# Patient Record
Sex: Male | Born: 1955
Health system: Southern US, Community
[De-identification: ages and names within clinical notes are randomized; demographics above are authoritative.]

## PROBLEM LIST (undated history)

## (undated) DIAGNOSIS — F319 Bipolar disorder, unspecified: Secondary | ICD-10-CM

## (undated) DIAGNOSIS — I2510 Atherosclerotic heart disease of native coronary artery without angina pectoris: Secondary | ICD-10-CM

## (undated) DIAGNOSIS — B958 Unspecified staphylococcus as the cause of diseases classified elsewhere: Secondary | ICD-10-CM

## (undated) DIAGNOSIS — M1990 Unspecified osteoarthritis, unspecified site: Secondary | ICD-10-CM

## (undated) DIAGNOSIS — B3323 Viral pericarditis: Secondary | ICD-10-CM

## (undated) DIAGNOSIS — F419 Anxiety disorder, unspecified: Secondary | ICD-10-CM

## (undated) DIAGNOSIS — B1920 Unspecified viral hepatitis C without hepatic coma: Secondary | ICD-10-CM

## (undated) DIAGNOSIS — J852 Abscess of lung without pneumonia: Secondary | ICD-10-CM

## (undated) DIAGNOSIS — I712 Thoracic aortic aneurysm, without rupture: Secondary | ICD-10-CM

## (undated) DIAGNOSIS — J189 Pneumonia, unspecified organism: Secondary | ICD-10-CM

## (undated) DIAGNOSIS — J869 Pyothorax without fistula: Secondary | ICD-10-CM

## (undated) DIAGNOSIS — F988 Other specified behavioral and emotional disorders with onset usually occurring in childhood and adolescence: Secondary | ICD-10-CM

## (undated) DIAGNOSIS — I10 Essential (primary) hypertension: Secondary | ICD-10-CM

## (undated) DIAGNOSIS — N412 Abscess of prostate: Secondary | ICD-10-CM

## (undated) DIAGNOSIS — I7121 Aneurysm of the ascending aorta, without rupture: Secondary | ICD-10-CM

## (undated) DIAGNOSIS — N429 Disorder of prostate, unspecified: Secondary | ICD-10-CM

## (undated) HISTORY — DX: Thoracic aortic aneurysm, without rupture: I71.2

## (undated) HISTORY — DX: Aneurysm of the ascending aorta, without rupture: I71.21

## (undated) HISTORY — DX: Atherosclerotic heart disease of native coronary artery without angina pectoris: I25.10

## (undated) HISTORY — DX: Unspecified viral hepatitis C without hepatic coma: B19.20

## (undated) HISTORY — DX: Viral pericarditis: B33.23

## (undated) HISTORY — DX: Abscess of lung without pneumonia: J85.2

## (undated) HISTORY — DX: Pyothorax without fistula: J86.9

## (undated) HISTORY — DX: Unspecified staphylococcus as the cause of diseases classified elsewhere: B95.8

## (undated) HISTORY — DX: Essential (primary) hypertension: I10

## (undated) HISTORY — DX: Bipolar disorder, unspecified: F31.9

## (undated) HISTORY — DX: Other specified behavioral and emotional disorders with onset usually occurring in childhood and adolescence: F98.8

## (undated) HISTORY — PX: HIP RESURFACING: SHX1760

## (undated) HISTORY — PX: LUNG SURGERY: SHX703

---

## 1998-02-14 ENCOUNTER — Other Ambulatory Visit: Admission: RE | Admit: 1998-02-14 | Discharge: 1998-02-14 | Payer: Self-pay | Admitting: Gastroenterology

## 1998-02-22 ENCOUNTER — Ambulatory Visit (HOSPITAL_COMMUNITY): Admission: RE | Admit: 1998-02-22 | Discharge: 1998-02-22 | Payer: Self-pay | Admitting: Gastroenterology

## 1998-06-04 ENCOUNTER — Emergency Department (HOSPITAL_COMMUNITY): Admission: EM | Admit: 1998-06-04 | Discharge: 1998-06-04 | Payer: Self-pay

## 1998-11-30 ENCOUNTER — Ambulatory Visit (HOSPITAL_COMMUNITY): Admission: RE | Admit: 1998-11-30 | Discharge: 1998-11-30 | Payer: Self-pay | Admitting: Gastroenterology

## 2000-10-20 ENCOUNTER — Ambulatory Visit (HOSPITAL_BASED_OUTPATIENT_CLINIC_OR_DEPARTMENT_OTHER): Admission: RE | Admit: 2000-10-20 | Discharge: 2000-10-20 | Payer: Self-pay | Admitting: Orthopedic Surgery

## 2000-10-20 ENCOUNTER — Encounter (INDEPENDENT_AMBULATORY_CARE_PROVIDER_SITE_OTHER): Payer: Self-pay | Admitting: *Deleted

## 2001-04-16 ENCOUNTER — Ambulatory Visit (HOSPITAL_COMMUNITY): Admission: RE | Admit: 2001-04-16 | Discharge: 2001-04-16 | Payer: Self-pay | Admitting: *Deleted

## 2001-04-16 ENCOUNTER — Encounter: Payer: Self-pay | Admitting: *Deleted

## 2001-06-08 ENCOUNTER — Encounter: Payer: Self-pay | Admitting: *Deleted

## 2001-06-08 ENCOUNTER — Ambulatory Visit (HOSPITAL_COMMUNITY): Admission: RE | Admit: 2001-06-08 | Discharge: 2001-06-08 | Payer: Self-pay | Admitting: *Deleted

## 2002-10-13 ENCOUNTER — Ambulatory Visit (HOSPITAL_COMMUNITY): Admission: RE | Admit: 2002-10-13 | Discharge: 2002-10-13 | Payer: Self-pay | Admitting: *Deleted

## 2002-10-13 ENCOUNTER — Encounter: Payer: Self-pay | Admitting: *Deleted

## 2002-10-24 ENCOUNTER — Encounter: Payer: Self-pay | Admitting: *Deleted

## 2002-10-24 ENCOUNTER — Ambulatory Visit (HOSPITAL_COMMUNITY): Admission: RE | Admit: 2002-10-24 | Discharge: 2002-10-24 | Payer: Self-pay | Admitting: *Deleted

## 2003-07-29 HISTORY — PX: ELBOW SURGERY: SHX618

## 2004-07-28 HISTORY — PX: FOOT NEUROMA SURGERY: SHX646

## 2004-08-01 ENCOUNTER — Ambulatory Visit (HOSPITAL_BASED_OUTPATIENT_CLINIC_OR_DEPARTMENT_OTHER): Admission: RE | Admit: 2004-08-01 | Discharge: 2004-08-01 | Payer: Self-pay | Admitting: Orthopedic Surgery

## 2004-08-01 ENCOUNTER — Ambulatory Visit (HOSPITAL_COMMUNITY): Admission: RE | Admit: 2004-08-01 | Discharge: 2004-08-01 | Payer: Self-pay | Admitting: Orthopedic Surgery

## 2004-08-01 ENCOUNTER — Encounter (INDEPENDENT_AMBULATORY_CARE_PROVIDER_SITE_OTHER): Payer: Self-pay | Admitting: *Deleted

## 2005-04-23 ENCOUNTER — Ambulatory Visit (HOSPITAL_COMMUNITY): Admission: RE | Admit: 2005-04-23 | Discharge: 2005-04-23 | Payer: Self-pay | Admitting: Orthopedic Surgery

## 2005-11-26 ENCOUNTER — Ambulatory Visit (HOSPITAL_COMMUNITY): Admission: RE | Admit: 2005-11-26 | Discharge: 2005-11-26 | Payer: Self-pay | Admitting: Orthopedic Surgery

## 2005-12-30 ENCOUNTER — Encounter (INDEPENDENT_AMBULATORY_CARE_PROVIDER_SITE_OTHER): Payer: Self-pay | Admitting: Specialist

## 2005-12-30 ENCOUNTER — Ambulatory Visit (HOSPITAL_BASED_OUTPATIENT_CLINIC_OR_DEPARTMENT_OTHER): Admission: RE | Admit: 2005-12-30 | Discharge: 2005-12-30 | Payer: Self-pay | Admitting: Orthopaedic Surgery

## 2007-08-03 ENCOUNTER — Ambulatory Visit (HOSPITAL_COMMUNITY): Admission: RE | Admit: 2007-08-03 | Discharge: 2007-08-03 | Payer: Self-pay | Admitting: Orthopaedic Surgery

## 2007-08-16 ENCOUNTER — Ambulatory Visit (HOSPITAL_COMMUNITY): Admission: RE | Admit: 2007-08-16 | Discharge: 2007-08-16 | Payer: Self-pay | Admitting: Orthopaedic Surgery

## 2007-09-22 ENCOUNTER — Ambulatory Visit (HOSPITAL_COMMUNITY): Admission: RE | Admit: 2007-09-22 | Discharge: 2007-09-22 | Payer: Self-pay | Admitting: Gastroenterology

## 2007-10-15 ENCOUNTER — Encounter: Admission: RE | Admit: 2007-10-15 | Discharge: 2007-10-15 | Payer: Self-pay | Admitting: Orthopedic Surgery

## 2008-04-07 ENCOUNTER — Emergency Department (HOSPITAL_COMMUNITY): Admission: EM | Admit: 2008-04-07 | Discharge: 2008-04-07 | Payer: Self-pay | Admitting: Family Medicine

## 2008-09-07 ENCOUNTER — Encounter: Admission: RE | Admit: 2008-09-07 | Discharge: 2008-09-07 | Payer: Self-pay | Admitting: Orthopedic Surgery

## 2009-11-05 ENCOUNTER — Ambulatory Visit (HOSPITAL_COMMUNITY): Admission: RE | Admit: 2009-11-05 | Discharge: 2009-11-05 | Payer: Self-pay | Admitting: Orthopaedic Surgery

## 2010-05-27 ENCOUNTER — Encounter: Admission: RE | Admit: 2010-05-27 | Discharge: 2010-05-27 | Payer: Self-pay | Admitting: Family Medicine

## 2010-06-25 ENCOUNTER — Encounter: Admission: RE | Admit: 2010-06-25 | Discharge: 2010-06-25 | Payer: Self-pay | Admitting: Obstetrics & Gynecology

## 2010-08-18 ENCOUNTER — Encounter: Payer: Self-pay | Admitting: Orthopaedic Surgery

## 2010-09-24 ENCOUNTER — Other Ambulatory Visit: Payer: Self-pay | Admitting: Family Medicine

## 2010-09-24 ENCOUNTER — Ambulatory Visit
Admission: RE | Admit: 2010-09-24 | Discharge: 2010-09-24 | Disposition: A | Discharge status: Home / Self Care | Payer: Commercial Managed Care - PPO | Source: Ambulatory Visit | Attending: Family Medicine | Admitting: Family Medicine

## 2010-09-24 DIAGNOSIS — R05 Cough: Secondary | ICD-10-CM

## 2010-09-24 DIAGNOSIS — R0602 Shortness of breath: Secondary | ICD-10-CM

## 2010-09-24 DIAGNOSIS — R059 Cough, unspecified: Secondary | ICD-10-CM

## 2010-09-25 ENCOUNTER — Ambulatory Visit
Admission: RE | Admit: 2010-09-25 | Discharge: 2010-09-25 | Disposition: A | Discharge status: Home / Self Care | Payer: Commercial Managed Care - PPO | Source: Ambulatory Visit | Attending: Family Medicine | Admitting: Family Medicine

## 2010-09-25 ENCOUNTER — Other Ambulatory Visit: Payer: Self-pay | Admitting: Family Medicine

## 2010-09-25 DIAGNOSIS — R911 Solitary pulmonary nodule: Secondary | ICD-10-CM

## 2010-10-14 ENCOUNTER — Encounter: Payer: Self-pay | Admitting: Pulmonary Disease

## 2010-10-15 ENCOUNTER — Encounter: Payer: Self-pay | Admitting: Pulmonary Disease

## 2010-10-15 ENCOUNTER — Ambulatory Visit (INDEPENDENT_AMBULATORY_CARE_PROVIDER_SITE_OTHER): Payer: Commercial Managed Care - PPO | Admitting: Pulmonary Disease

## 2010-10-15 DIAGNOSIS — J189 Pneumonia, unspecified organism: Secondary | ICD-10-CM

## 2010-10-15 DIAGNOSIS — J90 Pleural effusion, not elsewhere classified: Secondary | ICD-10-CM | POA: Insufficient documentation

## 2010-10-15 NOTE — Assessment & Plan Note (Signed)
The pt has an unusual appearing asdz abutting major fissure in superior segment RLL on ct chest.  I suspect this was pna, and will be further evaluated after a course of abx with an upcoming ct chest.  He has minimal cough or chest congestion currently, but does have a low grade temp today.

## 2010-10-15 NOTE — Patient Instructions (Signed)
Will schedule for a drainage procedure for the fluid in your right chest.  Will do scan of your chest one day after this. Will arrange followup  Visit with me to review all of this when results available

## 2010-10-15 NOTE — Assessment & Plan Note (Addendum)
The pt has a persistent right effusion that is most likely loculated and parapneumonic in nature.  He has finished up a course of levaquin a few weeks ago.  He will need u/s guided thoracentesis with labs, culture, and cytology sent.  Would like to drain the space as much as possible, then do f/u ct chest the next day to see the results.  Hopefully, this will not come to [facility].

## 2010-10-15 NOTE — Progress Notes (Signed)
Subjective:    Patient ID: Paul Ray, male    DOB: 1955/08/31, 55 y.o.   MRN: [id number]  HPI The pt is a 54y/o male who I have been asked to see for a recent pna complicated by a right pleural effusion.  He actually had an episode of ?pna the end of last year, and was treated with 3 rounds of abx before feeling well.  Approx. One month ago, he developed a dry cough with right sided chest discomfort, but no f/c/s or purulence.  He had mild sob.  He was found to have by cxr and ct chest asdz in the sup segment of RLL, along with a moderately large right effusion.  He was treated with a course of levaquin and feels about 50% better.  He still has some right sided chest pain, but minimal cough and congestion.  He denies f/c/s.  He did have recent cxr at [facility] which showed what appears to be an enlarged right effusion that is loculated.    Past Medical History  Diagnosis Date  . Hepatitis C   . ADD (attention deficit disorder)   . Bipolar depression   . Hemorrhoids     Family History  Problem Relation Age of Onset  . COPD Mother   . Heart disease Mother   . Hypertension Mother   . Coronary artery disease Father   . Dementia Father   . Heart failure Father   . Prostate cancer Paternal Grandfather     also had bone cancer  . Breast cancer Maternal Grandmother   . Cancer Paternal Grandmother     unsure what kind    History   Social History  . Marital Status: Married    Spouse Name: N/A    Number of Children: 2  . Years of Education: N/A   Occupational History  . carpenter    Social History Main Topics  . Smoking status: Never Smoker   . Smokeless tobacco: Not on file  . Alcohol Use: Not on file  . Drug Use: Not on file  . Sexually Active: Not on file   Other Topics Concern  . Not on file   Social History Narrative   2 children: Fleet Contras and [name]    No Known Allergies  Outpatient Encounter Prescriptions as of 10/15/2010  Medication Sig Dispense Refill  . b complex  vitamins tablet Take 1 tablet by mouth daily.        . divalproex (DEPAKOTE) 500 MG EC tablet Take 500 mg by mouth 4 (four) times daily.        . Glucosamine-Chondroit-Vit C-Mn (GLUCOSAMINE CHONDROITIN COMPLX) CAPS Take 2 capsules by mouth daily.        Marland Kitchen lamoTRIgine (LAMICTAL) 100 MG tablet Take 100 mg by mouth daily.        . Multiple Vitamin (MULTIVITAMIN) capsule Take 1 capsule by mouth daily.        . OMEGA-3 KRILL OIL 300 MG CAPS Take 1 capsule by mouth daily.        . traMADol (ULTRAM) 50 MG tablet Take 1 to 2 tabs three to four times a day for pain      . DISCONTD: lisdexamfetamine (VYVANSE) 60 MG capsule Take 60 mg by mouth every morning.        Marland Kitchen DISCONTD: naproxen sodium (ANAPROX) 220 MG tablet 2 1/2 tabs every 12 hours with food as needed for back pain            Review  of Systems Patient complains of productive cough, non productive cough, ear ache, and joint stiffness or pain  Patient denies shortness of breath with activity, shortness of breath at rest, coughing up blood, chest pain, irregular heartbeats, acid heartburn, indigestion, loss of appetite, weight change, abdominal pain, difficulty swallowing, sore throat, tooth/dental problems, headaches, nasal congestion, sneezing, itching, anxiety, depression, hand or feet swelling, rash, or change in color of mucus.     Objective:   Physical Exam  Constitutional: He is oriented to person, place, and time. He appears well-developed and well-nourished.  HENT:  Mouth/Throat: Oropharynx is clear and moist.       Nares with deviated septum to left, but no purulence  Eyes: EOM are normal. Pupils are equal, round, and reactive to light.  Neck: Neck supple. No JVD present. No thyromegaly present.  Cardiovascular: Normal rate, regular rhythm, normal heart sounds and intact distal pulses.  Exam reveals no gallop and no friction rub.   No murmur heard. Pulmonary/Chest:       Crackles right base, decreased bs 1/2 up on right with no e  to a changes. +dullness to percussion.   Left lung clear  Abdominal: Soft. Bowel sounds are normal. There is no tenderness.  Musculoskeletal: He exhibits no edema.  Lymphadenopathy:    He has no cervical adenopathy.  Neurological: He is alert and oriented to person, place, and time.  Skin:       No cyanosis           Assessment & Plan:  Pleural effusion The pt has a persistent right effusion that is most likely loculated and parapneumonic in nature.  He has finished up a course of levaquin a few weeks ago.  He will need u/s guided thoracentesis with labs, culture, and cytology sent.  Would like to drain the space as much as possible, then do f/u ct chest the next day to see the results.  Hopefully, this will not come to [facility].    Pneumonia, organism unspecified The pt has an unusual appearing asdz abutting major fissure in superior segment RLL on ct chest.  I suspect this was pna, and will be further evaluated after a course of abx with an upcoming ct chest.  He has minimal cough or chest congestion currently, but does have a low grade temp today.

## 2010-10-15 NOTE — Progress Notes (Deleted)
  Subjective:    Patient ID: Paul Ray, male    DOB: [birth year], 55 y.o.   MRN: [id number]  HPI    Review of Systems Patient complains of productive cough, non productive cough, ear ache, and joint stiffness or pain Patient denies shortness of breath with activity, shortness of breath at rest, coughing up blood, chest pain, irregular heartbeats, acid heartburn, indigestion, loss of appetite, weight change, abdominal pain, difficulty swallowing, sore throat, tooth/dental problems, headaches, nasal congestion, sneezing, itching, anxiety, depression, hand or feet swelling, rash, or change in color of mucus.    Objective:   Physical Exam        Assessment & Plan:

## 2010-10-17 ENCOUNTER — Ambulatory Visit (HOSPITAL_COMMUNITY)
Admission: RE | Admit: 2010-10-17 | Discharge: 2010-10-17 | Disposition: A | Discharge status: Home / Self Care | Payer: 59 | Source: Ambulatory Visit | Attending: Pulmonary Disease | Admitting: Pulmonary Disease

## 2010-10-17 ENCOUNTER — Other Ambulatory Visit: Payer: Self-pay | Admitting: Interventional Radiology

## 2010-10-17 ENCOUNTER — Other Ambulatory Visit: Payer: Self-pay | Admitting: Pulmonary Disease

## 2010-10-17 ENCOUNTER — Telehealth: Payer: Self-pay | Admitting: Pulmonary Disease

## 2010-10-17 DIAGNOSIS — R0602 Shortness of breath: Secondary | ICD-10-CM | POA: Insufficient documentation

## 2010-10-17 DIAGNOSIS — Z9889 Other specified postprocedural states: Secondary | ICD-10-CM

## 2010-10-17 DIAGNOSIS — R222 Localized swelling, mass and lump, trunk: Secondary | ICD-10-CM | POA: Insufficient documentation

## 2010-10-17 DIAGNOSIS — J90 Pleural effusion, not elsewhere classified: Secondary | ICD-10-CM | POA: Insufficient documentation

## 2010-10-17 LAB — LACTATE DEHYDROGENASE, PLEURAL OR PERITONEAL FLUID: LD, Fluid: 641 U/L — ABNORMAL HIGH (ref 3–23)

## 2010-10-17 LAB — PROTEIN, BODY FLUID: Total protein, fluid: 4.7 g/dL

## 2010-10-17 LAB — PATHOLOGIST SMEAR REVIEW

## 2010-10-17 LAB — BODY FLUID CELL COUNT WITH DIFFERENTIAL
Eos, Fluid: 66 %
Lymphs, Fluid: 14 %
Monocyte-Macrophage-Serous Fluid: 8 % — ABNORMAL LOW (ref 50–90)
Neutrophil Count, Fluid: 12 % (ref 0–25)
Total Nucleated Cell Count, Fluid: 11163 cu mm — ABNORMAL HIGH (ref 0–1000)

## 2010-10-17 LAB — GLUCOSE, SEROUS FLUID: Glucose, Fluid: 82 mg/dL

## 2010-10-17 NOTE — Telephone Encounter (Signed)
Called and spoke with pt.  Pt states he had thoracentesis done today and is requesting these results.  Informed pt KC out of office this afternoon and will return tomorrow. Will forward message to [facility] to address.

## 2010-10-18 ENCOUNTER — Telehealth: Payer: Self-pay | Admitting: Pulmonary Disease

## 2010-10-18 ENCOUNTER — Ambulatory Visit (HOSPITAL_COMMUNITY)
Admission: RE | Admit: 2010-10-18 | Discharge: 2010-10-18 | Disposition: A | Discharge status: Home / Self Care | Payer: 59 | Source: Ambulatory Visit | Attending: Pulmonary Disease | Admitting: Pulmonary Disease

## 2010-10-18 DIAGNOSIS — J90 Pleural effusion, not elsewhere classified: Secondary | ICD-10-CM | POA: Insufficient documentation

## 2010-10-18 DIAGNOSIS — R222 Localized swelling, mass and lump, trunk: Secondary | ICD-10-CM | POA: Insufficient documentation

## 2010-10-18 NOTE — Telephone Encounter (Signed)
Spoke with Rebecka Apley from [facility].  She states she is needing the order signed by [facility] for pt to get his CT scan done.  Discussed this with Bjorn Loser.  She refaxed the order back to [facility] with KC stamped signature.

## 2010-10-18 NOTE — Telephone Encounter (Signed)
Paul Ray, pt is requesting results of his ct scan.  Do you have these results?  Please advise. Thanks

## 2010-10-18 NOTE — Telephone Encounter (Signed)
Pt called again, states that's he still hurting, coughing a lot, chest hurts, coughing up brownish phlegm since last night also requests ct results from yesterday.

## 2010-10-18 NOTE — Telephone Encounter (Signed)
KC, pt is calling requesting results of his ct scan that was done today.  Pt would also like results of thoracentesis.  Please advise.  Thank you.

## 2010-10-18 NOTE — Telephone Encounter (Signed)
Already done

## 2010-10-18 NOTE — Telephone Encounter (Signed)
Done

## 2010-10-21 LAB — BODY FLUID CULTURE: Culture: NO GROWTH

## 2010-10-22 ENCOUNTER — Ambulatory Visit (INDEPENDENT_AMBULATORY_CARE_PROVIDER_SITE_OTHER)
Admission: RE | Admit: 2010-10-22 | Discharge: 2010-10-22 | Disposition: A | Discharge status: Home / Self Care | Payer: Commercial Managed Care - PPO | Source: Ambulatory Visit | Attending: Pulmonary Disease | Admitting: Pulmonary Disease

## 2010-10-22 ENCOUNTER — Ambulatory Visit (INDEPENDENT_AMBULATORY_CARE_PROVIDER_SITE_OTHER): Payer: Commercial Managed Care - PPO | Admitting: Pulmonary Disease

## 2010-10-22 ENCOUNTER — Ambulatory Visit: Payer: Commercial Managed Care - PPO | Admitting: Critical Care Medicine

## 2010-10-22 ENCOUNTER — Telehealth: Payer: Self-pay | Admitting: Pulmonary Disease

## 2010-10-22 ENCOUNTER — Encounter: Payer: Self-pay | Admitting: Pulmonary Disease

## 2010-10-22 DIAGNOSIS — J90 Pleural effusion, not elsewhere classified: Secondary | ICD-10-CM

## 2010-10-22 DIAGNOSIS — R918 Other nonspecific abnormal finding of lung field: Secondary | ICD-10-CM | POA: Insufficient documentation

## 2010-10-22 DIAGNOSIS — R222 Localized swelling, mass and lump, trunk: Secondary | ICD-10-CM

## 2010-10-22 MED ORDER — AMOXICILLIN-POT CLAVULANATE 875-125 MG PO TABS: 1.0000 | ORAL_TABLET | Freq: Two times a day (BID) | ORAL | Status: DC

## 2010-10-22 MED ORDER — HYDROCODONE-ACETAMINOPHEN 5-500 MG PO TABS: 1.0000 | ORAL_TABLET | Freq: Four times a day (QID) | ORAL | Status: DC | PRN

## 2010-10-22 NOTE — Patient Instructions (Signed)
Take augmentin as directed until next visit vicodin for pain as needed Please call if not improving over the next 7days followup with me in 3 weeks.

## 2010-10-22 NOTE — Telephone Encounter (Signed)
Called and spoke with pt and she states he has had a terrible cough all night w/ brown to yellow phlem. Pt states his chest has been hurting him all night as well. Pt is coming in to see PW at 2:00 to be evaluated today. Pt states he will call if he can't make it in today. Pt also wanting his ct results and states he is waiting to here back on the cultures that was done on the fluid that was drawn off. Please advise Dr. Shelle Iron thanks  Carver Fila, CMA

## 2010-10-22 NOTE — Telephone Encounter (Signed)
Patient stated that if he isn't home he can be reached on his cell [phone]. He also wanted to know if we had the results of his CT and other test..Susan Romeo Apple

## 2010-10-22 NOTE — Assessment & Plan Note (Signed)
The pt has a persistent mass like density on chest ct that is parenchymal, but next to fissure and chest wall.  I suspect this is a lung abscess given his persistent purulent mucus and pleural fluid findings, but I cannot 100% exclude a malignancy.  Would like to treat with augmentin for next 3 weeks to see if things improve.

## 2010-10-22 NOTE — Telephone Encounter (Signed)
Cancelled apt with pw and pt is coming in to see KC at 2:00 instead  Carver Fila, CMA

## 2010-10-22 NOTE — Progress Notes (Signed)
  Subjective:    Patient ID: Paul Ray, male    DOB: 07/17/1956, 55 y.o.   MRN: [id number]  HPI The pt comes in today for f/u of his pleural effusion with abnormal parenchymal density as well.  He is s/p one liter thoracentesis with good drainage, and fluid was very inflammatory with negative culture and path.  His f/u ct chest showed minimal remaining fluid, but no change in his parenchymal density.  He began to have more pleuritic cp after his thoracentesis, but his cxr did not show ptx.  He has continued to have discomfort, most likely due to inflammatory pleuritis after draining "buffer".  He has not had fever, but has had subjective chills and sweats.  He is coughing some, with discolored mucus.  He thinks he is getting more congested.    Review of Systems  Constitutional: Positive for chills and appetite change. Negative for fever and unexpected weight change.  HENT: Positive for congestion. Negative for ear pain, sore throat, rhinorrhea, sneezing, trouble swallowing, dental problem and postnasal drip.   Eyes: Negative for redness.  Respiratory: Positive for cough and shortness of breath. Negative for wheezing.   Cardiovascular: Positive for chest pain. Negative for palpitations and leg swelling.  Gastrointestinal: Negative for nausea, abdominal pain and diarrhea.  Genitourinary: Negative for dysuria.  Musculoskeletal: Positive for joint swelling.  Skin: Negative for rash.  Neurological: Negative for syncope and headaches.  Hematological: Does not bruise/bleed easily.  Psychiatric/Behavioral: Negative for dysphoric mood. The patient is not nervous/anxious.        Objective:   Physical Exam Wd male in nad No purulence or discharge from nares No jvd, LN Chest with decreased bs on right with a few crackles. Cor with rrr No LE edema, no cyanosis  Alert, moves all 4 extrem.       Assessment & Plan:

## 2010-10-22 NOTE — Assessment & Plan Note (Addendum)
The pt is having significant pleuritic pain probably related to an inflammatory pleuritis after thoracentesis.  Will recheck a cxr today, and treat with analgesics for pain control.  His f/u ct chest post thoracentesis showed very little fluid remaining, and will follow for now.

## 2010-10-23 ENCOUNTER — Telehealth: Payer: Self-pay | Admitting: Pulmonary Disease

## 2010-10-23 NOTE — Telephone Encounter (Signed)
Spoke with pt and he states he saw Dr. Shelle Iron yesterday and was given Vicodin 5/500 and pt states that is not even touching his pain. Pt states he cough has gotten progressively worse and he is coughing so bad it is making her chest and back hurt bad. Pt is taking his augmentin twice a day.  Pt also states he is having dry mouth as well. Dr. Shelle Iron please advise thanks  No Known Allergies  Carver Fila, CMA

## 2010-10-23 NOTE — Telephone Encounter (Signed)
Called spoke with patient, informed him of KC's recs as stated above.  Pt states that he does not want to go the hosp, but that his pain is indeed worse since yesterday and the vicodin is not helping.  Pt asking what he can do in the meantime > i advised vicodin and the augmentin as prescribed.  Pt did not seem satisfied with this.  Pt would like to know if the hospital admission would just be an ER visit or if he would have to stay over night.  I advised pt that an actual admission may be in order, but he is requesting KC's thoughts on this and any other recs.  Please advise.

## 2010-10-23 NOTE — Telephone Encounter (Signed)
Let him know the abx should help his infection, and help reduce fluid in his chest.  If he feels his pain is no better with vicodan, may need admission for pain control although the ER may decide to treat him as an outpt.  If he doesn't go to ER, will need a cxr here as I have documented in other notes.

## 2010-10-23 NOTE — Telephone Encounter (Signed)
We have just started him on therapy.  The only other option at this point is hospital admission for IV abx, drainage again of his fluid.  Let me know if he wishes to do this.  He also needs to let us know if worsens.

## 2010-10-23 NOTE — Telephone Encounter (Signed)
Called and spoke with pt.  Pt aware of KC's recs.  Pt states he is going to go to the ER for pain control as he states the vicodin isn't helping with the pain.

## 2010-10-28 ENCOUNTER — Telehealth: Payer: Self-pay | Admitting: Pulmonary Disease

## 2010-10-28 ENCOUNTER — Ambulatory Visit: Payer: Commercial Managed Care - PPO | Admitting: Internal Medicine

## 2010-10-28 NOTE — Telephone Encounter (Signed)
Patient phoned stated that he was returning a call to Triage he can be reached at [phone].Vedia Coffer

## 2010-10-28 NOTE — Telephone Encounter (Signed)
Pt states he is having some rib pain, cough, wheezing, sob. Pt req to see Dr. Shelle Iron but advised pt he is not in the office. Advised pt then he needed to go to the ED to be evaluated but refused bc he states he can't afford to go every time we rec he go. Pt did not go the ED as rec on Friday when pt called in.  Pt is coming in to see MR today at 1:30

## 2010-10-28 NOTE — Telephone Encounter (Signed)
lmomtcb x1 

## 2010-10-31 ENCOUNTER — Encounter: Payer: Self-pay | Admitting: Pulmonary Disease

## 2010-10-31 ENCOUNTER — Ambulatory Visit (INDEPENDENT_AMBULATORY_CARE_PROVIDER_SITE_OTHER)
Admission: RE | Admit: 2010-10-31 | Discharge: 2010-10-31 | Disposition: A | Discharge status: Home / Self Care | Payer: Commercial Managed Care - PPO | Source: Ambulatory Visit | Attending: Pulmonary Disease | Admitting: Pulmonary Disease

## 2010-10-31 ENCOUNTER — Ambulatory Visit (INDEPENDENT_AMBULATORY_CARE_PROVIDER_SITE_OTHER): Payer: Commercial Managed Care - PPO | Admitting: Pulmonary Disease

## 2010-10-31 DIAGNOSIS — R222 Localized swelling, mass and lump, trunk: Secondary | ICD-10-CM

## 2010-10-31 DIAGNOSIS — R918 Other nonspecific abnormal finding of lung field: Secondary | ICD-10-CM

## 2010-10-31 DIAGNOSIS — J90 Pleural effusion, not elsewhere classified: Secondary | ICD-10-CM

## 2010-10-31 NOTE — Assessment & Plan Note (Addendum)
The pt had mild increase in pleural fluid last visit, and the hope was that treatment with abx would help with this.  His effusion is exudative, very inflammatory and needs to be watched very carefully.  Will check cxr today, and if increased, may need to have repeat thoracentesis vs tube thoracostomy.

## 2010-10-31 NOTE — Patient Instructions (Signed)
Continue with antibiotics Will check cxr today ,and will call you this afternoon with results

## 2010-10-31 NOTE — Progress Notes (Signed)
  Subjective:    Patient ID: Paul Ray, male    DOB: Dec 24, 1955, 55 y.o.   MRN: [id number]  HPI The pt comes in today for f/u of his mass like density right lung that is felt to be a lung abscess, as well as right effusion.  At the last visit, he was started on augmentin for possible lung abscess, and a f/u cxr showed some increase in pleural effusion. The decision was made to follow this with the addition of abx, and see how he did.   He is much better since last visit, with decreased chest pain, and decrease in cough and mucus.  He denies any f/c/s. He has minimal purulence in his mucus at this time.    Review of Systems  Constitutional: Positive for appetite change. Negative for fever and unexpected weight change.  HENT: Positive for congestion, sore throat and trouble swallowing. Negative for ear pain, rhinorrhea, sneezing, dental problem and postnasal drip.   Eyes: Negative for redness.  Respiratory: Positive for cough, shortness of breath and wheezing.   Cardiovascular: Positive for chest pain and leg swelling. Negative for palpitations.  Gastrointestinal: Negative for nausea, abdominal pain and diarrhea.  Genitourinary: Negative for dysuria.  Musculoskeletal: Positive for joint swelling.  Skin: Negative for rash.  Neurological: Negative for syncope and headaches.  Hematological: Does not bruise/bleed easily.  Psychiatric/Behavioral: Positive for dysphoric mood. The patient is nervous/anxious.        Objective:   Physical Exam Well developed male in nad Nares patent with discharge Chest with decreased bs in right base with dullness to percussion Cor with rrr No edema or cyanosis  Alert and oriented, moves all 4         Assessment & Plan:

## 2010-11-02 ENCOUNTER — Encounter: Payer: Self-pay | Admitting: Pulmonary Disease

## 2010-11-02 NOTE — Assessment & Plan Note (Signed)
The pt has a RLL mass like density that I suspect is a lung abscess.  He is a never smoker, but he understands that I cannot 100% exclude the possibility of cancer.  He has clearly improved since being on augmentin, and I would like him to finish a three week course of treatment.  Obviously, we need to keep a very close eye on this, and have a low threshold for biopsy if he does not have improvement after abx course.

## 2010-11-04 ENCOUNTER — Telehealth: Payer: Self-pay | Admitting: Pulmonary Disease

## 2010-11-04 DIAGNOSIS — J90 Pleural effusion, not elsewhere classified: Secondary | ICD-10-CM

## 2010-11-04 NOTE — Telephone Encounter (Signed)
Discussed with pt.  He wishes to consider surgical options.  Will refer to [facility]ney for evaluation

## 2010-11-04 NOTE — Telephone Encounter (Signed)
Dr. Shelle Iron, pt is returning your call and requesting you call him back ASAP.

## 2010-11-05 NOTE — Telephone Encounter (Signed)
Per [facility] order has been placed and phone note complete.

## 2010-11-06 ENCOUNTER — Telehealth: Payer: Self-pay | Admitting: Pulmonary Disease

## 2010-11-06 ENCOUNTER — Encounter (INDEPENDENT_AMBULATORY_CARE_PROVIDER_SITE_OTHER): Payer: Commercial Managed Care - PPO | Admitting: Thoracic Surgery

## 2010-11-06 DIAGNOSIS — R918 Other nonspecific abnormal finding of lung field: Secondary | ICD-10-CM

## 2010-11-06 DIAGNOSIS — D491 Neoplasm of unspecified behavior of respiratory system: Secondary | ICD-10-CM

## 2010-11-06 MED ORDER — AMOXICILLIN-POT CLAVULANATE 875-125 MG PO TABS: ORAL_TABLET | ORAL | Status: DC

## 2010-11-06 NOTE — Telephone Encounter (Signed)
lmomtcb x1 to advise pt rx was sent to pharmacy 

## 2010-11-06 NOTE — Telephone Encounter (Signed)
Ok to call in 6 more augmentin at same dose.

## 2010-11-06 NOTE — Telephone Encounter (Signed)
Spoke w/ pt and he states Dr. Edwyna Shell wants him to take Augmentin 875-125 mg 1 tablet bid up until the day of his surgery. Pt states he is having 7-8% of his right lung removed on 4/19. Pt states he only has 11 tablets left of his Augmentin and needs extra tablets to get him up to his surgery date. Dr. Shelle Iron please advise. Thanks  Carver Fila, CMA

## 2010-11-07 NOTE — Telephone Encounter (Signed)
lmomtcb x1 

## 2010-11-07 NOTE — Telephone Encounter (Signed)
No.  We can cancel his apptm and I will see him in the hospital

## 2010-11-07 NOTE — Letter (Signed)
November 06, 2010  Barbaraann Share, MD,FCCP [address] [place], [place] [address]  Re:  DISHON, KEHOE             DOB:  July 24, 1956  Dear Mellody Dance:  I appreciate the opportunity of seeing the patient.  This patient was first seen by you in March with a right lower lobe superior segmental lesion.  He had had some fever, chills, some weight loss, and right chest pain.  A chest CT scan showed a lesion in his right middle lobe with effusion and underwent removal of bloody effusion which grew no organisms.  Unfortunately, within about a week, the effusion recurred and it was more loculated and still had the superior segmental lesion in the right lower lobe.  It was thought this could be an inflammatory lesion.  His ascending aorta also measures 5.1 cm.  He is referred here for evaluation.  He is presently on antibiotics, amoxicillin.  MEDICATIONS:  Vitamins, Lamisil, Depakote 200 mg at night, tramadol, and Vyvanse 60 mg at night.  He has no allergies.  PAST MEDICAL HISTORY:  He has anxiety disorder.  FAMILY HISTORY:  Noncontributory.  SOCIAL HISTORY:  He is married.  He has 2 children.  He works as a Music therapist.  Does not smoke.  Drinks alcohol on a regular basis.  His wife works at [employer] in placement.  REVIEW OF SYSTEMS:  He is 275 pounds.  He is 5 feet 9 inches and he had some weight loss. CARDIAC:  No angina or atrial fibrillation.  He has had some chest tightness. PULMONARY:  Wheezing.  No hemoptysis. GI:  No nausea, vomiting, constipation, or diarrhea. GU:  No frequent urination.  No kidney disease. VASCULAR:  No claudication, DVT, or TIAs. NEUROLOGICAL:  No dizziness, headaches, blackouts, or seizures. MUSCULOSKELETAL:  Arthritis. PSYCHIATRIC:  Anxiety disorder. HEENT:  No change in eyesight or hearing. HEMATOLOGIC:  No problems with bleeding, clotting disorders, or anemia.  PHYSICAL EXAMINATION:  GENERAL:  He is a well-developed Caucasian male in no acute distress.   He appears to be very anxious and even hyperactive.  VITAL SIGNS:  His blood pressure is 122/80, pulse 92, respirations 20, and saturations were 95%.  HEAD, EYES, EARS, NOSE, AND THROAT:  Unremarkable.  NECK:  Supple without thyromegaly.  CHEST: There is decreased breath sounds on the right.  No rub.  Normal breath sounds on the left.  HEART:  Regular sinus rhythm.  No murmurs. ABDOMEN:  Soft.  There is no splenomegaly.  EXTREMITIES:  Pulses 2+. There is no clubbing or edema.  NEUROLOGIC:  He is oriented x3.  Sensory and motor intact.  Cranial nerves intact.  This is a very difficult case.  It looks like this is inflammatory but with a bloody effusion, we cannot rule out cancer.  I think the best option is to proceed with a VATS approach on this and drain his effusion, send this for culture and pleural biopsies and if need be we will either biopsy or resect the right lower lobe superior segmental mass.  I have discuss this with him and he agrees to the surgery.  I appreciate the opportunity.  I will continue on antibiotics until the surgery.  I would schedule this for April 19 at [facility].  Ines Bloomer, M.D. Electronically Signed  DPB/MEDQ  D:  11/06/2010  T:  11/07/2010  Job:  [id number]

## 2010-11-07 NOTE — Telephone Encounter (Signed)
Spoke w/ pt and advised him Dr. Shelle Iron will just see him in the hospital and we will cancel apt for Monday. Pt verbalized understanding

## 2010-11-07 NOTE — Telephone Encounter (Signed)
Spoke w/ pt and advised him the extra tablets were sent to pharmacy. Pt wants to know if he has to follow up w/ KC on Monday 4/16. Pt states his surgery is scheduled for 11/14/10 and can't afford all these co pay's he has to pay. Please advise Dr. Shelle Iron. Thanks

## 2010-11-07 NOTE — Telephone Encounter (Signed)
Patient phoned stated that he was returning a call to triage he can be reached at [phone].Vedia Coffer

## 2010-11-11 ENCOUNTER — Encounter (HOSPITAL_COMMUNITY)
Admission: RE | Admit: 2010-11-11 | Discharge: 2010-11-11 | Disposition: A | Discharge status: Home / Self Care | Payer: 59 | Source: Ambulatory Visit | Attending: Thoracic Surgery | Admitting: Thoracic Surgery

## 2010-11-11 ENCOUNTER — Ambulatory Visit (HOSPITAL_COMMUNITY)
Admission: RE | Admit: 2010-11-11 | Discharge: 2010-11-11 | Disposition: A | Discharge status: Home / Self Care | Payer: 59 | Source: Ambulatory Visit | Attending: Thoracic Surgery | Admitting: Thoracic Surgery

## 2010-11-11 ENCOUNTER — Ambulatory Visit: Payer: Commercial Managed Care - PPO | Admitting: Pulmonary Disease

## 2010-11-11 ENCOUNTER — Other Ambulatory Visit: Payer: Self-pay | Admitting: Thoracic Surgery

## 2010-11-11 DIAGNOSIS — J984 Other disorders of lung: Secondary | ICD-10-CM | POA: Insufficient documentation

## 2010-11-11 DIAGNOSIS — R079 Chest pain, unspecified: Secondary | ICD-10-CM | POA: Insufficient documentation

## 2010-11-11 DIAGNOSIS — S2190XA Unspecified open wound of unspecified part of thorax, initial encounter: Secondary | ICD-10-CM | POA: Insufficient documentation

## 2010-11-11 DIAGNOSIS — R918 Other nonspecific abnormal finding of lung field: Secondary | ICD-10-CM

## 2010-11-11 DIAGNOSIS — Z01812 Encounter for preprocedural laboratory examination: Secondary | ICD-10-CM | POA: Insufficient documentation

## 2010-11-11 DIAGNOSIS — Z01810 Encounter for preprocedural cardiovascular examination: Secondary | ICD-10-CM | POA: Insufficient documentation

## 2010-11-11 DIAGNOSIS — Z01818 Encounter for other preprocedural examination: Secondary | ICD-10-CM | POA: Insufficient documentation

## 2010-11-11 DIAGNOSIS — J90 Pleural effusion, not elsewhere classified: Secondary | ICD-10-CM | POA: Insufficient documentation

## 2010-11-11 LAB — COMPREHENSIVE METABOLIC PANEL
ALT: 8 U/L (ref 0–53)
AST: 22 U/L (ref 0–37)
Albumin: 2.8 g/dL — ABNORMAL LOW (ref 3.5–5.2)
Alkaline Phosphatase: 72 U/L (ref 39–117)
BUN: 11 mg/dL (ref 6–23)
CO2: 28 mEq/L (ref 19–32)
Calcium: 8.9 mg/dL (ref 8.4–10.5)
Chloride: 101 mEq/L (ref 96–112)
Creatinine, Ser: 0.62 mg/dL (ref 0.4–1.5)
GFR calc Af Amer: >60 mL/min (ref >60)
GFR calc non Af Amer: >60 mL/min (ref >60)
Glucose, Bld: 92 mg/dL (ref 70–99)
Potassium: 4.9 mEq/L (ref 3.5–5.1)
Sodium: 135 mEq/L (ref 135–145)
Total Bilirubin: 0.4 mg/dL (ref 0.3–1.2)
Total Protein: 6.6 g/dL (ref 6.0–8.3)

## 2010-11-11 LAB — BLOOD GAS, ARTERIAL
Acid-Base Excess: 5.5 mmol/L — ABNORMAL HIGH (ref 0.0–2.0)
Bicarbonate: 29.2 mEq/L — ABNORMAL HIGH (ref 20.0–24.0)
Drawn by: 206361
FIO2: 0.21 %
O2 Saturation: 97.9 %
Patient temperature: 98.6
TCO2: 30.4 mmol/L (ref 0–100)
pCO2 arterial: 40.1 mmHg (ref 35.0–45.0)
pH, Arterial: 7.475 — ABNORMAL HIGH (ref 7.350–7.450)
pO2, Arterial: 88 mmHg (ref 80.0–100.0)

## 2010-11-11 LAB — URINALYSIS, ROUTINE W REFLEX MICROSCOPIC
Bilirubin Urine: NEGATIVE
Glucose, UA: NEGATIVE mg/dL
Hgb urine dipstick: NEGATIVE
Ketones, ur: NEGATIVE mg/dL
Nitrite: NEGATIVE
Protein, ur: NEGATIVE mg/dL
Specific Gravity, Urine: 1.016 (ref 1.005–1.030)
Urobilinogen, UA: 0.2 mg/dL (ref 0.0–1.0)
pH: 7.5 (ref 5.0–8.0)

## 2010-11-11 LAB — CBC
HCT: 34.7 % — ABNORMAL LOW (ref 39.0–52.0)
Hemoglobin: 11.7 g/dL — ABNORMAL LOW (ref 13.0–17.0)
MCH: 27.8 pg (ref 26.0–34.0)
MCHC: 33.7 g/dL (ref 30.0–36.0)
MCV: 82.4 fL (ref 78.0–100.0)
Platelets: 223 10*3/uL (ref 150–400)
RBC: 4.21 MIL/uL — ABNORMAL LOW (ref 4.22–5.81)
RDW: 13.8 % (ref 11.5–15.5)
WBC: 8.6 10*3/uL (ref 4.0–10.5)

## 2010-11-11 LAB — BILIRUBIN, DIRECT: Bilirubin, Direct: <0.1 mg/dL (ref 0.0–0.3)

## 2010-11-11 LAB — SURGICAL PCR SCREEN
MRSA, PCR: NEGATIVE
Staphylococcus aureus: NEGATIVE

## 2010-11-11 LAB — APTT: aPTT: 32 seconds (ref 24–37)

## 2010-11-11 LAB — PROTIME-INR
INR: 1.02 (ref 0.00–1.49)
Prothrombin Time: 13.6 seconds (ref 11.6–15.2)

## 2010-11-11 LAB — ABO/RH: ABO/RH(D): O POS

## 2010-11-12 ENCOUNTER — Other Ambulatory Visit (HOSPITAL_COMMUNITY): Payer: Commercial Managed Care - PPO

## 2010-11-14 ENCOUNTER — Inpatient Hospital Stay (HOSPITAL_COMMUNITY)
Admission: RE | Admit: 2010-11-14 | Discharge: 2010-11-20 | DRG: 163 | Disposition: A | Discharge status: Home / Self Care | Payer: 59 | Source: Ambulatory Visit | Attending: Thoracic Surgery | Admitting: Thoracic Surgery

## 2010-11-14 ENCOUNTER — Inpatient Hospital Stay (HOSPITAL_COMMUNITY): Payer: 59

## 2010-11-14 ENCOUNTER — Other Ambulatory Visit: Payer: Self-pay | Admitting: Thoracic Surgery

## 2010-11-14 DIAGNOSIS — F411 Generalized anxiety disorder: Secondary | ICD-10-CM | POA: Diagnosis present

## 2010-11-14 DIAGNOSIS — K5900 Constipation, unspecified: Secondary | ICD-10-CM | POA: Diagnosis present

## 2010-11-14 DIAGNOSIS — J852 Abscess of lung without pneumonia: Principal | ICD-10-CM | POA: Diagnosis present

## 2010-11-14 DIAGNOSIS — I712 Thoracic aortic aneurysm, without rupture, unspecified: Secondary | ICD-10-CM | POA: Diagnosis present

## 2010-11-14 DIAGNOSIS — J90 Pleural effusion, not elsewhere classified: Secondary | ICD-10-CM | POA: Diagnosis present

## 2010-11-14 DIAGNOSIS — B1920 Unspecified viral hepatitis C without hepatic coma: Secondary | ICD-10-CM | POA: Diagnosis present

## 2010-11-14 DIAGNOSIS — J869 Pyothorax without fistula: Secondary | ICD-10-CM | POA: Diagnosis present

## 2010-11-14 DIAGNOSIS — Z01810 Encounter for preprocedural cardiovascular examination: Secondary | ICD-10-CM

## 2010-11-14 DIAGNOSIS — F17200 Nicotine dependence, unspecified, uncomplicated: Secondary | ICD-10-CM | POA: Diagnosis present

## 2010-11-14 DIAGNOSIS — Z01812 Encounter for preprocedural laboratory examination: Secondary | ICD-10-CM

## 2010-11-14 HISTORY — PX: THORACOTOMY: SUR1349

## 2010-11-14 LAB — CULTURE, ROUTINE-ABSCESS

## 2010-11-15 ENCOUNTER — Inpatient Hospital Stay (HOSPITAL_COMMUNITY): Payer: 59

## 2010-11-15 DIAGNOSIS — B9689 Other specified bacterial agents as the cause of diseases classified elsewhere: Secondary | ICD-10-CM

## 2010-11-15 DIAGNOSIS — J90 Pleural effusion, not elsewhere classified: Secondary | ICD-10-CM

## 2010-11-15 DIAGNOSIS — J852 Abscess of lung without pneumonia: Secondary | ICD-10-CM

## 2010-11-15 LAB — BASIC METABOLIC PANEL
BUN: 8 mg/dL (ref 6–23)
CO2: 29 mEq/L (ref 19–32)
Calcium: 8 mg/dL — ABNORMAL LOW (ref 8.4–10.5)
Chloride: 99 mEq/L (ref 96–112)
Creatinine, Ser: 0.45 mg/dL (ref 0.4–1.5)
GFR calc Af Amer: >60 mL/min (ref >60)
GFR calc non Af Amer: >60 mL/min (ref >60)
Glucose, Bld: 125 mg/dL — ABNORMAL HIGH (ref 70–99)
Potassium: 3.6 mEq/L (ref 3.5–5.1)
Sodium: 133 mEq/L — ABNORMAL LOW (ref 135–145)

## 2010-11-15 LAB — POCT I-STAT 3, ART BLOOD GAS (G3+)
Acid-Base Excess: 6 mmol/L — ABNORMAL HIGH (ref 0.0–2.0)
Bicarbonate: 31.5 mEq/L — ABNORMAL HIGH (ref 20.0–24.0)
O2 Saturation: 95 %
Patient temperature: 97.9
TCO2: 33 mmol/L (ref 0–100)
pCO2 arterial: 48.2 mmHg — ABNORMAL HIGH (ref 35.0–45.0)
pH, Arterial: 7.421 (ref 7.350–7.450)
pO2, Arterial: 75 mmHg — ABNORMAL LOW (ref 80.0–100.0)

## 2010-11-15 LAB — CBC
HCT: 34.2 % — ABNORMAL LOW (ref 39.0–52.0)
Hemoglobin: 11.4 g/dL — ABNORMAL LOW (ref 13.0–17.0)
MCH: 27.2 pg (ref 26.0–34.0)
MCHC: 33.3 g/dL (ref 30.0–36.0)
MCV: 81.6 fL (ref 78.0–100.0)
Platelets: 206 10*3/uL (ref 150–400)
RBC: 4.19 MIL/uL — ABNORMAL LOW (ref 4.22–5.81)
RDW: 14.2 % (ref 11.5–15.5)
WBC: 10.6 10*3/uL — ABNORMAL HIGH (ref 4.0–10.5)

## 2010-11-16 ENCOUNTER — Inpatient Hospital Stay (HOSPITAL_COMMUNITY): Payer: 59

## 2010-11-16 LAB — CBC
HCT: 35.1 % — ABNORMAL LOW (ref 39.0–52.0)
Hemoglobin: 11.6 g/dL — ABNORMAL LOW (ref 13.0–17.0)
MCH: 27.2 pg (ref 26.0–34.0)
MCHC: 33 g/dL (ref 30.0–36.0)
MCV: 82.2 fL (ref 78.0–100.0)
Platelets: 221 10*3/uL (ref 150–400)
RBC: 4.27 MIL/uL (ref 4.22–5.81)
RDW: 14.7 % (ref 11.5–15.5)
WBC: 11.1 10*3/uL — ABNORMAL HIGH (ref 4.0–10.5)

## 2010-11-16 LAB — COMPREHENSIVE METABOLIC PANEL
ALT: 10 U/L (ref 0–53)
AST: 28 U/L (ref 0–37)
Albumin: 2.3 g/dL — ABNORMAL LOW (ref 3.5–5.2)
Alkaline Phosphatase: 63 U/L (ref 39–117)
BUN: 4 mg/dL — ABNORMAL LOW (ref 6–23)
CO2: 32 mEq/L (ref 19–32)
Calcium: 8.5 mg/dL (ref 8.4–10.5)
Chloride: 94 mEq/L — ABNORMAL LOW (ref 96–112)
Creatinine, Ser: 0.56 mg/dL (ref 0.4–1.5)
GFR calc Af Amer: >60 mL/min (ref >60)
GFR calc non Af Amer: >60 mL/min (ref >60)
Glucose, Bld: 111 mg/dL — ABNORMAL HIGH (ref 70–99)
Potassium: 4.3 mEq/L (ref 3.5–5.1)
Sodium: 132 mEq/L — ABNORMAL LOW (ref 135–145)
Total Bilirubin: 0.4 mg/dL (ref 0.3–1.2)
Total Protein: 6.3 g/dL (ref 6.0–8.3)

## 2010-11-16 LAB — TYPE AND SCREEN
ABO/RH(D): O POS
Antibody Screen: NEGATIVE
Unit division: 0
Unit division: 0

## 2010-11-17 ENCOUNTER — Inpatient Hospital Stay (HOSPITAL_COMMUNITY): Payer: 59

## 2010-11-17 LAB — CBC
HCT: 30.7 % — ABNORMAL LOW (ref 39.0–52.0)
Hemoglobin: 10.4 g/dL — ABNORMAL LOW (ref 13.0–17.0)
MCH: 27.6 pg (ref 26.0–34.0)
MCHC: 33.9 g/dL (ref 30.0–36.0)
MCV: 81.4 fL (ref 78.0–100.0)
Platelets: 208 10*3/uL (ref 150–400)
RBC: 3.77 MIL/uL — ABNORMAL LOW (ref 4.22–5.81)
RDW: 14.5 % (ref 11.5–15.5)
WBC: 9.4 10*3/uL (ref 4.0–10.5)

## 2010-11-17 LAB — BASIC METABOLIC PANEL
BUN: 8 mg/dL (ref 6–23)
CO2: 33 mEq/L — ABNORMAL HIGH (ref 19–32)
Calcium: 8.5 mg/dL (ref 8.4–10.5)
Chloride: 91 mEq/L — ABNORMAL LOW (ref 96–112)
Creatinine, Ser: 0.44 mg/dL (ref 0.4–1.5)
GFR calc Af Amer: >60 mL/min (ref >60)
GFR calc non Af Amer: >60 mL/min (ref >60)
Glucose, Bld: 98 mg/dL (ref 70–99)
Potassium: 3.9 mEq/L (ref 3.5–5.1)
Sodium: 132 mEq/L — ABNORMAL LOW (ref 135–145)

## 2010-11-18 ENCOUNTER — Inpatient Hospital Stay (HOSPITAL_COMMUNITY): Payer: 59

## 2010-11-18 DIAGNOSIS — B9689 Other specified bacterial agents as the cause of diseases classified elsewhere: Secondary | ICD-10-CM

## 2010-11-18 DIAGNOSIS — J852 Abscess of lung without pneumonia: Secondary | ICD-10-CM

## 2010-11-18 DIAGNOSIS — J90 Pleural effusion, not elsewhere classified: Secondary | ICD-10-CM

## 2010-11-18 LAB — BASIC METABOLIC PANEL
BUN: 12 mg/dL (ref 6–23)
CO2: 33 mEq/L — ABNORMAL HIGH (ref 19–32)
Calcium: 8.7 mg/dL (ref 8.4–10.5)
Chloride: 93 mEq/L — ABNORMAL LOW (ref 96–112)
Creatinine, Ser: 0.49 mg/dL (ref 0.4–1.5)
GFR calc Af Amer: >60 mL/min (ref >60)
GFR calc non Af Amer: >60 mL/min (ref >60)
Glucose, Bld: 98 mg/dL (ref 70–99)
Potassium: 4 mEq/L (ref 3.5–5.1)
Sodium: 131 mEq/L — ABNORMAL LOW (ref 135–145)

## 2010-11-18 LAB — TISSUE CULTURE
Culture: NO GROWTH
Culture: NO GROWTH

## 2010-11-18 LAB — CBC
HCT: 31.8 % — ABNORMAL LOW (ref 39.0–52.0)
Hemoglobin: 10.6 g/dL — ABNORMAL LOW (ref 13.0–17.0)
MCH: 27.1 pg (ref 26.0–34.0)
MCHC: 33.3 g/dL (ref 30.0–36.0)
MCV: 81.3 fL (ref 78.0–100.0)
Platelets: 206 10*3/uL (ref 150–400)
RBC: 3.91 MIL/uL — ABNORMAL LOW (ref 4.22–5.81)
RDW: 14.6 % (ref 11.5–15.5)
WBC: 7.9 10*3/uL (ref 4.0–10.5)

## 2010-11-18 LAB — BODY FLUID CULTURE: Culture: NO GROWTH

## 2010-11-19 ENCOUNTER — Inpatient Hospital Stay (HOSPITAL_COMMUNITY): Payer: 59

## 2010-11-19 NOTE — H&P (Signed)
NAME:  Paul Ray, Paul Ray                   ACCOUNT NO.:  [id number]  MEDICAL RECORD NO.:  [id number]           PATIENT TYPE:  LOCATION:                                 FACILITY:  PHYSICIAN:  Ines Bloomer, M.D. DATE OF BIRTH:  [birth year]  DATE OF ADMISSION: DATE OF DISCHARGE:                             HISTORY & PHYSICAL   CHIEF COMPLAINT:  Right lower lobe mass with effusion.  HISTORY OF PRESENT ILLNESS:  This  55 year old Caucasian male was seen by Dr. Marcelyn Bruins with a right lower lobe segmental lesion, he had some fever, chills, some weight loss and right chest pain.  The CT scan showed a right lower lobe lesion with an effusion and removal of this was bloody effusion with no organisms, however, after the effusion recurred and became more loculated and still had the lesion in the right middle lobe that was thought to be possible inflammatory.  He also has ascending aorta that is 5.1.  He is presently on amoxicillin.  He has had no recent fever or chills.  MEDICATIONS:  Include 1. Vitamin. 2. Lamisil. 3. Depakote 200 mg at night. 4. Tramadol. 5. Vyvanse 60 mg at night.  ALLERGIES:  He has no allergies.  He also has a anxiety disorder.  FAMILY HISTORY:  Noncontributory.  SOCIAL HISTORY:  He is married, has 2 children.  Works as a Music therapist. He smokes.  His wife works in bed placement in [facility].  REVIEW OF SYSTEMS:  He is 175 pounds, he is 5 feet 9 inches. He has had some recent weight loss.  CARDIAC:  No angina or atrial fibrillation. He just had the right chest tightness.  PULMONARY:  He has had wheezing, but no hemoptysis.  GI:  No nausea, vomiting, constipation, diarrhea. GU:  No kidney disease, dysuria or frequent urination.  VASCULAR:  No claudication, DVT, TIAs.  NEUROLOGICAL:  No dizziness, headaches, blackouts, seizures.  MUSCULOSKELETAL:  Arthritis.  PSYCHIATRIC: Anxiety disorder.  ENT:  No changes in eyesight or hearing. NEUROLOGICAL:  No problems  with bleeding, clotting disorders or anemia.  PHYSICAL EXAMINATION:  GENERAL:  He is a well-developed Caucasian male in no acute distress, but is very anxious. VITAL SIGNS:  Blood pressure 122/80, pulse 92, respirations 20, sats were 95%. HEENT:  Head is atraumatic.  Eyes:  Pupils equal, reactive to light and accommodation.  Extraocular movements normal.  Ears: Tympanic membranes intact.  Nose: There is no septal deviation.  Throat without lesion. NECK:  Supple without thyromegaly.  There is no supraclavicular or axillary adenopathy. CHEST:  Decreased breath sounds on the right side. HEART:  Regular sinus rhythm.  No murmurs. ABDOMEN:  Soft.  There is no splenomegaly. EXTREMITIES:  Pulses are 2+.  There is no clubbing or edema. NEUROLOGICAL:  He is oriented x3.  Sensory and motor intact.  IMPRESSION: 1. Recurrent right lower lobe effusion with right lower lobe superior     segmental mass, rule out inflammation versus guide catheter. 2. Anxiety disorder. 3. Chest pain.  PLAN:  Right VATS, drainage of pleural effusion, possible right lower lobe superior segmentectomy.  Ines Bloomer, M.D.     DPB/MEDQ  D:  11/13/2010  T:  11/14/2010  Job:  [id number]  Electronically Signed by Jovita Gamma M.D. on 11/19/2010 01:45:53 PM

## 2010-11-20 ENCOUNTER — Inpatient Hospital Stay (HOSPITAL_COMMUNITY): Payer: 59

## 2010-11-20 LAB — CBC
HCT: 31.2 % — ABNORMAL LOW (ref 39.0–52.0)
Hemoglobin: 10.1 g/dL — ABNORMAL LOW (ref 13.0–17.0)
MCH: 26.6 pg (ref 26.0–34.0)
MCHC: 32.4 g/dL (ref 30.0–36.0)
MCV: 82.3 fL (ref 78.0–100.0)
Platelets: 211 10*3/uL (ref 150–400)
RBC: 3.79 MIL/uL — ABNORMAL LOW (ref 4.22–5.81)
RDW: 15.1 % (ref 11.5–15.5)
WBC: 7.3 10*3/uL (ref 4.0–10.5)

## 2010-11-20 LAB — BASIC METABOLIC PANEL
BUN: 11 mg/dL (ref 6–23)
CO2: 33 mEq/L — ABNORMAL HIGH (ref 19–32)
Calcium: 8.9 mg/dL (ref 8.4–10.5)
Chloride: 98 mEq/L (ref 96–112)
Creatinine, Ser: 0.6 mg/dL (ref 0.4–1.5)
GFR calc Af Amer: >60 mL/min (ref >60)
GFR calc non Af Amer: >60 mL/min (ref >60)
Glucose, Bld: 94 mg/dL (ref 70–99)
Potassium: 4.1 mEq/L (ref 3.5–5.1)
Sodium: 139 mEq/L (ref 135–145)

## 2010-11-21 ENCOUNTER — Telehealth: Payer: Self-pay | Admitting: Pulmonary Disease

## 2010-11-21 NOTE — Telephone Encounter (Signed)
Called, spoke with pt.  States he had a thoracotomy on April 19 and has a routine dental cleaning on May 21.  States [facility] told him he couldn't have any type of dental procedure within 3 months of a hip resurfacing so he was wanting to know if he should cancel the cleaning bc of recent thoracotomy.  He is requesting I send this message to Dr. Shelle Iron to advise because of the major surgery he just had.  Aware [facility] out of office until tomorrow.

## 2010-11-22 NOTE — Telephone Encounter (Signed)
[id number]

## 2010-11-22 NOTE — Telephone Encounter (Signed)
Pt returned call. Paul Ray  °

## 2010-11-22 NOTE — Telephone Encounter (Signed)
Ok to have dental work.  He is on abx, and the chest is different than a closed joint space.

## 2010-11-22 NOTE — Telephone Encounter (Signed)
atc line busy x 3. WCB 

## 2010-11-25 ENCOUNTER — Ambulatory Visit (INDEPENDENT_AMBULATORY_CARE_PROVIDER_SITE_OTHER): Payer: Self-pay

## 2010-11-25 DIAGNOSIS — J852 Abscess of lung without pneumonia: Secondary | ICD-10-CM

## 2010-11-25 NOTE — Telephone Encounter (Signed)
ATC # provided x 3 - received fast busy signal -- WCB.

## 2010-11-25 NOTE — Telephone Encounter (Signed)
Pt returning call can be reached at [phone].Paul Ray

## 2010-11-25 NOTE — Telephone Encounter (Signed)
Spoke w/ pt and advised him per [facility] okat to have dental work. Pt verbalized understanding and needed nothing further

## 2010-11-27 ENCOUNTER — Other Ambulatory Visit: Payer: Self-pay | Admitting: Thoracic Surgery

## 2010-11-27 DIAGNOSIS — D381 Neoplasm of uncertain behavior of trachea, bronchus and lung: Secondary | ICD-10-CM

## 2010-11-28 ENCOUNTER — Ambulatory Visit
Admission: RE | Admit: 2010-11-28 | Discharge: 2010-11-28 | Disposition: A | Discharge status: Home / Self Care | Payer: 59 | Source: Ambulatory Visit | Attending: Thoracic Surgery | Admitting: Thoracic Surgery

## 2010-11-28 ENCOUNTER — Ambulatory Visit (INDEPENDENT_AMBULATORY_CARE_PROVIDER_SITE_OTHER): Payer: Self-pay | Admitting: Thoracic Surgery

## 2010-11-28 DIAGNOSIS — D381 Neoplasm of uncertain behavior of trachea, bronchus and lung: Secondary | ICD-10-CM

## 2010-11-28 DIAGNOSIS — J869 Pyothorax without fistula: Secondary | ICD-10-CM

## 2010-11-29 NOTE — Assessment & Plan Note (Signed)
OFFICE VISIT  Ray, Paul L DOB:  [birth year]                                        Nov 28, 2010 CHART #:  [id number]  The patient returned for followup today.  His blood pressure is 131/81, pulse 92, respirations 20, sats 100%.  Both staples and his sutures were removed.  Chest x-ray shows still a lot of pleural reaction but this is improving as well as decrease in right lower lobe superior segmental abscess.  Continue on antibiotics for another week and then we will see him back in 2 weeks.  He is given a refill for oxycodone IR #60.  His pain has been decreased.  Ines Bloomer, M.D. Electronically Signed  DPB/MEDQ  D:  11/28/2010  T:  11/29/2010  Job:  [id number]

## 2010-12-03 ENCOUNTER — Ambulatory Visit: Payer: 59 | Admitting: Thoracic Surgery

## 2010-12-04 ENCOUNTER — Ambulatory Visit (INDEPENDENT_AMBULATORY_CARE_PROVIDER_SITE_OTHER): Payer: Self-pay | Admitting: Thoracic Surgery

## 2010-12-04 DIAGNOSIS — J869 Pyothorax without fistula: Secondary | ICD-10-CM

## 2010-12-05 NOTE — Assessment & Plan Note (Signed)
OFFICE VISIT  Ray, Paul L DOB:  [birth year]                                        Dec 04, 2010 CHART #:  [id number]  The patient returned today.  His blood pressure is 114/75, pulse 95, respirations 18, sats were 98%.  We had a lot of calls from the patient regarding his pain issues.  He was given 2 mg of Dilaudid, #12 over the weekend because of severe pain.  This had occurred when he was at a soccer game with his daughter.  We talked to him on a daily basis and finally brought him in to discuss the situation here.  He says the pain starts at the right costal margin and then goes around posteriorly and is very intense and keeps him from taking a deep breath.  On physical exam, his lungs were clear bilaterally, although there were some slight decreased breath sounds on his right.  He is presently on oxycodone intermediate release 10 mg every 4-6 hours.  The pain he says is kind like stabbing or electrical pain right at the right upper quadrant.  I feel this is all neuropathic pain and I explained to him that we do not like the Dilaudid because of its high probabilities for addiction and explained to him that he should try to start decreasing the amount of oxycodone he takes since he is taking two every 4-6 hours.  We did add Celebrex 200 mg twice a day for an anti-inflammatory that we found does help with the pain and also gave him a prescription for Cymbalta 30 mg a day to help with the neuropathic pain.  He has previously taken Neurontin in the past.  He also takes Depakote and Vyvanse and did not see any interactions with those specific drugs.  It is a very difficult situation with the patient and that  seems to be functioning fairly normally.  His incisions are well healed, but he complains of severe pain all the time.  I am hoping that with this change in medication this will start modifying his pain.  We definitely will start trying to limit his  narcotic ingestion in the near future.  I will see him back again in 1 week with a chest x-ray.  Ines Bloomer, M.D. Electronically Signed  DPB/MEDQ  D:  12/04/2010  T:  12/05/2010  Job:  [id number]  cc:   Holley Bouche, M.D.

## 2010-12-05 NOTE — Discharge Summary (Signed)
NAME:  Paul Ray, Paul Ray                   ACCOUNT NO.:  [id number]  MEDICAL RECORD NO.:  [id number]           PATIENT TYPE:  I  LOCATION:  3304                         FACILITY:  [facility]  PHYSICIAN:  Ines Bloomer, M.D. DATE OF BIRTH:  [birth year]  DATE OF ADMISSION:  11/14/2010 DATE OF DISCHARGE:                              DISCHARGE SUMMARY   ADMITTING DIAGNOSES: 1. Right lower lobe mass with right pleural effusion. 2. History of anxiety disorder. 3. History of ascending and thoracic aortic aneurysm, 5.1 cm. 4. History of left foot Morton neuroma (status post excision of Morton     neuroma, February 2007).  DISCHARGE DIAGNOSES: 1. Right lower lobe lung abscess and empyema. 2. History of anxiety disorder. 3. History of ascending and thoracic aortic aneurysm, 5.1 cm. 4. History of left foot Morton neuroma (status post excision of Morton     neuroma, February 2007).  PROCEDURES:  Right VATS, decortication, debridement of abscess and drainage of empyema by Dr. Edwyna Shell on November 14, 2010.  HISTORY OF PRESENTING ILLNESS:  This is a 55 year old Caucasian male who was initially seen and evaluated by Dr. Edwyna Shell on November 06, 2010, regarding a right lower lobe superior segmental lesion.  The patient at that time had complaints of right-sided chest pain, fever, chills and some weight loss.  A CAT scan of the chest done showed a lesion in his right lower lobe with the pleural effusion.  He underwent a thoracentesis removal of bloody effusion (approximately 1 liter) and this was done on October 21, 2010.  Apparently, there was no malignant cells or organisms grown from the fluid.  Unfortunately, the right pleural effusion recurred, but was more loculated at this time and he still had the superior segmental lesion in the right lower lobe.  It was felt that this lesion may be inflammatory.  The patient was on amoxicillin when he was seen by Dr. Edwyna Shell in the office initially on November 06, 2010.   Potential risks, complications, and benefits of the right VATS and drainage of right pleural effusion were discussed with the patient.  He agreed to proceed with surgery and was admitted on November 14, 2010 to undergo right lung surgery.  BRIEF HOSPITAL COURSE STAY:  The patient was not found to have an air leak postoperatively, has suction and was decreased, and his A-line was removed on postop day #1.  His Foley and On-Q were removed on postoperative day #2.  He was transferred from the intensive care unit to 3300 for further convalescence.  His anterior chest tube was removed on postoperative day #3.  Chest tube #2 was removed on postop day #4 and chest tube #3 was placed to a Mini-Express.  Their again was no air leak and remaining chest tube was removed on postoperative day #5.  The patient remained afebrile, hemodynamically stable and his chest x-ray as well remained stable.  Currently on postoperative #5, he is afebrile, heart rate in the 80, BP 136/82, O2 sat 94% on room air.  The patient's only complaitn is some diffuse belly pain.  He is passing flatus, but has  not had a bowel movement yet.  He will be given a laxativeof  choice for constipation.  PHYSICAL EXAMINATION:  CARDIOVASCULAR:  Regular rate and rhythm. RESPIRATORY:  Crackles on the right incision.  The right posterior chest is clean, dry and continuing to heal.  The patient is going to be started on Augmentin 875 mg p.o. three times daily and this will be continued for 10 days.  Provided he remains afebrile, he has a bowel movement and his chest x-ray remains daily.  He was surgically stable for discharge on November 20, 2010.  Latest laboratory studies are as follows:  Final right fluid culture showed no organism or growth.  Preliminary AFB and fungal were negative. Last BMET done on November 18, 2010; potassium 4, sodium 131, BUN and creatinine 12 and 0.49 respectively.  CBC on this date, white count 7900, H and H 10.6  and 31.8 respectively, platelet count 206,000.  Last chest x-ray done on November 19, 2010, showed no pneumothorax on the right, small right pleural effusion and left lung clear.  Discharge instructions include the following:  DIET:  The patient is to remain on a regular diet.  ACTIVITY:  He may walk up steps.  He may shower.  He is not to lift more than 10 pounds for 2 weeks.  He is not drive until after 2 weeks.  He is to continue with his breathing exercise daily.  He is to walk every day and increase his frequency and duration as tolerates.  WOUND CARE:  He is to use soap and water on his wounds.  He is to contact the office any wound problems arise.  FOLLOWUP APPOINTMENT:  See Dr. Edwyna Shell on Nov 28, 2010 at 2:15 p.m., 45 minutes prior to this office appointment, a chest x-ray will be obtained.  DISCHARGE MEDICATIONS AT THE TIME OF DICTATION: 1. Oxycodone/APAP 5/325 one to two tablets p.o. q.4-6 h. p.r.n. pain. 2. Augmentin 875 mg p.o. two times daily for 10 days. 3. Depakote ER 500 mg four tablets p.o. at bedtime. 4. Glucosamine and chondroitin two tablets p.o. q.a.m. 5. Lamictal 100 mg p.o. q.a.m. 6. Multivitamin p.o. daily. 7. Omega-3 Krill over-the-counter one tablet p.o. q.a.m. 8. Vitamin B complex with C one tablet p.o. q.a.m. 9. Vyvanse 60 mg p.o. q.a.m.     Doree Fudge, PA   ______________________________ Ines Bloomer, M.D.    DZ/MEDQ  D:  11/19/2010  T:  11/20/2010  Job:  [id number]  cc:   Barbaraann Share, MD,FCCP  Electronically Signed by Doree Fudge PA on 11/20/2010 10:36:54 AM Electronically Signed by Jovita Gamma M.D. on 12/05/2010 03:38:31 PM

## 2010-12-05 NOTE — Discharge Summary (Signed)
  NAME:  Paul Ray, Paul Ray                   ACCOUNT NO.:  [id number]  MEDICAL RECORD NO.:  [id number]           PATIENT TYPE:  LOCATION:                                 FACILITY:  PHYSICIAN:  Ines Bloomer, M.D. DATE OF BIRTH:  May 23, 1956  DATE OF ADMISSION: DATE OF DISCHARGE:                              DISCHARGE SUMMARY   ADDENDUM  On morning rounds of November 20, 2010, the patient was noted to be afebrile, in normal sinus rhythm, blood pressure was stable.  O2 sat was greater than 90% on room air.  Chest x-ray noted to be stable with no pneumothorax noted.  Lab work shows white blood cell count 7.3, hemoglobin 10.1, hematocrit 31.2, platelet count 211.  Sodium of 139, potassium 4.1, chloride of 90, bicarbonate 33, BUN of 11, creatinine 0.6, glucose of 94.  The patient is up ambulating well.  Bowels functioning normally.  Pain stable on Percocet.  Incisions are clean, dry, and intact and healing well.  Plan today to discontinue every other staple and discharged to home.  The patient is concerned about taking the Percocet secondary to Tylenol, so we will switch him over to oxycodone 5 mg every 3 hours as needed for pain.  The patient is in agreement.  We will discharge the patient home today, November 20, 2010.  Please see dictated discharge summary for followup appointments and discharge instructions.  DISCHARGE MEDICATIONS:  As dictated with the change of discontinuation of Percocet and adding oxycodone 5 mg one two tablets q.3 hours p.r.n. pain.     Sol Blazing, PA   ______________________________ Ines Bloomer, M.D.    KMD/MEDQ  D:  11/20/2010  T:  11/20/2010  Job:  [id number]  Electronically Signed by Cameron Proud PA on 11/22/2010 12:58:05 PM Electronically Signed by Jovita Gamma M.D. on 12/05/2010 03:38:34 PM

## 2010-12-10 ENCOUNTER — Other Ambulatory Visit: Payer: Self-pay | Admitting: Thoracic Surgery

## 2010-12-10 DIAGNOSIS — J90 Pleural effusion, not elsewhere classified: Secondary | ICD-10-CM

## 2010-12-10 NOTE — Op Note (Signed)
NAME:  Paul Ray, Paul Ray                   ACCOUNT NO.:  [id number]   MEDICAL RECORD NO.:  [id number]          PATIENT TYPE:  AMB   LOCATION:  ENDO                         FACILITY:  [facility]   PHYSICIAN:  James L. Malon Kindle., M.D.DATE OF BIRTH:  12/06/1955   DATE OF PROCEDURE:  09/22/2007  DATE OF DISCHARGE:                               OPERATIVE REPORT   PROCEDURE:  Colonoscopy.   Medications were not given to me.  Please see the nurse's notes, sedated  by the nursing staff.   SCOPE:  Pentax colonoscope.   INDICATIONS:  Colon cancer screening.   DESCRIPTION OF PROCEDURE:  Procedure explained to the patient and  consent obtained.  In left lateral decubitus position the Pentax scope  was inserted, advanced.  The prep was excellent.  We were able to  advance easily to the cecum.  The ileocecal valve and appendiceal  orifice were seen.  The scope was withdrawn and the cecum, ascending,  transverse, descending and sigmoid colon was seen well.  No polyps,  tumors, diverticulosis or any other significant lesions.  Scope was  withdrawn.  The patient tolerated the procedure well and was maintained  on low-flow oxygen and pulse oximeter throughout the procedure.   ASSESSMENT:  Normal screening colonoscopy.   PLAN:  Will recommend routine follow-up with yearly Hemoccults in 5  years and repeat colonoscopy in 10 years.           ______________________________  Llana Aliment Malon Kindle., M.D.     Waldron Session  D:  09/22/2007  T:  09/23/2007  Job:  [id number]   cc:   Holley Bouche, M.D.  Fax: [phone]

## 2010-12-11 ENCOUNTER — Ambulatory Visit
Admission: RE | Admit: 2010-12-11 | Discharge: 2010-12-11 | Disposition: A | Discharge status: Home / Self Care | Payer: 59 | Source: Ambulatory Visit | Attending: Thoracic Surgery | Admitting: Thoracic Surgery

## 2010-12-11 ENCOUNTER — Ambulatory Visit (INDEPENDENT_AMBULATORY_CARE_PROVIDER_SITE_OTHER): Payer: Self-pay | Admitting: Thoracic Surgery

## 2010-12-11 DIAGNOSIS — J90 Pleural effusion, not elsewhere classified: Secondary | ICD-10-CM

## 2010-12-11 DIAGNOSIS — J869 Pyothorax without fistula: Secondary | ICD-10-CM

## 2010-12-11 NOTE — Assessment & Plan Note (Signed)
OFFICE VISIT  NOORANI, Tilford L DOB:  [birth year]                                        Dec 11, 2010 CHART #:  [id number]  The patient returns today.  His pain is slightly decreased.  He is on oxycodone, tramadol, and he did not get a prescription filled for Celebrex.  He also takes Cymbalta 30 mg at night.  His chest x-ray is stable, still shows reaction that we would see.  We will continue the same treatment, will see him back in 2 weeks with another chest x-ray.  Ines Bloomer, M.D. Electronically Signed  DPB/MEDQ  D:  12/11/2010  T:  12/11/2010  Job:  [id number]

## 2010-12-13 LAB — FUNGUS CULTURE W SMEAR
Fungal Smear: NONE SEEN
Fungal Smear: NONE SEEN
Fungal Smear: NONE SEEN

## 2010-12-13 NOTE — Op Note (Signed)
[place]  Patient:    Paul Ray, Paul Ray                            MRN: [id number] Proc. Date: 10/20/00 Attending:  Nicki Reaper, M.D. CC:         Nicki Reaper, M.D. (2)   Operative Report  PREOPERATIVE DIAGNOSIS:  Mass extensor surface proximal interphalangeal joint, left index finger.  POSTOPERATIVE DIAGNOSIS:  Mass extensor surface proximal proximal interphalangeal joint, left index finger.  OPERATION:  Excision of mass, debridement and repair of extensor tendon, left index finger.  SURGEON:  Nicki Reaper, M.D.  ASSISTANT:  ANESTHESIA:  Forearm-based IV regional.  ANESTHESIOLOGIST:  Janetta Hora. Gelene Mink, M.D.  INDICATIONS:  The patient is a 55 year old male with a history of an injury to the dorsal aspect radial side of the PIP joint of the left index finger.  He subsequently developed a mass over this area, which is painful for him.  DESCRIPTION OF PROCEDURE:  The patient is brought to the operating room where a forearm-based IV regional anesthetic was carried out without difficulty.  He was prepped and draped using Betadine scrub and solution, with the left arm free.  A curvilinear incision was made over the mass and carried down through the subcutaneous tissue.  A large sessile-type mass was immediately apparent within the extensor tendon.  With blunt and sharp dissection this was dissected free down to the joint.  The area was entirely excised and sent to pathology.  No further lesions were identified.  Moderate thickening and erythematous changes of the synovium were encountered.  The extensor tendon was then repaired with a running #5-0 Mersilene suture after irrigation.  The wound was closed with interrupted #5-0 nylon suture.  A sterile compressive dressing and splint to the finger was applied.  The patient tolerated the procedure well and was taken to the recovery room for observation in satisfactory  condition.  DISPOSITION:  He is discharged home, to return to the [facility] [place] in on week on Vicodin and Keflex. DD:  10/20/00 TD:  10/20/00 Job: [id number]

## 2010-12-13 NOTE — Op Note (Signed)
NAME:  Paul Ray, Paul Ray NO.:  [id number]   MEDICAL RECORD NO.:  [id number]          PATIENT TYPE:  AMB   LOCATION:  DSC                          FACILITY:  [facility]   PHYSICIAN:  Vanita Panda. Magnus Ivan, M.D.DATE OF BIRTH:  04/11/1956   DATE OF PROCEDURE:  12/30/2005  DATE OF DISCHARGE:                                 OPERATIVE REPORT   PREOPERATIVE DIAGNOSIS:  Left foot Morton's neuroma between third and fourth  web space.   POSTOPERATIVE DIAGNOSIS:  Left foot Morton's neuroma between third and  fourth web space.   PROCEDURE:  Excision of Morton's neuroma, left foot.   SURGEON:  Vanita Panda. Magnus Ivan, M.D.   ANESTHESIA:  General.   TOURNIQUET TIME:  Twenty minutes.   BLOOD LOSS:  Minimal.   COMPLICATIONS:  None.   INDICATIONS:  Briefly, Paul Ray is a 55 year old with a long history of left  foot pain.  This was evaluated by another physician and an MRI was even  obtained that showed findings consistent with a large Morton's neuroma  between the third and fourth web space.  He underwent injections by a  podiatrist and another surgeon and these were refractory to this improving.  After a long period of pain and conservative treatment, he sought surgical  advice.  He was originally seen by another Careers adviser, who for some reason  backed out from his taking care of this from a surgery standpoint and he  came to me for second opinion.  On exam, he did have findings consistent  with a Morton's neuroma and I felt with the long conservative treatment that  had failed, that a surgical option was a possibility.  The risks and  benefits of this were explained and understood to him and he agreed to  proceed with surgery.   PROCEDURE DESCRIPTION:  After informed consent was obtained, the appropriate  left ankle was marked.  Jarvis was brought to the operating room and placed  supine on the operating table.  General anesthesia was obtained.  A  nonsterile tourniquet was  placed around his upper thigh and his leg was  prepped and draped with DuraPrep and sterile drapes.  A Esmarch was used to  wrap out the leg and the tourniquet was inflated to 300 mL of pressure.  An  incision was made on the dorsum of the foot extending from the web space  between the third and fourth toes and extending proximally.  I carried this  down through the soft tissues to the metatarsals.  I then used a lamina  spreader to spread apart the metatarsals and the transverse metatarsal  ligament was identified.  I put a Freer plantar to the transverse metatarsal  ligament and divided it longitudinally with a knife to expose a large  neuroma.  There was abundant fatty tissue in this area and again consistent  with a large neuroma.  I tracked this proximally to the common digital nerve  and with the foot fully plantarflexed, I made an incision to sever the nerve  with a knife so the nerve would retract  proximally.  I then dissected this  out distally and remove the neuroma in its entirety and sent it to Pathology  for identification.  The tourniquet was let down at 20 minutes and adequate  hemostasis was then obtained.  I irrigated the wound thoroughly and then  closed the skin with interrupted 3-0 nylon suture.  I infiltrated  the wound with 0.25% plain Sensorcaine, placed Xeroform over the incision  followed by a well-padded compressive dressing consisting of Coban.  Postoperatively, he will be in a postop shoe with weightbearing to the heel  only and I will see him in the clinic in 2 weeks.  He will start an anti-  inflammatory in 5 days as well.           ______________________________  Vanita Panda. Magnus Ivan, M.D.     CYB/MEDQ  D:  12/30/2005  T:  12/31/2005  Job:  [id number]

## 2010-12-13 NOTE — Op Note (Signed)
NAME:  Paul Ray, Paul Ray                   ACCOUNT NO.:  [id number]   MEDICAL RECORD NO.:  [id number]          PATIENT TYPE:  AMB   LOCATION:  DSC                          FACILITY:  [facility]   PHYSICIAN:  Paul Fitch. Sypher Montez Hageman., M.D.DATE OF BIRTH:  May 10, 1956   DATE OF PROCEDURE:  08/01/2004  DATE OF DISCHARGE:                                 OPERATIVE REPORT   PREOPERATIVE DIAGNOSIS:  Bilateral chronic extensor tendinopathy at elbows,  unresponsive to more than 6 months of nonoperative measures including  activity modification, therapy including iontophoresis and ultrasound,  stretching exercise, work modification and steroid injection.   POSTOPERATIVE DIAGNOSIS:  Bilateral chronic extensor tendinopathy at elbows,  unresponsive to more than 6 months of nonoperative measures including  activity modification, therapy including iontophoresis and ultrasound,  stretching exercise, work modification and steroid injection.   OPERATION:  Reconstruction of extensor carpi radialis brevis and extensor  carpi radialis longus tendon origin, right elbow, with decortication,  multiple drilling and debridement of minor osteophyte at right humeral  lateral epicondyle, followed by reconstruction of tendon origin utilizing  three 3-0 FiberWire through bone mattress sutures, followed by fascial  repair.   OPERATING SURGEON:  Paul Fitch. Sypher, M.D.   ASSISTANT:  Paul Ray, P.A.   ANESTHESIA:  General by LMA.   SUPERVISING ANESTHESIOLOGIST:  Paul Ray, M.D.   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   During the past year, from January of 2005 to January of 2006, he has had  relentless bilateral lateral elbow pain.  He sought an upper extremity  orthopedic consult on February 07, 2004 and at that time, was noted to have a  generalized  stiffness of his elbows with about a 20-degree flexion  contracture of the right and left elbows.   Plain films of the elbows suggested that he was beginning to develop some  early degenerative arthritis with radiographic findings of several small  loose bodies and some sharpening of his condyles on the left.   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  He had tried glucosamine  without relief.   We had a lengthy consultation regarding management options for lateral  epicondylitis.   I explained to him that the current rationale for treatment of this is  management of a tendinosus with therapy, rest and limited use of anti-  inflammatory medications.  Paul Ray's course has been remarkable for a number  of troubling themes including constant requests for narcotic analgesics and  other analgesics in the form of Ultram at very large volumes of consumption.   We have repeatedly had episodes of frank counseling in which I explained to  him that narcotic analgesics are inappropriate in my judgment for the  management of a tendinopathy, however, given the fact that he is a  self-  Programmer, systems, he has been continuing to use his arms heavily in an  effort to maintain his business flow.   He has essentially been pinned into a corner where his livelihood depends on  heavy use of his arms and he frankly has not been able to adequately rest.   After a lengthy informed consent in September of 2005, he underwent a  steroid and lidocaine injection of his right elbow, which did give him  relief for several days.  He has not, however, been able to achieve  longstanding relief.   Given informed consent regarding the risks and benefits of repeated  injection versus reconstruction of the tendon origin, he elects to proceed  at this time with  reconstruction.   Preoperatively, we had lengthy counseling on July 05, 2004 regarding the  perioperative use of narcotic medications.   It is not clear to me whether Paul Ray has a very low pain threshold or has  had a past medical history that renders him tolerant to narcotics.   We proceed at this time to attempt to control the pain of his right elbow by  tendon reconstruction.   Preoperatively, he was carefully counseled about appropriate activity  following surgery including 3 weeks of wrist splinting followed by a total  of 6 weeks from the index surgical date of elbow range of motion and hand  and wrist range of motion only and progressive resistance from weeks 6  through 12, not anticipating to work heavily with his arms until 12 weeks  postop.   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   PROCEDURE:  Paul Ray was brought to the operating room and placed in supine  position upon the operating table.  Following anesthesia consultation by Dr.  Gypsy Ray, general anesthesia by LMA was selected as appropriate.   Under the supervision of Paul Ray, general anesthesia was induced, followed by  routine Betadine scrub and paint of the right upper extremity with placement  of an arterial tourniquet on the proximal brachium.   The arm was exsanguinated with an Esmarch bandage and the arterial  tourniquet inflated to 220 mmHg.   Ancef 1 g was administered in the holding area prior to surgery as an IV  prophylactic antibiotic.   The procedure commenced with a 4-cm curvilinear incision posterior to the  epicondyle following the posterior border of the extensor carpi radialis  longus.  Superficial fascia was released, followed by release of the extensor fascia in the line of its fibers.  The extensor carpi radialis  longus and brevis were elevated  subperiosteally off of the epicondyle,  revealing a large area of cavitation adjacent to the capitellum with  extensive neovascularization.  There was a gritty calcific nature to the  remaining portion of the extensor carpi radialis brevis that was still  attached to the epicondyle.  The entire brevis and extensor carpi radialis  longus origin was elevated until the brachioradialis muscle fibers were  identified.   The osteophyte at the epicondyle was removed with a rongeur, followed by  drilling of the epicondyle with a 0.035-inch Kirschner wire and 0.045-inch  Kirschner wire approximately 30 times to create excellent vascular access to  the intramedullary canal.   The surface of the epicondyle was also scored with a 4-mm osteotome to  increase bone surface area for healing.  The deep surface of the tendon origin was debrided of necrotic tendon, which  was sent for pathologic evaluation.  A total of three 3.0 FiberWire mattress  sutures were placed with through-bone drill holes in the manner of  McLaughlin, similar to the way one would repair a rotator cuff.   With the elbow at about 20 degrees of flexion, the origin was anatomically  repaired with the through-bone mattress sutures and subsequently a finishing  suture of 3-0 FiberWire was used to smooth the repair to the anconeus fascia  posteriorly.  The superficial fascia was then repaired with a running suture  of 4-0 Vicryl.   The skin was repaired with intradermal 3-0 Prolene, followed by extensive  infiltration of 0.25% Marcaine for postoperative analgesia.   Mr. Zeitz was placed in a compressive hand dressing with a volar plaster  splint to maintain the wrist in 40 degrees of dorsiflexion with Ace wrap.  The elbow wound was dressed with a Steri-Strip, sterile gauze, a Tegaderm  porous dressing, followed by Ace wrap.   For aftercare, Mr. Mcclenny will begin active range of motion exercises to the  elbow.  He may apply ice and  will user a combination of Dilaudid and Motrin  for postoperative analgesia.  He was give Keflex 500 mg 1 p.o. q.8 h. x4  days as a prophylactic antibiotic.   He will return to our office in followup in approximately 1 week to initiate  a supervised therapy program.      Robe   RVS/MEDQ  D:  08/01/2004  T:  08/01/2004  Job:  [id number]   cc:   Holley Bouche, M.D.  [address]  [place], [place] [address]  Fax: [phone]

## 2010-12-24 ENCOUNTER — Other Ambulatory Visit: Payer: Self-pay | Admitting: Thoracic Surgery

## 2010-12-24 DIAGNOSIS — J90 Pleural effusion, not elsewhere classified: Secondary | ICD-10-CM

## 2010-12-25 ENCOUNTER — Ambulatory Visit
Admission: RE | Admit: 2010-12-25 | Discharge: 2010-12-25 | Disposition: A | Discharge status: Home / Self Care | Payer: 59 | Source: Ambulatory Visit | Attending: Thoracic Surgery | Admitting: Thoracic Surgery

## 2010-12-25 ENCOUNTER — Ambulatory Visit (INDEPENDENT_AMBULATORY_CARE_PROVIDER_SITE_OTHER): Payer: Self-pay | Admitting: Thoracic Surgery

## 2010-12-25 DIAGNOSIS — J869 Pyothorax without fistula: Secondary | ICD-10-CM

## 2010-12-25 DIAGNOSIS — J90 Pleural effusion, not elsewhere classified: Secondary | ICD-10-CM

## 2010-12-26 NOTE — Assessment & Plan Note (Signed)
OFFICE VISIT  Paul Ray, [name] DOB:  [birth year]                                        Dec 25, 2010 CHART #:  [id number]  The patient returns today.  His pain is better.  We did give him a refill for OxyContin IR #50 and we no more refills after this one.  His chest x-ray showed further decrease in the pleural thickening and scarring in his lung after he had the empyema on right VATS.  He is doing better overall.  He talked about whether he should had surgery on his left hip and I recommended that he schedule that after July 1.  I will see him back in 6 weeks with a chest x-ray.  His lungs are clear to auscultation and percussion.  Blood pressure is 131/89, pulse 84, respirations 20, sats were 98%.  Ines Bloomer, M.D. Electronically Signed  DPB/MEDQ  D:  12/25/2010  T:  12/26/2010  Job:  [id number]

## 2010-12-29 LAB — AFB CULTURE WITH SMEAR (NOT AT ARMC)
Acid Fast Smear: NONE SEEN
Acid Fast Smear: NONE SEEN
Acid Fast Smear: NONE SEEN

## 2010-12-30 ENCOUNTER — Telehealth: Payer: Self-pay | Admitting: Pulmonary Disease

## 2010-12-30 NOTE — Telephone Encounter (Signed)
Pt is needing surgical clearance for hip surgery with Dr. Edwyna Shell after July 1.  Pt was last seen on 10/2010 with [facility].  Please advise on letter to fax to Dr. Edwyna Shell. thanks

## 2010-12-31 NOTE — Telephone Encounter (Signed)
Note written and left in triage.

## 2010-12-31 NOTE — Telephone Encounter (Signed)
What are they talking about?  Dr. Edwyna Shell does not do hip surgery.

## 2010-12-31 NOTE — Telephone Encounter (Signed)
Letter faxed to Tammy at [facility].  Pt aware.

## 2010-12-31 NOTE — Telephone Encounter (Signed)
Called, spoke with pt. He states Dr. Edwyna Shell states he can have the hip surgery anytime after July 1 but Dr. Almyra Free at [facility] will be doing the surgery.  I verified the fax # -- pt requesting surgery clearance from [facility] now.  Will forward message back to him for clarification.

## 2011-01-03 ENCOUNTER — Telehealth: Payer: Self-pay | Admitting: Pulmonary Disease

## 2011-01-03 NOTE — Telephone Encounter (Signed)
Aundra Millet, do you know what happened to note after it was faxed? Looks like we need to resend it, so please advise if in [facility] scan folder thanks!

## 2011-01-03 NOTE — Telephone Encounter (Signed)
refaxed note to [facility].  [facility] informing pt that letter was refaxed to [place] at [facility].

## 2011-01-27 ENCOUNTER — Other Ambulatory Visit: Payer: Self-pay | Admitting: Thoracic Surgery

## 2011-01-27 DIAGNOSIS — D381 Neoplasm of uncertain behavior of trachea, bronchus and lung: Secondary | ICD-10-CM

## 2011-01-28 ENCOUNTER — Ambulatory Visit (INDEPENDENT_AMBULATORY_CARE_PROVIDER_SITE_OTHER): Payer: Self-pay | Admitting: Thoracic Surgery

## 2011-01-28 ENCOUNTER — Ambulatory Visit
Admission: RE | Admit: 2011-01-28 | Discharge: 2011-01-28 | Disposition: A | Discharge status: Home / Self Care | Payer: 59 | Source: Ambulatory Visit | Attending: Thoracic Surgery | Admitting: Thoracic Surgery

## 2011-01-28 DIAGNOSIS — D381 Neoplasm of uncertain behavior of trachea, bronchus and lung: Secondary | ICD-10-CM

## 2011-01-28 DIAGNOSIS — J869 Pyothorax without fistula: Secondary | ICD-10-CM

## 2011-01-28 NOTE — Assessment & Plan Note (Signed)
OFFICE VISIT  LUKA, Mikhai L DOB:  [birth year]                                        January 28, 2011 CHART #:  [id number]  Ms. Maish came for followup today.  His chest x-ray showed marked improvement.  There is some minimal scar in the right side.  His incisions are well healed.  His blood pressure is 124/80, pulse 80, respirations 18, sats were 98%.  I will release him to full activity. He will have some surgery on his hip, what I believe is his right hip next week.  I will see him.  He still has some mild chest wall pain.  I will plan to see him back in 3 months with a chest x-ray for final check.  Ines Bloomer, M.D. Electronically Signed  DPB/MEDQ  D:  01/28/2011  T:  01/28/2011  Job:  [id number]

## 2011-02-04 ENCOUNTER — Ambulatory Visit: Payer: 59 | Admitting: Thoracic Surgery

## 2011-02-04 NOTE — Assessment & Plan Note (Signed)
OFFICE VISIT  CEDANO, Jaleen L DOB:  08/04/1955                                        November 25, 2010 CHART #:  [id number]  Mr. Vivas comes in today for discussion about his pain medications.  He is status post a right VATS with decortication, drainage of empyema, and resection of lung abscess on November 14, 2010.  He did have some pain control issues in the hospital, did not tolerate the fentanyl PCA, but did do much better with the Dilaudid PCA.  At the time of his discharge, he was weaned to oxycodone and had decent pain control.  The patient states that since he is discharged home, he has run low on the oxycodone.  He called our office on Friday, at which time, Dr. Edwyna Shell was out of town.  He did have some Ultram at home, which he takes for chronic hip pain and he was instructed to take this until his follow-up appointment with Dr. Edwyna Shell.  Since then, he states that his pain has prohibited him from his activities.  He is having a difficult time getting comfortable to sleep.  He is now completely out of the oxycodone and feels that the tramadol and Tylenol are ineffective for his pain. He has otherwise been stable.  His breathing has been stable.  He has had no cough or shortness of breath.  PHYSICAL EXAMINATION:  VITAL SIGNS:  Blood pressure is 134/84, pulse is 102, respirations 20, O2 sat 98% on room air.  His right thoracotomy incision and chest tube sites have healed well.  Staples and chest tube sutures remain in place and will be left until his visit with Dr. Edwyna Shell.  HEART:  Regular rate and rhythm.  LUNGS:  Clear to auscultation with slightly diminished breath sounds on the right.  ASSESSMENT AND PLAN:  Mr. Ferger is doing well overall at this point. Since he is completely out of pain medication, I have given him a prescription for oxycodone IR 5 mg one to two q.4 h. p.r.n. for pain, #40, with no refills until he returns on Thursday to see Dr. Edwyna Shell.   He will follow up at that time and will call in the interim if he experiences any further problems or has questions.  Coral Ceo, P.A.  GC/MEDQ  D:  11/25/2010  T:  11/26/2010  Job:  [id number]  cc:   Barbaraann Share, MD,FCCP

## 2011-03-11 ENCOUNTER — Ambulatory Visit: Payer: 59 | Attending: Orthopedic Surgery

## 2011-03-11 DIAGNOSIS — M25559 Pain in unspecified hip: Secondary | ICD-10-CM | POA: Insufficient documentation

## 2011-03-11 DIAGNOSIS — M25659 Stiffness of unspecified hip, not elsewhere classified: Secondary | ICD-10-CM | POA: Insufficient documentation

## 2011-03-11 DIAGNOSIS — IMO0001 Reserved for inherently not codable concepts without codable children: Secondary | ICD-10-CM | POA: Insufficient documentation

## 2011-03-11 DIAGNOSIS — M6281 Muscle weakness (generalized): Secondary | ICD-10-CM | POA: Insufficient documentation

## 2011-03-11 DIAGNOSIS — R269 Unspecified abnormalities of gait and mobility: Secondary | ICD-10-CM | POA: Insufficient documentation

## 2011-03-13 ENCOUNTER — Ambulatory Visit: Payer: 59

## 2011-03-18 ENCOUNTER — Ambulatory Visit: Payer: 59 | Admitting: Physical Therapy

## 2011-03-20 ENCOUNTER — Ambulatory Visit: Payer: 59

## 2011-03-25 ENCOUNTER — Ambulatory Visit: Payer: 59 | Admitting: Physical Therapy

## 2011-03-27 ENCOUNTER — Ambulatory Visit: Payer: 59 | Admitting: Physical Therapy

## 2011-04-01 ENCOUNTER — Ambulatory Visit: Payer: 59 | Attending: Orthopedic Surgery | Admitting: Physical Therapy

## 2011-04-01 DIAGNOSIS — M25659 Stiffness of unspecified hip, not elsewhere classified: Secondary | ICD-10-CM | POA: Insufficient documentation

## 2011-04-01 DIAGNOSIS — R269 Unspecified abnormalities of gait and mobility: Secondary | ICD-10-CM | POA: Insufficient documentation

## 2011-04-01 DIAGNOSIS — M25559 Pain in unspecified hip: Secondary | ICD-10-CM | POA: Insufficient documentation

## 2011-04-01 DIAGNOSIS — IMO0001 Reserved for inherently not codable concepts without codable children: Secondary | ICD-10-CM | POA: Insufficient documentation

## 2011-04-01 DIAGNOSIS — M6281 Muscle weakness (generalized): Secondary | ICD-10-CM | POA: Insufficient documentation

## 2011-04-03 ENCOUNTER — Ambulatory Visit: Payer: 59 | Admitting: Physical Therapy

## 2011-04-08 ENCOUNTER — Encounter: Payer: 59 | Admitting: Physical Therapy

## 2011-04-09 ENCOUNTER — Ambulatory Visit: Payer: 59 | Admitting: Physical Therapy

## 2011-04-10 ENCOUNTER — Ambulatory Visit: Payer: 59 | Admitting: Physical Therapy

## 2011-04-15 ENCOUNTER — Ambulatory Visit: Payer: 59 | Admitting: Physical Therapy

## 2011-04-17 ENCOUNTER — Ambulatory Visit: Payer: 59 | Admitting: Physical Therapy

## 2011-04-22 ENCOUNTER — Other Ambulatory Visit: Payer: Self-pay | Admitting: Thoracic Surgery

## 2011-04-22 ENCOUNTER — Ambulatory Visit: Payer: 59 | Admitting: Physical Therapy

## 2011-04-22 DIAGNOSIS — D381 Neoplasm of uncertain behavior of trachea, bronchus and lung: Secondary | ICD-10-CM

## 2011-04-23 DIAGNOSIS — Z96649 Presence of unspecified artificial hip joint: Secondary | ICD-10-CM

## 2011-04-23 HISTORY — DX: Presence of unspecified artificial hip joint: Z96.649

## 2011-04-24 ENCOUNTER — Ambulatory Visit: Payer: 59 | Admitting: Physical Therapy

## 2011-04-29 ENCOUNTER — Encounter: Payer: 59 | Admitting: Physical Therapy

## 2011-04-30 ENCOUNTER — Ambulatory Visit: Payer: 59 | Attending: Orthopedic Surgery

## 2011-04-30 DIAGNOSIS — M25559 Pain in unspecified hip: Secondary | ICD-10-CM | POA: Insufficient documentation

## 2011-04-30 DIAGNOSIS — R269 Unspecified abnormalities of gait and mobility: Secondary | ICD-10-CM | POA: Insufficient documentation

## 2011-04-30 DIAGNOSIS — M6281 Muscle weakness (generalized): Secondary | ICD-10-CM | POA: Insufficient documentation

## 2011-04-30 DIAGNOSIS — IMO0001 Reserved for inherently not codable concepts without codable children: Secondary | ICD-10-CM | POA: Insufficient documentation

## 2011-04-30 DIAGNOSIS — M25659 Stiffness of unspecified hip, not elsewhere classified: Secondary | ICD-10-CM | POA: Insufficient documentation

## 2011-04-30 LAB — POCT RAPID STREP A: Streptococcus, Group A Screen (Direct): NEGATIVE

## 2011-05-01 ENCOUNTER — Encounter: Payer: 59 | Admitting: Physical Therapy

## 2011-05-02 ENCOUNTER — Ambulatory Visit: Payer: 59

## 2011-05-02 DIAGNOSIS — M25559 Pain in unspecified hip: Secondary | ICD-10-CM | POA: Insufficient documentation

## 2011-05-07 ENCOUNTER — Ambulatory Visit: Payer: 59 | Admitting: Physical Therapy

## 2011-05-08 ENCOUNTER — Other Ambulatory Visit (HOSPITAL_COMMUNITY): Payer: Self-pay | Admitting: Orthopaedic Surgery

## 2011-05-08 DIAGNOSIS — M75101 Unspecified rotator cuff tear or rupture of right shoulder, not specified as traumatic: Secondary | ICD-10-CM

## 2011-05-08 DIAGNOSIS — M25519 Pain in unspecified shoulder: Secondary | ICD-10-CM

## 2011-05-08 DIAGNOSIS — M4800 Spinal stenosis, site unspecified: Secondary | ICD-10-CM

## 2011-05-09 ENCOUNTER — Ambulatory Visit: Payer: 59 | Admitting: Physical Therapy

## 2011-05-12 ENCOUNTER — Other Ambulatory Visit: Payer: Self-pay | Admitting: Thoracic Surgery

## 2011-05-12 DIAGNOSIS — J869 Pyothorax without fistula: Secondary | ICD-10-CM

## 2011-05-13 ENCOUNTER — Ambulatory Visit: Payer: 59 | Admitting: Physical Therapy

## 2011-05-13 DIAGNOSIS — F319 Bipolar disorder, unspecified: Secondary | ICD-10-CM | POA: Insufficient documentation

## 2011-05-13 DIAGNOSIS — J869 Pyothorax without fistula: Secondary | ICD-10-CM | POA: Insufficient documentation

## 2011-05-13 DIAGNOSIS — F988 Other specified behavioral and emotional disorders with onset usually occurring in childhood and adolescence: Secondary | ICD-10-CM | POA: Insufficient documentation

## 2011-05-13 DIAGNOSIS — B1920 Unspecified viral hepatitis C without hepatic coma: Secondary | ICD-10-CM | POA: Insufficient documentation

## 2011-05-13 DIAGNOSIS — J852 Abscess of lung without pneumonia: Secondary | ICD-10-CM | POA: Insufficient documentation

## 2011-05-14 ENCOUNTER — Ambulatory Visit (HOSPITAL_COMMUNITY)
Admission: RE | Admit: 2011-05-14 | Discharge: 2011-05-14 | Disposition: A | Discharge status: Home / Self Care | Payer: 59 | Source: Ambulatory Visit | Attending: Thoracic Surgery | Admitting: Thoracic Surgery

## 2011-05-14 ENCOUNTER — Encounter: Payer: Self-pay | Admitting: Thoracic Surgery

## 2011-05-14 ENCOUNTER — Ambulatory Visit (INDEPENDENT_AMBULATORY_CARE_PROVIDER_SITE_OTHER): Payer: 59 | Admitting: Thoracic Surgery

## 2011-05-14 VITALS — BP 129/75 | HR 90 | Resp 16 | Ht 69.0 in | Wt 160.0 lb

## 2011-05-14 DIAGNOSIS — J9819 Other pulmonary collapse: Secondary | ICD-10-CM | POA: Insufficient documentation

## 2011-05-14 DIAGNOSIS — R079 Chest pain, unspecified: Secondary | ICD-10-CM | POA: Insufficient documentation

## 2011-05-14 DIAGNOSIS — B1920 Unspecified viral hepatitis C without hepatic coma: Secondary | ICD-10-CM | POA: Insufficient documentation

## 2011-05-14 DIAGNOSIS — J869 Pyothorax without fistula: Secondary | ICD-10-CM

## 2011-05-14 NOTE — Progress Notes (Signed)
HPI patient returns.He apparently has had his hip surgery done at the [facility]. His incision is well healed. His chest x-ray shows just some mild scarring. He still complains of some mild postthoracotomy pain. We will see him back again as needed.  Current Outpatient Prescriptions  Medication Sig Dispense Refill  . b complex vitamins tablet Take 1 tablet by mouth daily.        . divalproex (DEPAKOTE) 500 MG EC tablet Take 500 mg by mouth 4 (four) times daily.        . Glucosamine-Chondroit-Vit C-Mn (GLUCOSAMINE CHONDROITIN COMPLX) CAPS Take 2 capsules by mouth daily.        Marland Kitchen lamoTRIgine (LAMICTAL) 100 MG tablet Take 100 mg by mouth daily.        Marland Kitchen lisdexamfetamine (VYVANSE) 60 MG capsule Take 60 mg by mouth daily.        . Multiple Vitamin (MULTIVITAMIN) capsule Take 1 capsule by mouth daily.        . OMEGA-3 KRILL OIL 300 MG CAPS Take 1 capsule by mouth daily.        . traMADol (ULTRAM) 50 MG tablet Take 1 to 2 tabs three to four times a day for pain      . amoxicillin-clavulanate (AUGMENTIN) 875-125 MG per tablet 1 tablet bid  6 tablet  0  . HYDROcodone-acetaminophen (VICODIN) 5-500 MG per tablet Take 1 tablet by mouth every 6 (six) hours as needed for pain.  30 tablet  0     Review of Systems: Unchanged   Physical Exam  Cardiovascular: Normal rate, regular rhythm and normal heart sounds.   Pulmonary/Chest: Effort normal and breath sounds normal. No respiratory distress.     Diagnostic Tests: Chest x-ray shows normal postoperative changes. Mild scar scarring in the right costophrenic angle  Impression: Status post empyema and lung abscess in the right lower lobe   Plan: Return as needed

## 2011-05-15 ENCOUNTER — Ambulatory Visit (HOSPITAL_COMMUNITY)
Admission: RE | Admit: 2011-05-15 | Discharge: 2011-05-15 | Disposition: A | Discharge status: Home / Self Care | Payer: 59 | Source: Ambulatory Visit | Attending: Orthopaedic Surgery | Admitting: Orthopaedic Surgery

## 2011-05-15 ENCOUNTER — Ambulatory Visit: Payer: 59 | Admitting: Physical Therapy

## 2011-05-15 DIAGNOSIS — M5380 Other specified dorsopathies, site unspecified: Secondary | ICD-10-CM | POA: Insufficient documentation

## 2011-05-15 DIAGNOSIS — M25519 Pain in unspecified shoulder: Secondary | ICD-10-CM | POA: Insufficient documentation

## 2011-05-15 DIAGNOSIS — Q7649 Other congenital malformations of spine, not associated with scoliosis: Secondary | ICD-10-CM | POA: Insufficient documentation

## 2011-05-15 DIAGNOSIS — M4800 Spinal stenosis, site unspecified: Secondary | ICD-10-CM

## 2011-05-15 DIAGNOSIS — M719 Bursopathy, unspecified: Secondary | ICD-10-CM | POA: Insufficient documentation

## 2011-05-15 DIAGNOSIS — M67919 Unspecified disorder of synovium and tendon, unspecified shoulder: Secondary | ICD-10-CM | POA: Insufficient documentation

## 2011-05-15 DIAGNOSIS — M75101 Unspecified rotator cuff tear or rupture of right shoulder, not specified as traumatic: Secondary | ICD-10-CM

## 2011-05-15 DIAGNOSIS — M19019 Primary osteoarthritis, unspecified shoulder: Secondary | ICD-10-CM | POA: Insufficient documentation

## 2011-05-20 ENCOUNTER — Ambulatory Visit: Payer: 59 | Admitting: Physical Therapy

## 2011-05-22 ENCOUNTER — Ambulatory Visit: Payer: 59

## 2011-05-22 ENCOUNTER — Encounter: Payer: 59 | Admitting: Physical Therapy

## 2011-06-02 ENCOUNTER — Other Ambulatory Visit (HOSPITAL_COMMUNITY): Payer: Self-pay | Admitting: Orthopaedic Surgery

## 2011-06-02 ENCOUNTER — Ambulatory Visit: Payer: 59

## 2011-06-02 DIAGNOSIS — M25512 Pain in left shoulder: Secondary | ICD-10-CM

## 2011-06-03 ENCOUNTER — Ambulatory Visit: Payer: 59 | Attending: Physical Medicine and Rehabilitation

## 2011-06-03 DIAGNOSIS — IMO0001 Reserved for inherently not codable concepts without codable children: Secondary | ICD-10-CM | POA: Insufficient documentation

## 2011-06-03 DIAGNOSIS — M6281 Muscle weakness (generalized): Secondary | ICD-10-CM | POA: Insufficient documentation

## 2011-06-03 DIAGNOSIS — M25559 Pain in unspecified hip: Secondary | ICD-10-CM | POA: Insufficient documentation

## 2011-06-03 DIAGNOSIS — M25659 Stiffness of unspecified hip, not elsewhere classified: Secondary | ICD-10-CM | POA: Insufficient documentation

## 2011-06-03 DIAGNOSIS — R269 Unspecified abnormalities of gait and mobility: Secondary | ICD-10-CM | POA: Insufficient documentation

## 2011-06-06 ENCOUNTER — Ambulatory Visit: Payer: 59 | Admitting: Physical Therapy

## 2011-06-09 ENCOUNTER — Ambulatory Visit (HOSPITAL_COMMUNITY)
Admission: RE | Admit: 2011-06-09 | Discharge: 2011-06-09 | Disposition: A | Discharge status: Home / Self Care | Payer: 59 | Source: Ambulatory Visit | Attending: Orthopaedic Surgery | Admitting: Orthopaedic Surgery

## 2011-06-09 DIAGNOSIS — M6740 Ganglion, unspecified site: Secondary | ICD-10-CM | POA: Insufficient documentation

## 2011-06-09 DIAGNOSIS — M25512 Pain in left shoulder: Secondary | ICD-10-CM

## 2011-06-09 DIAGNOSIS — M6250 Muscle wasting and atrophy, not elsewhere classified, unspecified site: Secondary | ICD-10-CM | POA: Insufficient documentation

## 2011-06-09 DIAGNOSIS — M25519 Pain in unspecified shoulder: Secondary | ICD-10-CM | POA: Insufficient documentation

## 2011-06-09 DIAGNOSIS — M19019 Primary osteoarthritis, unspecified shoulder: Secondary | ICD-10-CM | POA: Insufficient documentation

## 2011-06-11 ENCOUNTER — Ambulatory Visit: Payer: 59 | Admitting: Physical Therapy

## 2011-06-13 ENCOUNTER — Ambulatory Visit: Payer: 59 | Admitting: Physical Therapy

## 2011-06-17 ENCOUNTER — Ambulatory Visit: Payer: 59 | Admitting: Physical Therapy

## 2011-06-23 ENCOUNTER — Ambulatory Visit: Payer: 59 | Admitting: Physical Therapy

## 2011-06-23 ENCOUNTER — Encounter: Payer: 59 | Admitting: Physical Therapy

## 2011-06-25 ENCOUNTER — Encounter: Payer: 59 | Admitting: Physical Therapy

## 2011-06-26 ENCOUNTER — Encounter: Payer: 59 | Admitting: Physical Therapy

## 2011-06-27 ENCOUNTER — Ambulatory Visit: Payer: 59

## 2011-06-27 ENCOUNTER — Encounter: Payer: 59 | Admitting: Physical Therapy

## 2011-06-30 ENCOUNTER — Ambulatory Visit: Payer: 59 | Attending: Physical Medicine and Rehabilitation | Admitting: Physical Therapy

## 2011-06-30 DIAGNOSIS — M25659 Stiffness of unspecified hip, not elsewhere classified: Secondary | ICD-10-CM | POA: Insufficient documentation

## 2011-06-30 DIAGNOSIS — M25559 Pain in unspecified hip: Secondary | ICD-10-CM | POA: Insufficient documentation

## 2011-06-30 DIAGNOSIS — IMO0001 Reserved for inherently not codable concepts without codable children: Secondary | ICD-10-CM | POA: Insufficient documentation

## 2011-06-30 DIAGNOSIS — R269 Unspecified abnormalities of gait and mobility: Secondary | ICD-10-CM | POA: Insufficient documentation

## 2011-06-30 DIAGNOSIS — M6281 Muscle weakness (generalized): Secondary | ICD-10-CM | POA: Insufficient documentation

## 2011-07-02 ENCOUNTER — Ambulatory Visit: Payer: 59 | Admitting: Physical Therapy

## 2011-07-08 ENCOUNTER — Ambulatory Visit: Payer: 59 | Admitting: Physical Therapy

## 2011-07-08 ENCOUNTER — Encounter (HOSPITAL_COMMUNITY): Payer: Self-pay

## 2011-07-10 ENCOUNTER — Encounter: Payer: 59 | Admitting: Physical Therapy

## 2011-07-11 ENCOUNTER — Ambulatory Visit: Payer: 59 | Admitting: Physical Therapy

## 2011-07-18 ENCOUNTER — Encounter (HOSPITAL_COMMUNITY)
Admission: RE | Admit: 2011-07-18 | Discharge: 2011-07-18 | Disposition: A | Discharge status: Home / Self Care | Payer: 59 | Source: Ambulatory Visit | Attending: Neurological Surgery | Admitting: Neurological Surgery

## 2011-07-18 ENCOUNTER — Encounter (HOSPITAL_COMMUNITY): Payer: Self-pay

## 2011-07-18 HISTORY — DX: Pneumonia, unspecified organism: J18.9

## 2011-07-18 LAB — DIFFERENTIAL
Basophils Absolute: 0 10*3/uL (ref 0.0–0.1)
Basophils Relative: 0 % (ref 0–1)
Eosinophils Absolute: 0.2 10*3/uL (ref 0.0–0.7)
Eosinophils Relative: 4 % (ref 0–5)
Lymphocytes Relative: 34 % (ref 12–46)
Lymphs Abs: 1.9 10*3/uL (ref 0.7–4.0)
Monocytes Absolute: 1 10*3/uL (ref 0.1–1.0)
Monocytes Relative: 18 % — ABNORMAL HIGH (ref 3–12)
Neutro Abs: 2.5 10*3/uL (ref 1.7–7.7)
Neutrophils Relative %: 44 % (ref 43–77)

## 2011-07-18 LAB — COMPREHENSIVE METABOLIC PANEL
ALT: 10 U/L (ref 0–53)
AST: 32 U/L (ref 0–37)
Albumin: 3.6 g/dL (ref 3.5–5.2)
Alkaline Phosphatase: 78 U/L (ref 39–117)
BUN: 17 mg/dL (ref 6–23)
CO2: 32 mEq/L (ref 19–32)
Calcium: 9.7 mg/dL (ref 8.4–10.5)
Chloride: 100 mEq/L (ref 96–112)
Creatinine, Ser: 0.69 mg/dL (ref 0.50–1.35)
GFR calc Af Amer: >90 mL/min (ref >90)
GFR calc non Af Amer: >90 mL/min (ref >90)
Glucose, Bld: 90 mg/dL (ref 70–99)
Potassium: 4.2 mEq/L (ref 3.5–5.1)
Sodium: 139 mEq/L (ref 135–145)
Total Bilirubin: 0.3 mg/dL (ref 0.3–1.2)
Total Protein: 7.2 g/dL (ref 6.0–8.3)

## 2011-07-18 LAB — APTT: aPTT: 30 seconds (ref 24–37)

## 2011-07-18 LAB — CBC
HCT: 40 % (ref 39.0–52.0)
Hemoglobin: 13.9 g/dL (ref 13.0–17.0)
MCH: 29.4 pg (ref 26.0–34.0)
MCHC: 34.8 g/dL (ref 30.0–36.0)
MCV: 84.6 fL (ref 78.0–100.0)
Platelets: 144 10*3/uL — ABNORMAL LOW (ref 150–400)
RBC: 4.73 MIL/uL (ref 4.22–5.81)
RDW: 14.2 % (ref 11.5–15.5)
WBC: 5.7 10*3/uL (ref 4.0–10.5)

## 2011-07-18 LAB — PROTIME-INR
INR: 1.04 (ref 0.00–1.49)
Prothrombin Time: 13.8 seconds (ref 11.6–15.2)

## 2011-07-18 LAB — SURGICAL PCR SCREEN
MRSA, PCR: NEGATIVE
Staphylococcus aureus: POSITIVE — AB

## 2011-07-18 NOTE — Pre-Procedure Instructions (Signed)
20 [address]  07/18/2011   Your procedure is scheduled on:  07/24/11  Report to [facility] at 1:00 PM.  Call this number if you have problems the morning of surgery: [phone]   Remember:   Do not eat food:After Midnight.  May have clear liquids: up to 4 Hours before arrival.  Clear liquids include soda, tea, black coffee, apple or grape juice, broth.  Take these medicines the morning of surgery with A SIP OF WATER: Depakote, Lamictal, Vyanse, Oxycodone, Tramadol   Do not wear jewelry, make-up or nail polish.  Do not wear lotions, powders, or perfumes. You may wear deodorant.  Do not shave 48 hours prior to surgery.  Do not bring valuables to the hospital.  Contacts, dentures or bridgework may not be worn into surgery.  Leave suitcase in the car. After surgery it may be brought to your room.  For patients admitted to the hospital, checkout time is 11:00 AM the day of discharge.   Patients discharged the day of surgery will not be allowed to drive home.  Name and phone number of your driver: spouse Margot cell [phone] cell   Special Instructions: CHG Shower Use Special Wash: 1/2 bottle night before surgery and 1/2 bottle morning of surgery.   Please read over the following fact sheets that you were given: Pain Booklet, Coughing and Deep Breathing, MRSA Information and Surgical Site Infection Prevention

## 2011-07-23 MED ORDER — CEFAZOLIN SODIUM 1-5 GM-% IV SOLN
1.0000 g | INTRAVENOUS | Status: AC
  Administered 2011-07-24: 1 g via INTRAVENOUS
  Filled 2011-07-23: qty 50

## 2011-07-23 NOTE — Progress Notes (Signed)
Spoke with pt ... He needs to arrive at 0940.Marland Kitchen Also pt told to take no over the counter drugs (he asked).Marland Kitchen And went over again what he should take.. Lamictal, vyvanse,oxycodone and tramadol if  needed for pain .Marland Kitchen. "with a sip of water"

## 2011-07-24 ENCOUNTER — Encounter (HOSPITAL_COMMUNITY): Payer: Self-pay | Admitting: *Deleted

## 2011-07-24 ENCOUNTER — Encounter (HOSPITAL_COMMUNITY)
Admission: RE | Disposition: A | Discharge status: Home / Self Care | Payer: Self-pay | Source: Ambulatory Visit | Attending: Neurological Surgery

## 2011-07-24 ENCOUNTER — Inpatient Hospital Stay (HOSPITAL_COMMUNITY)
Admission: RE | Admit: 2011-07-24 | Discharge: 2011-07-29 | DRG: 490 | Disposition: A | Discharge status: Home / Self Care | Payer: 59 | Source: Ambulatory Visit | Attending: Neurological Surgery | Admitting: Neurological Surgery

## 2011-07-24 ENCOUNTER — Inpatient Hospital Stay (HOSPITAL_COMMUNITY): Payer: 59

## 2011-07-24 ENCOUNTER — Encounter (HOSPITAL_COMMUNITY): Payer: Self-pay | Admitting: Anesthesiology

## 2011-07-24 ENCOUNTER — Inpatient Hospital Stay (HOSPITAL_COMMUNITY): Payer: 59 | Admitting: Anesthesiology

## 2011-07-24 DIAGNOSIS — M5126 Other intervertebral disc displacement, lumbar region: Principal | ICD-10-CM | POA: Diagnosis present

## 2011-07-24 DIAGNOSIS — B1920 Unspecified viral hepatitis C without hepatic coma: Secondary | ICD-10-CM | POA: Diagnosis present

## 2011-07-24 DIAGNOSIS — G9741 Accidental puncture or laceration of dura during a procedure: Secondary | ICD-10-CM | POA: Diagnosis present

## 2011-07-24 DIAGNOSIS — F988 Other specified behavioral and emotional disorders with onset usually occurring in childhood and adolescence: Secondary | ICD-10-CM | POA: Diagnosis present

## 2011-07-24 DIAGNOSIS — IMO0002 Reserved for concepts with insufficient information to code with codable children: Secondary | ICD-10-CM | POA: Diagnosis present

## 2011-07-24 DIAGNOSIS — Z01812 Encounter for preprocedural laboratory examination: Secondary | ICD-10-CM

## 2011-07-24 DIAGNOSIS — F319 Bipolar disorder, unspecified: Secondary | ICD-10-CM | POA: Diagnosis present

## 2011-07-24 DIAGNOSIS — M5416 Radiculopathy, lumbar region: Secondary | ICD-10-CM

## 2011-07-24 HISTORY — PX: LUMBAR LAMINECTOMY/DECOMPRESSION MICRODISCECTOMY: SHX5026

## 2011-07-24 SURGERY — LUMBAR LAMINECTOMY/DECOMPRESSION MICRODISCECTOMY
Anesthesia: General | Site: Back | Laterality: Bilateral | Wound class: Clean

## 2011-07-24 MED ORDER — CYCLOBENZAPRINE HCL 10 MG PO TABS
10.0000 mg | ORAL_TABLET | Freq: Three times a day (TID) | ORAL | Status: DC | PRN
  Administered 2011-07-25 – 2011-07-29 (×8): 10 mg via ORAL
  Filled 2011-07-24 (×8): qty 1

## 2011-07-24 MED ORDER — SENNA 8.6 MG PO TABS
1.0000 | ORAL_TABLET | Freq: Two times a day (BID) | ORAL | Status: DC
  Administered 2011-07-24 – 2011-07-29 (×9): 8.6 mg via ORAL
  Filled 2011-07-24 (×11): qty 1

## 2011-07-24 MED ORDER — ACETAMINOPHEN 325 MG PO TABS: 650.0000 mg | ORAL_TABLET | ORAL | Status: DC | PRN

## 2011-07-24 MED ORDER — HYDROMORPHONE HCL PF 1 MG/ML IJ SOLN
0.5000 mg | INTRAMUSCULAR | Status: DC | PRN
  Administered 2011-07-24 – 2011-07-29 (×48): 1 mg via INTRAVENOUS
  Filled 2011-07-24 (×48): qty 1

## 2011-07-24 MED ORDER — CEFAZOLIN SODIUM 1-5 GM-% IV SOLN
1.0000 g | Freq: Three times a day (TID) | INTRAVENOUS | Status: AC
  Administered 2011-07-24 – 2011-07-25 (×2): 1 g via INTRAVENOUS
  Filled 2011-07-24 (×2): qty 50

## 2011-07-24 MED ORDER — TAMSULOSIN HCL 0.4 MG PO CAPS
0.4000 mg | ORAL_CAPSULE | Freq: Every day | ORAL | Status: DC
  Administered 2011-07-25 – 2011-07-29 (×5): 0.4 mg via ORAL
  Filled 2011-07-24 (×6): qty 1

## 2011-07-24 MED ORDER — LACTATED RINGERS IV SOLN
INTRAVENOUS | Status: DC | PRN
  Administered 2011-07-24 (×2): via INTRAVENOUS

## 2011-07-24 MED ORDER — MIDAZOLAM HCL 5 MG/5ML IJ SOLN
INTRAMUSCULAR | Status: DC | PRN
  Administered 2011-07-24: 2 mg via INTRAVENOUS

## 2011-07-24 MED ORDER — HYDROMORPHONE HCL PF 1 MG/ML IJ SOLN
INTRAMUSCULAR | Status: AC
  Filled 2011-07-24: qty 1

## 2011-07-24 MED ORDER — NEOSTIGMINE METHYLSULFATE 1 MG/ML IJ SOLN
INTRAMUSCULAR | Status: DC | PRN
  Administered 2011-07-24: 4.5 mg via INTRAVENOUS

## 2011-07-24 MED ORDER — MENTHOL 3 MG MT LOZG: 1.0000 | LOZENGE | OROMUCOSAL | Status: DC | PRN

## 2011-07-24 MED ORDER — AMPHETAMINE-DEXTROAMPHET ER 30 MG PO CP24: 30.0000 mg | ORAL_CAPSULE | Freq: Every day | ORAL | Status: DC

## 2011-07-24 MED ORDER — HYDROMORPHONE HCL PF 1 MG/ML IJ SOLN
0.2500 mg | INTRAMUSCULAR | Status: DC | PRN
  Administered 2011-07-24 (×4): 0.5 mg via INTRAVENOUS

## 2011-07-24 MED ORDER — DEXAMETHASONE SODIUM PHOSPHATE 10 MG/ML IJ SOLN
INTRAMUSCULAR | Status: AC
  Filled 2011-07-24: qty 1

## 2011-07-24 MED ORDER — LISDEXAMFETAMINE DIMESYLATE 30 MG PO CAPS: 60.0000 mg | ORAL_CAPSULE | Freq: Every day | ORAL | Status: DC

## 2011-07-24 MED ORDER — ONDANSETRON HCL 4 MG/2ML IJ SOLN
4.0000 mg | INTRAMUSCULAR | Status: DC | PRN
  Administered 2011-07-25 – 2011-07-27 (×2): 4 mg via INTRAVENOUS
  Filled 2011-07-24 (×2): qty 2

## 2011-07-24 MED ORDER — SODIUM CHLORIDE 0.9 % IV SOLN
INTRAVENOUS | Status: AC
  Filled 2011-07-24: qty 500

## 2011-07-24 MED ORDER — DEXAMETHASONE 4 MG PO TABS
4.0000 mg | ORAL_TABLET | Freq: Four times a day (QID) | ORAL | Status: DC
  Administered 2011-07-25 – 2011-07-29 (×14): 4 mg via ORAL
  Filled 2011-07-24 (×23): qty 1

## 2011-07-24 MED ORDER — OXYCODONE-ACETAMINOPHEN 5-325 MG PO TABS
1.0000 | ORAL_TABLET | ORAL | Status: DC | PRN
  Administered 2011-07-24: 2 via ORAL
  Filled 2011-07-24: qty 2

## 2011-07-24 MED ORDER — HEMOSTATIC AGENTS (NO CHARGE) OPTIME
TOPICAL | Status: DC | PRN
  Administered 2011-07-24: 1 via TOPICAL

## 2011-07-24 MED ORDER — ONDANSETRON HCL 4 MG/2ML IJ SOLN: 4.0000 mg | Freq: Four times a day (QID) | INTRAMUSCULAR | Status: DC | PRN

## 2011-07-24 MED ORDER — ACETAMINOPHEN 650 MG RE SUPP: 650.0000 mg | RECTAL | Status: DC | PRN

## 2011-07-24 MED ORDER — EPHEDRINE SULFATE 50 MG/ML IJ SOLN
INTRAMUSCULAR | Status: DC | PRN
  Administered 2011-07-24 (×2): 10 mg via INTRAVENOUS

## 2011-07-24 MED ORDER — THROMBIN 5000 UNITS EX KIT
PACK | CUTANEOUS | Status: DC | PRN
  Administered 2011-07-24 (×4): 5000 [IU] via TOPICAL

## 2011-07-24 MED ORDER — DEXAMETHASONE SODIUM PHOSPHATE 10 MG/ML IJ SOLN
10.0000 mg | Freq: Once | INTRAMUSCULAR | Status: DC
  Filled 2011-07-24: qty 1

## 2011-07-24 MED ORDER — POTASSIUM CHLORIDE IN NACL 20-0.9 MEQ/L-% IV SOLN
INTRAVENOUS | Status: DC
  Administered 2011-07-24 – 2011-07-26 (×3): via INTRAVENOUS
  Filled 2011-07-24 (×11): qty 1000

## 2011-07-24 MED ORDER — ONDANSETRON HCL 4 MG/2ML IJ SOLN
INTRAMUSCULAR | Status: DC | PRN
  Administered 2011-07-24: 4 mg via INTRAVENOUS

## 2011-07-24 MED ORDER — LAMOTRIGINE 100 MG PO TABS
100.0000 mg | ORAL_TABLET | Freq: Every day | ORAL | Status: DC
  Administered 2011-07-25 – 2011-07-29 (×5): 100 mg via ORAL
  Filled 2011-07-24 (×5): qty 1

## 2011-07-24 MED ORDER — DEXAMETHASONE SODIUM PHOSPHATE 4 MG/ML IJ SOLN
4.0000 mg | Freq: Four times a day (QID) | INTRAMUSCULAR | Status: DC
  Administered 2011-07-24 – 2011-07-27 (×5): 4 mg via INTRAVENOUS
  Filled 2011-07-24 (×23): qty 1

## 2011-07-24 MED ORDER — THROMBIN 5000 UNITS EX KIT
PACK | OROMUCOSAL | Status: DC | PRN
  Administered 2011-07-24: 13:00:00 via TOPICAL

## 2011-07-24 MED ORDER — PHENOL 1.4 % MT LIQD: 1.0000 | OROMUCOSAL | Status: DC | PRN

## 2011-07-24 MED ORDER — SODIUM CHLORIDE 0.9 % IV SOLN: 250.0000 mL | INTRAVENOUS | Status: DC

## 2011-07-24 MED ORDER — DIVALPROEX SODIUM 500 MG PO DR TAB
2000.0000 mg | DELAYED_RELEASE_TABLET | Freq: Every evening | ORAL | Status: DC
  Administered 2011-07-24 – 2011-07-28 (×5): 2000 mg via ORAL
  Filled 2011-07-24 (×6): qty 4

## 2011-07-24 MED ORDER — ROCURONIUM BROMIDE 100 MG/10ML IV SOLN
INTRAVENOUS | Status: DC | PRN
  Administered 2011-07-24: 10 mg via INTRAVENOUS
  Administered 2011-07-24: 50 mg via INTRAVENOUS

## 2011-07-24 MED ORDER — STERILE WATER FOR IRRIGATION IR SOLN
Status: DC | PRN
  Administered 2011-07-24: 1000 mL

## 2011-07-24 MED ORDER — SODIUM CHLORIDE 0.9 % IR SOLN
Status: DC | PRN
  Administered 2011-07-24: 12:00:00

## 2011-07-24 MED ORDER — SODIUM CHLORIDE 0.9 % IJ SOLN
3.0000 mL | INTRAMUSCULAR | Status: DC | PRN
  Administered 2011-07-27: 3 mL via INTRAVENOUS

## 2011-07-24 MED ORDER — SUFENTANIL CITRATE 50 MCG/ML IV SOLN
INTRAVENOUS | Status: DC | PRN
  Administered 2011-07-24: 10 ug via INTRAVENOUS
  Administered 2011-07-24: 20 ug via INTRAVENOUS
  Administered 2011-07-24: 10 ug via INTRAVENOUS

## 2011-07-24 MED ORDER — SODIUM CHLORIDE 0.9 % IJ SOLN
3.0000 mL | Freq: Two times a day (BID) | INTRAMUSCULAR | Status: DC
  Administered 2011-07-26 – 2011-07-29 (×5): 3 mL via INTRAVENOUS

## 2011-07-24 MED ORDER — 0.9 % SODIUM CHLORIDE (POUR BTL) OPTIME
TOPICAL | Status: DC | PRN
  Administered 2011-07-24: 1000 mL

## 2011-07-24 MED ORDER — DEXAMETHASONE SODIUM PHOSPHATE 10 MG/ML IJ SOLN
INTRAMUSCULAR | Status: DC | PRN
  Administered 2011-07-24: 10 mg via INTRAVENOUS

## 2011-07-24 MED ORDER — GLYCOPYRROLATE 0.2 MG/ML IJ SOLN
INTRAMUSCULAR | Status: DC | PRN
  Administered 2011-07-24: .7 mg via INTRAVENOUS

## 2011-07-24 MED ORDER — PROPOFOL 10 MG/ML IV EMUL
INTRAVENOUS | Status: DC | PRN
  Administered 2011-07-24: 190 mg via INTRAVENOUS

## 2011-07-24 MED ORDER — BUPIVACAINE HCL (PF) 0.25 % IJ SOLN
INTRAMUSCULAR | Status: DC | PRN
  Administered 2011-07-24: 30 mL

## 2011-07-24 MED ORDER — BACITRACIN 50000 UNITS IM SOLR
INTRAMUSCULAR | Status: AC
  Filled 2011-07-24: qty 50000

## 2011-07-24 MED ORDER — ZOLPIDEM TARTRATE 10 MG PO TABS: 10.0000 mg | ORAL_TABLET | Freq: Every evening | ORAL | Status: DC | PRN

## 2011-07-24 SURGICAL SUPPLY — 57 items
APL SKNCLS STERI-STRIP NONHPOA (GAUZE/BANDAGES/DRESSINGS) ×1
BAG DECANTER FOR FLEXI CONT (MISCELLANEOUS) ×2 IMPLANT
BENZOIN TINCTURE PRP APPL 2/3 (GAUZE/BANDAGES/DRESSINGS) ×2 IMPLANT
BUR MATCHSTICK NEURO 3.0 LAGG (BURR) ×2 IMPLANT
CANISTER SUCTION 2500CC (MISCELLANEOUS) ×2 IMPLANT
CLOTH BEACON ORANGE TIMEOUT ST (SAFETY) ×2 IMPLANT
CONT SPEC 4OZ CLIKSEAL STRL BL (MISCELLANEOUS) ×2 IMPLANT
DRAPE LAPAROTOMY 100X72X124 (DRAPES) ×2 IMPLANT
DRAPE MICROSCOPE LEICA (MISCELLANEOUS) ×1 IMPLANT
DRAPE MICROSCOPE ZEISS OPMI (DRAPES) ×1 IMPLANT
DRAPE POUCH INSTRU U-SHP 10X18 (DRAPES) ×2 IMPLANT
DRAPE SURG 17X23 STRL (DRAPES) ×2 IMPLANT
DRESSING TELFA 8X3 (GAUZE/BANDAGES/DRESSINGS) ×3 IMPLANT
DRSG OPSITE 4X5.5 SM (GAUZE/BANDAGES/DRESSINGS) ×2 IMPLANT
DURAPREP 26ML APPLICATOR (WOUND CARE) ×2 IMPLANT
DURASEAL 3ML ×1 IMPLANT
DURASEAL EXTEND TIP ×1 IMPLANT
ELECT REM PT RETURN 9FT ADLT (ELECTROSURGICAL) ×2
ELECTRODE REM PT RTRN 9FT ADLT (ELECTROSURGICAL) ×1 IMPLANT
GAUZE SPONGE 4X4 16PLY XRAY LF (GAUZE/BANDAGES/DRESSINGS) IMPLANT
GLOVE BIO SURGEON STRL SZ7.5 (GLOVE) ×1 IMPLANT
GLOVE BIO SURGEON STRL SZ8 (GLOVE) ×3 IMPLANT
GLOVE BIOGEL PI IND STRL 7.0 (GLOVE) IMPLANT
GLOVE BIOGEL PI INDICATOR 7.0 (GLOVE) ×2
GLOVE ECLIPSE 7.5 STRL STRAW (GLOVE) ×1 IMPLANT
GLOVE EXAM NITRILE MD LF STRL (GLOVE) ×1 IMPLANT
GLOVE INDICATOR 6.5 STRL GRN (GLOVE) ×1 IMPLANT
GLOVE INDICATOR 7.0 STRL GRN (GLOVE) ×1 IMPLANT
GLOVE INDICATOR 8.5 STRL (GLOVE) ×1 IMPLANT
GLOVE SURG SS PI 6.5 STRL IVOR (GLOVE) ×2 IMPLANT
GOWN BRE IMP SLV AUR LG STRL (GOWN DISPOSABLE) ×2 IMPLANT
GOWN BRE IMP SLV AUR XL STRL (GOWN DISPOSABLE) ×2 IMPLANT
GOWN STRL REIN 2XL LVL4 (GOWN DISPOSABLE) IMPLANT
HEMOSTAT POWDER KIT SURGIFOAM (HEMOSTASIS) ×1 IMPLANT
KIT BASIN OR (CUSTOM PROCEDURE TRAY) ×2 IMPLANT
KIT ROOM TURNOVER OR (KITS) ×2 IMPLANT
NDL HYPO 25X1 1.5 SAFETY (NEEDLE) ×1 IMPLANT
NDL SPNL 20GX3.5 QUINCKE YW (NEEDLE) IMPLANT
NEEDLE HYPO 25X1 1.5 SAFETY (NEEDLE) ×4 IMPLANT
NEEDLE SPNL 20GX3.5 QUINCKE YW (NEEDLE) ×2 IMPLANT
NS IRRIG 1000ML POUR BTL (IV SOLUTION) ×2 IMPLANT
PACK LAMINECTOMY NEURO (CUSTOM PROCEDURE TRAY) ×2 IMPLANT
PAD ARMBOARD 7.5X6 YLW CONV (MISCELLANEOUS) ×6 IMPLANT
PATTIES SURGICAL .5 X.5 (GAUZE/BANDAGES/DRESSINGS) ×1 IMPLANT
RUBBERBAND STERILE (MISCELLANEOUS) ×4 IMPLANT
SPONGE GAUZE 4X4 12PLY (GAUZE/BANDAGES/DRESSINGS) ×1 IMPLANT
SPONGE SURGIFOAM ABS GEL SZ50 (HEMOSTASIS) ×3 IMPLANT
STRIP CLOSURE SKIN 1/2X4 (GAUZE/BANDAGES/DRESSINGS) ×2 IMPLANT
SUT PROLENE 6 0 BV (SUTURE) ×3 IMPLANT
SUT VIC AB 0 CT1 18XCR BRD8 (SUTURE) ×1 IMPLANT
SUT VIC AB 0 CT1 8-18 (SUTURE) ×4
SUT VIC AB 2-0 CP2 18 (SUTURE) ×3 IMPLANT
SUT VIC AB 3-0 SH 8-18 (SUTURE) ×3 IMPLANT
SYR 20ML ECCENTRIC (SYRINGE) ×2 IMPLANT
TOWEL OR 17X24 6PK STRL BLUE (TOWEL DISPOSABLE) ×2 IMPLANT
TOWEL OR 17X26 10 PK STRL BLUE (TOWEL DISPOSABLE) ×2 IMPLANT
WATER STERILE IRR 1000ML POUR (IV SOLUTION) ×2 IMPLANT

## 2011-07-24 NOTE — H&P (Signed)
Subjective: Patient is a 55 y.o. male admitted for DLL L2-3, L3-4, L4-5. Onset of symptoms was several years ago, gradually worsening since that time.  The pain is rated moderate, and is located at the across the lower back and radiates to legs. The pain is described as aching and throbbing and occurs intermittently. The symptoms have been progressive. Symptoms are exacerbated by walking for more than a few minutes. MRI or CT showed stenosis.     Past Medical History  Diagnosis Date  . Hepatitis C   . Hemorrhoids   . Abscess of lung     Right lower lobe  . Empyema lung   . ADD (attention deficit disorder)   . Bipolar depression   . Pneumonia     last year    Past Surgical History  Procedure Date  . Foot neuroma surgery 2006    Left  . Elbow surgery 2005    Right  . Lung surgery     for empyema  . Hip resurfacing     left hip at [facility] 01/2011    Prior to Admission medications   Medication Sig Start Date End Date Taking? Authorizing Provider  b complex vitamins tablet Take 1 tablet by mouth daily.     Yes Historical Provider, MD  divalproex (DEPAKOTE) 500 MG EC tablet Take 2,000 mg by mouth every evening.    Yes Historical Provider, MD  Glucosamine-Chondroit-Vit C-Mn (GLUCOSAMINE CHONDROITIN COMPLX) CAPS Take 2 capsules by mouth daily.    Yes Historical Provider, MD  Boris Lown Oil 300 MG CAPS Take 1 capsule by mouth daily.     Yes Historical Provider, MD  lamoTRIgine (LAMICTAL) 100 MG tablet Take 100 mg by mouth daily.     Yes Historical Provider, MD  lisdexamfetamine (VYVANSE) 60 MG capsule Take 60 mg by mouth daily.     Yes Historical Provider, MD  Multiple Vitamin (MULTIVITAMIN) capsule Take 1 capsule by mouth daily.     Yes Historical Provider, MD  Oxycodone HCl 10 MG TABS Take 10 mg by mouth every 6 (six) hours as needed. For pain.    Yes Historical Provider, MD  Tamsulosin HCl (FLOMAX) 0.4 MG CAPS Take 0.4 mg by mouth daily.     Yes Historical Provider, MD  traMADol (ULTRAM) 50  MG tablet Take 50-100 mg by mouth every 6 (six) hours as needed. For pain.   Yes Historical Provider, MD   No Known Allergies  History  Substance Use Topics  . Smoking status: Never Smoker   . Smokeless tobacco: Never Used  . Alcohol Use: No     quit drinking in 1989    Family History  Problem Relation Age of Onset  . COPD Mother   . Heart disease Mother   . Hypertension Mother   . Coronary artery disease Father   . Dementia Father   . Heart failure Father   . Prostate cancer Paternal Grandfather     also had bone cancer  . Breast cancer Maternal Grandmother   . Cancer Paternal Grandmother     unsure what kind     Review of Systems  Positive ROS: neg  All other systems have been reviewed and were otherwise negative with the exception of those mentioned in the HPI and as above.  Objective: Vital signs in last 24 hours: Temp:  [98.2 F (36.8 C)] 98.2 F (36.8 C) (12/27 1041) Pulse Rate:  [67] 67  (12/27 1041) Resp:  [22] 22  (12/27 1041) BP: (104)/(70)  104/70 mmHg (12/27 1041) SpO2:  [99 %] 99 % (12/27 1041)  General Appearance: Alert, cooperative, no distress, appears stated age Head: Normocephalic, without obvious abnormality, atraumatic Eyes: PERRL, conjunctiva/corneas clear, EOM's intact, fundi benign, both eyes      Ears: Normal TM's and external ear canals, both ears Throat: Lips, mucosa, and tongue normal; teeth and gums normal Neck: Supple, symmetrical, trachea midline, no adenopathy; thyroid: No enlargement/tenderness/nodules; no carotid bruit or JVD Back: Symmetric, no curvature, ROM normal, no CVA tenderness Lungs: Clear to auscultation bilaterally, respirations unlabored Heart: Regular rate and rhythm, S1 and S2 normal, no murmur, rub or gallop Abdomen: Soft, non-tender, bowel sounds active all four quadrants, no masses, no organomegaly Extremities: Extremities normal, atraumatic, no cyanosis or edema Pulses: 2+ and symmetric all extremities Skin: Skin  color, texture, turgor normal, no rashes or lesions  NEUROLOGIC:   Mental status: Alert and oriented x4,  no aphasia, good attention span, fund of knowledge, and memory Motor Exam - grossly normal Sensory Exam - grossly normal Reflexes: nl Coordination - grossly normal Gait - grossly normal Balance - grossly normal Cranial Nerves: I: smell Not tested  II: visual acuity  OS: nl    OD: nl  II: visual fields Full to confrontation  II: pupils Equal, round, reactive to light  III,VII: ptosis None  III,IV,VI: extraocular muscles  Full ROM  V: mastication Normal  V: facial light touch sensation  Normal  V,VII: corneal reflex  Present  VII: facial muscle function - upper  Normal  VII: facial muscle function - lower Normal  VIII: hearing Not tested  IX: soft palate elevation  Normal  IX,X: gag reflex Present  XI: trapezius strength  5/5  XI: sternocleidomastoid strength 5/5  XI: neck flexion strength  5/5  XII: tongue strength  Normal    Data Review Lab Results  Component Value Date   WBC 5.7 07/18/2011   HGB 13.9 07/18/2011   HCT 40.0 07/18/2011   MCV 84.6 07/18/2011   PLT 144* 07/18/2011   Lab Results  Component Value Date   NA 139 07/18/2011   K 4.2 07/18/2011   CL 100 07/18/2011   CO2 32 07/18/2011   BUN 17 07/18/2011   CREATININE 0.69 07/18/2011   GLUCOSE 90 07/18/2011   Lab Results  Component Value Date   INR 1.04 07/18/2011    Assessment/Plan: Patient admitted for [facility] for stenosis. Patient has failed conservative therapy.  I explained the condition and procedure to the patient and answered any questions.  Patient wishes to proceed with procedure as planned. Understands risks/ benefits and typical outcomes of procedure.   Timohty Renbarger,Abdurrahman S 07/24/2011 12:03 PM

## 2011-07-24 NOTE — Transfer of Care (Signed)
Immediate Anesthesia Transfer of Care Note  Patient: Paul Ray  Procedure(s) Performed:  LUMBAR LAMINECTOMY/DECOMPRESSION MICRODISCECTOMY - Bilateral  , Lumbar Three-Four, Lumbar Four-Five Decompressive Laminectomy Rm # [id number]  Patient Location: PACU  Anesthesia Type: General  Level of Consciousness: patient cooperative  Airway & Oxygen Therapy: Patient Spontanous Breathing and Patient connected to face mask oxygen  Post-op Assessment: Report given to PACU RN  Post vital signs: Reviewed and stable  Complications: No apparent anesthesia complications

## 2011-07-24 NOTE — Preoperative (Signed)
Beta Blockers   Reason not to administer Beta Blockers:Not Applicable 

## 2011-07-24 NOTE — Anesthesia Preprocedure Evaluation (Signed)
Anesthesia Evaluation  Patient identified by MRN, date of birth, ID band Patient awake    Reviewed: Allergy & Precautions, H&P , NPO status , Patient's Chart, lab work & pertinent test results  Airway Mallampati: II  Neck ROM: full    Dental   Pulmonary          Cardiovascular     Neuro/Psych PSYCHIATRIC DISORDERS Bipolar Disorder    GI/Hepatic (+) Hepatitis -, C  Endo/Other    Renal/GU      Musculoskeletal   Abdominal   Peds  Hematology   Anesthesia Other Findings   Reproductive/Obstetrics                           Anesthesia Physical Anesthesia Plan  ASA: II  Anesthesia Plan: General   Post-op Pain Management:    Induction: Intravenous  Airway Management Planned: Oral ETT  Additional Equipment:   Intra-op Plan:   Post-operative Plan:   Informed Consent: I have reviewed the patients History and Physical, chart, labs and discussed the procedure including the risks, benefits and alternatives for the proposed anesthesia with the patient or authorized representative who has indicated his/her understanding and acceptance.     Plan Discussed with: CRNA and Surgeon  Anesthesia Plan Comments:         Anesthesia Quick Evaluation

## 2011-07-24 NOTE — Op Note (Signed)
07/24/2011  3:03 PM  PATIENT:  Paul Ray  55 y.o. male  PRE-OPERATIVE DIAGNOSIS:  Severe lumbar spinal stenosis L3-4 L4-5 with herniated disc L3-4 level with back and leg pain  POST-OPERATIVE DIAGNOSIS:  Same  PROCEDURE:  Decompressive lumbar laminectomy L3-4 L4-5 with microdiscectomy L3-4 the left utilizing microscope dissection  SURGEON:  Marikay Alar, MD  ASSISTANTS: Dr. Gerlene Fee  ANESTHESIA:   General  EBL: 50 ml  Complications: Unintended durotomy L3-4 left, repaired primarily  Total I/O In: 1000 [I.V.:1000] Out: 200 [Blood:200]  BLOOD ADMINISTERED:none  DRAINS: none   SPECIMEN:  No Specimen  INDICATION FOR PROCEDURE: Patient presented with back and leg pain for quite a long time. He had seen 2 other spine surgeons regarding spinal stenosis found on MRI. He had severe stenosis at L3-4 and L4-5. He tried medical management for quite some time without significant relief. Patient understood the risks, benefits, and alternatives and potential outcomes and wished to proceed.  PROCEDURE DETAILS: The patient was taken to the operating room and after induction of adequate generalized endotracheal anesthesia, the patient was rolled into the prone position on the Wilson frame and all pressure points were padded. The lumbar region was cleaned and then prepped with DuraPrep and draped in the usual sterile fashion. 5 cc of local anesthesia was injected and then a dorsal midline incision was made and carried down to the lumbo sacral fascia. The fascia was opened and the paraspinous musculature was taken down in a subperiosteal fashion to expose L3-4 and L4-5 on the left. Intraoperative x-ray confirmed my level, and then I used a combination of the high-speed drill and the Kerrison punches to perform a hemilaminectomy, medial facetectomy, and foraminotomy at L3-4 and L4-5 on the left. The underlying yellow ligament was opened and removed in a piecemeal fashion to expose the underlying dura  and exiting nerve root. I do under the lamina and spinous process for the sublaminar decompression at both levels to decompress the opposite lateral recess.I undercut the lateral recess and dissected down until I was medial to and distal to the pedicle. The L4 and L5 nerve root was well decompressed. Unfortunately an unintended durotomy was created L3-4 on the left during the decompression. I was able to close this with a running 6-0 Prolene suture, and checked this multiple times with Valsalva to assure water tight closure. I did find a large disc herniation at L3-4 on the left. We then gently retracted the nerve root medially with a retractor, coagulated the epidural venous vasculature, and incised the disc space. A performed a thorough intradiscal discectomy with pituitary rongeurs and curettes, until I had a nice decompression of the nerve root and the midline. I then palpated with a coronary dilator along the nerve root and into the foramen to assure adequate decompression. I felt no more compression of the nerve root. I irrigated with saline solution containing bacitracin. Achieved hemostasis with bipolar cautery, lined the dura with Gelfoam and with Tisseel fibrin glue, and then closed the fascia with 0 Vicryl. I closed the subcutaneous tissues with 2-0 Vicryl and the subcuticular tissues with 3-0 Vicryl. The skin was then closed with benzoin and Steri-Strips. The drapes were removed, a sterile dressing was applied. The patient was awakened from general anesthesia and transferred to the recovery room in stable condition. At the end of the procedure all sponge, needle and instrument counts were correct.  PLAN OF CARE: Admit to inpatient   PATIENT DISPOSITION:  PACU - hemodynamically stable.  Delay start of Pharmacological VTE agent (>24hrs) due to surgical blood loss or risk of bleeding:  yes

## 2011-07-24 NOTE — Anesthesia Postprocedure Evaluation (Signed)
  Anesthesia Post-op Note  Patient: Paul Ray  Procedure(s) Performed:  LUMBAR LAMINECTOMY/DECOMPRESSION MICRODISCECTOMY - Bilateral  , Lumbar Three-Four, Lumbar Four-Five Decompressive Laminectomy Rm # [id number]  Patient Location: PACU  Anesthesia Type: General  Level of Consciousness: awake  Airway and Oxygen Therapy: Patient Spontanous Breathing  Post-op Pain: mild  Post-op Assessment: Post-op Vital signs reviewed  Post-op Vital Signs: stable  Complications: No apparent anesthesia complications

## 2011-07-25 ENCOUNTER — Inpatient Hospital Stay (HOSPITAL_COMMUNITY): Payer: 59

## 2011-07-25 ENCOUNTER — Encounter (HOSPITAL_COMMUNITY): Payer: Self-pay | Admitting: Neurological Surgery

## 2011-07-25 MED ORDER — AMPHETAMINE-DEXTROAMPHET ER 10 MG PO CP24
30.0000 mg | ORAL_CAPSULE | Freq: Every day | ORAL | Status: DC
  Administered 2011-07-25 – 2011-07-29 (×5): 30 mg via ORAL
  Filled 2011-07-25: qty 3
  Filled 2011-07-25: qty 2
  Filled 2011-07-25: qty 3
  Filled 2011-07-25: qty 1
  Filled 2011-07-25 (×2): qty 3

## 2011-07-25 MED ORDER — OXYCODONE HCL 5 MG PO TABS
10.0000 mg | ORAL_TABLET | ORAL | Status: DC | PRN
  Administered 2011-07-25 – 2011-07-29 (×20): 10 mg via ORAL
  Filled 2011-07-25 (×22): qty 2

## 2011-07-25 MED ORDER — ALUM & MAG HYDROXIDE-SIMETH 200-200-20 MG/5ML PO SUSP
30.0000 mL | ORAL | Status: DC | PRN
  Administered 2011-07-25 – 2011-07-27 (×3): 30 mL via ORAL
  Filled 2011-07-25 (×3): qty 30

## 2011-07-25 MED ORDER — ALUM & MAG HYDROXIDE-SIMETH 200-200-20 MG/5ML PO SUSP: 30.0000 mL | ORAL | Status: DC | PRN

## 2011-07-25 NOTE — Progress Notes (Signed)
Nursing- Pt c/o pain in abdomen and the inability to void. When asked if he had voided since surgery he said no. Bladder scan showed , and upon straight cathing pt per order, was drained. Pt continued to c/o not being about to void and abdominal pain in the morning. Bladder scan revealed . Will bladder scan again in one hour and cont to monitor.

## 2011-07-25 NOTE — Progress Notes (Signed)
Patient ID: Paul Ray, male   DOB: [birth year], 55 y.o.   MRN: [id number] Subjective: Patient reports back pain, some difficulty with urination, no leg pain, some leg numbness  Objective: Vital signs in last 24 hours: Temp:  [97 F (36.1 C)-98.6 F (37 C)] 98.6 F (37 C) (12/28 0600) Pulse Rate:  [67-92] 80  (12/28 0600) Resp:  [9-26] 20  (12/28 0600) BP: (85-127)/(40-79) 117/75 mmHg (12/28 0600) SpO2:  [94 %-100 %] 97 % (12/28 0600) Weight:  [75.297 kg (166 lb)] 166 lb (75.297 kg) (12/28 0553)  Intake/Output from previous day: 12/27 0701 - 12/28 0700 In: 2785 [I.V.:2785] Out: 1200 [Urine:1000; Blood:200] Intake/Output this shift:    Neurologic: Grossly normal Seems to have good LE strength  Lab Results: Lab Results  Component Value Date   WBC 5.7 07/18/2011   HGB 13.9 07/18/2011   HCT 40.0 07/18/2011   MCV 84.6 07/18/2011   PLT 144* 07/18/2011   Lab Results  Component Value Date   INR 1.04 07/18/2011   BMET Lab Results  Component Value Date   NA 139 07/18/2011   K 4.2 07/18/2011   CL 100 07/18/2011   CO2 32 07/18/2011   GLUCOSE 90 07/18/2011   BUN 17 07/18/2011   CREATININE 0.69 07/18/2011   CALCIUM 9.7 07/18/2011    Studies/Results: Dg Lumbar Spine 1 View  07/24/2011  *RADIOLOGY REPORT*  Clinical Data: Bilateral L3-L5 decompression laminectomy.  LUMBAR SPINE - 1 VIEW  Comparison: Two-view lumbar spine 06/12/2011.  MRI of the lumbar spine 05/15/2011.  Findings: A single intraoperative cross-table images submitted.  It is labeled 12:50 p.m.  A surgical probe is directed at the L4-5 interspace.  IMPRESSION: Intraoperative localization of L4-5.  Original Report Authenticated By: Jamesetta Orleans. MATTERN, M.D.    Assessment/Plan: Flat bedrest for another 24 hrs, pain control, foley   LOS: 1 day    Paul Ray,Paul Ray 07/25/2011, 7:58 AM

## 2011-07-25 NOTE — Plan of Care (Signed)
Problem: Phase I Progression Outcomes Goal: OOB as tolerated unless otherwise ordered Outcome: Not Progressing Flat bedrest secondary to dural tear

## 2011-07-25 NOTE — Progress Notes (Signed)
Pt c/o severe abdominal pain. Pt's vitals are stable, bowel sounds hypoactive, and abdomen is soft and tender to touch. Pt guarding abdomen. Pain medication not relieving pain per patient. Dr. Wynetta Emery notified, orders placed. Will continue to monitor patient. Rema Fendt, RN

## 2011-07-25 NOTE — Progress Notes (Signed)
Patient ID: Paul Ray, male   DOB: 1955-11-22, 55 y.o.   MRN: [id number]   States he feels a little better tonight. No headache. No leg pain. Abd still bothering him...ileus? Xray abd ok.

## 2011-07-26 MED ORDER — BISACODYL 10 MG RE SUPP
10.0000 mg | Freq: Every day | RECTAL | Status: DC | PRN
  Administered 2011-07-27: 10 mg via RECTAL
  Filled 2011-07-26 (×2): qty 1

## 2011-07-26 NOTE — Progress Notes (Signed)
Patient ID: Paul Ray, male   DOB: September 29, 1955, 55 y.o.   MRN: [id number] Subjective: Patient reports Feeling better  Objective: Vital signs in last 24 hours: Temp:  [97.4 F (36.3 C)-98.2 F (36.8 C)] 97.7 F (36.5 C) (12/29 1011) Pulse Rate:  [71-84] 82  (12/29 1011) Resp:  [18-20] 20  (12/29 1011) BP: (136-169)/(52-83) 136/69 mmHg (12/29 1011) SpO2:  [96 %-98 %] 97 % (12/29 1011)  Intake/Output from previous day: 12/28 0701 - 12/29 0700 In: 900 [I.V.:900] Out: 4550 [Urine:4550] Intake/Output this shift: Total I/O In: 240 [P.O.:240] Out: -   Neurologically stable  Lab Results: No results found for this basename: WBC:2,HGB:2,HCT:2,PLT:2 in the last 72 hours BMET No results found for this basename: NA:2,K:2,CL:2,CO2:2,GLUCOSE:2,BUN:2,CREATININE:2,CALCIUM:2 in the last 72 hours  Studies/Results: Dg Lumbar Spine 1 View  07/24/2011  *RADIOLOGY REPORT*  Clinical Data: Bilateral L3-L5 decompression laminectomy.  LUMBAR SPINE - 1 VIEW  Comparison: Two-view lumbar spine 06/12/2011.  MRI of the lumbar spine 05/15/2011.  Findings: A single intraoperative cross-table images submitted.  It is labeled 12:50 p.m.  A surgical probe is directed at the L4-5 interspace.  IMPRESSION: Intraoperative localization of L4-5.  Original Report Authenticated By: Jamesetta Orleans. MATTERN, M.D.   Dg Abd Portable 1v  07/25/2011  *RADIOLOGY REPORT*  Clinical Data: Abdominal pain  ABDOMEN - 1 VIEW  Comparison: 07/24/2011 L-spine radiograph  Findings: Nonobstructive bowel gas pattern.  Organ outlines normal where seen.  No radiopaque foreign body.  Elevated right hemidiaphragm with a small amount of pleural fluid versus thickening.  IMPRESSION: Nonobstructive bowel gas pattern.  Original Report Authenticated By: Waneta Martins, M.D.    Assessment/Plan: Will start to slowly increase head of bed. May start to get up tomorrow  LOS: 2 days  As above   Reinaldo Meeker, MD 07/26/2011, 10:41 AM

## 2011-07-27 MED ORDER — MAGNESIUM HYDROXIDE 400 MG/5ML PO SUSP
30.0000 mL | Freq: Every day | ORAL | Status: DC | PRN
Start: 2011-07-27 — End: 2011-07-29
  Administered 2011-07-28: 30 mL via ORAL
  Filled 2011-07-27: qty 30

## 2011-07-27 NOTE — Progress Notes (Signed)
Doing well. C/o appropriate incisional soreness.  No Numbness, tingling, weakness No Nausea /vomiting - but c/o abdominal pain - No BM   Temp:  [97.6 F (36.4 C)-98.2 F (36.8 C)] 97.6 F (36.4 C) (12/30 0428) Pulse Rate:  [78-98] 78  (12/30 0428) Resp:  [16-20] 18  (12/30 0428) BP: (119-160)/(69-83) 160/83 mmHg (12/30 0428) SpO2:  [94 %-98 %] 98 % (12/30 0428) Good strength and sensation Incision CDI   Plan: OOB today - increase activity , prob ileus

## 2011-07-27 NOTE — Plan of Care (Signed)
Problem: Phase I Progression Outcomes Goal: Log roll for position change Outcome: Not Progressing Pt c/o pain

## 2011-07-28 NOTE — Progress Notes (Signed)
Patient ID: Paul Ray, male   DOB: [birth year], 55 y.o.   MRN: [id number] Subjective: Patient reports Incisional pain  Objective: Vital signs in last 24 hours: Temp:  [97.5 F (36.4 C)-98.2 F (36.8 C)] 97.5 F (36.4 C) (12/31 0434) Pulse Rate:  [74-95] 74  (12/31 0434) Resp:  [16-20] 20  (12/31 0434) BP: (110-148)/(67-94) 115/69 mmHg (12/31 0434) SpO2:  [93 %-98 %] 97 % (12/31 0434)  Intake/Output from previous day: 12/30 0701 - 12/31 0700 In: 597 [P.O.:597] Out: 1520 [Urine:1520] Intake/Output this shift:    No neuro deficit. Wound looks good  Lab Results: No results found for this basename: WBC:2,HGB:2,HCT:2,PLT:2 in the last 72 hours BMET No results found for this basename: NA:2,K:2,CL:2,CO2:2,GLUCOSE:2,BUN:2,CREATININE:2,CALCIUM:2 in the last 72 hours  Studies/Results: No results found.  Assessment/Plan: Doing ok. Slow to increase activity. Increase activity today...hopefully d/c tomorrow  LOS: 4 days  As above   Reinaldo Meeker, MD 07/28/2011, 9:08 AM

## 2011-07-28 NOTE — Progress Notes (Signed)
Utilization review completed. Levon Penning, RN, BSN. 07/28/11  

## 2011-07-29 MED ORDER — HYDROMORPHONE HCL 4 MG PO TABS: 4.0000 mg | ORAL_TABLET | ORAL | Status: AC | PRN

## 2011-07-29 NOTE — Discharge Summary (Signed)
Physician Discharge Summary  Patient ID: Paul Ray MRN: [id number] DOB/AGE: 1956-05-30 56 y.o.  Admit date: 07/24/2011 Discharge date: 07/29/2011  Admission Diagnoses:  Discharge Diagnoses:  Active Problems:  * No active hospital problems. *    Discharged Condition: good  Hospital Course: L3-5 laminectomy and discectomy  Consults: none  Significant Diagnostic Studies:None  Treatments: surgery: L3-5 laminectomy and discectomy   Discharge Exam: Blood pressure 117/70, pulse 64, temperature 97.8 F (36.6 C), temperature source Oral, resp. rate 20, height 5\' 8"  (1.727 m), weight 75.297 kg (166 lb), SpO2 93.00%. Neurologic: Grossly normal Incision/Wound:C/D/I  Disposition: Home or Self Care  Discharge Orders    Future Orders Please Complete By Expires   Ambulatory Referral for DME        Medication List  As of 07/29/2011  8:56 AM   START taking these medications         HYDROmorphone 4 MG tablet   Commonly known as: DILAUDID   Take 1 tablet (4 mg total) by mouth every 4 (four) hours as needed for pain.         CONTINUE taking these medications         b complex vitamins tablet      DEPAKOTE 500 MG DR tablet   Generic drug: divalproex      Krill Oil 300 MG Caps      lamoTRIgine 100 MG tablet   Commonly known as: LAMICTAL      lisdexamfetamine 60 MG capsule   Commonly known as: VYVANSE      multivitamin capsule      Tamsulosin HCl 0.4 MG Caps   Commonly known as: FLOMAX      traMADol 50 MG tablet   Commonly known as: ULTRAM         STOP taking these medications         Glucosamine Chondroitin Complx Caps      Oxycodone HCl 10 MG Tabs          Where to get your medications    These are the prescriptions that you need to pick up.   You may get these medications from any pharmacy.         HYDROmorphone 4 MG tablet             Signed: Datron Brakebill D 07/29/2011, 8:56 AM

## 2011-07-29 NOTE — Progress Notes (Signed)
Subjective: Patient reports doing better without any headache.  Objective: Vital signs in last 24 hours: Temp:  [97.9 F (36.6 C)-98.8 F (37.1 C)] 98.1 F (36.7 C) (01/01 0200) Pulse Rate:  [83-95] 86  (01/01 0200) Resp:  [20-24] 24  (01/01 0200) BP: (111-121)/(72-76) 120/76 mmHg (01/01 0200) SpO2:  [95 %-99 %] 96 % (01/01 0200)  Intake/Output from previous day:   Intake/Output this shift:    Physical Exam: Dressing C/D/I, flat.  Mobilizing slowly.  Lab Results: No results found for this basename: WBC:2,HGB:2,HCT:2,PLT:2 in the last 72 hours BMET No results found for this basename: NA:2,K:2,CL:2,CO2:2,GLUCOSE:2,BUN:2,CREATININE:2,CALCIUM:2 in the last 72 hours  Studies/Results: No results found.  Assessment/Plan: D/C home with po dilaudid, home equipment.  FU with Dr. Yetta Barre in office within 2 weeks.     LOS: 5 days    Dorian Heckle, MD 07/29/2011, 7:37 AM

## 2011-07-29 NOTE — Progress Notes (Signed)
Discharge instruction given and patient indicate complete understand. IV dc.  Needle intact.  Patient has no sign of distress.  Paul Ray

## 2011-07-29 NOTE — Progress Notes (Signed)
Rec'd call from attending nurse to provide a "reacher" for patient as he is being discharged. After discussion with patient, learned he needs one with a grabber on one end and a shoe horn on the other (or two tools). I spoke with Carollee Herter at [facility] who informed me that this type of item is a retail item and not supplied as a DME in the hospital. Patient will need a script and come to the store to pick up. I spoke with patient and gave him this information.

## 2011-09-16 ENCOUNTER — Other Ambulatory Visit: Payer: Self-pay | Admitting: Neurological Surgery

## 2011-09-16 ENCOUNTER — Ambulatory Visit (HOSPITAL_COMMUNITY)
Admission: RE | Admit: 2011-09-16 | Discharge: 2011-09-16 | Disposition: A | Discharge status: Home / Self Care | Payer: 59 | Source: Ambulatory Visit | Attending: Neurological Surgery | Admitting: Neurological Surgery

## 2011-09-16 DIAGNOSIS — M545 Low back pain, unspecified: Secondary | ICD-10-CM | POA: Insufficient documentation

## 2011-10-15 ENCOUNTER — Emergency Department (HOSPITAL_COMMUNITY): Payer: 59

## 2011-10-15 ENCOUNTER — Emergency Department (HOSPITAL_COMMUNITY)
Admission: EM | Admit: 2011-10-15 | Discharge: 2011-10-15 | Disposition: A | Discharge status: Home / Self Care | Payer: 59 | Attending: Emergency Medicine | Admitting: Emergency Medicine

## 2011-10-15 ENCOUNTER — Encounter (HOSPITAL_COMMUNITY): Payer: Self-pay | Admitting: Emergency Medicine

## 2011-10-15 DIAGNOSIS — R110 Nausea: Secondary | ICD-10-CM | POA: Insufficient documentation

## 2011-10-15 DIAGNOSIS — R109 Unspecified abdominal pain: Secondary | ICD-10-CM | POA: Insufficient documentation

## 2011-10-15 LAB — COMPREHENSIVE METABOLIC PANEL
ALT: 10 U/L (ref 0–53)
AST: 26 U/L (ref 0–37)
Albumin: 3.4 g/dL — ABNORMAL LOW (ref 3.5–5.2)
Alkaline Phosphatase: 75 U/L (ref 39–117)
BUN: 20 mg/dL (ref 6–23)
CO2: 30 mEq/L (ref 19–32)
Calcium: 9.5 mg/dL (ref 8.4–10.5)
Chloride: 100 mEq/L (ref 96–112)
Creatinine, Ser: 0.58 mg/dL (ref 0.50–1.35)
GFR calc Af Amer: >90 mL/min (ref >90)
GFR calc non Af Amer: >90 mL/min (ref >90)
Glucose, Bld: 96 mg/dL (ref 70–99)
Potassium: 4.1 mEq/L (ref 3.5–5.1)
Sodium: 140 mEq/L (ref 135–145)
Total Bilirubin: 0.4 mg/dL (ref 0.3–1.2)
Total Protein: 6.7 g/dL (ref 6.0–8.3)

## 2011-10-15 LAB — DIFFERENTIAL
Basophils Absolute: 0 10*3/uL (ref 0.0–0.1)
Basophils Relative: 0 % (ref 0–1)
Eosinophils Absolute: 0.1 10*3/uL (ref 0.0–0.7)
Eosinophils Relative: 1 % (ref 0–5)
Lymphocytes Relative: 15 % (ref 12–46)
Lymphs Abs: 1.6 10*3/uL (ref 0.7–4.0)
Monocytes Absolute: 1.2 10*3/uL — ABNORMAL HIGH (ref 0.1–1.0)
Monocytes Relative: 11 % (ref 3–12)
Neutro Abs: 7.8 10*3/uL — ABNORMAL HIGH (ref 1.7–7.7)
Neutrophils Relative %: 73 % (ref 43–77)

## 2011-10-15 LAB — LIPASE, BLOOD: Lipase: 13 U/L (ref 11–59)

## 2011-10-15 LAB — CBC
HCT: 43.9 % (ref 39.0–52.0)
Hemoglobin: 14.9 g/dL (ref 13.0–17.0)
MCH: 28.9 pg (ref 26.0–34.0)
MCHC: 33.9 g/dL (ref 30.0–36.0)
MCV: 85.2 fL (ref 78.0–100.0)
Platelets: 193 10*3/uL (ref 150–400)
RBC: 5.15 MIL/uL (ref 4.22–5.81)
RDW: 13.6 % (ref 11.5–15.5)
WBC: 10.7 10*3/uL — ABNORMAL HIGH (ref 4.0–10.5)

## 2011-10-15 MED ORDER — IOHEXOL 300 MG/ML  SOLN
100.0000 mL | Freq: Once | INTRAMUSCULAR | Status: AC | PRN
  Administered 2011-10-15: 100 mL via INTRAVENOUS

## 2011-10-15 MED ORDER — SODIUM CHLORIDE 0.9 % IV BOLUS (SEPSIS)
1000.0000 mL | Freq: Once | INTRAVENOUS | Status: AC
  Administered 2011-10-15: 1000 mL via INTRAVENOUS

## 2011-10-15 MED ORDER — SENNA-DOCUSATE SODIUM 8.6-50 MG PO TABS: 1.0000 | ORAL_TABLET | Freq: Every day | ORAL | Status: DC

## 2011-10-15 MED ORDER — HYDROMORPHONE HCL PF 1 MG/ML IJ SOLN
1.0000 mg | Freq: Once | INTRAMUSCULAR | Status: AC
  Administered 2011-10-15: 1 mg via INTRAVENOUS
  Filled 2011-10-15: qty 1

## 2011-10-15 MED ORDER — IOHEXOL 300 MG/ML  SOLN
20.0000 mL | INTRAMUSCULAR | Status: AC
  Administered 2011-10-15 (×2): 20 mL via ORAL

## 2011-10-15 MED ORDER — ONDANSETRON HCL 4 MG/2ML IJ SOLN
4.0000 mg | Freq: Once | INTRAMUSCULAR | Status: AC
  Administered 2011-10-15: 4 mg via INTRAVENOUS
  Filled 2011-10-15: qty 2

## 2011-10-15 NOTE — Discharge Instructions (Signed)

## 2011-10-15 NOTE — ED Provider Notes (Signed)
History     CSN: [id number]  Arrival date & time 10/15/11  4098   First MD Initiated Contact with Patient 10/15/11 [phone]      Chief Complaint  Patient presents with  . Abdominal Pain     HPI The patient p/w new abdominal pain, f/c, nausea.  He notes that over the past 2-3 weeks he has had decreasing frequency of BM w increasing pain.  His pain became constant ~4hr pta. Since that time he has had persistent LLQ pain; crampy/sharp/burning.  No sig relief w PO Dilaudid.  She denies any history of bowel obstruction.  No ascites taking significant amounts of Dilaudid for a recent back surgery. Past Medical History  Diagnosis Date  . Hepatitis C   . Hemorrhoids   . Abscess of lung     Right lower lobe  . Empyema lung   . ADD (attention deficit disorder)   . Bipolar depression   . Pneumonia     last year    Past Surgical History  Procedure Date  . Foot neuroma surgery 2006    Left  . Elbow surgery 2005    Right  . Lung surgery     for empyema  . Hip resurfacing     left hip at [facility] 01/2011  . Lumbar laminectomy/decompression microdiscectomy 07/24/2011    Procedure: LUMBAR LAMINECTOMY/DECOMPRESSION MICRODISCECTOMY;  Surgeon: Tia Alert;  Location: [facility];  Service: Neurosurgery;  Laterality: Bilateral;  Bilateral  , Lumbar Three-Four, Lumbar Four-Five Decompressive Laminectomy Rm # 32    Family History  Problem Relation Age of Onset  . COPD Mother   . Heart disease Mother   . Hypertension Mother   . Coronary artery disease Father   . Dementia Father   . Heart failure Father   . Prostate cancer Paternal Grandfather     also had bone cancer  . Breast cancer Maternal Grandmother   . Cancer Paternal Grandmother     unsure what kind    History  Substance Use Topics  . Smoking status: Never Smoker   . Smokeless tobacco: Never Used  . Alcohol Use: No     quit drinking in 1989      Review of Systems  Constitutional:       Per HPI, otherwise negative    HENT:       Per HPI, otherwise negative  Eyes: Negative.   Respiratory:       Per HPI, otherwise negative  Cardiovascular:       Per HPI, otherwise negative  Gastrointestinal: Negative for vomiting.  Genitourinary: Negative.   Musculoskeletal:       Per HPI, otherwise negative  Skin: Negative.   Neurological: Negative for syncope.    Allergies  Review of patient's allergies indicates no known allergies.  Home Medications   Current Outpatient Rx  Name Route Sig Dispense Refill  . B COMPLEX PO TABS Oral Take 1 tablet by mouth daily.      Marland Kitchen DIVALPROEX SODIUM 500 MG PO TBEC Oral Take 2,000 mg by mouth every evening.     Marland Kitchen HYDROMORPHONE HCL 4 MG PO TABS Oral Take 4 mg by mouth every 4 (four) hours as needed. For pain    . KRILL OIL 300 MG PO CAPS Oral Take 1 capsule by mouth daily.      Marland Kitchen LAMOTRIGINE 100 MG PO TABS Oral Take 100 mg by mouth daily.      Marland Kitchen LISDEXAMFETAMINE DIMESYLATE 60 MG PO CAPS Oral  Take 60 mg by mouth daily.      . MULTIVITAMINS PO CAPS Oral Take 1 capsule by mouth daily.      . SENNA-DOCUSATE SODIUM 8.6-50 MG PO TABS Oral Take 1 tablet by mouth daily.    Marland Kitchen TAMSULOSIN HCL 0.4 MG PO CAPS Oral Take 0.4 mg by mouth daily.        BP 113/78  Pulse 66  Temp(Src) 98 F (36.7 C) (Oral)  Resp 12  SpO2 100%  Physical Exam  Nursing note and vitals reviewed. Constitutional: He is oriented to person, place, and time. He appears well-developed. No distress.  HENT:  Head: Normocephalic and atraumatic.  Eyes: Conjunctivae and EOM are normal.  Cardiovascular: Normal rate and regular rhythm.   Pulmonary/Chest: Effort normal. No stridor. No respiratory distress.  Abdominal: He exhibits no distension. There is tenderness in the left lower quadrant. There is guarding. There is no rigidity and no rebound.  Musculoskeletal: He exhibits no edema.  Neurological: He is alert and oriented to person, place, and time.  Skin: Skin is warm and dry.  Psychiatric: He has a normal  mood and affect.   Rule out diverticulitis ED Course  Procedures (including critical care time)   Labs Reviewed  CBC  DIFFERENTIAL  COMPREHENSIVE METABOLIC PANEL  LIPASE, BLOOD   No results found.   No diagnosis found.    MDM  This 56yo M w narcoitc use for back pain now p/w abdominal pain.  On exam he is uncomfortable appearing w a tender abdomen.  Given the patient prescription for fevers, chills, abdominal pain and changes in his bowel movements there is concern for diverticulitis.  Patient's labs are notable for mild leukocytosis.  There is no evidence of acute pathology on the patient's CT.  Patient was discharged in stable condition with stool softeners and PMD followup        Gerhard Munch, MD 10/15/11 1206

## 2011-10-15 NOTE — ED Notes (Signed)
Pt given contrast to drink in prep for CT Scan.

## 2011-10-15 NOTE — ED Notes (Addendum)
Pt has finished drinking contrast- CT made aware. 

## 2011-10-15 NOTE — ED Notes (Signed)
Patient with abdominal pain since early this am.  Patient does have some nausea.  Patient denies any diarrhea.  Patient states he feels like he is constipated.  Patient has been taking 4mg  dilaudid with a stool softener and laxative, with no relief.

## 2012-02-06 ENCOUNTER — Ambulatory Visit (HOSPITAL_COMMUNITY)
Admission: RE | Admit: 2012-02-06 | Discharge: 2012-02-06 | Disposition: A | Discharge status: Home / Self Care | Payer: 59 | Source: Ambulatory Visit | Attending: Orthopaedic Surgery | Admitting: Orthopaedic Surgery

## 2012-02-06 ENCOUNTER — Other Ambulatory Visit (HOSPITAL_COMMUNITY): Payer: Self-pay | Admitting: Orthopaedic Surgery

## 2012-02-06 DIAGNOSIS — R52 Pain, unspecified: Secondary | ICD-10-CM

## 2012-02-06 DIAGNOSIS — M25519 Pain in unspecified shoulder: Secondary | ICD-10-CM | POA: Insufficient documentation

## 2012-02-06 DIAGNOSIS — G8929 Other chronic pain: Secondary | ICD-10-CM | POA: Insufficient documentation

## 2012-11-22 ENCOUNTER — Ambulatory Visit (HOSPITAL_COMMUNITY)
Admission: RE | Admit: 2012-11-22 | Discharge: 2012-11-22 | Disposition: A | Discharge status: Home / Self Care | Payer: 59 | Source: Ambulatory Visit | Attending: Orthopaedic Surgery | Admitting: Orthopaedic Surgery

## 2012-11-22 ENCOUNTER — Other Ambulatory Visit (HOSPITAL_COMMUNITY): Payer: Self-pay | Admitting: Orthopaedic Surgery

## 2012-11-22 DIAGNOSIS — R52 Pain, unspecified: Secondary | ICD-10-CM

## 2012-11-22 DIAGNOSIS — M19019 Primary osteoarthritis, unspecified shoulder: Secondary | ICD-10-CM | POA: Insufficient documentation

## 2012-11-22 DIAGNOSIS — M19079 Primary osteoarthritis, unspecified ankle and foot: Secondary | ICD-10-CM | POA: Insufficient documentation

## 2012-12-20 ENCOUNTER — Emergency Department (HOSPITAL_COMMUNITY)
Admission: EM | Admit: 2012-12-20 | Discharge: 2012-12-20 | Disposition: A | Discharge status: Home / Self Care | Payer: 59 | Source: Home / Self Care | Attending: Emergency Medicine | Admitting: Emergency Medicine

## 2012-12-20 ENCOUNTER — Encounter (HOSPITAL_COMMUNITY): Payer: Self-pay | Admitting: Emergency Medicine

## 2012-12-20 DIAGNOSIS — S90569A Insect bite (nonvenomous), unspecified ankle, initial encounter: Secondary | ICD-10-CM

## 2012-12-20 DIAGNOSIS — S80869A Insect bite (nonvenomous), unspecified lower leg, initial encounter: Secondary | ICD-10-CM

## 2012-12-20 DIAGNOSIS — W57XXXA Bitten or stung by nonvenomous insect and other nonvenomous arthropods, initial encounter: Secondary | ICD-10-CM

## 2012-12-20 DIAGNOSIS — T148 Other injury of unspecified body region: Secondary | ICD-10-CM

## 2012-12-20 MED ORDER — IBUPROFEN 800 MG PO TABS
ORAL_TABLET | ORAL | Status: AC
  Filled 2012-12-20: qty 1

## 2012-12-20 MED ORDER — TRIAMCINOLONE ACETONIDE 0.1 % EX CREA: TOPICAL_CREAM | Freq: Two times a day (BID) | CUTANEOUS | Status: AC

## 2012-12-20 MED ORDER — IBUPROFEN 600 MG PO TABS: 600.0000 mg | ORAL_TABLET | Freq: Once | ORAL | Status: DC

## 2012-12-20 MED ORDER — IBUPROFEN 800 MG PO TABS
800.0000 mg | ORAL_TABLET | Freq: Once | ORAL | Status: AC
  Administered 2012-12-20: 800 mg via ORAL

## 2012-12-20 MED ORDER — DOXYCYCLINE HYCLATE 100 MG PO CAPS: 100.0000 mg | ORAL_CAPSULE | Freq: Two times a day (BID) | ORAL | Status: AC

## 2012-12-20 NOTE — ED Notes (Signed)
Pt c/o tick bite on penis... Noticed it yest Sx include: pain... 8/10 Denies: f/v/n/d Also c/o multiple insect bites  He is alert and oriented w/no signs of acute distress.

## 2012-12-20 NOTE — ED Provider Notes (Signed)
History     CSN: [id number]  Arrival date & time 12/20/12  1051   First MD Initiated Contact with Patient 12/20/12 1110      Chief Complaint  Patient presents with  . Tick Removal    (Consider location/radiation/quality/duration/timing/severity/associated sxs/prior treatment) HPI Comments: Patient presents to urgent care with 2 complaints, he found a tick embedded in his penis, he tried to remove it unsuccessfully. His tender and itchy. He also complains of several other areas around his gluteal area and lateral and anterior tight he feels are mosquito bites or other insect, most of them are very itchy as he has been scratching them. Patient describes that he spends a lot of time outdoors. The current take is not engorged or increased in size it's just there. Patient denies any systemic symptoms such as fevers, headaches, generalized malaise, arthralgias or myalgias.  Patient is a 57 y.o. male presenting with animal bite. The history is provided by the patient.  Animal Bite Contact animal:  Insect Pain details:    Quality:  Aching   Timing:  Constant   Progression:  Worsening Incident location:  Outside Animal in possession: yes   Relieved by:  Nothing Worsened by:  Nothing tried Ineffective treatments:  None tried Associated symptoms: rash and swelling   Associated symptoms: no fever and no numbness     Past Medical History  Diagnosis Date  . Hepatitis C   . Hemorrhoids   . Abscess of lung     Right lower lobe  . Empyema lung   . ADD (attention deficit disorder)   . Bipolar depression   . Pneumonia     last year    Past Surgical History  Procedure Laterality Date  . Foot neuroma surgery  2006    Left  . Elbow surgery  2005    Right  . Lung surgery      for empyema  . Hip resurfacing      left hip at [facility] 01/2011  . Lumbar laminectomy/decompression microdiscectomy  07/24/2011    Procedure: LUMBAR LAMINECTOMY/DECOMPRESSION MICRODISCECTOMY;  Surgeon: Tia Alert;  Location: [facility];  Service: Neurosurgery;  Laterality: Bilateral;  Bilateral  , Lumbar Three-Four, Lumbar Four-Five Decompressive Laminectomy Rm # 32    Family History  Problem Relation Age of Onset  . COPD Mother   . Heart disease Mother   . Hypertension Mother   . Coronary artery disease Father   . Dementia Father   . Heart failure Father   . Prostate cancer Paternal Grandfather     also had bone cancer  . Breast cancer Maternal Grandmother   . Cancer Paternal Grandmother     unsure what kind    History  Substance Use Topics  . Smoking status: Never Smoker   . Smokeless tobacco: Never Used  . Alcohol Use: No     Comment: quit drinking in 1989      Review of Systems  Constitutional: Negative for fever, chills, activity change and appetite change.  HENT: Negative for neck pain and neck stiffness.   Gastrointestinal: Negative for nausea, vomiting and abdominal pain.  Genitourinary: Positive for penile swelling.  Skin: Positive for color change and rash. Negative for pallor.  Neurological: Negative for weakness, numbness and headaches.    Allergies  Review of patient's allergies indicates no known allergies.  Home Medications   Current Outpatient Rx  Name  Route  Sig  Dispense  Refill  . b complex vitamins tablet  Oral   Take 1 tablet by mouth daily.           . divalproex (DEPAKOTE) 500 MG EC tablet   Oral   Take 2,000 mg by mouth every evening.          Marland Kitchen doxycycline (VIBRAMYCIN) 100 MG capsule   Oral   Take 1 capsule (100 mg total) by mouth 2 (two) times daily.   14 capsule   0   . HYDROmorphone (DILAUDID) 4 MG tablet   Oral   Take 4 mg by mouth every 4 (four) hours as needed. For pain         . Krill Oil 300 MG CAPS   Oral   Take 1 capsule by mouth daily.           Marland Kitchen lamoTRIgine (LAMICTAL) 100 MG tablet   Oral   Take 100 mg by mouth daily.           Marland Kitchen lisdexamfetamine (VYVANSE) 60 MG capsule   Oral   Take 60 mg by  mouth daily.           . Multiple Vitamin (MULTIVITAMIN) capsule   Oral   Take 1 capsule by mouth daily.           . sennosides-docusate sodium (SENOKOT-S) 8.6-50 MG tablet   Oral   Take 1 tablet by mouth daily.   30 tablet   0   . Tamsulosin HCl (FLOMAX) 0.4 MG CAPS   Oral   Take 0.4 mg by mouth daily.           Marland Kitchen triamcinolone cream (KENALOG) 0.1 %   Topical   Apply topically 2 (two) times daily.   30 g   0     BP 136/85  Pulse 67  Temp(Src) 98.3 F (36.8 C) (Oral)  Resp 20  SpO2 100%  Physical Exam  Constitutional: Vital signs are normal. He appears well-developed and well-nourished.  Non-toxic appearance. He does not have a sickly appearance. He does not appear ill. No distress.  Genitourinary:     Skin: No abrasion, no ecchymosis and no rash noted. There is erythema. No pallor.       ED Course  Procedures (including critical care time)  Labs Reviewed - No data to display No results found.   1. Tick bite of lower leg, unspecified laterality, initial encounter   2. Insect bites       MDM  Problem #1 embedded tick in external genitalia- he removed most of it with the suspicion of minimal fragment-discussed with patient that most likely his body will reject his or create fibrotic tissue around it. No signs of secondary infection at this point he agrees with treatment plan and followup care as necessary. Of note the patient rejected for me to use local anesthetic as this area is embedded in the subcutaneous tissue, not indicated to be removed. Patient encouraged to use a topical antibiotic and area of swelling tenderness or redness to start with oral antibiotics. We also discussed the low risk of a tick borne illness.  Problem #2 insect bites/mosquito bites pruritic patient was prescribed triamcinolone cream to use twice a day. Encourage patient to increase his hygiene to prevent this lesions from becoming secondarily infected. He understands and agrees  with treatment plan and followup care.        Jimmie Molly, MD 12/20/12 [phone]

## 2012-12-21 NOTE — ED Notes (Signed)
Pt called stating that he lost his printed prescription for doxycycline. rx called to [facility] per pt request. Mw,cma

## 2013-05-30 ENCOUNTER — Telehealth: Payer: Self-pay | Admitting: *Deleted

## 2013-05-30 NOTE — Telephone Encounter (Signed)
Pt states Jublia is too expensive to put on both feet, if only has problem toenail on the right, how to keep feet dry when working in the rain, and should he wear flip-flops.  Dr Ralene Cork states okay to treat only right foot, do the best you can to stay dry and work, best to makes sure get clean and dry after work and to only wear flip-flops when resting and sitting to allow feet to dry out, but not for normal walking they have no support for the structures of the feet.

## 2013-06-27 ENCOUNTER — Other Ambulatory Visit (HOSPITAL_COMMUNITY): Payer: Self-pay | Admitting: Orthopaedic Surgery

## 2013-06-30 ENCOUNTER — Encounter (HOSPITAL_COMMUNITY): Payer: Self-pay | Admitting: Pharmacy Technician

## 2013-07-01 ENCOUNTER — Ambulatory Visit (HOSPITAL_COMMUNITY)
Admission: RE | Admit: 2013-07-01 | Discharge: 2013-07-01 | Disposition: A | Discharge status: Home / Self Care | Payer: 59 | Source: Ambulatory Visit | Attending: Anesthesiology | Admitting: Anesthesiology

## 2013-07-01 ENCOUNTER — Encounter (HOSPITAL_COMMUNITY)
Admission: RE | Admit: 2013-07-01 | Discharge: 2013-07-01 | Disposition: A | Discharge status: Home / Self Care | Payer: 59 | Source: Ambulatory Visit | Attending: Orthopaedic Surgery | Admitting: Orthopaedic Surgery

## 2013-07-01 ENCOUNTER — Encounter (HOSPITAL_COMMUNITY): Payer: Self-pay

## 2013-07-01 DIAGNOSIS — Z01818 Encounter for other preprocedural examination: Secondary | ICD-10-CM | POA: Insufficient documentation

## 2013-07-01 DIAGNOSIS — Z01812 Encounter for preprocedural laboratory examination: Secondary | ICD-10-CM | POA: Insufficient documentation

## 2013-07-01 HISTORY — DX: Abscess of prostate: N41.2

## 2013-07-01 HISTORY — DX: Disorder of prostate, unspecified: N42.9

## 2013-07-01 HISTORY — DX: Unspecified osteoarthritis, unspecified site: M19.90

## 2013-07-01 LAB — COMPREHENSIVE METABOLIC PANEL
ALT: 10 U/L (ref 0–53)
AST: 28 U/L (ref 0–37)
Albumin: 3.6 g/dL (ref 3.5–5.2)
Alkaline Phosphatase: 57 U/L (ref 39–117)
BUN: 23 mg/dL (ref 6–23)
CO2: 32 mEq/L (ref 19–32)
Calcium: 9.3 mg/dL (ref 8.4–10.5)
Chloride: 99 mEq/L (ref 96–112)
Creatinine, Ser: 0.69 mg/dL (ref 0.50–1.35)
GFR calc Af Amer: >90 mL/min (ref >90)
GFR calc non Af Amer: >90 mL/min (ref >90)
Glucose, Bld: 87 mg/dL (ref 70–99)
Potassium: 4.7 mEq/L (ref 3.5–5.1)
Sodium: 138 mEq/L (ref 135–145)
Total Bilirubin: 0.2 mg/dL — ABNORMAL LOW (ref 0.3–1.2)
Total Protein: 7 g/dL (ref 6.0–8.3)

## 2013-07-01 LAB — TYPE AND SCREEN
ABO/RH(D): O POS
Antibody Screen: NEGATIVE

## 2013-07-01 LAB — CBC
HCT: 42.6 % (ref 39.0–52.0)
Hemoglobin: 14.5 g/dL (ref 13.0–17.0)
MCH: 29.5 pg (ref 26.0–34.0)
MCHC: 34 g/dL (ref 30.0–36.0)
MCV: 86.6 fL (ref 78.0–100.0)
Platelets: 158 10*3/uL (ref 150–400)
RBC: 4.92 MIL/uL (ref 4.22–5.81)
RDW: 13.4 % (ref 11.5–15.5)
WBC: 6.6 10*3/uL (ref 4.0–10.5)

## 2013-07-01 LAB — APTT: aPTT: 30 seconds (ref 24–37)

## 2013-07-01 LAB — PROTIME-INR
INR: 0.99 (ref 0.00–1.49)
Prothrombin Time: 12.9 seconds (ref 11.6–15.2)

## 2013-07-01 NOTE — Pre-Procedure Instructions (Signed)
Paul Ray  07/01/2013   Your procedure is scheduled on:  Dec. 9  Report to [facility]  2 * 3 at 1230  Call this number if you have problems the morning of surgery: [phone]   Remember:   Do not eat food or drink liquids after midnight.   Take these medicines the morning of surgery with A SIP OF WATER: Adderall, Prozac, Flonase if needed, Lamictal,  and Flomax  Stop taking Aspirin, Aleve, Ibuprofen, BC's, Goody's, Herbal medications, Fish Oil, Celebrex   Do not wear jewelry, make-up or nail polish.  Do not wear lotions, powders, or perfumes. You may wear deodorant.  Do not shave 48 hours prior to surgery. Men may shave face and neck.  Do not bring valuables to the hospital.  [facility] is not responsible                  for any belongings or valuables.               Contacts, dentures or bridgework may not be worn into surgery.  Leave suitcase in the car. After surgery it may be brought to your room.  For patients admitted to the hospital, discharge time is determined by your                treatment team.               Patients discharged the day of surgery will not be allowed to drive  home.    Special Instructions: Shower using CHG 2 nights before surgery and the night before surgery.  If you shower the day of surgery use CHG.  Use special wash - you have one bottle of CHG for all showers.  You should use approximately 1/3 of the bottle for each shower.   Please read over the following fact sheets that you were given: Pain Booklet, Coughing and Deep Breathing, Blood Transfusion Information, MRSA Information and Surgical Site Infection Prevention

## 2013-07-01 NOTE — Progress Notes (Signed)
Pt not here for pre-admit interview, called cell phone number and home number-messages left on both.

## 2013-07-01 NOTE — Progress Notes (Signed)
Pt here and seen at 1030 in pre-admit.  PCP is Dr Tiburcio Pea Denies having a cardiologist. Denies having a stress test, echo, or card cath.  Pt states that he would like Dr Magnus Ivan to inject his left hand while he is in surgery, encouraged pt to speak with Dr Magnus Ivan about this. Voices understanding.  Voices understanding of  Pre-admit instructions, with teach back noted.

## 2013-07-04 MED ORDER — CEFAZOLIN SODIUM-DEXTROSE 2-3 GM-% IV SOLR
2.0000 g | INTRAVENOUS | Status: AC
  Administered 2013-07-05: 2 g via INTRAVENOUS
  Filled 2013-07-04: qty 50

## 2013-07-05 ENCOUNTER — Encounter (HOSPITAL_COMMUNITY)
Admission: RE | Disposition: A | Discharge status: Home / Self Care | Payer: Self-pay | Source: Ambulatory Visit | Attending: Orthopaedic Surgery

## 2013-07-05 ENCOUNTER — Encounter (HOSPITAL_COMMUNITY): Payer: 59 | Admitting: Certified Registered Nurse Anesthetist

## 2013-07-05 ENCOUNTER — Encounter (HOSPITAL_COMMUNITY): Payer: Self-pay | Admitting: Anesthesiology

## 2013-07-05 ENCOUNTER — Ambulatory Visit (HOSPITAL_COMMUNITY)
Admission: RE | Admit: 2013-07-05 | Discharge: 2013-07-05 | Disposition: A | Discharge status: Home / Self Care | Payer: 59 | Source: Ambulatory Visit | Attending: Orthopaedic Surgery | Admitting: Orthopaedic Surgery

## 2013-07-05 ENCOUNTER — Ambulatory Visit (HOSPITAL_COMMUNITY): Payer: 59 | Admitting: Certified Registered Nurse Anesthetist

## 2013-07-05 DIAGNOSIS — F988 Other specified behavioral and emotional disorders with onset usually occurring in childhood and adolescence: Secondary | ICD-10-CM | POA: Insufficient documentation

## 2013-07-05 DIAGNOSIS — M67919 Unspecified disorder of synovium and tendon, unspecified shoulder: Secondary | ICD-10-CM | POA: Insufficient documentation

## 2013-07-05 DIAGNOSIS — M19019 Primary osteoarthritis, unspecified shoulder: Secondary | ICD-10-CM | POA: Insufficient documentation

## 2013-07-05 DIAGNOSIS — F319 Bipolar disorder, unspecified: Secondary | ICD-10-CM | POA: Insufficient documentation

## 2013-07-05 DIAGNOSIS — M719 Bursopathy, unspecified: Secondary | ICD-10-CM | POA: Insufficient documentation

## 2013-07-05 DIAGNOSIS — K649 Unspecified hemorrhoids: Secondary | ICD-10-CM | POA: Insufficient documentation

## 2013-07-05 DIAGNOSIS — M25819 Other specified joint disorders, unspecified shoulder: Secondary | ICD-10-CM | POA: Insufficient documentation

## 2013-07-05 DIAGNOSIS — B1920 Unspecified viral hepatitis C without hepatic coma: Secondary | ICD-10-CM | POA: Insufficient documentation

## 2013-07-05 DIAGNOSIS — M19049 Primary osteoarthritis, unspecified hand: Secondary | ICD-10-CM | POA: Insufficient documentation

## 2013-07-05 DIAGNOSIS — M7541 Impingement syndrome of right shoulder: Secondary | ICD-10-CM

## 2013-07-05 DIAGNOSIS — M24119 Other articular cartilage disorders, unspecified shoulder: Secondary | ICD-10-CM | POA: Insufficient documentation

## 2013-07-05 HISTORY — PX: SHOULDER ARTHROSCOPY: SHX128

## 2013-07-05 SURGERY — ARTHROSCOPY, SHOULDER
Anesthesia: General | Site: Shoulder | Laterality: Right

## 2013-07-05 MED ORDER — OXYCODONE HCL 5 MG PO TABS
5.0000 mg | ORAL_TABLET | Freq: Once | ORAL | Status: AC | PRN
  Administered 2013-07-05: 5 mg via ORAL

## 2013-07-05 MED ORDER — PHENYLEPHRINE HCL 10 MG/ML IJ SOLN
10.0000 mg | INTRAVENOUS | Status: DC | PRN
  Administered 2013-07-05: 25 ug/min via INTRAVENOUS

## 2013-07-05 MED ORDER — MIDAZOLAM HCL 2 MG/2ML IJ SOLN
INTRAMUSCULAR | Status: AC
  Administered 2013-07-05: 2 mg
  Administered 2013-07-05: 14:00:00
  Filled 2013-07-05: qty 2

## 2013-07-05 MED ORDER — MIDAZOLAM HCL 5 MG/5ML IJ SOLN
INTRAMUSCULAR | Status: DC | PRN
  Administered 2013-07-05: 2 mg via INTRAVENOUS

## 2013-07-05 MED ORDER — FENTANYL CITRATE 0.05 MG/ML IJ SOLN
INTRAMUSCULAR | Status: AC
  Filled 2013-07-05: qty 2

## 2013-07-05 MED ORDER — LACTATED RINGERS IV SOLN: INTRAVENOUS | Status: DC

## 2013-07-05 MED ORDER — CYCLOBENZAPRINE HCL 10 MG PO TABS: 10.0000 mg | ORAL_TABLET | Freq: Three times a day (TID) | ORAL | Status: DC | PRN

## 2013-07-05 MED ORDER — BUPIVACAINE HCL (PF) 0.25 % IJ SOLN
INTRAMUSCULAR | Status: AC
  Filled 2013-07-05: qty 30

## 2013-07-05 MED ORDER — OXYCODONE HCL 5 MG PO TABS
ORAL_TABLET | ORAL | Status: AC
  Administered 2013-07-05: 5 mg
  Filled 2013-07-05: qty 2

## 2013-07-05 MED ORDER — ROCURONIUM BROMIDE 100 MG/10ML IV SOLN
INTRAVENOUS | Status: DC | PRN
  Administered 2013-07-05: 50 mg via INTRAVENOUS

## 2013-07-05 MED ORDER — OXYCODONE HCL 5 MG PO TABS: 5.0000 mg | ORAL_TABLET | ORAL | Status: DC | PRN

## 2013-07-05 MED ORDER — ONDANSETRON HCL 4 MG/2ML IJ SOLN: 4.0000 mg | Freq: Once | INTRAMUSCULAR | Status: DC | PRN

## 2013-07-05 MED ORDER — OXYCODONE-ACETAMINOPHEN 5-325 MG PO TABS: 1.0000 | ORAL_TABLET | ORAL | Status: DC

## 2013-07-05 MED ORDER — BUPIVACAINE HCL (PF) 0.25 % IJ SOLN
INTRAMUSCULAR | Status: DC | PRN
  Administered 2013-07-05: 1 mL via INTRA_ARTICULAR

## 2013-07-05 MED ORDER — ARTIFICIAL TEARS OP OINT
TOPICAL_OINTMENT | OPHTHALMIC | Status: DC | PRN
  Administered 2013-07-05: 1 via OPHTHALMIC

## 2013-07-05 MED ORDER — OXYCODONE HCL 5 MG/5ML PO SOLN: 5.0000 mg | Freq: Once | ORAL | Status: AC | PRN

## 2013-07-05 MED ORDER — LIDOCAINE HCL (CARDIAC) 20 MG/ML IV SOLN
INTRAVENOUS | Status: DC | PRN
  Administered 2013-07-05: 60 mg via INTRAVENOUS

## 2013-07-05 MED ORDER — KETOROLAC TROMETHAMINE 30 MG/ML IJ SOLN
INTRAMUSCULAR | Status: AC
  Filled 2013-07-05: qty 1

## 2013-07-05 MED ORDER — FENTANYL CITRATE 0.05 MG/ML IJ SOLN
INTRAMUSCULAR | Status: AC
  Administered 2013-07-05: 100 ug via INTRAVENOUS
  Filled 2013-07-05: qty 2

## 2013-07-05 MED ORDER — METHYLPREDNISOLONE ACETATE 40 MG/ML IJ SUSP
INTRAMUSCULAR | Status: AC
  Filled 2013-07-05: qty 1

## 2013-07-05 MED ORDER — FENTANYL CITRATE 0.05 MG/ML IJ SOLN
50.0000 ug | INTRAMUSCULAR | Status: DC | PRN
  Administered 2013-07-05: 100 ug via INTRAVENOUS
  Administered 2013-07-05: 50 ug via INTRAVENOUS

## 2013-07-05 MED ORDER — CYCLOBENZAPRINE HCL 10 MG PO TABS: 10.0000 mg | ORAL_TABLET | Freq: Once | ORAL | Status: DC

## 2013-07-05 MED ORDER — PROPOFOL 10 MG/ML IV BOLUS
INTRAVENOUS | Status: DC | PRN
  Administered 2013-07-05: 200 mg via INTRAVENOUS

## 2013-07-05 MED ORDER — KETOROLAC TROMETHAMINE 30 MG/ML IJ SOLN
15.0000 mg | Freq: Once | INTRAMUSCULAR | Status: AC | PRN
  Administered 2013-07-05: 30 mg via INTRAVENOUS

## 2013-07-05 MED ORDER — ONDANSETRON HCL 4 MG/2ML IJ SOLN
INTRAMUSCULAR | Status: DC | PRN
  Administered 2013-07-05: 4 mg via INTRAVENOUS

## 2013-07-05 MED ORDER — HYDROMORPHONE HCL PF 1 MG/ML IJ SOLN
INTRAMUSCULAR | Status: AC
  Filled 2013-07-05: qty 1

## 2013-07-05 MED ORDER — METHYLPREDNISOLONE ACETATE 40 MG/ML IJ SUSP
INTRAMUSCULAR | Status: DC | PRN
  Administered 2013-07-05: 40 mg via INTRA_ARTICULAR

## 2013-07-05 MED ORDER — GLYCOPYRROLATE 0.2 MG/ML IJ SOLN
INTRAMUSCULAR | Status: DC | PRN
  Administered 2013-07-05: 0.4 mg via INTRAVENOUS

## 2013-07-05 MED ORDER — LACTATED RINGERS IV SOLN
INTRAVENOUS | Status: DC
  Administered 2013-07-05: 50 mL/h via INTRAVENOUS

## 2013-07-05 MED ORDER — LACTATED RINGERS IV SOLN
INTRAVENOUS | Status: DC | PRN
  Administered 2013-07-05 (×2): via INTRAVENOUS

## 2013-07-05 MED ORDER — MIDAZOLAM HCL 2 MG/2ML IJ SOLN: 1.0000 mg | INTRAMUSCULAR | Status: DC | PRN

## 2013-07-05 MED ORDER — FENTANYL CITRATE 0.05 MG/ML IJ SOLN
INTRAMUSCULAR | Status: DC | PRN
  Administered 2013-07-05: 150 ug via INTRAVENOUS
  Administered 2013-07-05 (×2): 50 ug via INTRAVENOUS

## 2013-07-05 MED ORDER — SODIUM CHLORIDE 0.9 % IR SOLN
Status: DC | PRN
  Administered 2013-07-05: 3000 mL

## 2013-07-05 MED ORDER — NEOSTIGMINE METHYLSULFATE 1 MG/ML IJ SOLN
INTRAMUSCULAR | Status: DC | PRN
  Administered 2013-07-05: 3 mg via INTRAVENOUS

## 2013-07-05 MED ORDER — HYDROMORPHONE HCL PF 1 MG/ML IJ SOLN
0.2500 mg | INTRAMUSCULAR | Status: DC | PRN
  Administered 2013-07-05: 0.5 mg via INTRAVENOUS

## 2013-07-05 SURGICAL SUPPLY — 55 items
BLADE CUDA 4.2 (BLADE) ×2 IMPLANT
BLADE SURG 11 STRL SS (BLADE) ×2 IMPLANT
BLADE SURG ROTATE 9660 (MISCELLANEOUS) IMPLANT
BUR VERTEX HOODED 4.5 (BURR) IMPLANT
CANNULA 5.75X7 CRYSTAL CLEAR (CANNULA) IMPLANT
CANNULA SHOULDER 7CM (CANNULA) ×4 IMPLANT
CANNULA TWIST IN 8.25X7CM (CANNULA) IMPLANT
CLOTH BEACON ORANGE TIMEOUT ST (SAFETY) ×2 IMPLANT
COVER SURGICAL LIGHT HANDLE (MISCELLANEOUS) ×2 IMPLANT
DECANTER SPIKE VIAL GLASS SM (MISCELLANEOUS) IMPLANT
DRAPE INCISE IOBAN 66X45 STRL (DRAPES) IMPLANT
DRAPE SHOULDER BEACH CHAIR (DRAPES) ×2 IMPLANT
DRAPE SURG 17X23 STRL (DRAPES) ×2 IMPLANT
DRAPE U-SHAPE 47X51 STRL (DRAPES) ×2 IMPLANT
DRSG PAD ABDOMINAL 8X10 ST (GAUZE/BANDAGES/DRESSINGS) ×3 IMPLANT
DURAPREP 26ML APPLICATOR (WOUND CARE) ×2 IMPLANT
ELECT REM PT RETURN 9FT ADLT (ELECTROSURGICAL)
ELECTRODE REM PT RTRN 9FT ADLT (ELECTROSURGICAL) IMPLANT
GAUZE XEROFORM 1X8 LF (GAUZE/BANDAGES/DRESSINGS) ×2 IMPLANT
GLOVE BIO SURGEON STRL SZ8 (GLOVE) ×2 IMPLANT
GLOVE BIOGEL PI IND STRL 8 (GLOVE) ×1 IMPLANT
GLOVE BIOGEL PI INDICATOR 8 (GLOVE) ×1
GLOVE ORTHO TXT STRL SZ7.5 (GLOVE) ×2 IMPLANT
GOWN PREVENTION PLUS LG XLONG (DISPOSABLE) IMPLANT
GOWN PREVENTION PLUS XLARGE (GOWN DISPOSABLE) ×4 IMPLANT
GOWN STRL NON-REIN LRG LVL3 (GOWN DISPOSABLE) ×4 IMPLANT
KIT BASIN OR (CUSTOM PROCEDURE TRAY) ×2 IMPLANT
KIT ROOM TURNOVER OR (KITS) ×2 IMPLANT
KIT SHOULDER TRACTION (DRAPES) ×2 IMPLANT
MANIFOLD NEPTUNE II (INSTRUMENTS) ×2 IMPLANT
NDL 1/2 CIR CATGUT .05X1.09 (NEEDLE) IMPLANT
NDL HYPO 25GX1X1/2 BEV (NEEDLE) IMPLANT
NDL SCORPION (NEEDLE) IMPLANT
NDL SPNL 18GX3.5 QUINCKE PK (NEEDLE) ×1 IMPLANT
NEEDLE 1/2 CIR CATGUT .05X1.09 (NEEDLE) IMPLANT
NEEDLE HYPO 25GX1X1/2 BEV (NEEDLE) IMPLANT
NEEDLE SCORPION (NEEDLE) IMPLANT
NEEDLE SPNL 18GX3.5 QUINCKE PK (NEEDLE) ×2 IMPLANT
NS IRRIG 1000ML POUR BTL (IV SOLUTION) ×2 IMPLANT
PACK SHOULDER (CUSTOM PROCEDURE TRAY) ×2 IMPLANT
PAD ARMBOARD 7.5X6 YLW CONV (MISCELLANEOUS) ×4 IMPLANT
SET ARTHROSCOPY TUBING (MISCELLANEOUS) ×2
SET ARTHROSCOPY TUBING LN (MISCELLANEOUS) ×1 IMPLANT
SLING SWATHE LARGE (SOFTGOODS) ×1 IMPLANT
SPONGE GAUZE 4X4 12PLY (GAUZE/BANDAGES/DRESSINGS) ×2 IMPLANT
SPONGE LAP 4X18 X RAY DECT (DISPOSABLE) ×2 IMPLANT
STAPLER VISISTAT 35W (STAPLE) IMPLANT
STRIP CLOSURE SKIN 1/2X4 (GAUZE/BANDAGES/DRESSINGS) IMPLANT
SUT ETHILON 3 0 PS 1 (SUTURE) IMPLANT
SYR CONTROL 10ML LL (SYRINGE) IMPLANT
TAPE CLOTH SURG 6X10 WHT LF (GAUZE/BANDAGES/DRESSINGS) ×1 IMPLANT
TOWEL OR 17X24 6PK STRL BLUE (TOWEL DISPOSABLE) ×2 IMPLANT
TOWEL OR 17X26 10 PK STRL BLUE (TOWEL DISPOSABLE) ×2 IMPLANT
WAND 90 DEG TURBOVAC W/CORD (SURGICAL WAND) ×2 IMPLANT
WATER STERILE IRR 1000ML POUR (IV SOLUTION) ×2 IMPLANT

## 2013-07-05 NOTE — Anesthesia Preprocedure Evaluation (Addendum)
Anesthesia Evaluation  Patient identified by MRN, date of birth, ID band Patient awake    Reviewed: Allergy & Precautions, H&P , NPO status , Patient's Chart, lab work & pertinent test results  Airway Mallampati: II TM Distance: <3 FB Neck ROM: Full  Mouth opening: Limited Mouth Opening  Dental  (+) Teeth Intact and Dental Advisory Given   Pulmonary pneumonia -, resolved,  06-30-13 Chest x-ray IMPRESSION: There is no evidence of pneumonia nor CHF nor other acute cardiopulmonary abnormality. Mild stable chronic postsurgical changes are noted in the right lower hemi thorax.   breath sounds clear to auscultation  Pulmonary exam normal       Cardiovascular Exercise Tolerance: Good negative cardio ROS  Rhythm:Regular Rate:Normal     Neuro/Psych PSYCHIATRIC DISORDERS Anxiety Depression Bipolar Disorder    GI/Hepatic negative GI ROS, (+) Hepatitis -, C  Endo/Other  negative endocrine ROS  Renal/GU negative Renal ROS     Musculoskeletal  (+) Arthritis -, Osteoarthritis,    Abdominal Normal abdominal exam  (+)   Peds  Hematology negative hematology ROS (+)   Anesthesia Other Findings   Reproductive/Obstetrics                        Anesthesia Physical Anesthesia Plan  ASA: II  Anesthesia Plan: General   Post-op Pain Management:    Induction: Intravenous  Airway Management Planned: Oral ETT  Additional Equipment:   Intra-op Plan:   Post-operative Plan:   Informed Consent: I have reviewed the patients History and Physical, chart, labs and discussed the procedure including the risks, benefits and alternatives for the proposed anesthesia with the patient or authorized representative who has indicated his/her understanding and acceptance.   Dental advisory given  Plan Discussed with: CRNA and Anesthesiologist  Anesthesia Plan Comments:         Anesthesia Quick Evaluation

## 2013-07-05 NOTE — Brief Op Note (Signed)
07/05/2013  4:03 PM  PATIENT:  Paul Ray  57 y.o. male  PRE-OPERATIVE DIAGNOSIS:  Right shoulder glenohumeral arthritis  POST-OPERATIVE DIAGNOSIS:  Right shoulder glenohumeral arthritis  PROCEDURE:  Procedure(s): RIGHT SHOULDER ARTHROSCOPY WITH DEBRIDEMENT (Right)  SURGEON:  Surgeon(s) and Role:    * Kathryne Hitch, MD - Primary  PHYSICIAN ASSISTANT: Rexene Edison, PA-C  ANESTHESIA:   local, regional and general  EBL:     BLOOD ADMINISTERED:none  DRAINS: none   LOCAL MEDICATIONS USED:  marcaine  SPECIMEN:  No Specimen  DISPOSITION OF SPECIMEN:  N/A  COUNTS:  YES  TOURNIQUET:  * No tourniquets in log *  DICTATION: .Other Dictation: Dictation Number [id number]  PLAN OF CARE: discharge to home from PACU  PATIENT DISPOSITION:  PACU - hemodynamically stable.   Delay start of Pharmacological VTE agent (>24hrs) due to surgical blood loss or risk of bleeding: no

## 2013-07-05 NOTE — Anesthesia Postprocedure Evaluation (Signed)
  Anesthesia Post-op Note  Patient: Paul Ray  Procedure(s) Performed: Procedure(s): RIGHT SHOULDER ARTHROSCOPY WITH DEBRIDEMENT (Right)  Patient Location: PACU  Anesthesia Type:General and GA combined with regional for post-op pain  Level of Consciousness: awake, alert  and oriented  Airway and Oxygen Therapy: Patient Spontanous Breathing  Post-op Pain: none  Post-op Assessment: Post-op Vital signs reviewed, Patient's Cardiovascular Status Stable, Respiratory Function Stable, Patent Airway and Pain level controlled  Post-op Vital Signs: stable  Complications: No apparent anesthesia complications

## 2013-07-05 NOTE — Preoperative (Signed)
Beta Blockers   Not applicable 

## 2013-07-05 NOTE — Transfer of Care (Signed)
Immediate Anesthesia Transfer of Care Note  Patient: Paul Ray  Procedure(s) Performed: Procedure(s): RIGHT SHOULDER ARTHROSCOPY WITH DEBRIDEMENT (Right)  Patient Location: PACU  Anesthesia Type:General  Level of Consciousness: sedated and patient cooperative  Airway & Oxygen Therapy: Patient Spontanous Breathing and Patient connected to nasal cannula oxygen  Post-op Assessment: Report given to PACU RN and Post -op Vital signs reviewed and stable  Post vital signs: Reviewed and stable  Complications: No apparent anesthesia complications

## 2013-07-05 NOTE — Anesthesia Procedure Notes (Addendum)
Procedure Name: Intubation Date/Time: 07/05/2013 2:54 PM Performed by: Tyrone Nine Pre-anesthesia Checklist: Patient identified, Timeout performed, Emergency Drugs available, Suction available and Patient being monitored Patient Re-evaluated:Patient Re-evaluated prior to inductionOxygen Delivery Method: Circle system utilized Preoxygenation: Pre-oxygenation with 100% oxygen Intubation Type: IV induction Ventilation: Mask ventilation without difficulty and Oral airway inserted - appropriate to patient size Laryngoscope Size: Hyacinth Meeker and 2 Grade View: Grade I Tube type: Oral Tube size: 8.0 mm Number of attempts: 1 Airway Equipment and Method: Stylet Placement Confirmation: positive ETCO2,  ETT inserted through vocal cords under direct vision and breath sounds checked- equal and bilateral Secured at: 23 cm Tube secured with: Tape Dental Injury: Teeth and Oropharynx as per pre-operative assessment    Anesthesia Regional Block:  Interscalene brachial plexus block  Pre-Anesthetic Checklist: ,, timeout performed, Correct Patient, Correct Site, Correct Laterality, Correct Procedure, Correct Position, site marked, Risks and benefits discussed,  Surgical consent,  Pre-op evaluation,  At surgeon's request and post-op pain management  Laterality: Right  Prep: chloraprep       Needles:   Needle Type: Echogenic Stimulator Needle     Needle Length:cm 9 cm Needle Gauge: 22 and 22 G    Additional Needles:  Procedures: nerve stimulator Interscalene brachial plexus block Narrative:  Start time: 07/05/2013 1:50 PM End time: 07/05/2013 1:55 PM Injection made incrementally with aspirations every 5 mL.  Performed by: Personally   Additional Notes: 20 cc 0.5% marcaine with 1:200 Epi injected easily.

## 2013-07-05 NOTE — H&P (Signed)
Paul Ray is an 57 y.o. male.   Chief Complaint:   Right shoulder pan and weakness HPI:   57 yo male well-known to me with chronic right shoulder issues.  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  He has has persistent right shoulder pain for sometime now and has failed numerous injections, pain meds, rest, NSAIDs, and therapy.  Given the failure of conservative treatment, at this point he wishes to proceed with a shoulder arthroscopy.  Past Medical History  Diagnosis Date  . Hepatitis C   . Hemorrhoids   . Abscess of lung(513.0)     Right lower lobe  . Empyema lung   . ADD (attention deficit disorder)   . Bipolar depression   . Pneumonia     last year  . Prostate abscess   . Prostate troubles   . Arthritis     Past Surgical History  Procedure Laterality Date  . Foot neuroma surgery  2006    Left  . Elbow surgery  2005    Right  . Lung surgery      for empyema  . Hip resurfacing      left hip at [facility] 01/2011  . Lumbar laminectomy/decompression microdiscectomy  07/24/2011    Procedure: LUMBAR LAMINECTOMY/DECOMPRESSION MICRODISCECTOMY;  Surgeon: Tia Alert;  Location: [facility];  Service: Neurosurgery;  Laterality: Bilateral;  Bilateral  , Lumbar Three-Four, Lumbar Four-Five Decompressive Laminectomy Rm # 32  . Thoracotomy  November 14 2010    Family History  Problem Relation Age of Onset  . COPD Mother   . Heart disease Mother   . Hypertension Mother   . Coronary artery disease Father   . Dementia Father   . Heart failure Father   . Prostate cancer Paternal Grandfather     also had bone cancer  . Breast cancer Maternal Grandmother   . Cancer Paternal Grandmother     unsure what kind   Social History:  reports that he has never smoked. He has never used smokeless tobacco. He reports that he does not drink alcohol or use illicit drugs.  Allergies: No Known Allergies  No prescriptions prior to admission    No  results found for this or any previous visit (from the past 48 hour(s)). No results found.  Review of Systems  All other systems reviewed and are negative.    There were no vitals taken for this visit. Physical Exam  Constitutional: He is oriented to person, place, and time. He appears well-developed and well-nourished.  HENT:  Head: Normocephalic and atraumatic.  Eyes: EOM are normal. Pupils are equal, round, and reactive to light.  Neck: Normal range of motion. Neck supple.  Cardiovascular: Normal rate and regular rhythm.   Respiratory: Effort normal and breath sounds normal.  GI: Soft. Bowel sounds are normal.  Musculoskeletal:       Right shoulder: He exhibits decreased range of motion, bony tenderness, crepitus, pain and decreased strength.  Neurological: He is alert and oriented to person, place, and time.  Skin: Skin is warm and dry.  Psychiatric: He has a normal mood and affect.     Assessment/Plan Severe impingement syndrome right shoulder 1)  Given the failure of conservative treatment, we will proceed to the OR today for a right shoulder arthroscopy with debridement and a subacromial decompression  Minor Iden Y 07/05/2013, 12:20 PM

## 2013-07-06 NOTE — Op Note (Signed)
NAME:  Paul Ray, Paul Ray NO.:  [id number]  MEDICAL RECORD NO.:  [id number]  LOCATION:  [facility]                         FACILITY:  [facility]  PHYSICIAN:  Vanita Panda. Magnus Ivan, M.D.DATE OF BIRTH:  August 13, 1955  DATE OF PROCEDURE:  07/05/2013 DATE OF DISCHARGE:  07/05/2013                              OPERATIVE REPORT   PREOPERATIVE DIAGNOSES: 1. Right shoulder impingement syndrome with glenohumeral arthritis and     AC joint arthritis. 2. Left thumb basilar thumb joint arthritis.  POSTOPERATIVE DIAGNOSES: 1. Right shoulder impingement syndrome with glenohumeral arthritis and     AC joint arthritis. 2. Left thumb basilar thumb joint arthritis.  PROCEDURE: 1. Right shoulder arthroscopy with extensive debridement. 2. Steroid injection in left basilar thumb joint.  FINDINGS: 1. Degenerative labral tear of right shoulder. 2. Glenohumeral arthritis right shoulder. 3. Rotator cuff tendinosis and tendinopathy with bursitis. 4. Impingement syndrome.  SURGEON:  Vanita Panda. Magnus Ivan, M.D.  ASSISTANT:  Richardean Canal, PA-C.  ANESTHESIA: 1. Regional right shoulder block. 2. General.  BLOOD LOSS:  Minimal.  COMPLICATIONS:  None.  INDICATIONS:  Paul Ray is a 56 year old gentleman well known to me.  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  His right shoulder has been bothering him for some time over the years.  We have injected it multiple times, and had him on steroids as well as anti-inflammatories and pain medication.  He has a very strong individual, but due to the continued pain,  an MRI was obtained several years ago, which did show a significant glenohumeral arthritis.  An old Bankart tear and significant impingement.  With failed conservative treatment at this point, he wished to proceed with an arthroscopic intervention.  The risks and benefits have been explained to him in detail and he does wish to  proceed with surgery.  DESCRIPTION OF PROCEDURE:  After informed consent was obtained, appropriate right shoulder and left thumb were marked, is brought to the room and placed supine on the operating room table.  General anesthesia has been obtained.  His left thumb was cleaned with Betadine and alcohol.  The time-out was called to identify the correct patient, correct left thumb.  I then placed an injection around the [facility] joint with the mixture 1 mL of Depo-Medrol and 1 mL of Marcaine, we then placed a Band-Aid over this.  We then proceeded with the shoulder portion of the case.  He was fashioned in a beach-chair position with appropriate positioning of the head and neck and padding at the down on operative left arm.  There was bending at the waist and knees and palpable pulses in his feet.  His right shoulder was then prepped and draped with DuraPrep and sterile drapes were placed in an in-line skeletal traction using fishing pole traction device and 10 pounds of traction.  Time-out was called to identify correct patient, correct right shoulder.  I then made a posterolateral arthroscopy portal near the glenohumeral joint with an arthroscopic cannula, right away we could see that there was significant arthritic changes of the glenohumeral joint, and there was degenerative tearing of the labrum throughout.  There was a small undersurface of the rotator cuff and inflamed tissue throughout the joint capsule with an anterior portal in the rotator interval.  I carried out extensive debridement of glenohumeral joint including the subscapularis tendon in the undersurface of the rotator cuff and the labrum.  I then entered subacromial space of the posterior portal and made a separate lateral portal.  I found some thickened tissue all of the rotator cuff and no significant bursitis.  Through the lateral portal, I performed a subacromial decompression with debriding the bursal surface of the  rotator cuff.  The thickened tissue off the rotator cuff and then cleaning at the [facility] joint.  I then removed all instrumentation from the shoulder and closed each portal site with interrupted nylon suture.  Xeroform and well-padded sterile dressing was applied and shoulder was placed in a sling.  He was awakened, extubated, and taken to recovery room in stable condition.  All final counts were correct and no complications noted.     Vanita Panda. Magnus Ivan, M.D.     CYB/MEDQ  D:  07/05/2013  T:  07/06/2013  Job:  [id number]

## 2013-07-07 ENCOUNTER — Encounter (HOSPITAL_COMMUNITY): Payer: Self-pay | Admitting: Orthopaedic Surgery

## 2013-07-13 ENCOUNTER — Ambulatory Visit: Payer: 59 | Attending: Orthopaedic Surgery | Admitting: Physical Therapy

## 2013-07-13 DIAGNOSIS — IMO0001 Reserved for inherently not codable concepts without codable children: Secondary | ICD-10-CM | POA: Insufficient documentation

## 2013-07-13 DIAGNOSIS — M25519 Pain in unspecified shoulder: Secondary | ICD-10-CM | POA: Insufficient documentation

## 2013-07-13 DIAGNOSIS — M25619 Stiffness of unspecified shoulder, not elsewhere classified: Secondary | ICD-10-CM | POA: Insufficient documentation

## 2013-07-15 ENCOUNTER — Ambulatory Visit: Payer: 59 | Admitting: Physical Therapy

## 2013-07-18 ENCOUNTER — Ambulatory Visit: Payer: 59 | Admitting: Physical Therapy

## 2013-07-19 ENCOUNTER — Encounter: Payer: 59 | Admitting: Rehabilitation

## 2013-07-19 ENCOUNTER — Ambulatory Visit: Payer: 59 | Admitting: Rehabilitation

## 2013-07-20 ENCOUNTER — Ambulatory Visit: Payer: 59 | Admitting: Physical Therapy

## 2013-07-20 ENCOUNTER — Encounter: Payer: 59 | Admitting: Physical Therapy

## 2013-07-25 ENCOUNTER — Encounter: Payer: 59 | Admitting: Rehabilitation

## 2013-07-25 ENCOUNTER — Ambulatory Visit: Payer: 59 | Admitting: Rehabilitation

## 2013-07-26 ENCOUNTER — Ambulatory Visit: Payer: 59 | Admitting: Physical Therapy

## 2013-07-27 ENCOUNTER — Ambulatory Visit: Payer: 59 | Admitting: Rehabilitation

## 2013-07-27 ENCOUNTER — Ambulatory Visit (INDEPENDENT_AMBULATORY_CARE_PROVIDER_SITE_OTHER): Payer: 59

## 2013-07-27 VITALS — BP 124/78 | HR 88 | Resp 12

## 2013-07-27 DIAGNOSIS — M1990 Unspecified osteoarthritis, unspecified site: Secondary | ICD-10-CM

## 2013-07-27 DIAGNOSIS — R52 Pain, unspecified: Secondary | ICD-10-CM

## 2013-07-27 DIAGNOSIS — B351 Tinea unguium: Secondary | ICD-10-CM

## 2013-07-27 DIAGNOSIS — M2020 Hallux rigidus, unspecified foot: Secondary | ICD-10-CM

## 2013-07-27 MED ORDER — MELOXICAM 15 MG PO TABS: 15.0000 mg | ORAL_TABLET | Freq: Every day | ORAL | Status: DC

## 2013-07-27 NOTE — Patient Instructions (Signed)
Onychomycosis/Fungal Toenails  WHAT IS IT? An infection that lies within the keratin of your nail plate that is caused by a fungus.  WHY ME? Fungal infections affect all ages, sexes, races, and creeds.  There may be many factors that predispose you to a fungal infection such as age, coexisting medical conditions such as diabetes, or an autoimmune disease; stress, medications, fatigue, genetics, etc.  Bottom line: fungus thrives in a warm, moist environment and your shoes offer such a location.  IS IT CONTAGIOUS? Theoretically, yes.  You do not want to share shoes, nail clippers or files with someone who has fungal toenails.  Walking around barefoot in the same room or sleeping in the same bed is unlikely to transfer the organism.  It is important to realize, however, that fungus can spread easily from one nail to the next on the same foot.  HOW DO WE TREAT THIS?  There are several ways to treat this condition.  Treatment may depend on many factors such as age, medications, pregnancy, liver and kidney conditions, etc.  It is best to ask your doctor which options are available to you.  1. No treatment.   Unlike many other medical concerns, you can live with this condition.  However for many people this can be a painful condition and may lead to ingrown toenails or a bacterial infection.  It is recommended that you keep the nails cut short to help reduce the amount of fungal nail. 2. Topical treatment.  These range from herbal remedies to prescription strength nail lacquers.  About 40-50% effective, topicals require twice daily application for approximately 9 to 12 months or until an entirely new nail has grown out.  The most effective topicals are medical grade medications available through physicians offices. 3. Oral antifungal medications.  With an 80-90% cure rate, the most common oral medication requires 3 to 4 months of therapy and stays in your system for a year as the new nail grows out.  Oral  antifungal medications do require blood work to make sure it is a safe drug for you.  A liver function panel will be performed prior to starting the medication and after the first month of treatment.  It is important to have the blood work performed to avoid any harmful side effects.  In general, this medication safe but blood work is required. 4. Laser Therapy.  This treatment is performed by applying a specialized laser to the affected nail plate.  This therapy is noninvasive, fast, and non-painful.  It is not covered by insurance and is therefore, out of pocket.  The results have been very good with a 80-95% cure rate.  [facility] is the only practice in the area to offer this therapy. 5. Permanent Nail Avulsion.  Removing the entire nail so that a new nail will not grow back.  Continue using topical Jubilee for 12 months

## 2013-07-27 NOTE — Progress Notes (Deleted)
   Subjective:    Patient ID: Paul Ray, male    DOB: 1956/06/18, 57 y.o.   MRN: [id number]  Foot Pain      Review of Systems     Objective:   Physical Exam        Assessment & Plan:

## 2013-07-27 NOTE — Progress Notes (Deleted)
   Subjective:    Patient ID: Paul Ray, male    DOB: 1955/12/12, 57 y.o.   MRN: [id number]  HPI Comments: '' B/L TOES HAVE DISCOLORATION AND THICK. TREATMENT TRIED JUBLIA FOR 2 MONTHS, BUT NOT HELPING.''  Foot Pain This is a new problem. The current episode started more than 1 month ago. The problem occurs constantly. The problem has been unchanged. The symptoms are aggravated by walking and standing. He has tried nothing for the symptoms. The treatment provided no relief.       Review of Systems  All other systems reviewed and are negative.       Objective:   Physical Exam Neurovascular status is intact pedal pulses palpable DP and PT posterior were for bilateral capillary fill time 3 seconds all digits. Neurologically epicritic and proprioceptive sensations. He intact and symmetric bilateral. Dermatologically patient does have thick brittleness and discoloration of nails hallux most significant bilateral as well as lesser digits of the second right appears to be spared. Patient been using Jubilee a topical for about 2 months however stopped I advised to continue for another 10 months he advised me daily for 12 months consecutive. There are other options however suggest he continue with the current regimen until completed. Patient is also having pain points to the MTP joint first left as well as the right fifth MTP joint. X-rays both feet demonstrate significant degenerative arthritic changes severe DJD first MTP left with history of injury trauma fracture at the IP and MTP joint. The right foot does have abnormal tone deformity talonavicular joint shows arthrosis and spurring significant promontory changes and hammertoe deformities bilateral.       Assessment & Plan:  Assessment this time degenerative arthritic changes in arthropathy both feet first MTP area left more so than right midfoot area right more than left also tailor bunion deformity right patient also is onychomycosis for  which she's encouraged to continue with topical treatment. Reappointed 2 months he continues to have difficulties my recommendation is time to maintain a firm stiff soled shoe no barefoot or flimsy shoes or flip-flops. Recommended crocs around the house. Prescription for Mobic is dispensed will discontinue the Celebrex. Followup in 2 months possible orthotic casting if he continues to have problems or difficulties. The next alternative may be surgical intervention. Next  Alvan Dame DPM

## 2013-08-01 ENCOUNTER — Encounter: Payer: 59 | Admitting: Physical Therapy

## 2013-08-03 ENCOUNTER — Encounter: Payer: 59 | Admitting: Rehabilitation

## 2013-08-05 ENCOUNTER — Ambulatory Visit: Payer: 59 | Attending: Orthopaedic Surgery | Admitting: Physical Therapy

## 2013-08-05 DIAGNOSIS — IMO0001 Reserved for inherently not codable concepts without codable children: Secondary | ICD-10-CM | POA: Insufficient documentation

## 2013-08-05 DIAGNOSIS — M25619 Stiffness of unspecified shoulder, not elsewhere classified: Secondary | ICD-10-CM | POA: Insufficient documentation

## 2013-08-05 DIAGNOSIS — M25519 Pain in unspecified shoulder: Secondary | ICD-10-CM | POA: Insufficient documentation

## 2013-08-08 ENCOUNTER — Ambulatory Visit: Payer: 59 | Admitting: Rehabilitation

## 2013-08-10 ENCOUNTER — Encounter: Payer: 59 | Admitting: Physical Therapy

## 2013-08-12 ENCOUNTER — Telehealth: Payer: Self-pay | Admitting: *Deleted

## 2013-08-12 NOTE — Telephone Encounter (Signed)
Patient called and asked how to dilute the vinegar and water and how long does he soak.  I advised him one tablespoon of vinegar per quart of water.  Continue to do soaks as long as there is drainage.

## 2013-08-15 ENCOUNTER — Encounter: Payer: 59 | Admitting: Rehabilitation

## 2013-08-16 ENCOUNTER — Ambulatory Visit: Payer: 59 | Admitting: Rehabilitation

## 2013-08-16 DIAGNOSIS — G8929 Other chronic pain: Secondary | ICD-10-CM | POA: Insufficient documentation

## 2013-08-16 DIAGNOSIS — M961 Postlaminectomy syndrome, not elsewhere classified: Secondary | ICD-10-CM | POA: Insufficient documentation

## 2013-08-16 DIAGNOSIS — M5417 Radiculopathy, lumbosacral region: Secondary | ICD-10-CM | POA: Insufficient documentation

## 2013-08-16 DIAGNOSIS — M545 Low back pain, unspecified: Secondary | ICD-10-CM | POA: Insufficient documentation

## 2013-08-17 ENCOUNTER — Ambulatory Visit: Payer: 59 | Admitting: Rehabilitation

## 2013-08-19 ENCOUNTER — Ambulatory Visit: Payer: 59 | Admitting: Physical Therapy

## 2013-08-22 ENCOUNTER — Ambulatory Visit: Payer: 59 | Admitting: Rehabilitation

## 2013-08-25 ENCOUNTER — Ambulatory Visit: Payer: 59 | Admitting: Physical Therapy

## 2013-08-29 ENCOUNTER — Encounter: Payer: 59 | Admitting: Physical Therapy

## 2013-08-31 ENCOUNTER — Encounter: Payer: 59 | Admitting: Physical Therapy

## 2013-09-01 ENCOUNTER — Encounter: Payer: 59 | Admitting: Physical Therapy

## 2013-09-20 ENCOUNTER — Ambulatory Visit: Payer: 59

## 2013-09-20 ENCOUNTER — Ambulatory Visit (INDEPENDENT_AMBULATORY_CARE_PROVIDER_SITE_OTHER): Payer: 59

## 2013-09-20 DIAGNOSIS — B351 Tinea unguium: Secondary | ICD-10-CM

## 2013-09-20 DIAGNOSIS — B070 Plantar wart: Secondary | ICD-10-CM

## 2013-09-20 NOTE — Progress Notes (Signed)
   Subjective:    Patient ID: Paul Ray, male    DOB: 12-Sep-1955, 58 y.o.   MRN: [id number]  HPI '' NEW PROBLEM FOR THE RT FOOT HAVE A KNOT AND IS PAINFUL. THIS PROBLEM STARTED 2 MONTHS AGO AND IT GETTING WORSE. ITS PAINFUL WHEN PUTTING PRESSURE AND WALKING, BUT TRIED TO SOAK WITH WARM WATER.    '' THE TOENAILS STILL LOOK THICKER.''    Review of Systems no new changes or find     Objective:   Physical Exam Neurovascular status is intact with pedal pulses palpable bilateral capillary refill time 3 seconds all digits skin temperature warm. Neurologically epicritic and proprioceptive sensations intact and symmetric. There still some thickening yellowing of the nails third right second left which shows improvement proximal clearing is noted patient is best to continue with topical antifungal therapies. Is advised we'll take a total of 12 months to clear his nails completely. Also continue with vinegar water soaks once or twice a month as recommended to. Patient has a new concern has a no T. keratotic lesion sub-fifth MTP area right also distal fourth digits bilateral show no T. keratotic lesion consistent with verruca plantaris versus porokeratosis. Lesions well-circumscribed grade with some pinpoint bleeding and debridement consistent with verruca plantaris. These are treating with lumicain and Neosporin being applied       Assessment & Plan:  Assessment patient is improving onychomycosis of the nails. Of her right second left will continue topical antifungal therapy as recommended instructed. New problem of Percocet plantaris bilateral feet. Patient does have wart sub-fifth metatarsal area right foot and distal fourth digits bilateral. These lesions are debrided away at this time down to a pinpoint bleeding treated with lumicain and Neosporin. Patient is given instructions for topical salicylic acid application reconstruction for work treatment utilizing salicylic acid and duct tape under  occlusion. Patient will followup in the next 2-3 months he continues any problems or new symptoms. She also patient needs to have arthropathy the first MTP joints bilateral with bone spurring and hallux limitus. Patient will also possibly future command to have evaluated his previous or old orthotics for adjustments if needed.  Harriet Masson DPM

## 2013-09-20 NOTE — Patient Instructions (Signed)

## 2013-09-27 ENCOUNTER — Telehealth: Payer: Self-pay | Admitting: *Deleted

## 2013-09-27 NOTE — Telephone Encounter (Signed)
Pt asked if he put the duct tape right on the skin after applying Compound W and did he keep the area dry when showering.  I offered to send pt a instruction sheet about the topical treatment of warts, but pt states he received one at the last visit.  I told him to shower with the duct tape if difficult to remove, then remove and apply the Compound W after roughing up the skin with an [employer].  Pt states understanding.

## 2013-11-10 ENCOUNTER — Other Ambulatory Visit (HOSPITAL_COMMUNITY): Payer: Self-pay | Admitting: Orthopaedic Surgery

## 2013-11-10 DIAGNOSIS — M549 Dorsalgia, unspecified: Secondary | ICD-10-CM

## 2013-11-21 ENCOUNTER — Other Ambulatory Visit (HOSPITAL_COMMUNITY): Payer: Self-pay | Admitting: Orthopaedic Surgery

## 2013-11-21 DIAGNOSIS — R52 Pain, unspecified: Secondary | ICD-10-CM

## 2013-11-21 DIAGNOSIS — R609 Edema, unspecified: Secondary | ICD-10-CM

## 2013-11-22 ENCOUNTER — Ambulatory Visit (HOSPITAL_COMMUNITY)
Admission: RE | Admit: 2013-11-22 | Discharge: 2013-11-22 | Disposition: A | Discharge status: Home / Self Care | Payer: 59 | Source: Ambulatory Visit | Attending: Orthopaedic Surgery | Admitting: Orthopaedic Surgery

## 2013-11-22 DIAGNOSIS — R609 Edema, unspecified: Secondary | ICD-10-CM

## 2013-11-22 DIAGNOSIS — R52 Pain, unspecified: Secondary | ICD-10-CM | POA: Insufficient documentation

## 2014-01-17 ENCOUNTER — Other Ambulatory Visit: Payer: Self-pay | Admitting: Orthopedic Surgery

## 2014-01-23 ENCOUNTER — Encounter (HOSPITAL_BASED_OUTPATIENT_CLINIC_OR_DEPARTMENT_OTHER): Payer: Self-pay | Admitting: *Deleted

## 2014-01-23 NOTE — Progress Notes (Signed)
No labs needed

## 2014-01-25 NOTE — H&P (Signed)
Paul Ray is an 58 y.o. male.   Chief Complaint: c/o chronic and progressive left thumb CMC joint pain HPI: Paul Ray is a well known patient who is s/p reconstruction of his right lateral elbow extensor origin by myself January 2006. He has had an excellent long term result. I followed Paul Ray for left thumb trapezial metacarpal arthritis for years. He has Paul Ray stage III changes and is impaired working as a Games developer.     Past Medical History  Diagnosis Date  . Hepatitis C   . Hemorrhoids   . Abscess of lung(513.0)     Right lower lobe  . Empyema lung   . ADD (attention deficit disorder)   . Bipolar depression   . Pneumonia     last year  . Prostate abscess   . Prostate troubles   . Arthritis   . Anxiety     Past Surgical History  Procedure Laterality Date  . Foot neuroma surgery  2006    Left  . Elbow surgery  2005    Right  . Lung surgery      for empyema  . Hip resurfacing      left hip [facility] 01/2011  . Lumbar laminectomy/decompression microdiscectomy  07/24/2011    Procedure: LUMBAR LAMINECTOMY/DECOMPRESSION MICRODISCECTOMY;  Surgeon: Paul Ray;  Location: [place] [facility];  Service: Neurosurgery;  Laterality: Bilateral;  Bilateral  , Lumbar Three-Four, Lumbar Four-Five Decompressive Laminectomy Rm # [phone]  . Thoracotomy  November 14 2010    rt  . Shoulder arthroscopy Right 07/05/2013    Procedure: RIGHT SHOULDER ARTHROSCOPY WITH DEBRIDEMENT;  Surgeon: Paul Rossetti, MD;  Location: [facility];  Service: Orthopedics;  Laterality: Right;    Family History  Problem Relation Age of Onset  . COPD Mother   . Heart disease Mother   . Hypertension Mother   . Coronary artery disease Father   . Dementia Father   . Heart failure Father   . Prostate cancer Paternal Grandfather     also had bone cancer  . Breast cancer Maternal Grandmother   . Cancer Paternal Grandmother     unsure what kind   Social History:  reports that he has never smoked. He has never used  smokeless tobacco. He reports that he does not drink alcohol or use illicit drugs.  Allergies: No Known Allergies  No prescriptions prior to admission    No results found for this or any previous visit (from the past 48 hour(s)).  No results found.   Pertinent items are noted in HPI.  Height 5\' 9"  (1.753 m), weight 79.379 kg (175 lb).  General appearance: alert Head: Normocephalic, without obvious abnormality Neck: supple, symmetrical, trachea midline Resp: clear to auscultation bilaterally Cardio: regular rate and rhythm GI: normal findings: bowel sounds normal Extremities: His thumb has a palpable osteophyte at the trapezium. He has pain with CMC translation and grind. He is developing a slight type I Z-collapse. He does not show signs of stenosing tenosynovitis of the first dorsal compartment.  X-rays of his thumb documents Eaton stage III changes with bone on bone arthropathy.   Pulses: 2+ and symmetric Skin: normal Neurologic: Grossly normal    Assessment/Plan Impression: End stage OA left thumb CMC joint  Plan:To the OR for left hand trapeziectomy with knotted tendon interposition and transfer of abductor pollicis longus to thenar muscle fascia..The procedure, risks,benefits and post-op course were discussed with the patient at length and they were in agreement with the plan.  Paul Ray 01/25/2014, 12:59 PM  H&P documentation: 01/26/2014  -History and Physical Reviewed  -Patient has been re-examined  -No change in the plan of care  Paul Sickle, MD

## 2014-01-26 ENCOUNTER — Ambulatory Visit (HOSPITAL_BASED_OUTPATIENT_CLINIC_OR_DEPARTMENT_OTHER): Payer: 59 | Admitting: Anesthesiology

## 2014-01-26 ENCOUNTER — Encounter (HOSPITAL_BASED_OUTPATIENT_CLINIC_OR_DEPARTMENT_OTHER): Payer: 59 | Admitting: Anesthesiology

## 2014-01-26 ENCOUNTER — Encounter (HOSPITAL_BASED_OUTPATIENT_CLINIC_OR_DEPARTMENT_OTHER)
Admission: RE | Disposition: A | Discharge status: Home / Self Care | Payer: Self-pay | Source: Ambulatory Visit | Attending: Orthopedic Surgery

## 2014-01-26 ENCOUNTER — Encounter (HOSPITAL_BASED_OUTPATIENT_CLINIC_OR_DEPARTMENT_OTHER): Payer: Self-pay | Admitting: Anesthesiology

## 2014-01-26 ENCOUNTER — Ambulatory Visit (HOSPITAL_BASED_OUTPATIENT_CLINIC_OR_DEPARTMENT_OTHER)
Admission: RE | Admit: 2014-01-26 | Discharge: 2014-01-26 | Disposition: A | Discharge status: Home / Self Care | Payer: 59 | Source: Ambulatory Visit | Attending: Orthopedic Surgery | Admitting: Orthopedic Surgery

## 2014-01-26 DIAGNOSIS — F411 Generalized anxiety disorder: Secondary | ICD-10-CM | POA: Insufficient documentation

## 2014-01-26 DIAGNOSIS — B182 Chronic viral hepatitis C: Secondary | ICD-10-CM | POA: Insufficient documentation

## 2014-01-26 DIAGNOSIS — M19049 Primary osteoarthritis, unspecified hand: Secondary | ICD-10-CM | POA: Insufficient documentation

## 2014-01-26 DIAGNOSIS — F319 Bipolar disorder, unspecified: Secondary | ICD-10-CM | POA: Insufficient documentation

## 2014-01-26 DIAGNOSIS — Z8701 Personal history of pneumonia (recurrent): Secondary | ICD-10-CM | POA: Insufficient documentation

## 2014-01-26 DIAGNOSIS — F988 Other specified behavioral and emotional disorders with onset usually occurring in childhood and adolescence: Secondary | ICD-10-CM | POA: Insufficient documentation

## 2014-01-26 HISTORY — PX: TENDON TRANSFER: SHX6109

## 2014-01-26 HISTORY — DX: Anxiety disorder, unspecified: F41.9

## 2014-01-26 SURGERY — TRANSFER, TENDON
Anesthesia: General | Site: Thumb | Laterality: Left

## 2014-01-26 MED ORDER — CEPHALEXIN 500 MG PO CAPS: 500.0000 mg | ORAL_CAPSULE | Freq: Three times a day (TID) | ORAL | Status: DC

## 2014-01-26 MED ORDER — BUPIVACAINE HCL (PF) 0.25 % IJ SOLN
INTRAMUSCULAR | Status: AC
  Filled 2014-01-26: qty 30

## 2014-01-26 MED ORDER — OXYCODONE HCL 5 MG PO TABS
5.0000 mg | ORAL_TABLET | Freq: Once | ORAL | Status: AC | PRN
  Administered 2014-01-26: 5 mg via ORAL

## 2014-01-26 MED ORDER — ONDANSETRON HCL 4 MG/2ML IJ SOLN
INTRAMUSCULAR | Status: DC | PRN
  Administered 2014-01-26: 4 mg via INTRAVENOUS

## 2014-01-26 MED ORDER — FENTANYL CITRATE 0.05 MG/ML IJ SOLN
INTRAMUSCULAR | Status: DC | PRN
  Administered 2014-01-26: 100 ug via INTRAVENOUS
  Administered 2014-01-26 (×4): 25 ug via INTRAVENOUS

## 2014-01-26 MED ORDER — HYDROMORPHONE HCL 2 MG PO TABS
2.0000 mg | ORAL_TABLET | ORAL | Status: DC | PRN
Start: 2014-01-26 — End: 2014-06-05

## 2014-01-26 MED ORDER — HYDROMORPHONE HCL PF 1 MG/ML IJ SOLN
INTRAMUSCULAR | Status: AC
  Filled 2014-01-26: qty 1

## 2014-01-26 MED ORDER — MIDAZOLAM HCL 5 MG/5ML IJ SOLN
INTRAMUSCULAR | Status: DC | PRN
  Administered 2014-01-26: 2 mg via INTRAVENOUS

## 2014-01-26 MED ORDER — BUPIVACAINE HCL 0.25 % IJ SOLN
INTRAMUSCULAR | Status: DC | PRN
  Administered 2014-01-26: 7 mL

## 2014-01-26 MED ORDER — LACTATED RINGERS IV SOLN
INTRAVENOUS | Status: DC
  Administered 2014-01-26 (×3): via INTRAVENOUS

## 2014-01-26 MED ORDER — DEXAMETHASONE SODIUM PHOSPHATE 4 MG/ML IJ SOLN
INTRAMUSCULAR | Status: DC | PRN
  Administered 2014-01-26: 10 mg via INTRAVENOUS

## 2014-01-26 MED ORDER — MIDAZOLAM HCL 2 MG/2ML IJ SOLN
INTRAMUSCULAR | Status: AC
  Filled 2014-01-26: qty 2

## 2014-01-26 MED ORDER — CHLORHEXIDINE GLUCONATE 4 % EX LIQD: 60.0000 mL | Freq: Once | CUTANEOUS | Status: DC

## 2014-01-26 MED ORDER — FENTANYL CITRATE 0.05 MG/ML IJ SOLN
INTRAMUSCULAR | Status: AC
  Filled 2014-01-26: qty 6

## 2014-01-26 MED ORDER — HYDROMORPHONE HCL 2 MG PO TABS: ORAL_TABLET | ORAL | Status: DC

## 2014-01-26 MED ORDER — MIDAZOLAM HCL 2 MG/2ML IJ SOLN: 1.0000 mg | INTRAMUSCULAR | Status: DC | PRN

## 2014-01-26 MED ORDER — CEFAZOLIN SODIUM-DEXTROSE 2-3 GM-% IV SOLR
INTRAVENOUS | Status: AC
  Filled 2014-01-26: qty 50

## 2014-01-26 MED ORDER — FENTANYL CITRATE 0.05 MG/ML IJ SOLN: 50.0000 ug | INTRAMUSCULAR | Status: DC | PRN

## 2014-01-26 MED ORDER — PROPOFOL 10 MG/ML IV BOLUS
INTRAVENOUS | Status: DC | PRN
  Administered 2014-01-26: 300 mg via INTRAVENOUS

## 2014-01-26 MED ORDER — OXYCODONE HCL 5 MG PO TABS
ORAL_TABLET | ORAL | Status: AC
  Filled 2014-01-26: qty 1

## 2014-01-26 MED ORDER — OXYCODONE HCL 5 MG/5ML PO SOLN: 5.0000 mg | Freq: Once | ORAL | Status: AC | PRN

## 2014-01-26 MED ORDER — HYDROMORPHONE HCL PF 1 MG/ML IJ SOLN
0.2500 mg | INTRAMUSCULAR | Status: AC | PRN
Start: 2014-01-26 — End: 2014-01-26
  Administered 2014-01-26 (×8): 0.5 mg via INTRAVENOUS

## 2014-01-26 MED ORDER — CEFAZOLIN SODIUM-DEXTROSE 2-3 GM-% IV SOLR
2.0000 g | INTRAVENOUS | Status: AC
  Administered 2014-01-26: 2 g via INTRAVENOUS

## 2014-01-26 MED ORDER — LIDOCAINE HCL (CARDIAC) 20 MG/ML IV SOLN
INTRAVENOUS | Status: DC | PRN
  Administered 2014-01-26: 100 mg via INTRAVENOUS

## 2014-01-26 MED ORDER — EPHEDRINE SULFATE 50 MG/ML IJ SOLN
INTRAMUSCULAR | Status: DC | PRN
  Administered 2014-01-26 (×2): 10 mg via INTRAVENOUS

## 2014-01-26 SURGICAL SUPPLY — 63 items
BANDAGE ADH SHEER 1  50/CT (GAUZE/BANDAGES/DRESSINGS) IMPLANT
BANDAGE COBAN STERILE 2 (GAUZE/BANDAGES/DRESSINGS) IMPLANT
BANDAGE ELASTIC 3 VELCRO ST LF (GAUZE/BANDAGES/DRESSINGS) ×2 IMPLANT
BLADE MINI RND TIP GREEN BEAV (BLADE) ×2 IMPLANT
BLADE SURG 15 STRL LF DISP TIS (BLADE) ×1 IMPLANT
BLADE SURG 15 STRL SS (BLADE) ×2
BNDG CMPR 9X4 STRL LF SNTH (GAUZE/BANDAGES/DRESSINGS) ×1
BNDG CMPR MD 5X2 ELC HKLP STRL (GAUZE/BANDAGES/DRESSINGS) ×1
BNDG COHESIVE 3X5 TAN STRL LF (GAUZE/BANDAGES/DRESSINGS) IMPLANT
BNDG ELASTIC 2 VLCR STRL LF (GAUZE/BANDAGES/DRESSINGS) ×1 IMPLANT
BNDG ESMARK 4X9 LF (GAUZE/BANDAGES/DRESSINGS) ×2 IMPLANT
BNDG GAUZE ELAST 4 BULKY (GAUZE/BANDAGES/DRESSINGS) ×3 IMPLANT
BRUSH SCRUB EZ PLAIN DRY (MISCELLANEOUS) ×2 IMPLANT
CORDS BIPOLAR (ELECTRODE) ×2 IMPLANT
COVER MAYO STAND STRL (DRAPES) ×2 IMPLANT
COVER TABLE BACK 60X90 (DRAPES) ×2 IMPLANT
CUFF TOURNIQUET SINGLE 18IN (TOURNIQUET CUFF) ×1 IMPLANT
DECANTER SPIKE VIAL GLASS SM (MISCELLANEOUS) ×1 IMPLANT
DRAPE EXTREMITY T 121X128X90 (DRAPE) ×2 IMPLANT
DRAPE SURG 17X23 STRL (DRAPES) ×2 IMPLANT
GAUZE SPONGE 4X4 12PLY STRL (GAUZE/BANDAGES/DRESSINGS) ×2 IMPLANT
GLOVE BIO SURGEON STRL SZ 6.5 (GLOVE) ×1 IMPLANT
GLOVE BIO SURGEON STRL SZ7.5 (GLOVE) ×1 IMPLANT
GLOVE BIOGEL M STRL SZ7.5 (GLOVE) ×2 IMPLANT
GLOVE BIOGEL PI IND STRL 7.0 (GLOVE) IMPLANT
GLOVE BIOGEL PI IND STRL 8 (GLOVE) IMPLANT
GLOVE BIOGEL PI INDICATOR 7.0 (GLOVE) ×2
GLOVE BIOGEL PI INDICATOR 8 (GLOVE) ×1
GLOVE ORTHO TXT STRL SZ7.5 (GLOVE) ×2 IMPLANT
GOWN STRL REUS W/ TWL LRG LVL3 (GOWN DISPOSABLE) ×1 IMPLANT
GOWN STRL REUS W/ TWL XL LVL3 (GOWN DISPOSABLE) ×2 IMPLANT
GOWN STRL REUS W/TWL LRG LVL3 (GOWN DISPOSABLE) ×2
GOWN STRL REUS W/TWL XL LVL3 (GOWN DISPOSABLE) ×6
LOOP VESSEL MAXI BLUE (MISCELLANEOUS) IMPLANT
NEEDLE 27GAX1X1/2 (NEEDLE) ×1 IMPLANT
NS IRRIG 1000ML POUR BTL (IV SOLUTION) ×2 IMPLANT
PACK BASIN DAY SURGERY FS (CUSTOM PROCEDURE TRAY) ×2 IMPLANT
PAD CAST 3X4 CTTN HI CHSV (CAST SUPPLIES) IMPLANT
PADDING CAST ABS 3INX4YD NS (CAST SUPPLIES) ×1
PADDING CAST ABS 4INX4YD NS (CAST SUPPLIES) ×1
PADDING CAST ABS COTTON 3X4 (CAST SUPPLIES) IMPLANT
PADDING CAST ABS COTTON 4X4 ST (CAST SUPPLIES) ×1 IMPLANT
PADDING CAST COTTON 3X4 STRL (CAST SUPPLIES)
PADDING UNDERCAST 2 STRL (CAST SUPPLIES)
PADDING UNDERCAST 2X4 STRL (CAST SUPPLIES) IMPLANT
PASSER SUT SWANSON 36MM LOOP (INSTRUMENTS) ×2 IMPLANT
SLEEVE SCD COMPRESS KNEE MED (MISCELLANEOUS) ×1 IMPLANT
SPLINT PLASTER CAST XFAST 3X15 (CAST SUPPLIES) IMPLANT
SPLINT PLASTER XTRA FASTSET 3X (CAST SUPPLIES)
STOCKINETTE 4X48 STRL (DRAPES) ×2 IMPLANT
STRIP CLOSURE SKIN 1/2X4 (GAUZE/BANDAGES/DRESSINGS) ×1 IMPLANT
SUT ETHIBOND 3-0 V-5 (SUTURE) ×1 IMPLANT
SUT PROLENE 3 0 PS 2 (SUTURE) ×1 IMPLANT
SUT SILK 4 0 PS 2 (SUTURE) ×1 IMPLANT
SUT VIC AB 4-0 P-3 18XBRD (SUTURE) ×1 IMPLANT
SUT VIC AB 4-0 P3 18 (SUTURE) ×2
SYR 3ML 23GX1 SAFETY (SYRINGE) IMPLANT
SYR BULB 3OZ (MISCELLANEOUS) ×2 IMPLANT
SYRINGE CONTROL L 12CC (SYRINGE) ×2 IMPLANT
SYRINGE CONTROL LL 12CC (SYRINGE) IMPLANT
TOWEL OR NON WOVEN STRL DISP B (DISPOSABLE) ×2 IMPLANT
TRAY DSU PREP LF (CUSTOM PROCEDURE TRAY) ×2 IMPLANT
UNDERPAD 30X30 INCONTINENT (UNDERPADS AND DIAPERS) ×2 IMPLANT

## 2014-01-26 NOTE — Anesthesia Procedure Notes (Signed)
Procedure Name: LMA Insertion Date/Time: 01/26/2014 12:09 PM Performed by: Lyndee Leo Pre-anesthesia Checklist: Patient identified, Emergency Drugs available, Suction available and Patient being monitored Patient Re-evaluated:Patient Re-evaluated prior to inductionOxygen Delivery Method: Circle System Utilized Preoxygenation: Pre-oxygenation with 100% oxygen Intubation Type: IV induction Ventilation: Mask ventilation without difficulty LMA: LMA inserted LMA Size: 5.0 Number of attempts: 1 Airway Equipment and Method: bite block Placement Confirmation: positive ETCO2 Tube secured with: Tape Dental Injury: Teeth and Oropharynx as per pre-operative assessment

## 2014-01-26 NOTE — Brief Op Note (Signed)
01/26/2014  1:26 PM  PATIENT:  [name]  58 y.o. male  PRE-OPERATIVE DIAGNOSIS:  LEFT THUMB DEGENERATIVE JOINT DISEASE  POST-OPERATIVE DIAGNOSIS:  LEFT THUMB DEGENERATIVE JOINT DISEASE  PROCEDURE:  Procedure(s): LEFT THUMB TRAPEZIECTOMY KNOTTED TENDON INTRAPOSITION TRANSFER OF ABDUCTOR POLLICIS LONGUS TO THENARS (Left)  SURGEON:  Surgeon(s) and Role:    * Cammie Sickle, MD - Primary  PHYSICIAN ASSISTANT:   ASSISTANTS: Kathyrn Sheriff.A-C   ANESTHESIA:   general  EBL:  Total I/O In: 1600 [I.V.:1600] Out: -   BLOOD ADMINISTERED:none  DRAINS: none   LOCAL MEDICATIONS USED:  MARCAINE     SPECIMEN:  No Specimen  DISPOSITION OF SPECIMEN:  N/A  COUNTS:  YES  TOURNIQUET:  * Missing tourniquet times found for documented tourniquets in log:  [id number] *  DICTATION: .Other Dictation: Dictation Number [id number]  PLAN OF CARE: Discharge to home after PACU  PATIENT DISPOSITION:  PACU - hemodynamically stable.   Delay start of Pharmacological VTE agent (>24hrs) due to surgical blood loss or risk of bleeding: not applicable

## 2014-01-26 NOTE — Discharge Instructions (Addendum)
[facility] Instructions Hand Surgery  Wound Care: Keep your hand elevated above the level of your heart.  Do not allow it to dangle by your side.  Keep the dressing dry and do not remove it unless your doctor advises you to do so.  He will usually change it at the time of your post-op visit.  Moving your fingers is advised to stimulate circulation but will depend on the site of your surgery.  If you have a splint applied, your doctor will advise you regarding movement.  Activity: Do not drive or operate machinery today.  Rest today and then you may return to your normal activity and work as indicated by your physician.  Diet:  Drink liquids today or eat a light diet.  You may resume a regular diet tomorrow.    General expectations: Pain for two to three days. Fingers may become slightly swollen.  Call your doctor if any of the following occur: Severe pain not relieved by pain medication. Elevated temperature. Dressing soaked with blood. Inability to move fingers. White or bluish color to fingers.  Take Aleve two tablets in the am and two tablets in the pm with meals.  Use the dilaudid for the first few days post op but expect to wean off rapidly after three or four days.  Dr Daylene Katayama is on call for the next four days to troubleshoot post op issues.   Post Anesthesia Home Care Instructions  Activity: Get plenty of rest for the remainder of the day. A responsible adult should stay with you for 24 hours following the procedure.  For the next 24 hours, DO NOT: -Drive a car -Paediatric nurse -Drink alcoholic beverages -Take any medication unless instructed by your physician -Make any legal decisions or sign important papers.  Meals: Start with liquid foods such as gelatin or soup. Progress to regular foods as tolerated. Avoid greasy, spicy, heavy foods. If nausea and/or vomiting occur, drink only clear liquids until the nausea and/or vomiting subsides. Call your physician if  vomiting continues.  Special Instructions/Symptoms: Your throat may feel dry or sore from the anesthesia or the breathing tube placed in your throat during surgery. If this causes discomfort, gargle with warm salt water. The discomfort should disappear within 24 hours.

## 2014-01-26 NOTE — Transfer of Care (Signed)
Immediate Anesthesia Transfer of Care Note  Patient: Paul Ray  Procedure(s) Performed: Procedure(s): LEFT THUMB TRAPEZIECTOMY KNOTTED TENDON INTRAPOSITION TRANSFER OF ABDUCTOR POLLICIS LONGUS TO THENARS (Left)  Patient Location: PACU  Anesthesia Type:General  Level of Consciousness: sedated  Airway & Oxygen Therapy: Patient Spontanous Breathing and Patient connected to face mask oxygen  Post-op Assessment: Report given to PACU RN and Post -op Vital signs reviewed and stable  Post vital signs: Reviewed and stable  Complications: No apparent anesthesia complications

## 2014-01-26 NOTE — Op Note (Signed)
NAME:  Paul Ray, Paul Ray NO.:  [id number]  MEDICAL RECORD NO.:  [id number]  LOCATION:                                 FACILITY:  PHYSICIAN:  Youlanda Mighty. Tobin Witucki, M.D. DATE OF BIRTH:  1955-09-01  DATE OF PROCEDURE:  01/26/2014 DATE OF DISCHARGE:                              OPERATIVE REPORT   PREOPERATIVE DIAGNOSIS:  Severe osteoarthritis, left thumb carpometacarpal joint with bone-on-bone arthropathy.  POSTOPERATIVE DIAGNOSIS:  Severe osteoarthritis, left thumb carpometacarpal joint with bone-on-bone arthropathy.  OPERATION: 1. Resection of left trapezium with synovectomy and removal of loose     bodies. 2. Knotted tendon interposition arthroplasty, invaginating the central     slip of the abductor pollicis longus and the flexor carpi radialis     in the manner of Garcia-Elias. 3. Transfer of dorsal and palmar slips of abductor pollicis longus to     thenar muscles to augment palmar abduction of left thumb.  OPERATING SURGEON:  Youlanda Mighty. Brigette Hopfer, M.D.  ASSISTANT:  Julian Reil, P.A.  ANESTHESIA:  General by LMA supplemented by a postoperative Marcaine field block for perioperative comfort.  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  He has had severe right lateral elbow tendinopathy, treated with reconstruction many years ago with a successful outcome.  He has widespread osteoarthritis of his hands, thumbs, and spinal arthritis. He has had shoulder surgery by Dr. Ninfa Linden.  Recently, he returned to my office to discuss severe pain at the base of his left thumb.  We have followed him for a number of years and he is very aware of the fact that he has a familial osteoarthritis predicament.  He had reached the point where he could no longer function with his left thumb.  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  We had detailed informed consent about reconstruction of thumbs.  He understands we can never provide the degree of strength for pinch and grasp that he had before he had arthritis; however, we do have excellent salvage procedures with trapezium resection and tendon interposition. This should provide a mobile thumb with about 10 to 15 pounds of pinch and at times, can be as much as 25 pounds of pinch.  These operations in general are quite successful in relieving pain.  He also understands that there are a number of recognized potential complications to the surgery that he needs to be aware of including possible infection, failure to relieve all his pain, occasional instances of regional pain syndrome type 1 or type 2.  There are multiple sensory nerve branches in the region of surgery that are very easy to stretch with retraction and it is very common to have slight numbness in the region of the thenar eminence following surgery.  Should he develop chronic regional pain syndrome with this surgery due to surgery in the proximity of the radial artery and/or radial superficial sensory branches, he would require further treatment from an anesthesiologist with stellate ganglion blocks and multiple medications to control  CRPS type 1 or 2 symptoms.  Mr. Vandevoort was also made aware of the fact that I am retiring from clinical practice in September 2015.  We have a 10-week window to provide after care.  He understands that he will not reach maximal medical improvement for 4 to 5 months following the surgery.  We have had a detailed informed consent about all these issues both in my office and in the holding area.  I have been his surgeon in the past, he was very keenly interested in me providing the services at this time.  He is willing to accept care by another physician for late after care as needed.  DESCRIPTION OF PROCEDURE:  Donell Tomkins was interviewed by  Dr. Ola Spurr in the holding area and offered a perioperative plexus block.  Following an experience with a block prior to shoulder surgery, he declined a block.  Dr. Ola Spurr recommended general anesthesia by LMA technique.  My plan was to provide a regional block intraoperatively for the thumb postoperative pain management.  Mr. Bienvenue left hand and wrist were marked as the proper surgical site per protocol with a marking pen.  He was transferred to room 2 of the [facility] and placed in supine position on the operating table.  With Dr. Blane Ohara direct supervision, general anesthesia by LMA technique induced.  A 2 g of Ancef was administered as an IV prophylactic antibiotic.  The left hand and arm was scrubbed with Betadine soap and solution, sterilely draped with a stockinette and impervious arthroscopy drapes.  Following exsanguination of the left arm with Esmarch bandage, the arterial tourniquet on the proximal brachium was inflated to 220 mmHg.  A routine surgical time-out was accomplished.  Procedure commenced with a Wagner curvilinear incision, paralleling the glabrous skin of the thenar eminence, extending to the distal wrist flexion crease.  This extended with a Brunner zigzag incision.  Great care was taken to identify the lateral antebrachial cutaneous sensory branches as well as the superficial radial sensory branches.  These were gently retracted. The vascular structures including the radial artery were identified followed by identification of the multiple slips of the abductor pollicis longus.  Mr. Dery had 3 slips; a substantial palmar slip, a larger central slip, and a smaller dorsal slip.  He also had a very robust flexor carpi radialis.  The first dorsal compartment was split and a septum between the extensor pollicis brevis and abductor pollicis longus released to prevent postoperative stenosing tenosynovitis.  We elevated the thenar muscles  and the capsule of the CMC joint, identifying the thumb metacarpal base precisely.  We then performed a subperiosteal exposure of the trapezium utilizing a razor sharp 4-mm osteotome, scissors, and periosteal elevators.  We identified the position of the flexor carpi radialis and then removed the entire trapezium in piecemeal followed by synovectomy of the carpometacarpal joint and removal of all loose bodies between the thumb metacarpal and the index metacarpal.  We then obtained hemostasis.  The radial artery was carefully protected with a Soil scientist and other retractors during the resection.  Attention was then directed to harvest of the radial third of the flexor carpi radialis at the musculotendinous junction.  This was meticulously dissected from the muscle belly and then split and passed distally with a Carroll tendon passing forceps. The slip of tendon was then passed into the cavity, created by trapezium resection with a Swanson suture passer and split to its origin at the base of the index metacarpal.  We then selected the larger central slip of the abductor pollicis longus as the invaginating tendon.  This was then invaginated in the knotted manner as described by Garcia-Elias with 3 Windsor knots stacked.  The knots were secured with interrupted mattress sutures of 3-0 Ethibond.  The invagination of the abductor pollicis longus did cause slight compression of the radial artery, however, this was noted with any abduction of the thumb or moving the wrist to be minimal.  After completion of the knot stack, we then secured the knot stack with a series of mattress sutures of 3-0 Ethibond followed by anatomic repair of the thenar muscle fascia to the fascia and ligaments of the scaphotrapezial joint with a series of figure-of-eight sutures of 3-0 Ethibond knots buried.  We then distally transferred the dorsal slip of the abductor pollicis longus 1 cm more distal and slightly  palmar through the thenar fascia, securing it with a 3 pass Pulvertaft weave, secured by corner sutures of 3-0 Ethibond.  A very satisfactory construct was achieved with excellent mobility of thumb in a secured beak ligament effect.  There was no impingement between the thumb metacarpal and trapezoid or the scaphoid.  The wounds were thoroughly irrigated with sterile saline followed by repair of the skin with subcutaneous 4-0 Vicryl and intradermal 4-0 Prolene with Steri-Strips.  The tourniquet was released with immediate capillary fill of the fingers and thumb.  His wound margins were infiltrated with 0.25% Marcaine for long-acting postoperative analgesia. He was placed in compressive dressing with sterile gauze, Kerlix, sterile Webril, and a plaster sandwich splint, maintaining the thumb in a palmar abduction position.  There were no apparent complications. He was awakened from general anesthesia and transferred to the PACU with stable signs.  I have had extensive experience working with Mr. Ericsson in the past.  He has had many challenges with pain management.  We talked preoperatively about how to manage his pain and suggested that he use naproxen sodium 440 mg p.o. b.i.d. with food and I am giving him Dilaudid 2 mg to be taken 1 to 2 tablets every 4 to 6 hours p.r.n. pain, he will be provided 30 tablets.  Mr. Orndoff has demonstrated extreme narcotic tolerance in the past.  We will be mindful of these issues in the early postoperative period.  He understands I am on call for the next 4 days and can help manage his pain issues as needed.     Youlanda Mighty Gavan Nordby, M.D.     RVS/MEDQ  D:  01/26/2014  T:  01/26/2014  Job:  [id number]

## 2014-01-26 NOTE — Anesthesia Postprocedure Evaluation (Signed)
  Anesthesia Post-op Note  Patient: Paul Ray  Procedure(s) Performed: Procedure(s): LEFT THUMB TRAPEZIECTOMY KNOTTED TENDON INTRAPOSITION TRANSFER OF ABDUCTOR POLLICIS LONGUS TO THENARS (Left)  Patient Location: PACU  Anesthesia Type:General  Level of Consciousness: awake, alert  and oriented  Airway and Oxygen Therapy: Patient Spontanous Breathing  Post-op Pain: moderate  Post-op Assessment: Post-op Vital signs reviewed  Post-op Vital Signs: Reviewed  Last Vitals:  Filed Vitals:   01/26/14 1512  BP: 137/77  Pulse: 93  Temp: 36.4 C  Resp: 18    Complications: No apparent anesthesia complications

## 2014-01-26 NOTE — Anesthesia Preprocedure Evaluation (Signed)
Anesthesia Evaluation  Patient identified by MRN, date of birth, ID band Patient awake    Reviewed: Allergy & Precautions, H&P , NPO status , Patient's Chart, lab work & pertinent test results  Airway Mallampati: II TM Distance: >3 FB Neck ROM: Full    Dental no notable dental hx. (+) Teeth Intact, Dental Advisory Given   Pulmonary neg pulmonary ROS,  breath sounds clear to auscultation  Pulmonary exam normal       Cardiovascular negative cardio ROS  Rhythm:Regular Rate:Normal     Neuro/Psych Anxiety Depression Bipolar Disorder negative neurological ROS     GI/Hepatic negative GI ROS, (+) Hepatitis -, C  Endo/Other  negative endocrine ROS  Renal/GU negative Renal ROS  negative genitourinary   Musculoskeletal   Abdominal   Peds  (+) ATTENTION DEFICIT DISORDER WITHOUT HYPERACTIVITY Hematology negative hematology ROS (+)   Anesthesia Other Findings   Reproductive/Obstetrics negative OB ROS                           Anesthesia Physical Anesthesia Plan  ASA: II  Anesthesia Plan: General   Post-op Pain Management:    Induction: Intravenous  Airway Management Planned: LMA  Additional Equipment:   Intra-op Plan:   Post-operative Plan: Extubation in OR  Informed Consent: I have reviewed the patients History and Physical, chart, labs and discussed the procedure including the risks, benefits and alternatives for the proposed anesthesia with the patient or authorized representative who has indicated his/her understanding and acceptance.   Dental advisory given  Plan Discussed with: CRNA and Surgeon  Anesthesia Plan Comments: (Pt refuses block)        Anesthesia Quick Evaluation

## 2014-01-26 NOTE — Op Note (Signed)
[id number] 

## 2014-01-30 ENCOUNTER — Encounter (HOSPITAL_BASED_OUTPATIENT_CLINIC_OR_DEPARTMENT_OTHER): Payer: Self-pay | Admitting: Orthopedic Surgery

## 2014-04-17 DIAGNOSIS — M5416 Radiculopathy, lumbar region: Secondary | ICD-10-CM | POA: Insufficient documentation

## 2014-05-09 ENCOUNTER — Other Ambulatory Visit: Payer: Self-pay

## 2014-06-05 ENCOUNTER — Encounter: Payer: Self-pay | Admitting: Podiatry

## 2014-06-05 ENCOUNTER — Ambulatory Visit (INDEPENDENT_AMBULATORY_CARE_PROVIDER_SITE_OTHER): Payer: 59 | Admitting: Podiatry

## 2014-06-05 VITALS — BP 124/73 | HR 73 | Resp 16

## 2014-06-05 DIAGNOSIS — L03032 Cellulitis of left toe: Secondary | ICD-10-CM

## 2014-06-05 MED ORDER — CEPHALEXIN 500 MG PO CAPS: 500.0000 mg | ORAL_CAPSULE | Freq: Three times a day (TID) | ORAL | Status: DC

## 2014-06-05 NOTE — Patient Instructions (Signed)

## 2014-06-06 NOTE — Progress Notes (Signed)
Subjective:     Patient ID: Paul Ray, male   DOB: 07-05-1956, 58 y.o.   MRN: [id number]  HPIpatient presents stating my left big toe on this one side has been red with some drainage and I think I have an infection in that area.patient states it started last few days   Review of Systems     Objective:   Physical Exam Neurovascular status intact with muscle strength adequate range of motion of the subtalar and midtarsal joint within normal limits. Patient is noted to have an erythematous left hallux medial side with pain when pressed and also has chronic incurvation of his nailbeds of both feet right and left foot    Assessment:     Acute localized paronychia infection left hallux with chronic ingrown toenail deformity    Plan:     Reviewed H&P and discussed to [facility] correction to get this better. Today I infiltrated 60 mg Xylocaine Marcaine mixture remove the medial border noted some localized pus which I cleaned and aspirated out with no indications of other issues and applied sterile dressing and instructed on soaks. Reappoint 2 weeks for consideration of permanent procedure

## 2014-06-07 ENCOUNTER — Telehealth: Payer: Self-pay

## 2014-06-07 NOTE — Telephone Encounter (Signed)
Returned patient phone call, left message on his home phone

## 2015-08-22 MED FILL — AMPHETAMINE SALTS 20 MG TAB: 20 | 90 days supply | Qty: 90 | Fill #0

## 2015-09-05 MED FILL — OXYMORPHONE HCL ER 15 MG TA: 15 | 90 days supply | Qty: 180 | Fill #0

## 2015-09-07 MED FILL — HYDROmorphone HCL 4 MG TABS: 4 | 90 days supply | Qty: 270 | Fill #0

## 2015-09-12 MED FILL — TAMSULOSIN HCL 0.4 MG CAP: 0.4 | 90 days supply | Qty: 90 | Fill #1

## 2015-09-28 MED FILL — ALPRAZolam 2 MG TABS: 2 | 90 days supply | Qty: 180 | Fill #0

## 2015-11-20 MED FILL — DEXTROAMP-AMPHETAMIN 20 MG: 20 | 90 days supply | Qty: 90 | Fill #0

## 2015-11-20 MED FILL — ADDERALL XR 30 MG CAP SA: 30 | 90 days supply | Qty: 180 | Fill #0

## 2015-11-26 DIAGNOSIS — M961 Postlaminectomy syndrome, not elsewhere classified: Secondary | ICD-10-CM | POA: Diagnosis not present

## 2015-11-26 DIAGNOSIS — M545 Low back pain: Secondary | ICD-10-CM | POA: Diagnosis not present

## 2015-11-26 MED FILL — MORPHINE SULF ER 30 MG TAB: 30 | 90 days supply | Qty: 180 | Fill #0

## 2015-12-10 DIAGNOSIS — L02811 Cutaneous abscess of head [any part, except face]: Secondary | ICD-10-CM | POA: Diagnosis not present

## 2015-12-10 MED FILL — AMOX-CLAV 875-125 MG TABLET: 875-125 | 10 days supply | Qty: 20 | Fill #0

## 2015-12-25 MED FILL — lamoTRIgine 200 MG TABS: 200 | 90 days supply | Qty: 90 | Fill #0

## 2015-12-26 MED FILL — TAMSULOSIN HCL 0.4 MG CAP: 0.4 | 90 days supply | Qty: 90 | Fill #2

## 2016-01-07 DIAGNOSIS — F319 Bipolar disorder, unspecified: Secondary | ICD-10-CM | POA: Diagnosis not present

## 2016-01-07 DIAGNOSIS — F900 Attention-deficit hyperactivity disorder, predominantly inattentive type: Secondary | ICD-10-CM | POA: Diagnosis not present

## 2016-01-07 DIAGNOSIS — F6381 Intermittent explosive disorder: Secondary | ICD-10-CM | POA: Diagnosis not present

## 2016-02-04 DIAGNOSIS — N401 Enlarged prostate with lower urinary tract symptoms: Secondary | ICD-10-CM | POA: Diagnosis not present

## 2016-02-04 DIAGNOSIS — Z125 Encounter for screening for malignant neoplasm of prostate: Secondary | ICD-10-CM | POA: Diagnosis not present

## 2016-02-04 DIAGNOSIS — Z0000 Encounter for general adult medical examination without abnormal findings: Secondary | ICD-10-CM | POA: Diagnosis not present

## 2016-02-04 DIAGNOSIS — F3175 Bipolar disorder, in partial remission, most recent episode depressed: Secondary | ICD-10-CM | POA: Diagnosis not present

## 2016-02-04 DIAGNOSIS — M7021 Olecranon bursitis, right elbow: Secondary | ICD-10-CM | POA: Diagnosis not present

## 2016-02-04 DIAGNOSIS — Z1211 Encounter for screening for malignant neoplasm of colon: Secondary | ICD-10-CM | POA: Diagnosis not present

## 2016-02-04 DIAGNOSIS — M545 Low back pain: Secondary | ICD-10-CM | POA: Diagnosis not present

## 2016-02-04 MED FILL — MELOXICAM 15 MG TABLET: 15 | 14 days supply | Qty: 14 | Fill #0

## 2016-02-18 MED FILL — DEXTROAMP-AMPHETAMIN 20 MG: 20 | 90 days supply | Qty: 90 | Fill #0

## 2016-02-25 DIAGNOSIS — M545 Low back pain: Secondary | ICD-10-CM | POA: Diagnosis not present

## 2016-02-25 DIAGNOSIS — M961 Postlaminectomy syndrome, not elsewhere classified: Secondary | ICD-10-CM | POA: Diagnosis not present

## 2016-02-25 MED FILL — MORPHINE SULF ER 30 MG TAB: 30 | 90 days supply | Qty: 180 | Fill #0

## 2016-02-27 MED FILL — HYDROmorphone HCL 4 MG TABS: 4 | 90 days supply | Qty: 270 | Fill #0

## 2016-04-18 MED FILL — TAMSULOSIN HCL 0.4 MG CAP: 0.4 | 90 days supply | Qty: 90 | Fill #0

## 2016-04-23 DIAGNOSIS — M47816 Spondylosis without myelopathy or radiculopathy, lumbar region: Secondary | ICD-10-CM | POA: Diagnosis not present

## 2016-04-23 DIAGNOSIS — Z9889 Other specified postprocedural states: Secondary | ICD-10-CM | POA: Diagnosis not present

## 2016-04-23 DIAGNOSIS — G8929 Other chronic pain: Secondary | ICD-10-CM | POA: Diagnosis not present

## 2016-04-23 DIAGNOSIS — Z79899 Other long term (current) drug therapy: Secondary | ICD-10-CM | POA: Diagnosis not present

## 2016-04-25 MED FILL — lamoTRIgine 200 MG TABS: 200 | 90 days supply | Qty: 90 | Fill #0

## 2016-05-07 ENCOUNTER — Telehealth: Payer: Self-pay | Admitting: Family Medicine

## 2016-05-15 DIAGNOSIS — Z79899 Other long term (current) drug therapy: Secondary | ICD-10-CM | POA: Diagnosis not present

## 2016-05-15 DIAGNOSIS — Z9889 Other specified postprocedural states: Secondary | ICD-10-CM | POA: Diagnosis not present

## 2016-05-15 DIAGNOSIS — G8929 Other chronic pain: Secondary | ICD-10-CM | POA: Diagnosis not present

## 2016-05-15 DIAGNOSIS — M47816 Spondylosis without myelopathy or radiculopathy, lumbar region: Secondary | ICD-10-CM | POA: Diagnosis not present

## 2016-05-15 MED FILL — ADDERALL XR 30 MG CAP SA: 30 | 90 days supply | Qty: 180 | Fill #0

## 2016-05-16 MED FILL — DEXTROAMP-AMPHETAMIN 20 MG: 20 | 90 days supply | Qty: 90 | Fill #0

## 2016-05-23 MED FILL — MORPHINE SULF 60 MG TAB SA: 60 | 30 days supply | Qty: 90 | Fill #0

## 2016-07-09 DIAGNOSIS — F6381 Intermittent explosive disorder: Secondary | ICD-10-CM | POA: Diagnosis not present

## 2016-07-09 DIAGNOSIS — F900 Attention-deficit hyperactivity disorder, predominantly inattentive type: Secondary | ICD-10-CM | POA: Diagnosis not present

## 2016-07-09 MED FILL — CloNIDine HCL 0.1 MG TAB: 0.1 | 30 days supply | Qty: 120 | Fill #0

## 2016-07-10 DIAGNOSIS — M545 Low back pain: Secondary | ICD-10-CM | POA: Diagnosis not present

## 2016-07-18 NOTE — Telephone Encounter (Signed)
Closing encounter

## 2016-08-01 MED FILL — TAMSULOSIN HCL 0.4 MG CAP: 0.4 | 90 days supply | Qty: 180 | Fill #0

## 2016-08-14 MED FILL — HYDROmorphone HCL 4 MG TABS: 4 | 30 days supply | Qty: 90 | Fill #0

## 2016-08-14 MED FILL — MORPHINE SULF ER 30 MG TAB: 30 | 30 days supply | Qty: 60 | Fill #0

## 2016-08-28 ENCOUNTER — Telehealth (INDEPENDENT_AMBULATORY_CARE_PROVIDER_SITE_OTHER): Payer: Self-pay | Admitting: Orthopaedic Surgery

## 2016-08-28 NOTE — Telephone Encounter (Signed)
Pt requested a call back regarding his right hip, he asked to speak to Dr. Ninfa Linden himself. He said he has been dealing with him a long time.  [phone]

## 2016-08-28 NOTE — Telephone Encounter (Signed)
I spoke to him.  He needs an appointment to see me in the next 2 weeks.

## 2016-08-28 NOTE — Telephone Encounter (Signed)
Can you do me a favor and call him and make an appt NEXT available, no rush

## 2016-08-28 NOTE — Telephone Encounter (Signed)
Called and left voicemail advising patient to return my call concerning an appointment for Dr. Ninfa Linden in 2 weeks.  I did advise patient that we have some openings for Tuesday  09/10/16. Please schedule patient when he returns call. Thank You

## 2016-09-09 ENCOUNTER — Ambulatory Visit (INDEPENDENT_AMBULATORY_CARE_PROVIDER_SITE_OTHER): Payer: Self-pay

## 2016-09-09 ENCOUNTER — Ambulatory Visit (INDEPENDENT_AMBULATORY_CARE_PROVIDER_SITE_OTHER): Payer: BLUE CROSS/BLUE SHIELD | Admitting: Orthopaedic Surgery

## 2016-09-09 DIAGNOSIS — M25551 Pain in right hip: Secondary | ICD-10-CM

## 2016-09-09 NOTE — Progress Notes (Signed)
Office Visit Note   Patient: Paul Ray           Date of Birth: 06-09-1956           MRN: [id number] Visit Date: 09/09/2016              Requested by: Shirline Frees, MD [address], [place] [id number] PCP: Shirline Frees, MD   Assessment & Plan: Visit Diagnoses:  1. Pain in right hip     Plan: We showed him a ray of trochanteric stretching exercises and have recommended home program. I like see him back in a month to see how is responded to this. If he is not improving I will likely consider an MRI  Follow-Up Instructions: Return in about 4 weeks (around 10/07/2016).   Orders:  Orders Placed This Encounter  Procedures  . XR HIP UNILAT W OR W/O PELVIS 1V RIGHT   No orders of the defined types were placed in this encounter.     Procedures: No procedures performed   Clinical Data: No additional findings.   Subjective: No chief complaint on file. 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 He does have a history of a resurfacing arthroplasty of his left hip that was done years ago. His right hip hurts mainly over the trochanteric area and some up higher and he denies any groin pain  HPI  Review of Systems He denies any recent illnesses and denies any fever chills  Objective: Vital Signs: There were no vitals taken for this visit.  Physical Exam His alert and oriented 3 in no acute distress Ortho Exam Examination of his right hip does show some pain on extremes of rotation send the groin but he also has pain of the trochanteric area of the hip as well. Specialty Comments:  No specialty comments available.  Imaging: Xr Hip Unilat W Or W/o Pelvis 1v Right  Result Date: 09/09/2016 An AP pelvis and a lateral of his right hip shows just some slight joint space narrowing. The femoral head is well located still nice and concentric.    PMFS History: Patient Active Problem  List   Diagnosis Date Noted  . Impingement syndrome of right shoulder 07/05/2013  . Empyema lung ([place])   . Abscess of lung(513.0)   . Bipolar depression ([place])   . ADD (attention deficit disorder)   . Hepatitis C   . Lung mass 10/22/2010  . Pleural effusion 10/15/2010   Past Medical History:  Diagnosis Date  . Abscess of lung(513.0)    Right lower lobe  . ADD (attention deficit disorder)   . Anxiety   . Arthritis   . Bipolar depression   . Empyema lung   . Hemorrhoids   . Hepatitis C   . Pneumonia    last year  . Prostate abscess   . Prostate troubles     Family History  Problem Relation Age of Onset  . COPD Mother   . Heart disease Mother   . Hypertension Mother   . Coronary artery disease Father   . Dementia Father   . Heart failure Father   . Prostate cancer Paternal Grandfather     also had bone cancer  . Breast cancer Maternal Grandmother   . Cancer Paternal Grandmother     unsure what kind    Past Surgical History:  Procedure Laterality Date  . ELBOW  SURGERY  2005   Right  . FOOT NEUROMA SURGERY  2006   Left  . HIP RESURFACING     [facility] hip [facility]/2011  . LUMBAR LAMINECTOMY/DECOMPRESSION MICRODISCECTOMY  07/24/2011   Procedure: LUMBAR LAMINECTOMY/DECOMPRESSION MICRODISCECTOMY;  Surgeon: Eustace Moore;  Location: [facility];  Service: Neurosurgery;  Laterality: Bilateral;  Bilateral  , Lumbar Three-Four, Lumbar Four-Five Decompressive Laminectomy Rm # [phone]  . LUNG SURGERY     for empyema  . SHOULDER ARTHROSCOPY Right 07/05/2013   Procedure: RIGHT SHOULDER ARTHROSCOPY WITH DEBRIDEMENT;  Surgeon: Mcarthur Rossetti, MD;  Location: [facility];  Service: Orthopedics;  Laterality: Right;  . TENDON TRANSFER Left 01/26/2014   Procedure: LEFT THUMB TRAPEZIECTOMY KNOTTED TENDON INTRAPOSITION TRANSFER OF ABDUCTOR POLLICIS LONGUS TO [place];  Surgeon: Cammie Sickle, MD;  Location: [facility];  Service: Orthopedics;  Laterality: Left;  .  THORACOTOMY  November 14 2010   rt   Social History   Occupational History  . carpenter    Social History Main Topics  . Smoking status: Never Smoker  . Smokeless tobacco: Never Used  . Alcohol use No     Comment: quit drinking in 1989  . Drug use: No  . Sexual activity: Not on file

## 2016-09-11 MED FILL — HYDROmorphone HCL 4 MG TABS: 4 | 30 days supply | Qty: 90 | Fill #0

## 2016-09-11 MED FILL — MORPHINE SULF ER 30 MG TAB: 30 | 30 days supply | Qty: 60 | Fill #0

## 2016-09-12 ENCOUNTER — Telehealth (INDEPENDENT_AMBULATORY_CARE_PROVIDER_SITE_OTHER): Payer: Self-pay | Admitting: Orthopaedic Surgery

## 2016-09-12 NOTE — Telephone Encounter (Signed)
Patient called and said that the stretches that he was advised to do is really painful and he was wondering if there was another stretch he could do that would be easier on him and less painful. CB # [phone]

## 2016-09-15 DIAGNOSIS — G894 Chronic pain syndrome: Secondary | ICD-10-CM | POA: Diagnosis not present

## 2016-09-15 DIAGNOSIS — M545 Low back pain: Secondary | ICD-10-CM | POA: Diagnosis not present

## 2016-09-15 DIAGNOSIS — M25511 Pain in right shoulder: Secondary | ICD-10-CM | POA: Diagnosis not present

## 2016-09-15 DIAGNOSIS — M25551 Pain in right hip: Secondary | ICD-10-CM | POA: Diagnosis not present

## 2016-09-15 NOTE — Telephone Encounter (Signed)
Tell him to look on-line for various stretches.  Can look under [employer] and type in "trochanteric bursitis stretches" or "exercises"

## 2016-09-15 NOTE — Telephone Encounter (Signed)
Please advise 

## 2016-09-15 NOTE — Telephone Encounter (Signed)
LMOM for patient of the below message from CB 

## 2016-10-07 ENCOUNTER — Ambulatory Visit (INDEPENDENT_AMBULATORY_CARE_PROVIDER_SITE_OTHER): Payer: BLUE CROSS/BLUE SHIELD | Admitting: Orthopaedic Surgery

## 2016-10-08 ENCOUNTER — Ambulatory Visit (INDEPENDENT_AMBULATORY_CARE_PROVIDER_SITE_OTHER): Payer: BLUE CROSS/BLUE SHIELD | Admitting: Orthopaedic Surgery

## 2016-10-08 ENCOUNTER — Encounter (INDEPENDENT_AMBULATORY_CARE_PROVIDER_SITE_OTHER): Payer: Self-pay | Admitting: Orthopaedic Surgery

## 2016-10-08 DIAGNOSIS — M25551 Pain in right hip: Secondary | ICD-10-CM | POA: Diagnosis not present

## 2016-10-08 DIAGNOSIS — G894 Chronic pain syndrome: Secondary | ICD-10-CM | POA: Diagnosis not present

## 2016-10-08 DIAGNOSIS — M25511 Pain in right shoulder: Secondary | ICD-10-CM | POA: Diagnosis not present

## 2016-10-08 DIAGNOSIS — M545 Low back pain: Secondary | ICD-10-CM | POA: Diagnosis not present

## 2016-10-08 MED FILL — HYDROmorphone HCL 4 MG TABS: 4 | 30 days supply | Qty: 90 | Fill #0

## 2016-10-08 MED FILL — MORPHINE SULF ER 30 MG TAB: 30 | 30 days supply | Qty: 90 | Fill #0

## 2016-10-08 NOTE — Addendum Note (Signed)
Addended by: Maxcine Ham on: 10/08/2016 11:09 AM   Modules accepted: Orders

## 2016-10-08 NOTE — Progress Notes (Signed)
The patient is well-known to me. He comes in with worsening right hip pain. I've tried all forms of conservative treatment to get this to calm down. He is someone who actually had a resurfacing arthroplasty of his left hip done at [facility] years ago. 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 His pain is mainly in the groin. I've obtained x-rays of his hip that shows femoral acetabular impingement.  On exam he has significant pain with interaction rotation of his right hip. There is also pain of the trochanteric area.  At this point we definitely need to obtain an MRI to truly assess the extent of cartilage damage in his hip to make the right determination of whether or not the hip replacement is warranted at this point given the failure of all forms of conservative treatment. His pain is definitely worsening. He is walking with a limp. We'll see him back after the MRI.

## 2016-10-09 ENCOUNTER — Telehealth (INDEPENDENT_AMBULATORY_CARE_PROVIDER_SITE_OTHER): Payer: Self-pay | Admitting: *Deleted

## 2016-10-09 NOTE — Telephone Encounter (Signed)
Pt has a n appointment for mri on Friday march 23rd at 3pm please get there at 2:45. lmtrc to patient for appointment info, pt needs follow up appointmnet

## 2016-10-10 ENCOUNTER — Telehealth (INDEPENDENT_AMBULATORY_CARE_PROVIDER_SITE_OTHER): Payer: Self-pay | Admitting: *Deleted

## 2016-10-10 NOTE — Telephone Encounter (Signed)
Pt has appt for MRI on Friday March 23 and would like for Dr. Ninfa Linden to call him with the results instead of him making another appt and pay copay.   Pt call back [phone]

## 2016-10-10 NOTE — Telephone Encounter (Signed)
See below

## 2016-10-10 NOTE — Telephone Encounter (Signed)
I called back advising him of his MRI appt at [facility] and pt stated he was told that he would be scheduled with [place] [facility] when in office and asked why he was going to [facility], I informed the pt that his insurance approved him to go to [facility] for a cheaper cost and he seemed shocked, but states he would go, pt also stated that he did not want to come back for a review of the MRI but wants Dr. Ninfa Linden to call him iwht the results, I informed the pt that in his ov notes doctor wants to see him back to discuss, pt stated he knew Dr. Ninfa Linden well and that he has done it before and that's what he is wanting him to do. I told him I would leave a message with Dr. Ninfa Linden letting him know. Pt got upset with me because his appt was not the time that he wanted and wanted me to call to reschedule and I politely informed him that I would give him the number to contact imaging to reschedule and that I only make the initial call and there was a discrepenacy with the appt then he would have to call and reschedule. Pt asked for my name it was given to him. Pt was not happy with any of the answers that was given.

## 2016-10-14 ENCOUNTER — Telehealth (INDEPENDENT_AMBULATORY_CARE_PROVIDER_SITE_OTHER): Payer: Self-pay | Admitting: Orthopaedic Surgery

## 2016-10-14 NOTE — Telephone Encounter (Signed)
Patient called stating that he is having really bad hip pain, so bad that he could not sleep last night.  Dr. Ninfa Linden had talked about doing a total hip replacement.  He wants to know how Dr Ninfa Linden feel about bone resurfacing.  CB#[phone] or his cell [phone].  Thank you

## 2016-10-14 NOTE — Telephone Encounter (Signed)
Patient aware of the below message  

## 2016-10-14 NOTE — Telephone Encounter (Signed)
Please advise 

## 2016-10-14 NOTE — Telephone Encounter (Signed)
Tell him I really need to see what his hip MRI shows.  Also, I don't do hip resurfacing at all.

## 2016-10-17 ENCOUNTER — Ambulatory Visit (HOSPITAL_COMMUNITY): Payer: 59

## 2016-10-21 ENCOUNTER — Telehealth (INDEPENDENT_AMBULATORY_CARE_PROVIDER_SITE_OTHER): Payer: Self-pay | Admitting: Radiology

## 2016-10-21 ENCOUNTER — Ambulatory Visit (HOSPITAL_COMMUNITY): Admission: RE | Admit: 2016-10-21 | Payer: BLUE CROSS/BLUE SHIELD | Source: Ambulatory Visit

## 2016-10-21 NOTE — Telephone Encounter (Signed)
Patient called requesting copies of right hip x-rays. CD has been made and patient will be picking up CD.

## 2016-10-24 DIAGNOSIS — M25551 Pain in right hip: Secondary | ICD-10-CM | POA: Diagnosis not present

## 2016-10-28 MED FILL — ALPRAZolam 2 MG TABS: 2 | 90 days supply | Qty: 180 | Fill #0

## 2016-11-03 DIAGNOSIS — M1611 Unilateral primary osteoarthritis, right hip: Secondary | ICD-10-CM | POA: Diagnosis not present

## 2016-11-04 DIAGNOSIS — G894 Chronic pain syndrome: Secondary | ICD-10-CM | POA: Diagnosis not present

## 2016-11-04 DIAGNOSIS — M545 Low back pain: Secondary | ICD-10-CM | POA: Diagnosis not present

## 2016-11-04 DIAGNOSIS — M25551 Pain in right hip: Secondary | ICD-10-CM | POA: Diagnosis not present

## 2016-11-04 DIAGNOSIS — M25511 Pain in right shoulder: Secondary | ICD-10-CM | POA: Diagnosis not present

## 2016-11-04 MED FILL — MORPHINE SULF 60 MG TAB SA: 60 | 30 days supply | Qty: 60 | Fill #0

## 2016-11-05 MED FILL — HYDROmorphone HCL 4 MG TABS: 4 | 30 days supply | Qty: 90 | Fill #0

## 2016-11-12 ENCOUNTER — Telehealth (INDEPENDENT_AMBULATORY_CARE_PROVIDER_SITE_OTHER): Payer: Self-pay | Admitting: *Deleted

## 2016-11-12 NOTE — Telephone Encounter (Signed)
See below

## 2016-11-12 NOTE — Telephone Encounter (Signed)
Pt called stating he has Dr. Trevor Mace cell phone number but it isn't working. I advised pt I would leave a message for him to call back. Pt is having hip resurfacing done by Dr. Sallyanne Havers in [employer] on May 8th and wanted Dr. Ninfa Linden to know this. Pt also thinks his toe is broken.

## 2016-11-13 MED FILL — TAMSULOSIN HCL 0.4 MG CAP: 0.4 | 90 days supply | Qty: 180 | Fill #0

## 2016-11-18 DIAGNOSIS — Z01818 Encounter for other preprocedural examination: Secondary | ICD-10-CM | POA: Diagnosis not present

## 2016-11-18 DIAGNOSIS — M1611 Unilateral primary osteoarthritis, right hip: Secondary | ICD-10-CM | POA: Diagnosis not present

## 2016-11-20 DIAGNOSIS — Z01818 Encounter for other preprocedural examination: Secondary | ICD-10-CM | POA: Diagnosis not present

## 2016-12-01 MED FILL — HYDROmorphone HCL 4 MG TABS: 4 | 30 days supply | Qty: 180 | Fill #0

## 2016-12-01 MED FILL — MORPHINE SULF 60 MG TAB SA: 60 | 30 days supply | Qty: 60 | Fill #0

## 2016-12-02 DIAGNOSIS — F909 Attention-deficit hyperactivity disorder, unspecified type: Secondary | ICD-10-CM | POA: Diagnosis not present

## 2016-12-02 DIAGNOSIS — F319 Bipolar disorder, unspecified: Secondary | ICD-10-CM | POA: Diagnosis not present

## 2016-12-02 DIAGNOSIS — D62 Acute posthemorrhagic anemia: Secondary | ICD-10-CM | POA: Diagnosis not present

## 2016-12-02 DIAGNOSIS — N401 Enlarged prostate with lower urinary tract symptoms: Secondary | ICD-10-CM | POA: Diagnosis not present

## 2016-12-02 DIAGNOSIS — K5900 Constipation, unspecified: Secondary | ICD-10-CM | POA: Diagnosis not present

## 2016-12-02 DIAGNOSIS — N509 Disorder of male genital organs, unspecified: Secondary | ICD-10-CM | POA: Diagnosis not present

## 2016-12-02 DIAGNOSIS — N3943 Post-void dribbling: Secondary | ICD-10-CM | POA: Diagnosis not present

## 2016-12-02 DIAGNOSIS — M2550 Pain in unspecified joint: Secondary | ICD-10-CM | POA: Diagnosis not present

## 2016-12-02 DIAGNOSIS — G8929 Other chronic pain: Secondary | ICD-10-CM | POA: Insufficient documentation

## 2016-12-02 DIAGNOSIS — D649 Anemia, unspecified: Secondary | ICD-10-CM | POA: Diagnosis not present

## 2016-12-02 DIAGNOSIS — R1030 Lower abdominal pain, unspecified: Secondary | ICD-10-CM | POA: Diagnosis not present

## 2016-12-02 DIAGNOSIS — G8918 Other acute postprocedural pain: Secondary | ICD-10-CM | POA: Diagnosis not present

## 2016-12-02 DIAGNOSIS — R399 Unspecified symptoms and signs involving the genitourinary system: Secondary | ICD-10-CM | POA: Diagnosis not present

## 2016-12-02 DIAGNOSIS — Z79899 Other long term (current) drug therapy: Secondary | ICD-10-CM | POA: Diagnosis not present

## 2016-12-02 DIAGNOSIS — M1611 Unilateral primary osteoarthritis, right hip: Secondary | ICD-10-CM | POA: Diagnosis not present

## 2016-12-02 DIAGNOSIS — N5089 Other specified disorders of the male genital organs: Secondary | ICD-10-CM | POA: Diagnosis not present

## 2016-12-02 DIAGNOSIS — Z7401 Bed confinement status: Secondary | ICD-10-CM | POA: Diagnosis not present

## 2016-12-02 DIAGNOSIS — Z418 Encounter for other procedures for purposes other than remedying health state: Secondary | ICD-10-CM | POA: Diagnosis not present

## 2016-12-03 DIAGNOSIS — M1611 Unilateral primary osteoarthritis, right hip: Secondary | ICD-10-CM | POA: Diagnosis not present

## 2016-12-03 DIAGNOSIS — Z418 Encounter for other procedures for purposes other than remedying health state: Secondary | ICD-10-CM | POA: Diagnosis not present

## 2016-12-03 DIAGNOSIS — D62 Acute posthemorrhagic anemia: Secondary | ICD-10-CM | POA: Diagnosis not present

## 2016-12-03 DIAGNOSIS — N5089 Other specified disorders of the male genital organs: Secondary | ICD-10-CM | POA: Diagnosis not present

## 2016-12-03 DIAGNOSIS — R1030 Lower abdominal pain, unspecified: Secondary | ICD-10-CM | POA: Diagnosis not present

## 2016-12-03 DIAGNOSIS — Z7401 Bed confinement status: Secondary | ICD-10-CM | POA: Diagnosis not present

## 2016-12-03 DIAGNOSIS — M2550 Pain in unspecified joint: Secondary | ICD-10-CM | POA: Diagnosis not present

## 2016-12-04 DIAGNOSIS — R1030 Lower abdominal pain, unspecified: Secondary | ICD-10-CM | POA: Diagnosis not present

## 2016-12-04 DIAGNOSIS — M1611 Unilateral primary osteoarthritis, right hip: Secondary | ICD-10-CM | POA: Diagnosis not present

## 2016-12-04 DIAGNOSIS — D62 Acute posthemorrhagic anemia: Secondary | ICD-10-CM | POA: Diagnosis not present

## 2016-12-04 DIAGNOSIS — N5089 Other specified disorders of the male genital organs: Secondary | ICD-10-CM | POA: Diagnosis not present

## 2016-12-05 DIAGNOSIS — M1611 Unilateral primary osteoarthritis, right hip: Secondary | ICD-10-CM | POA: Diagnosis not present

## 2016-12-05 DIAGNOSIS — N5089 Other specified disorders of the male genital organs: Secondary | ICD-10-CM | POA: Diagnosis not present

## 2016-12-05 DIAGNOSIS — D62 Acute posthemorrhagic anemia: Secondary | ICD-10-CM | POA: Diagnosis not present

## 2016-12-05 DIAGNOSIS — R1030 Lower abdominal pain, unspecified: Secondary | ICD-10-CM | POA: Diagnosis not present

## 2016-12-06 DIAGNOSIS — R1030 Lower abdominal pain, unspecified: Secondary | ICD-10-CM | POA: Diagnosis not present

## 2016-12-06 DIAGNOSIS — N5089 Other specified disorders of the male genital organs: Secondary | ICD-10-CM | POA: Diagnosis not present

## 2016-12-06 DIAGNOSIS — D62 Acute posthemorrhagic anemia: Secondary | ICD-10-CM | POA: Diagnosis not present

## 2016-12-06 DIAGNOSIS — M1611 Unilateral primary osteoarthritis, right hip: Secondary | ICD-10-CM | POA: Diagnosis not present

## 2016-12-07 DIAGNOSIS — N5089 Other specified disorders of the male genital organs: Secondary | ICD-10-CM | POA: Diagnosis not present

## 2016-12-07 DIAGNOSIS — R1030 Lower abdominal pain, unspecified: Secondary | ICD-10-CM | POA: Diagnosis not present

## 2016-12-07 DIAGNOSIS — D62 Acute posthemorrhagic anemia: Secondary | ICD-10-CM | POA: Diagnosis not present

## 2016-12-07 DIAGNOSIS — M1611 Unilateral primary osteoarthritis, right hip: Secondary | ICD-10-CM | POA: Diagnosis not present

## 2016-12-08 DIAGNOSIS — M1611 Unilateral primary osteoarthritis, right hip: Secondary | ICD-10-CM | POA: Diagnosis not present

## 2016-12-08 DIAGNOSIS — N5089 Other specified disorders of the male genital organs: Secondary | ICD-10-CM | POA: Diagnosis not present

## 2016-12-08 DIAGNOSIS — D62 Acute posthemorrhagic anemia: Secondary | ICD-10-CM | POA: Diagnosis not present

## 2016-12-08 DIAGNOSIS — R1030 Lower abdominal pain, unspecified: Secondary | ICD-10-CM | POA: Diagnosis not present

## 2016-12-09 DIAGNOSIS — N509 Disorder of male genital organs, unspecified: Secondary | ICD-10-CM | POA: Diagnosis not present

## 2016-12-09 DIAGNOSIS — R399 Unspecified symptoms and signs involving the genitourinary system: Secondary | ICD-10-CM | POA: Diagnosis not present

## 2016-12-09 DIAGNOSIS — D62 Acute posthemorrhagic anemia: Secondary | ICD-10-CM | POA: Diagnosis not present

## 2016-12-09 DIAGNOSIS — R1030 Lower abdominal pain, unspecified: Secondary | ICD-10-CM | POA: Diagnosis not present

## 2016-12-09 DIAGNOSIS — D649 Anemia, unspecified: Secondary | ICD-10-CM | POA: Diagnosis not present

## 2016-12-09 DIAGNOSIS — M1611 Unilateral primary osteoarthritis, right hip: Secondary | ICD-10-CM | POA: Diagnosis not present

## 2016-12-09 DIAGNOSIS — N5089 Other specified disorders of the male genital organs: Secondary | ICD-10-CM | POA: Diagnosis not present

## 2016-12-10 DIAGNOSIS — M1611 Unilateral primary osteoarthritis, right hip: Secondary | ICD-10-CM | POA: Diagnosis not present

## 2016-12-10 DIAGNOSIS — R1030 Lower abdominal pain, unspecified: Secondary | ICD-10-CM | POA: Diagnosis not present

## 2016-12-10 DIAGNOSIS — D649 Anemia, unspecified: Secondary | ICD-10-CM | POA: Diagnosis not present

## 2016-12-10 DIAGNOSIS — D62 Acute posthemorrhagic anemia: Secondary | ICD-10-CM | POA: Diagnosis not present

## 2016-12-10 DIAGNOSIS — N509 Disorder of male genital organs, unspecified: Secondary | ICD-10-CM | POA: Diagnosis not present

## 2016-12-10 DIAGNOSIS — G8918 Other acute postprocedural pain: Secondary | ICD-10-CM | POA: Diagnosis not present

## 2016-12-10 DIAGNOSIS — N5089 Other specified disorders of the male genital organs: Secondary | ICD-10-CM | POA: Diagnosis not present

## 2016-12-10 DIAGNOSIS — Z79899 Other long term (current) drug therapy: Secondary | ICD-10-CM | POA: Diagnosis not present

## 2016-12-11 DIAGNOSIS — M1611 Unilateral primary osteoarthritis, right hip: Secondary | ICD-10-CM | POA: Diagnosis not present

## 2016-12-11 DIAGNOSIS — D62 Acute posthemorrhagic anemia: Secondary | ICD-10-CM | POA: Diagnosis not present

## 2016-12-11 DIAGNOSIS — G8918 Other acute postprocedural pain: Secondary | ICD-10-CM | POA: Diagnosis not present

## 2016-12-11 DIAGNOSIS — R1030 Lower abdominal pain, unspecified: Secondary | ICD-10-CM | POA: Diagnosis not present

## 2016-12-11 DIAGNOSIS — D649 Anemia, unspecified: Secondary | ICD-10-CM | POA: Diagnosis not present

## 2016-12-11 DIAGNOSIS — N5089 Other specified disorders of the male genital organs: Secondary | ICD-10-CM | POA: Diagnosis not present

## 2016-12-11 DIAGNOSIS — Z79899 Other long term (current) drug therapy: Secondary | ICD-10-CM | POA: Diagnosis not present

## 2016-12-11 DIAGNOSIS — N509 Disorder of male genital organs, unspecified: Secondary | ICD-10-CM | POA: Diagnosis not present

## 2016-12-12 DIAGNOSIS — N5089 Other specified disorders of the male genital organs: Secondary | ICD-10-CM | POA: Diagnosis not present

## 2016-12-12 DIAGNOSIS — N509 Disorder of male genital organs, unspecified: Secondary | ICD-10-CM | POA: Diagnosis not present

## 2016-12-12 DIAGNOSIS — R1030 Lower abdominal pain, unspecified: Secondary | ICD-10-CM | POA: Diagnosis not present

## 2016-12-12 DIAGNOSIS — D649 Anemia, unspecified: Secondary | ICD-10-CM | POA: Diagnosis not present

## 2016-12-12 DIAGNOSIS — M1611 Unilateral primary osteoarthritis, right hip: Secondary | ICD-10-CM | POA: Diagnosis not present

## 2016-12-12 DIAGNOSIS — Z79899 Other long term (current) drug therapy: Secondary | ICD-10-CM | POA: Diagnosis not present

## 2016-12-12 DIAGNOSIS — G8918 Other acute postprocedural pain: Secondary | ICD-10-CM | POA: Diagnosis not present

## 2016-12-12 DIAGNOSIS — D62 Acute posthemorrhagic anemia: Secondary | ICD-10-CM | POA: Diagnosis not present

## 2016-12-16 DIAGNOSIS — G894 Chronic pain syndrome: Secondary | ICD-10-CM | POA: Diagnosis not present

## 2016-12-22 ENCOUNTER — Emergency Department (HOSPITAL_COMMUNITY)
Admission: EM | Admit: 2016-12-22 | Discharge: 2016-12-22 | Disposition: A | Discharge status: Home / Self Care | Payer: BLUE CROSS/BLUE SHIELD | Attending: Emergency Medicine | Admitting: Emergency Medicine

## 2016-12-22 ENCOUNTER — Emergency Department (HOSPITAL_BASED_OUTPATIENT_CLINIC_OR_DEPARTMENT_OTHER)
Admit: 2016-12-22 | Discharge: 2016-12-22 | Disposition: A | Discharge status: Home / Self Care | Payer: BLUE CROSS/BLUE SHIELD | Attending: Emergency Medicine | Admitting: Emergency Medicine

## 2016-12-22 ENCOUNTER — Emergency Department (HOSPITAL_COMMUNITY): Payer: BLUE CROSS/BLUE SHIELD

## 2016-12-22 ENCOUNTER — Encounter (HOSPITAL_COMMUNITY): Payer: Self-pay | Admitting: *Deleted

## 2016-12-22 DIAGNOSIS — M7989 Other specified soft tissue disorders: Secondary | ICD-10-CM | POA: Insufficient documentation

## 2016-12-22 DIAGNOSIS — M25551 Pain in right hip: Secondary | ICD-10-CM | POA: Diagnosis not present

## 2016-12-22 DIAGNOSIS — Z79899 Other long term (current) drug therapy: Secondary | ICD-10-CM | POA: Insufficient documentation

## 2016-12-22 DIAGNOSIS — M79609 Pain in unspecified limb: Secondary | ICD-10-CM | POA: Diagnosis not present

## 2016-12-22 LAB — COMPREHENSIVE METABOLIC PANEL
ALT: 13 U/L — ABNORMAL LOW (ref 17–63)
AST: 28 U/L (ref 15–41)
Albumin: 3 g/dL — ABNORMAL LOW (ref 3.5–5.0)
Alkaline Phosphatase: 90 U/L (ref 38–126)
Anion gap: 9 (ref 5–15)
BUN: 19 mg/dL (ref 6–20)
CO2: 29 mmol/L (ref 22–32)
Calcium: 8.5 mg/dL — ABNORMAL LOW (ref 8.9–10.3)
Chloride: 99 mmol/L — ABNORMAL LOW (ref 101–111)
Creatinine, Ser: 0.66 mg/dL (ref 0.61–1.24)
GFR calc Af Amer: >60 mL/min (ref >60)
GFR calc non Af Amer: >60 mL/min (ref >60)
Glucose, Bld: 119 mg/dL — ABNORMAL HIGH (ref 65–99)
Potassium: 3.9 mmol/L (ref 3.5–5.1)
Sodium: 137 mmol/L (ref 135–145)
Total Bilirubin: 0.5 mg/dL (ref 0.3–1.2)
Total Protein: 6.1 g/dL — ABNORMAL LOW (ref 6.5–8.1)

## 2016-12-22 LAB — CBC WITH DIFFERENTIAL/PLATELET
Basophils Absolute: 0 10*3/uL (ref 0.0–0.1)
Basophils Relative: 1 %
Eosinophils Absolute: 0.2 10*3/uL (ref 0.0–0.7)
Eosinophils Relative: 4 %
HCT: 33.1 % — ABNORMAL LOW (ref 39.0–52.0)
Hemoglobin: 10.1 g/dL — ABNORMAL LOW (ref 13.0–17.0)
Lymphocytes Relative: 21 %
Lymphs Abs: 1 10*3/uL (ref 0.7–4.0)
MCH: 26.1 pg (ref 26.0–34.0)
MCHC: 30.5 g/dL (ref 30.0–36.0)
MCV: 85.5 fL (ref 78.0–100.0)
Monocytes Absolute: 0.6 10*3/uL (ref 0.1–1.0)
Monocytes Relative: 13 %
Neutro Abs: 2.9 10*3/uL (ref 1.7–7.7)
Neutrophils Relative %: 61 %
Platelets: 467 10*3/uL — ABNORMAL HIGH (ref 150–400)
RBC: 3.87 MIL/uL — ABNORMAL LOW (ref 4.22–5.81)
RDW: 14 % (ref 11.5–15.5)
WBC: 4.7 10*3/uL (ref 4.0–10.5)

## 2016-12-22 LAB — CK: Total CK: 226 U/L (ref 49–397)

## 2016-12-22 MED ORDER — HYDROMORPHONE HCL 1 MG/ML IJ SOLN
1.0000 mg | Freq: Once | INTRAMUSCULAR | Status: AC
  Administered 2016-12-22: 1 mg via INTRAMUSCULAR
  Filled 2016-12-22: qty 1

## 2016-12-22 MED ORDER — OXYCODONE-ACETAMINOPHEN 5-325 MG PO TABS
1.0000 | ORAL_TABLET | Freq: Once | ORAL | Status: AC
  Administered 2016-12-22: 1 via ORAL

## 2016-12-22 MED ORDER — OXYCODONE-ACETAMINOPHEN 5-325 MG PO TABS
ORAL_TABLET | ORAL | Status: DC
Start: 2016-12-22 — End: 2016-12-22
  Filled 2016-12-22: qty 1

## 2016-12-22 NOTE — Discharge Instructions (Signed)
Continue taking your pain meds as prescribed.   Continue with ted hoses.   Try and stay off your leg as much as possible.   Elevate your leg.   Call your orthopedic doctor tomorrow for follow up this week   Return to ER if you have worse leg pain or swelling, chest pain, trouble breathing, toes turning blue.

## 2016-12-22 NOTE — Progress Notes (Signed)
*  PRELIMINARY RESULTS* Vascular Ultrasound Right lower extremity venous duplex has been completed.  Preliminary findings: No evidence of DVT or baker's cyst. Enlarged inguinal lymph nodes bilaterally.    Landry Mellow, RDMS, RVT  12/22/2016, 2:02 PM

## 2016-12-22 NOTE — ED Provider Notes (Signed)
[place] DEPT Provider Note   CSN: [id number] Arrival date & time: 12/22/16  1215     History   Chief Complaint Chief Complaint  Patient presents with  . Hip Pain  . Post-op Problem    HPI Paul Ray is a 61 y.o. male hx of ADD, anxiety, bipolar, Recent right hip resurfacing about a month ago at [facility] who presenting with progressive right leg swelling and right hip pain. Patient states that she had right hip surgery about a month ago and was admitted for about 10 days due to some postop bleeding. Patient required transfusion is currently on iron pills. Patient has been more active and has been walking more and noticed progressive right calf pain and right leg pain over the last week or so. Denies any chest pain or shortness of breath. Patient is prescribed hydromorphone and oxycodone for pain by his pain medicine doctor. Denies any fevers or chills and denies any drainage from the surgical wound.   The history is provided by the patient.    Past Medical History:  Diagnosis Date  . Abscess of lung(513.0)    Right lower lobe  . ADD (attention deficit disorder)   . Anxiety   . Arthritis   . Bipolar depression ([place])   . Empyema lung ([place])   . Hemorrhoids   . Hepatitis C   . Pneumonia    last year  . Prostate abscess   . Prostate troubles     Patient Active Problem List   Diagnosis Date Noted  . Impingement syndrome of right shoulder 07/05/2013  . Empyema lung ([place])   . Abscess of lung(513.0)   . Bipolar depression ([place])   . ADD (attention deficit disorder)   . Hepatitis C   . Lung mass 10/22/2010  . Pleural effusion 10/15/2010    Past Surgical History:  Procedure Laterality Date  . ELBOW SURGERY  2005   Right  . FOOT NEUROMA SURGERY  2006   Left  . HIP RESURFACING     left hip at [facility] 01/2011  . LUMBAR LAMINECTOMY/DECOMPRESSION MICRODISCECTOMY  07/24/2011   Procedure: LUMBAR LAMINECTOMY/DECOMPRESSION MICRODISCECTOMY;  Surgeon: Eustace Moore;   Location: [place] [facility];  Service: Neurosurgery;  Laterality: Bilateral;  Bilateral  , Lumbar Three-Four, Lumbar Four-Five Decompressive Laminectomy Rm # 32  . LUNG SURGERY     for empyema  . SHOULDER ARTHROSCOPY Right 07/05/2013   Procedure: RIGHT SHOULDER ARTHROSCOPY WITH DEBRIDEMENT;  Surgeon: Mcarthur Rossetti, MD;  Location: [place];  Service: Orthopedics;  Laterality: Right;  . TENDON TRANSFER Left 01/26/2014   Procedure: LEFT THUMB TRAPEZIECTOMY KNOTTED TENDON INTRAPOSITION TRANSFER OF ABDUCTOR POLLICIS LONGUS TO [place];  Surgeon: Cammie Sickle, MD;  Location: [place];  Service: Orthopedics;  Laterality: Left;  . THORACOTOMY  November 14 2010   rt       Home Medications    Prior to Admission medications   Medication Sig Start Date End Date Taking? Authorizing Provider  alprazolam Duanne Moron) 2 MG tablet Take 2 mg by mouth 2 (two) times daily as needed for anxiety. 10/28/16  Yes [provider]  amphetamine-dextroamphetamine (ADDERALL XR) 30 MG 24 hr capsule Take 60 mg by mouth every morning.   Yes [provider]  amphetamine-dextroamphetamine (ADDERALL) 20 MG tablet Take 20 mg by mouth daily. 08/13/16  Yes [provider]  b complex vitamins tablet Take 1 tablet by mouth daily.   Yes [provider]  bisacodyl (DULCOLAX) 5 MG  EC tablet Take 5 mg by mouth daily as needed for constipation.   Yes [provider]  divalproex (DEPAKOTE) 500 MG EC tablet Take 1,000 mg by mouth every evening.    Yes [provider]  docusate sodium (COLACE) 100 MG capsule Take 100 mg by mouth daily.   Yes [provider]  Glucosamine HCl-MSM (GLUCOSAMINE-MSM PO) Take 1 tablet by mouth every morning. 1500mg  of each drug   Yes [provider]  HYDROmorphone (DILAUDID) 4 MG tablet Take 4 mg by mouth every 4 (four) hours.  08/14/16  Yes [provider]  lamoTRIgine (LAMICTAL) 100 MG tablet Take 100 mg by mouth daily.     Yes [provider]  morphine (MS CONTIN) 30 MG 12 hr tablet Take 60 mg by mouth 2 (two) times daily.  08/14/16  Yes [provider]  Multiple Vitamin (MULTIVITAMIN) capsule Take 1 capsule by mouth daily.     Yes [provider]  naproxen sodium (ANAPROX) 220 MG tablet Take 220 mg by mouth 2 (two) times daily as needed (pain).   Yes [provider]  Tamsulosin HCl (FLOMAX) 0.4 MG CAPS Take 0.8 mg by mouth daily.    Yes [provider]  cephALEXin (KEFLEX) 500 MG capsule Take 1 capsule (500 mg total) by mouth 3 (three) times daily. Patient not taking: Reported on 10/08/2016 06/05/14   Wallene Huh, DPM  JUBLIA 10 % SOLN APPLY TO AFFECTED NAILS DAILY Patient not taking: Reported on 12/22/2016 05/10/14   Harriet Masson, DPM    Family History Family History  Problem Relation Age of Onset  . COPD Mother   . Heart disease Mother   . Hypertension Mother   . Coronary artery disease Father   . Dementia Father   . Heart failure Father   . Prostate cancer Paternal Grandfather        also had bone cancer  . Breast cancer Maternal Grandmother   . Cancer Paternal Grandmother        unsure what kind    Social History Social History  Substance Use Topics  . Smoking status: Never Smoker  . Smokeless tobacco: Never Used  . Alcohol use No     Comment: quit drinking in 1989     Allergies   Patient has no known allergies.   Review of Systems Review of Systems  Cardiovascular: Positive for leg swelling.  All other systems reviewed and are negative.    Physical Exam Updated Vital Signs BP 122/73   Pulse 66   Temp 97.8 F (36.6 C) (Oral)   Resp 16   Ht 5\' 9"  (1.753 m)   Wt 77.1 kg (170 lb)   SpO2 100%   BMI 25.10 kg/m   Physical Exam  Constitutional: He is oriented to person, place, and time.  Chronically ill, uncomfortable, anxious   HENT:  Head: Normocephalic.  Mouth/Throat: Oropharynx is clear and moist.  Eyes: EOM are  normal. Pupils are equal, round, and reactive to light.  Neck: Normal range of motion. Neck supple.  Cardiovascular: Normal rate, regular rhythm and normal heart sounds.   Pulmonary/Chest: Effort normal and breath sounds normal. No respiratory distress. He has no wheezes.  Abdominal: Soft. Bowel sounds are normal. He exhibits no distension.  Musculoskeletal:  R hip surgical site healing well, no erythema or drainage. R leg swollen with 1+ edema and mild calf tenderness. 2+ pulses, able to wiggle toes. Neurovascular intact RLE   Neurological: He is alert and  oriented to person, place, and time. No cranial nerve deficit.  Skin: Skin is warm.  Psychiatric: He has a normal mood and affect.  Nursing note and vitals reviewed.    ED Treatments / Results  Labs (all labs ordered are listed, but only abnormal results are displayed) Labs Reviewed  CBC WITH DIFFERENTIAL/PLATELET - Abnormal; Notable for the following:       Result Value   RBC 3.87 (*)    Hemoglobin 10.1 (*)    HCT 33.1 (*)    Platelets 467 (*)    All other components within normal limits  COMPREHENSIVE METABOLIC PANEL - Abnormal; Notable for the following:    Chloride 99 (*)    Glucose, Bld 119 (*)    Calcium 8.5 (*)    Total Protein 6.1 (*)    Albumin 3.0 (*)    ALT 13 (*)    All other components within normal limits  CK    EKG  EKG Interpretation None       Radiology Dg Hip Unilat W Or Wo Pelvis 1 View Right  Result Date: 12/22/2016 CLINICAL DATA:  Pain, recent resurfacing surgery EXAM: DG HIP (WITH OR WITHOUT PELVIS) 1V RIGHT COMPARISON:  09/09/2016 FINDINGS: Status post left hip resurfacing, unchanged. Associated heterotopic calcification. Interval right hip resurfacing. No fracture or dislocation is seen. Visualized bony pelvis appears intact. Mild degenerative changes of the lower lumbar spine. IMPRESSION: Status post bilateral hip resurfacing. No acute osseus abnormality is seen. Electronically Signed   By:  Julian Hy M.D.   On: 12/22/2016 16:13    Procedures Procedures (including critical care time)  Medications Ordered in ED Medications  oxyCODONE-acetaminophen (PERCOCET/ROXICET) 5-325 MG per tablet (not administered)  oxyCODONE-acetaminophen (PERCOCET/ROXICET) 5-325 MG per tablet 1 tablet (1 tablet Oral Given 12/22/16 1247)  HYDROmorphone (DILAUDID) injection 1 mg (1 mg Intramuscular Given 12/22/16 1657)     Initial Impression / Assessment and Plan / ED Course  I have reviewed the triage vital signs and the nursing notes.  Pertinent labs & imaging results that were available during my care of the patient were reviewed by me and considered in my medical decision making (see chart for details).     Paul Ray is a 61 y.o. male here with R hip pain, R leg swelling. No signs of postop infection, nl ROM R hip. Entire R leg swollen but DVT study neg. There is incidental enlarged lymph nodes bilateral inguinal area but no signs of cellulitis on my exam. WBC nl. Hg 10 (was 9 on discharge on care everywhere), xray hip showed hardware intact. CK nl. I doubt rhabdo or compartment syndrome. Upon discharge, he states that his groin has been hurting. I examined testicles and they are nontender and there is no obvious hernia. I think the groin pain is likely from the enlarge lymph nodes on the [place]. Told him to continue pain meds, use ted hoses, elevate his leg and call his orthopedic doctor for follow up next week     Final Clinical Impressions(s) / ED Diagnoses   Final diagnoses:  None    New Prescriptions New Prescriptions   No medications on file     Drenda Freeze, MD 12/22/16 1745

## 2016-12-22 NOTE — ED Triage Notes (Signed)
Pt in c/o R  Hip pain & R leg pain worse in calf, pt had R hip surgery x 10 days ago in [place], per pt he had procedure completed by  Surgeon Enis Slipper, MD, pt reports worsening pain & calf pain since surgery, worsening over the last week, A&O x4

## 2016-12-24 ENCOUNTER — Ambulatory Visit: Payer: BLUE CROSS/BLUE SHIELD | Attending: Orthopedic Surgery | Admitting: Physical Therapy

## 2016-12-24 DIAGNOSIS — M25551 Pain in right hip: Secondary | ICD-10-CM | POA: Diagnosis not present

## 2016-12-24 DIAGNOSIS — R262 Difficulty in walking, not elsewhere classified: Secondary | ICD-10-CM

## 2016-12-24 DIAGNOSIS — R600 Localized edema: Secondary | ICD-10-CM

## 2016-12-24 DIAGNOSIS — M25651 Stiffness of right hip, not elsewhere classified: Secondary | ICD-10-CM

## 2016-12-25 ENCOUNTER — Encounter: Payer: Self-pay | Admitting: Physical Therapy

## 2016-12-25 ENCOUNTER — Ambulatory Visit: Payer: BLUE CROSS/BLUE SHIELD | Admitting: Physical Therapy

## 2016-12-25 DIAGNOSIS — R262 Difficulty in walking, not elsewhere classified: Secondary | ICD-10-CM

## 2016-12-25 DIAGNOSIS — R600 Localized edema: Secondary | ICD-10-CM

## 2016-12-25 DIAGNOSIS — M25651 Stiffness of right hip, not elsewhere classified: Secondary | ICD-10-CM

## 2016-12-25 DIAGNOSIS — M25551 Pain in right hip: Secondary | ICD-10-CM

## 2016-12-25 NOTE — Therapy (Signed)
[place], [place], [address] Phone: [phone]   Fax:  [phone]  Physical Therapy Treatment  Patient Details  Name: Paul Ray MRN: [id number] Date of Birth: Sep 15, 1955 Referring Provider: Dr Sallyanne Havers   Encounter Date: 12/25/2016      PT End of Session - 12/25/16 1723    Visit Number 2   Number of Visits 16   Date for PT Re-Evaluation 02/18/17   Authorization Type BCBS  Ded met   PT Start Time 1507   PT Stop Time 1545   PT Time Calculation (min) 38 min   Activity Tolerance Patient tolerated treatment well   Behavior During Therapy [facility] for tasks assessed/performed      Past Medical History:  Diagnosis Date  . Abscess of lung(513.0)    Right lower lobe  . ADD (attention deficit disorder)   . Anxiety   . Arthritis   . Bipolar depression ([place])   . Empyema lung ([place])   . Hemorrhoids   . Hepatitis C   . Pneumonia    last year  . Prostate abscess   . Prostate troubles     Past Surgical History:  Procedure Laterality Date  . ELBOW SURGERY  2005   Right  . FOOT NEUROMA SURGERY  2006   Left  . HIP RESURFACING     [facility] hip at [facility] 01/2011  . LUMBAR LAMINECTOMY/DECOMPRESSION MICRODISCECTOMY  07/24/2011   Procedure: LUMBAR LAMINECTOMY/DECOMPRESSION MICRODISCECTOMY;  Surgeon: Eustace Moore;  Location: [place] [facility];  Service: Neurosurgery;  Laterality: Bilateral;  Bilateral  , Lumbar Three-Four, Lumbar Four-Five Decompressive Laminectomy Rm # 32  . LUNG SURGERY     for empyema  . SHOULDER ARTHROSCOPY Right 07/05/2013   Procedure: RIGHT SHOULDER ARTHROSCOPY WITH DEBRIDEMENT;  Surgeon: Mcarthur Rossetti, MD;  Location: [place];  Service: Orthopedics;  Laterality: Right;  . TENDON TRANSFER Left 01/26/2014   Procedure: LEFT THUMB TRAPEZIECTOMY KNOTTED TENDON INTRAPOSITION TRANSFER OF ABDUCTOR POLLICIS LONGUS TO [place];  Surgeon: Cammie Sickle, MD;  Location: [place];   Service: Orthopedics;  Laterality: Left;  . THORACOTOMY  November 14 2010   rt    There were no vitals filed for this visit.      Subjective Assessment - 12/25/16 1532    Subjective Patient reports his pain is likely a little better then yesterday. His pain level is still about an 8/10. He    Pertinent History elbow surgery 2005; shoulder scope 2014; lumbar decompression 2014    Limitations Standing;Walking;House hold activities;Lifting   How long can you sit comfortably? No limit    How long can you stand comfortably? < 5 minutes before pain increases    How long can you walk comfortably? 200-300 feet in the community before pain increases    Diagnostic tests [place] doppler 12/22/2016 per ED notes (-)    Patient Stated Goals to have less pain with walking    Currently in Pain? Yes   Pain Score 8    Pain Location Hip   Pain Orientation Right   Pain Descriptors / Indicators Aching   Pain Type Acute pain   Pain Onset More than a month ago   Pain Frequency Constant   Aggravating Factors  standing and walking    Pain Relieving Factors rest and ice    Effect of Pain on Daily Activities difficulty perfroming             [facility] PT  Assessment - 12/25/16 0001      Assessment   Medical Diagnosis Right hip resurfacing    Referring Provider Dr Sallyanne Havers    Onset Date/Surgical Date 12/02/16   Hand Dominance Right   Next MD Visit 01/14/2017   Prior Therapy 6 years ago      Precautions   Precautions Anterior Hip     Restrictions   Weight Bearing Restrictions No     Balance Screen   Has the patient fallen in the past 6 months No   Has the patient had a decrease in activity level because of a fear of falling?  No   Is the patient reluctant to leave their home because of a fear of falling?  No     Home Environment   Additional Comments 7 steps into the front of the house      Prior Function   Level of Independence Independent   Vocation Retired   Leisure go to the gym/ Karate       Cognition   Overall Cognitive Status Within Functional Limits for tasks assessed   Attention Focused   Focused Attention Appears intact   Memory Appears intact   Awareness Appears intact   Problem Solving Appears intact     Observation/Other Assessments   Observations Signficant swelling of right lower extremity     Skin Integrity Well healing direct lateral scar    Focus on Therapeutic Outcomes (FOTO)  Not given by staff      Sensation   Light Touch Appears Intact   Additional Comments denies parathesias      Coordination   Gross Motor Movements are Fluid and Coordinated Yes   Fine Motor Movements are Fluid and Coordinated Yes     AROM   Overall AROM  Within functional limits for tasks performed;Deficits   AROM Assessment Site Hip   Right/Left Hip Right   Right Hip Flexion 65   Right Hip ABduction --  pain with active hip abduction      PROM   Overall PROM Comments pain with gentle IR and ER    PROM Assessment Site Hip   Right/Left Hip Right;Left     Strength   Overall Strength Comments lwft hip 5/5    Right Hip Flexion 2+/5   Right Hip Extension 2/5   Right Hip ABduction 3/5     Right Hip   Right Hip Flexion 70  with pain and stiffness at end range      Palpation   Palpation comment No unexpected tednerness to palpation      Transfers   Comments Slow trasfer from sit to stand. Has to use 2 hands to assist.      Ambulation/Gait   Gait Comments Limited hip flexion on the right; Hip hike on the right; flexed trunk; Using cane correctly                      [facility] Adult PT Treatment/Exercise - 12/25/16 1719      Lumbar Exercises: Seated   Other Seated Lumbar Exercises seated clam with yellow in limited range 2x10; Ball squeeze 2x10;      Knee/Hip Exercises: Aerobic   Nustep 5 min level 5      Knee/Hip Exercises: Seated   Long Arc Quad Limitations 2x10      Knee/Hip Exercises: Supine   Quad Sets Limitations Patient reported some  discomfort    Heel Slides Limitations 2x10     Manual Therapy  Manual therapy comments Gentle Passive ROM into flexion ER and IR. Careful not to push into pain or end ranges                PT Education - 12/25/16 1722    Education provided Yes   Education Details reviewed HEP, improtance of symptom management    Person(s) Educated Patient   Methods Explanation;Demonstration;Tactile cues   Comprehension Verbalized understanding;Returned demonstration;Verbal cues required;Tactile cues required;Need further instruction          PT Short Term Goals - 12/25/16 0947      PT SHORT TERM GOAL #1   Title Patient will increase gross right lower extremity strangth to 4+/5    Time 4   Period Weeks   Status New     PT SHORT TERM GOAL #2   Title Patient will increase passive right hip flexion by 10 degrees    Time 4   Period Weeks   Status New     PT SHORT TERM GOAL #3   Title Patient will be independent with initial HEP    Time 4   Period Weeks   Status New     PT SHORT TERM GOAL #4   Title Patient will report 2/10 pain at worst in right hip    Time 4   Period Weeks   Status New           PT Long Term Goals - 12/25/16 2863      PT LONG TERM GOAL #1   Title Patient will ambualte 3000' with no device and miniaml pain and antalgic gait in order to ambualte in the community   Time 4   Period Weeks   Status New     PT LONG TERM GOAL #2   Title Patient will stand for 20 minutes without increased pain in order to perfrom ADL's    Time 8   Period Weeks   Status New     PT LONG TERM GOAL #3   Title Patient will increase active hip flexion to 90 degrees in order to walk and sit comfortably    Time 8   Period Weeks   Status New               Plan - 12/25/16 1724    Clinical Impression Statement Patient was 7 min late to appointment. He tolerated treatment well. He had less swelling today but he was wearing a higher ted hose. Therapy updated HEP and  advised the patient to monitor his swelling. Of note, the patient was forund 45 minutes after his appointment pulling brush out of the woods between therapy building and neighboring businesses to make a path. He was strongly advised not to do activity's such as this at this time as it may damage his hip.    History and Personal Factors relevant to plan of care: bipolar depression, arthritis, swelling of the leg    Rehab Potential Good   PT Frequency 2x / week   PT Duration 8 weeks   PT Treatment/Interventions ADLs/Self Care Home Management;Cryotherapy;Electrical Stimulation;Iontophoresis 70m/ml Dexamethasone;Traction;Moist Heat;Gait training;Stair training;Functional mobility training;Patient/family education;Therapeutic exercise;Therapeutic activities;Ultrasound;Manual techniques;Passive range of motion;Dry needling;Taping;Vasopneumatic Device   PT Next Visit Plan work on edema control, Consdeider light hip flexion stretching, attempted to give patient for home but as having some pain; add supine hip dexion in low range; consdier low rangf clam shells; Add nu-step;    PT Home Exercise Plan heel slide; glut set, quad set, LAQ  Consulted and Agree with Plan of Care Patient      Patient will benefit from skilled therapeutic intervention in order to improve the following deficits and impairments:  Abnormal gait, Decreased range of motion, Difficulty walking, Pain, Decreased endurance, Decreased knowledge of precautions, Decreased strength, Decreased mobility, Impaired sensation, Postural dysfunction  Visit Diagnosis: Stiffness of right hip, not elsewhere classified  Pain in right hip  Difficulty in walking, not elsewhere classified  Localized edema     Problem List Patient Active Problem List   Diagnosis Date Noted  . Impingement syndrome of right shoulder 07/05/2013  . Empyema lung ([place])   . Abscess of lung(513.0)   . Bipolar depression ([place])   . ADD (attention deficit disorder)   .  Hepatitis C   . Lung mass 10/22/2010  . Pleural effusion 10/15/2010    Carney Living PT DPT  12/25/2016, 5:31 PM  [facility] [address] [place], [place], [address] Phone: [phone]   Fax:  [phone]  Name: Paul Ray MRN: [id number] Date of Birth: 1955/11/10

## 2016-12-25 NOTE — Therapy (Signed)
[place], [place], [address] Phone: [phone]   Fax:  [phone]  Physical Therapy Evaluation  Patient Details  Name: Paul Ray MRN: [id number] Date of Birth: 02-21-1956 Referring Provider: Dr Sallyanne Havers   Encounter Date: 12/24/2016      PT End of Session - 12/24/16 1600    Visit Number 1   Number of Visits 16   Date for PT Re-Evaluation 02/18/17   Authorization Type BCBS  Ded met   PT Start Time 1548   PT Stop Time 1632   PT Time Calculation (min) 44 min   Activity Tolerance Patient tolerated treatment well   Behavior During Therapy [facility] for tasks assessed/performed      Past Medical History:  Diagnosis Date  . Abscess of lung(513.0)    Right lower lobe  . ADD (attention deficit disorder)   . Anxiety   . Arthritis   . Bipolar depression ([place])   . Empyema lung ([place])   . Hemorrhoids   . Hepatitis C   . Pneumonia    last year  . Prostate abscess   . Prostate troubles     Past Surgical History:  Procedure Laterality Date  . ELBOW SURGERY  2005   Right  . FOOT NEUROMA SURGERY  2006   Left  . HIP RESURFACING     left hip at [facility] 01/2011  . LUMBAR LAMINECTOMY/DECOMPRESSION MICRODISCECTOMY  07/24/2011   Procedure: LUMBAR LAMINECTOMY/DECOMPRESSION MICRODISCECTOMY;  Surgeon: Eustace Moore;  Location: [place] NEURO ORS;  Service: Neurosurgery;  Laterality: Bilateral;  Bilateral  , Lumbar Three-Four, Lumbar Four-Five Decompressive Laminectomy Rm # 32  . LUNG SURGERY     for empyema  . SHOULDER ARTHROSCOPY Right 07/05/2013   Procedure: RIGHT SHOULDER ARTHROSCOPY WITH DEBRIDEMENT;  Surgeon: Mcarthur Rossetti, MD;  Location: [place];  Service: Orthopedics;  Laterality: Right;  . TENDON TRANSFER Left 01/26/2014   Procedure: LEFT THUMB TRAPEZIECTOMY KNOTTED TENDON INTRAPOSITION TRANSFER OF ABDUCTOR POLLICIS LONGUS TO [place];  Surgeon: Cammie Sickle, MD;  Location: [place];   Service: Orthopedics;  Laterality: Left;  . THORACOTOMY  November 14 2010   rt    There were no vitals filed for this visit.       Subjective Assessment - 12/25/16 0829    Subjective Patient had a hip resurfacing done on May 8th in [place]. since that point he has had pain and swelling in his right leg. He went to the ED on 12/22/2016 and was cleared for DVT's. He is currently using a cane for ambualtion. He had his left hip done 6 years ago.    Pertinent History elbow surgery 2005; shoulder scope 2014; lumbar decompression 2014    Limitations Standing;Walking;House hold activities;Lifting   How long can you sit comfortably? No limit    How long can you stand comfortably? < 5 minutes before pain increases    How long can you walk comfortably? 200-300 feet in the community before pain increases    Diagnostic tests Venus doppler 12/22/2016 per ED notes (-)    Patient Stated Goals to have less pain with walking    Currently in Pain? Yes   Pain Score 8    Pain Location Leg   Pain Orientation Right   Pain Descriptors / Indicators Aching   Pain Type Acute pain   Pain Onset More than a month ago   Pain Frequency Constant   Aggravating Factors  standing and walking  Pain Relieving Factors rest and ice    Effect of Pain on Daily Activities difficulty perfroming ADL's and IADL's             [facility] PT Assessment - 12/25/16 0001      Assessment   Medical Diagnosis Right hip resurfacing    Referring Provider Dr Sallyanne Havers    Onset Date/Surgical Date 12/02/16   Hand Dominance Right   Next MD Visit 01/14/2017   Prior Therapy 6 years ago      Precautions   Precautions Anterior Hip     Restrictions   Weight Bearing Restrictions No     Balance Screen   Has the patient fallen in the past 6 months No   Has the patient had a decrease in activity level because of a fear of falling?  No   Is the patient reluctant to leave their home because of a fear of falling?  No     Home  Environment   Additional Comments 7 steps into the front of the house      Prior Function   Level of Independence Independent   Vocation Retired   Leisure go to the gym/ Karate      Cognition   Overall Cognitive Status Within Functional Limits for tasks assessed   Attention Focused   Focused Attention Appears intact   Memory Appears intact   Awareness Appears intact   Problem Solving Appears intact     Observation/Other Assessments   Observations Signficant swelling of right lower extremity     Skin Integrity Well healing direct lateral scar    Focus on Therapeutic Outcomes (FOTO)  Not given by staff      Sensation   Light Touch Appears Intact   Additional Comments denies parathesias      Coordination   Gross Motor Movements are Fluid and Coordinated Yes   Fine Motor Movements are Fluid and Coordinated Yes     AROM   Overall AROM  Within functional limits for tasks performed;Deficits   AROM Assessment Site Hip   Right/Left Hip Right   Right Hip Flexion 65   Right Hip ABduction --  pain with active hip abduction      PROM   Overall PROM Comments pain with gentle IR and ER    PROM Assessment Site Hip   Right/Left Hip Right;Left     Strength   Overall Strength Comments lwft hip 5/5    Right Hip Flexion 2+/5   Right Hip Extension 2/5   Right Hip ABduction 3/5     Right Hip   Right Hip Flexion 70  with pain and stiffness at end range      Palpation   Palpation comment No unexpected tednerness to palpation      Transfers   Comments Slow trasfer from sit to stand. Has to use 2 hands to assist.      Ambulation/Gait   Gait Comments Limited hip flexion on the right; Hip hike on the right; flexed trunk; Using cane correctly             Objective measurements completed on examination: See above findings.          [facility] Adult PT Treatment/Exercise - 12/25/16 0001      Knee/Hip Exercises: Seated   Long Arc Quad Limitations 2x10      Knee/Hip  Exercises: Supine   Quad Sets Limitations x10 with glut set mod cuing for technique    Heel Slides Limitations x10  PT Education - 12/25/16 0843    Education provided Yes   Education Details reviewed basic HEP, Reviewed the importance of improving hip flexion. Symptom mangement    Person(s) Educated Patient   Methods Explanation;Demonstration;Tactile cues   Comprehension Verbalized understanding;Verbal cues required;Tactile cues required;Need further instruction;Returned demonstration          PT Short Term Goals - 12/25/16 0947      PT SHORT TERM GOAL #1   Title Patient will increase gross right lower extremity strangth to 4+/5    Time 4   Period Weeks   Status New     PT SHORT TERM GOAL #2   Title Patient will increase passive right hip flexion by 10 degrees    Time 4   Period Weeks   Status New     PT SHORT TERM GOAL #3   Title Patient will be independent with initial HEP    Time 4   Period Weeks   Status New     PT SHORT TERM GOAL #4   Title Patient will report 2/10 pain at worst in right hip    Time 4   Period Weeks   Status New           PT Long Term Goals - 12/25/16 6384      PT LONG TERM GOAL #1   Title Patient will ambualte 3000' with no device and miniaml pain and antalgic gait in order to ambualte in the community   Time 4   Period Weeks   Status New     PT LONG TERM GOAL #2   Title Patient will stand for 20 minutes without increased pain in order to perfrom ADL's    Time 8   Period Weeks   Status New     PT LONG TERM GOAL #3   Title Patient will increase active hip flexion to 90 degrees in order to walk and sit comfortably    Time 8   Period Weeks   Status New                Plan - 12/25/16 0845    Clinical Impression Statement Patient is a 61 year old male with expected right hip pain and weakness following a right hip resurfacing on 12/02/2016. He has signficiant swelling at this time. He has limitations  in passive and active hip flexion. He has weakness in his hip and knee musculature. He would benefit from skilled therapy to improve the above defcitis. He was seen for a moderate complexty evaluation.    History and Personal Factors relevant to plan of care: Bipolar depression, arthritis, swelling in the leg    Clinical Presentation Evolving   Clinical Presentation due to: pain and stiffness that remain at high levels and fluctuate.    Clinical Decision Making Moderate   Rehab Potential Good   PT Frequency 2x / week   PT Duration 8 weeks   PT Treatment/Interventions ADLs/Self Care Home Management;Cryotherapy;Electrical Stimulation;Iontophoresis 63m/ml Dexamethasone;Traction;Moist Heat;Gait training;Stair training;Functional mobility training;Patient/family education;Therapeutic exercise;Therapeutic activities;Ultrasound;Manual techniques;Passive range of motion;Dry needling;Taping;Vasopneumatic Device   PT Next Visit Plan work on edema control, Consdeider light hip flexion stretching, attempted to give patient for home but as having some pain; add supine hip dexion in low range; consdier low rangf clam shells; Add nu-step;    PT Home Exercise Plan heel slide; glut set, quad set, LAQ    Consulted and Agree with Plan of Care Patient      Patient will  benefit from skilled therapeutic intervention in order to improve the following deficits and impairments:  Abnormal gait, Decreased range of motion, Difficulty walking, Pain, Decreased endurance, Decreased knowledge of precautions, Decreased strength, Decreased mobility, Impaired sensation, Postural dysfunction  Visit Diagnosis: Pain in right hip - Plan: PT plan of care cert/re-cert  Stiffness of right hip, not elsewhere classified - Plan: PT plan of care cert/re-cert  Difficulty in walking, not elsewhere classified - Plan: PT plan of care cert/re-cert  Localized edema - Plan: PT plan of care cert/re-cert     Problem List Patient Active  Problem List   Diagnosis Date Noted  . Impingement syndrome of right shoulder 07/05/2013  . Empyema lung ([place])   . Abscess of lung(513.0)   . Bipolar depression ([place])   . ADD (attention deficit disorder)   . Hepatitis C   . Lung mass 10/22/2010  . Pleural effusion 10/15/2010    Carney Living PT DPT  12/25/2016, 10:14 AM  [facility] [address] [place], [place], [address] Phone: [phone]   Fax:  [phone]  Name: Paul Ray MRN: [id number] Date of Birth: 06/03/1956

## 2016-12-29 ENCOUNTER — Encounter: Payer: Self-pay | Admitting: Physical Therapy

## 2016-12-29 ENCOUNTER — Ambulatory Visit: Payer: BLUE CROSS/BLUE SHIELD | Attending: Orthopedic Surgery | Admitting: Physical Therapy

## 2016-12-29 DIAGNOSIS — M25651 Stiffness of right hip, not elsewhere classified: Secondary | ICD-10-CM | POA: Insufficient documentation

## 2016-12-29 DIAGNOSIS — R262 Difficulty in walking, not elsewhere classified: Secondary | ICD-10-CM | POA: Insufficient documentation

## 2016-12-29 DIAGNOSIS — M25551 Pain in right hip: Secondary | ICD-10-CM | POA: Diagnosis not present

## 2016-12-29 DIAGNOSIS — R600 Localized edema: Secondary | ICD-10-CM | POA: Insufficient documentation

## 2016-12-29 NOTE — Therapy (Signed)
[place], [place], [address] Phone: [phone]   Fax:  [phone]  Physical Therapy Treatment  Patient Details  Name: Paul Ray MRN: [id number] Date of Birth: [birth year] Referring Provider: Dr Sallyanne Havers   Encounter Date: 12/29/2016      PT End of Session - 12/29/16 1307    Visit Number 3   Number of Visits 16   Date for PT Re-Evaluation 02/18/17   Authorization Type BCBS  Ded met   PT Start Time 0845   PT Stop Time 0938   PT Time Calculation (min) 53 min   Activity Tolerance Patient tolerated treatment well   Behavior During Therapy [facility] for tasks assessed/performed      Past Medical History:  Diagnosis Date  . Abscess of lung(513.0)    Right lower lobe  . ADD (attention deficit disorder)   . Anxiety   . Arthritis   . Bipolar depression ([place])   . Empyema lung ([place])   . Hemorrhoids   . Hepatitis C   . Pneumonia    last year  . Prostate abscess   . Prostate troubles     Past Surgical History:  Procedure Laterality Date  . ELBOW SURGERY  2005   Right  . FOOT NEUROMA SURGERY  2006   Left  . HIP RESURFACING     left hip at [facility] 01/2011  . LUMBAR LAMINECTOMY/DECOMPRESSION MICRODISCECTOMY  07/24/2011   Procedure: LUMBAR LAMINECTOMY/DECOMPRESSION MICRODISCECTOMY;  Surgeon: Eustace Moore;  Location: [place] NEURO ORS;  Service: Neurosurgery;  Laterality: Bilateral;  Bilateral  , Lumbar Three-Four, Lumbar Four-Five Decompressive Laminectomy Rm # 32  . LUNG SURGERY     for empyema  . SHOULDER ARTHROSCOPY Right 07/05/2013   Procedure: RIGHT SHOULDER ARTHROSCOPY WITH DEBRIDEMENT;  Surgeon: Mcarthur Rossetti, MD;  Location: [place];  Service: Orthopedics;  Laterality: Right;  . TENDON TRANSFER Left 01/26/2014   Procedure: LEFT THUMB TRAPEZIECTOMY KNOTTED TENDON INTRAPOSITION TRANSFER OF ABDUCTOR POLLICIS LONGUS TO [place];  Surgeon: Cammie Sickle, MD;  Location: [place];   Service: Orthopedics;  Laterality: Left;  . THORACOTOMY  November 14 2010   rt    There were no vitals filed for this visit.      Subjective Assessment - 12/29/16 0853    Subjective Patient reported no signoificant pain over the weekend until last night. Last night he had pain when trying to sleep. He can not rememebr doing anything to get it flaired up. He is having just minor pain today.    Pertinent History elbow surgery 2005; shoulder scope 2014; lumbar decompression 2014    Limitations Standing;Walking;House hold activities;Lifting   How long can you sit comfortably? No limit    How long can you stand comfortably? < 5 minutes before pain increases    How long can you walk comfortably? 200-300 feet in the community before pain increases    Diagnostic tests Venus doppler 12/22/2016 per ED notes (-)    Patient Stated Goals to have less pain with walking    Currently in Pain? Yes   Pain Score 5    Pain Location Hip   Pain Orientation Right   Pain Descriptors / Indicators Aching   Pain Type Acute pain   Pain Onset More than a month ago   Pain Frequency Constant   Aggravating Factors  standing and walking    Pain Relieving Factors rest and ice    Effect of Pain on  Daily Activities difficulty perfro,ming                          [facility] Adult PT Treatment/Exercise - 12/29/16 0001      Lumbar Exercises: Seated   Other Seated Lumbar Exercises seated clam with yellow in limited range 2x10; Ball squeeze 2x10;      Knee/Hip Exercises: Aerobic   Nustep 5 min level 5      Knee/Hip Exercises: Standing   Heel Raises Limitations 2x10    Other Standing Knee Exercises marching 2x10      Knee/Hip Exercises: Seated   Long Arc Quad Limitations 2x10    Ball Squeeze --   Clamshell with TheraBand --  2x10 with cuing not to go to end range      Knee/Hip Exercises: Supine   Heel Slides Limitations 2x10   Other Supine Knee/Hip Exercises supine hip flexion 2x10     Modalities    Modalities Cryotherapy     Cryotherapy   Number Minutes Cryotherapy 10 Minutes     Manual Therapy   Manual therapy comments Gentle Passive ROM into flexion ER and IR. Careful not to push into pain or end ranges                PT Education - 12/29/16 0854    Education provided Yes   Education Details updated HEP; reviewed activity modification    Person(s) Educated Patient   Methods Explanation;Demonstration;Tactile cues   Comprehension Verbalized understanding;Returned demonstration;Verbal cues required;Tactile cues required;Need further instruction          PT Short Term Goals - 12/29/16 1312      PT SHORT TERM GOAL #1   Title Patient will increase gross right lower extremity strangth to 4+/5    Time 4   Period Weeks   Status On-going     PT SHORT TERM GOAL #2   Title Patient will increase passive right hip flexion by 10 degrees    Time 4   Period Weeks   Status On-going     PT SHORT TERM GOAL #3   Title Patient will be independent with initial HEP    Time 4   Period Weeks   Status On-going     PT SHORT TERM GOAL #4   Title Patient will report 2/10 pain at worst in right hip    Time 4   Period Weeks   Status On-going           PT Long Term Goals - 12/25/16 9323      PT LONG TERM GOAL #1   Title Patient will ambualte 3000' with no device and miniaml pain and antalgic gait in order to ambualte in the community   Time 4   Period Weeks   Status New     PT LONG TERM GOAL #2   Title Patient will stand for 20 minutes without increased pain in order to perfrom ADL's    Time 8   Period Weeks   Status New     PT LONG TERM GOAL #3   Title Patient will increase active hip flexion to 90 degrees in order to walk and sit comfortably    Time 8   Period Weeks   Status New               Plan - 12/29/16 1310    Clinical Impression Statement Patient tolerated treatment well. He was sore after visit but no significant  increase in pain. He was  advised to rest some after his treatment. Patient given supine hip flexion and standing heel rasies for home. He is progressing towards goals.    Clinical Presentation Evolving   Clinical Decision Making Moderate   Rehab Potential Good   PT Frequency 2x / week   PT Duration 8 weeks   PT Treatment/Interventions ADLs/Self Care Home Management;Cryotherapy;Electrical Stimulation;Iontophoresis 73m/ml Dexamethasone;Traction;Moist Heat;Gait training;Stair training;Functional mobility training;Patient/family education;Therapeutic exercise;Therapeutic activities;Ultrasound;Manual techniques;Passive range of motion;Dry needling;Taping;Vasopneumatic Device   PT Next Visit Plan work on edema control, Consdeider light hip flexion stretching, attempted to give patient for home but as having some pain; add supine hip dexion in low range; consdier low rangf clam shells; Add nu-step;    PT Home Exercise Plan heel slide; glut set, quad set, LAQ    Consulted and Agree with Plan of Care Patient      Patient will benefit from skilled therapeutic intervention in order to improve the following deficits and impairments:  Abnormal gait, Decreased range of motion, Difficulty walking, Pain, Decreased endurance, Decreased knowledge of precautions, Decreased strength, Decreased mobility, Impaired sensation, Postural dysfunction  Visit Diagnosis: Pain in right hip  Stiffness of right hip, not elsewhere classified  Difficulty in walking, not elsewhere classified  Localized edema     Problem List Patient Active Problem List   Diagnosis Date Noted  . Impingement syndrome of right shoulder 07/05/2013  . [place]yema lung ([place]   . Abscess of lung(513.0)   . [place]   . ADD (attention deficit disorder)   . Hepatitis C   . Lung mass 10/22/2010  . Pleural effusion 10/15/2010    DCarney LivingPT DPT  12/29/2016, 1:14 PM  [facility] [address]Phone: [phone]  Fax:  [phone] Name: DMARTRELL EGUIAMRN: [id number] of Birth: 508/28/1957

## 2016-12-31 ENCOUNTER — Encounter: Payer: Self-pay | Admitting: Physical Therapy

## 2016-12-31 ENCOUNTER — Ambulatory Visit: Payer: BLUE CROSS/BLUE SHIELD | Admitting: Physical Therapy

## 2016-12-31 DIAGNOSIS — M25651 Stiffness of right hip, not elsewhere classified: Secondary | ICD-10-CM | POA: Diagnosis not present

## 2016-12-31 DIAGNOSIS — M545 Low back pain: Secondary | ICD-10-CM | POA: Diagnosis not present

## 2016-12-31 DIAGNOSIS — R600 Localized edema: Secondary | ICD-10-CM

## 2016-12-31 DIAGNOSIS — R262 Difficulty in walking, not elsewhere classified: Secondary | ICD-10-CM | POA: Diagnosis not present

## 2016-12-31 DIAGNOSIS — M25551 Pain in right hip: Secondary | ICD-10-CM | POA: Diagnosis not present

## 2016-12-31 DIAGNOSIS — M25511 Pain in right shoulder: Secondary | ICD-10-CM | POA: Diagnosis not present

## 2016-12-31 DIAGNOSIS — G894 Chronic pain syndrome: Secondary | ICD-10-CM | POA: Diagnosis not present

## 2016-12-31 MED FILL — HYDROmorphone HCL 4 MG TABS: 4 | 30 days supply | Qty: 180 | Fill #0

## 2016-12-31 MED FILL — MORPHINE SULF 60 MG TAB SA: 60 | 30 days supply | Qty: 60 | Fill #0

## 2016-12-31 NOTE — Therapy (Signed)
[place], [place], [address] Phone: [phone]   Fax:  [phone]  Physical Therapy Treatment  Patient Details  Name: Paul Ray MRN: [id number] Date of Birth: 06-24-1956 Referring Provider: Dr Sallyanne Havers   Encounter Date: 12/31/2016      PT End of Session - 12/31/16 0911    Visit Number 4   Number of Visits 16   Date for PT Re-Evaluation 02/18/17   Authorization Type BCBS  Ded met   PT Start Time 0846   PT Stop Time 0934   PT Time Calculation (min) 48 min   Activity Tolerance Patient tolerated treatment well   Behavior During Therapy [facility] for tasks assessed/performed      Past Medical History:  Diagnosis Date  . Abscess of lung(513.0)    Right lower lobe  . ADD (attention deficit disorder)   . Anxiety   . Arthritis   . Bipolar depression ([place])   . Empyema lung ([place])   . Hemorrhoids   . Hepatitis C   . Pneumonia    last year  . Prostate abscess   . Prostate troubles     Past Surgical History:  Procedure Laterality Date  . ELBOW SURGERY  2005   Right  . FOOT NEUROMA SURGERY  2006   Left  . HIP RESURFACING     left hip at [facility] 01/2011  . LUMBAR LAMINECTOMY/DECOMPRESSION MICRODISCECTOMY  07/24/2011   Procedure: LUMBAR LAMINECTOMY/DECOMPRESSION MICRODISCECTOMY;  Surgeon: Eustace Moore;  Location: [place] [facility];  Service: Neurosurgery;  Laterality: Bilateral;  Bilateral  , Lumbar Three-Four, Lumbar Four-Five Decompressive Laminectomy Rm # 32  . LUNG SURGERY     for empyema  . SHOULDER ARTHROSCOPY Right 07/05/2013   Procedure: RIGHT SHOULDER ARTHROSCOPY WITH DEBRIDEMENT;  Surgeon: Mcarthur Rossetti, MD;  Location: [place];  Service: Orthopedics;  Laterality: Right;  . TENDON TRANSFER Left 01/26/2014   Procedure: LEFT THUMB TRAPEZIECTOMY KNOTTED TENDON INTRAPOSITION TRANSFER OF ABDUCTOR POLLICIS LONGUS TO [place];  Surgeon: Cammie Sickle, MD;  Location: [place];   Service: Orthopedics;  Laterality: Left;  . THORACOTOMY  November 14 2010   rt    There were no vitals filed for this visit.      Subjective Assessment - 12/31/16 0858    Subjective Patient reports he is having some stomach issues today but his hipo is feeling better. He is wearing his low ted hose without much swelling.    Pertinent History elbow surgery 2005; shoulder scope 2014; lumbar decompression 2014    Limitations Standing;Walking;House hold activities;Lifting   How long can you sit comfortably? No limit    How long can you stand comfortably? < 5 minutes before pain increases    How long can you walk comfortably? 200-300 feet in the community before pain increases    Diagnostic tests Venus doppler 12/22/2016 per ED notes (-)    Patient Stated Goals to have less pain with walking    Currently in Pain? Yes   Pain Score 5    Pain Location Hip   Pain Orientation Right   Pain Descriptors / Indicators Aching   Pain Type Acute pain   Pain Onset More than a month ago   Pain Frequency Constant   Aggravating Factors  standing and walking    Pain Relieving Factors rest and ice    Effect of Pain on Daily Activities difficulty perfroming ADL's  [place] Adult PT Treatment/Exercise - 12/31/16 0001      Lumbar Exercises: Seated   Other Seated Lumbar Exercises seated clam with yellow in limited range 2x10; Ball squeeze 2x10;      Knee/Hip Exercises: Aerobic   Nustep 5 min level 5      Knee/Hip Exercises: Standing   Heel Raises Limitations 2x10    Other Standing Knee Exercises marching 2x10      Knee/Hip Exercises: Seated   Long Arc Quad Limitations 2x10      Knee/Hip Exercises: Supine   Heel Slides Limitations 2x10 with leg off the table    Other Supine Knee/Hip Exercises supine hip flexion 2x10   Other Supine Knee/Hip Exercises clam shell red 2x10; Ball squeeze x20      Modalities   Modalities Cryotherapy     Cryotherapy   Number  Minutes Cryotherapy 10 Minutes     Manual Therapy   Manual therapy comments Gentle Passive ROM into flexion ER and IR. Careful not to push into pain or end ranges                PT Education - 12/31/16 0907    Education provided Yes   Education Details continue with strengthening; follow symtpoms and swelling as far as activity goes.    Person(s) Educated Patient   Methods Explanation;Demonstration;Tactile cues   Comprehension Verbalized understanding;Returned demonstration;Verbal cues required;Tactile cues required          PT Short Term Goals - 12/31/16 0913      PT SHORT TERM GOAL #1   Title Patient will increase gross right lower extremity strangth to 4+/5    Time 4   Period Weeks   Status On-going     PT SHORT TERM GOAL #2   Title Patient will increase passive right hip flexion by 10 degrees    Time 4   Period Weeks   Status On-going     PT SHORT TERM GOAL #3   Title Patient will be independent with initial HEP    Time 4   Period Weeks   Status On-going     PT SHORT TERM GOAL #4   Title Patient will report 2/10 pain at worst in right hip    Time 4   Period Weeks   Status On-going           PT Long Term Goals - 12/25/16 3570      PT LONG TERM GOAL #1   Title Patient will ambualte 3000' with no device and miniaml pain and antalgic gait in order to ambualte in the community   Time 4   Period Weeks   Status New     PT LONG TERM GOAL #2   Title Patient will stand for 20 minutes without increased pain in order to perfrom ADL's    Time 8   Period Weeks   Status New     PT LONG TERM GOAL #3   Title Patient will increase active hip flexion to 90 degrees in order to walk and sit comfortably    Time 8   Period Weeks   Status New               Plan - 12/31/16 1779    Clinical Impression Statement Patient tolerated treatment well. Pasive hip flexion reached 90 degrees without increased pain. He was stiff at first but ambualted better  after treatment.    Clinical Presentation Evolving   Clinical Decision Making Low  Rehab Potential Good   PT Frequency 2x / week   PT Duration 8 weeks   PT Treatment/Interventions ADLs/Self Care Home Management;Cryotherapy;Electrical Stimulation;Iontophoresis 2m/ml Dexamethasone;Traction;Moist Heat;Gait training;Stair training;Functional mobility training;Patient/family education;Therapeutic exercise;Therapeutic activities;Ultrasound;Manual techniques;Passive range of motion;Dry needling;Taping;Vasopneumatic Device   PT Next Visit Plan work on edema control, Consdeider light hip flexion stretching, attempted to give patient for home but as having some pain; add supine hip dexion in low range; consdier low rangf clam shells; Add nu-step;    PT Home Exercise Plan heel slide; glut set, quad set, LAQ    Consulted and Agree with Plan of Care Patient      Patient will benefit from skilled therapeutic intervention in order to improve the following deficits and impairments:  Abnormal gait, Decreased range of motion, Difficulty walking, Pain, Decreased endurance, Decreased knowledge of precautions, Decreased strength, Decreased mobility, Impaired sensation, Postural dysfunction  Visit Diagnosis: Pain in right hip  Stiffness of right hip, not elsewhere classified  Difficulty in walking, not elsewhere classified  Localized edema     Problem List Patient Active Problem List   Diagnosis Date Noted  . Impingement syndrome of right shoulder 07/05/2013  . Empyema lung ([place]   . Abscess of lung(513.0)   . Bipolar depression ([place]   . ADD (attention deficit disorder)   . Hepatitis C   . Lung mass 10/22/2010  . Pleural effusion 10/15/2010    DCarney LivingPT DPT  12/31/2016, 8:17 PM  [facility] [address]Phone: [phone]  Fax:  [phone] Name: Paul RAFFELMRN: [id number] of Birth:  04/12/1956/06/15

## 2017-01-02 DIAGNOSIS — S30863A Insect bite (nonvenomous) of scrotum and testes, initial encounter: Secondary | ICD-10-CM | POA: Diagnosis not present

## 2017-01-02 DIAGNOSIS — W57XXXA Bitten or stung by nonvenomous insect and other nonvenomous arthropods, initial encounter: Secondary | ICD-10-CM | POA: Diagnosis not present

## 2017-01-02 MED FILL — DOXYCYCLINE HYC 100 MG TAB: 100 | 10 days supply | Qty: 20 | Fill #0

## 2017-01-02 MED FILL — DIVALPROEX SOD DR 500 MG TA: 500 | 90 days supply | Qty: 180 | Fill #0

## 2017-01-05 ENCOUNTER — Encounter: Payer: Self-pay | Admitting: Physical Therapy

## 2017-01-05 ENCOUNTER — Ambulatory Visit: Payer: BLUE CROSS/BLUE SHIELD | Admitting: Physical Therapy

## 2017-01-05 DIAGNOSIS — M25651 Stiffness of right hip, not elsewhere classified: Secondary | ICD-10-CM

## 2017-01-05 DIAGNOSIS — R262 Difficulty in walking, not elsewhere classified: Secondary | ICD-10-CM | POA: Diagnosis not present

## 2017-01-05 DIAGNOSIS — M25551 Pain in right hip: Secondary | ICD-10-CM

## 2017-01-05 DIAGNOSIS — R600 Localized edema: Secondary | ICD-10-CM

## 2017-01-05 NOTE — Therapy (Addendum)
[place], [place], [address] Phone: [phone]   Fax:  [phone]  Physical Therapy Treatment  Patient Details  Name: Paul Ray MRN: [id number] Date of Birth: 1956/05/14 Referring Provider: Dr Sallyanne Havers   Encounter Date: 01/05/2017      PT End of Session - 01/05/17 0853    Visit Number 5   Number of Visits 16   Date for PT Re-Evaluation 02/18/17   Authorization Type BCBS  Ded met   PT Start Time 0845   PT Stop Time 0943   PT Time Calculation (min) 58 min   Activity Tolerance Patient tolerated treatment well   Behavior During Therapy [facility] for tasks assessed/performed      Past Medical History:  Diagnosis Date  . Abscess of lung(513.0)    Right lower lobe  . ADD (attention deficit disorder)   . Anxiety   . Arthritis   . Bipolar depression ([place])   . Empyema lung ([place])   . Hemorrhoids   . Hepatitis C   . Pneumonia    last year  . Prostate abscess   . Prostate troubles     Past Surgical History:  Procedure Laterality Date  . ELBOW SURGERY  2005   Right  . FOOT NEUROMA SURGERY  2006   Left  . HIP RESURFACING     left hip at [facility] 01/2011  . LUMBAR LAMINECTOMY/DECOMPRESSION MICRODISCECTOMY  07/24/2011   Procedure: LUMBAR LAMINECTOMY/DECOMPRESSION MICRODISCECTOMY;  Surgeon: Eustace Moore;  Location: [place] [facility];  Service: Neurosurgery;  Laterality: Bilateral;  Bilateral  , Lumbar Three-Four, Lumbar Four-Five Decompressive Laminectomy Rm # 32  . LUNG SURGERY     for empyema  . SHOULDER ARTHROSCOPY Right 07/05/2013   Procedure: RIGHT SHOULDER ARTHROSCOPY WITH DEBRIDEMENT;  Surgeon: Mcarthur Rossetti, MD;  Location: [place];  Service: Orthopedics;  Laterality: Right;  . TENDON TRANSFER Left 01/26/2014   Procedure: LEFT THUMB TRAPEZIECTOMY KNOTTED TENDON INTRAPOSITION TRANSFER OF ABDUCTOR POLLICIS LONGUS TO [place];  Surgeon: Cammie Sickle, MD;  Location: [place];   Service: Orthopedics;  Laterality: Left;  . THORACOTOMY  November 14 2010   rt    There were no vitals filed for this visit.      Subjective Assessment - 01/05/17 0849    Subjective Patient reports his pain is about 3/10. He has been active over the weekend. He reports the swelling is better.    Pertinent History elbow surgery 2005; shoulder scope 2014; lumbar decompression 2014    Limitations Standing;Walking;House hold activities;Lifting   How long can you sit comfortably? No limit    How long can you stand comfortably? < 5 minutes before pain increases    How long can you walk comfortably? 200-300 feet in the community before pain increases    Diagnostic tests Venus doppler 12/22/2016 per ED notes (-)    Patient Stated Goals to have less pain with walking    Currently in Pain? Yes   Pain Score 3    Pain Location Hip   Pain Orientation Right   Pain Descriptors / Indicators Aching   Pain Type Acute pain   Pain Onset More than a month ago   Pain Frequency Constant   Aggravating Factors  standing and walking    Pain Relieving Factors rest and ice    Effect of Pain on Daily Activities difficulty perfroming ADL's  [place] Adult PT Treatment/Exercise - 01/05/17 0001      Ambulation/Gait   Gait Comments ambulate 500' cuing to go straight in stead of lateral. Improved weight shift forward with cuing.      Lumbar Exercises: Seated   Other Seated Lumbar Exercises seated clam with yellow in limited range 2x10; Ball squeeze 2x10;      Knee/Hip Exercises: Aerobic   Nustep 5 min level 5      Knee/Hip Exercises: Standing   Heel Raises Limitations 2x10    Forward Step Up Limitations 2x10 4inch    Functional Squat Limitations 2x10 with cuing for technique. Advised not to do at home yet.    Other Standing Knee Exercises marching 2x10      Knee/Hip Exercises: Seated   Long Arc Quad Limitations 2x10      Knee/Hip Exercises: Supine   Heel Slides  Limitations 2x10 with leg off the table    Bridges Limitations 2x10   Other Supine Knee/Hip Exercises supine hip flexion 2x10   Other Supine Knee/Hip Exercises clam shell red 2x10; Ball squeeze x20      Modalities   Modalities Cryotherapy     Cryotherapy   Number Minutes Cryotherapy 15 Minutes   Cryotherapy Location Hip     Manual Therapy   Manual therapy comments Gentle Passive ROM into flexion ER and IR. Careful not to push into pain or end ranges                PT Education - 01/05/17 0852    Education provided Yes   Education Details continue with strengthening    Person(s) Educated Patient   Methods Explanation;Demonstration;Tactile cues   Comprehension Verbalized understanding;Returned demonstration;Verbal cues required;Tactile cues required          PT Short Term Goals - 01/05/17 0903      PT SHORT TERM GOAL #1   Title Patient will increase gross right lower extremity strangth to 4+/5    Time 4   Period Weeks   Status On-going     PT SHORT TERM GOAL #2   Title Patient will increase passive right hip flexion by 10 degrees    Time 4   Period Weeks   Status On-going     PT SHORT TERM GOAL #3   Title Patient will be independent with initial HEP    Time 4   Period Weeks   Status On-going     PT SHORT TERM GOAL #4   Title Patient will report 2/10 pain at worst in right hip    Period Weeks   Status On-going           PT Long Term Goals - 12/25/16 6761      PT LONG TERM GOAL #1   Title Patient will ambualte 3000' with no device and miniaml pain and antalgic gait in order to ambualte in the community   Time 4   Period Weeks   Status New     PT LONG TERM GOAL #2   Title Patient will stand for 20 minutes without increased pain in order to perfrom ADL's    Time 8   Period Weeks   Status New     PT LONG TERM GOAL #3   Title Patient will increase active hip flexion to 90 degrees in order to walk and sit comfortably    Time 8   Period Weeks    Status New  Plan - 01/05/17 0853    Clinical Impression Statement Patient continues to make good Progress. Therapy advanced him to steps and squats. He required cuing for technique.  He had no significant increase in pain. He is overall making good progress. He had a little pain at end range with stretches.    History and Personal Factors relevant to plan of care: bipolar depression, arthritis, swelling of the leg    Clinical Presentation due to: Pain and stiffness   Rehab Potential Good   PT Frequency 2x / week   PT Duration 8 weeks   PT Treatment/Interventions ADLs/Self Care Home Management;Cryotherapy;Electrical Stimulation;Iontophoresis 42m/ml Dexamethasone;Traction;Moist Heat;Gait training;Stair training;Functional mobility training;Patient/family education;Therapeutic exercise;Therapeutic activities;Ultrasound;Manual techniques;Passive range of motion;Dry needling;Taping;Vasopneumatic Device   PT Next Visit Plan work on edema control, Consdeider light hip flexion stretching, attempted to give patient for home but as having some pain; add supine hip dexion in low range; consdier low rangf clam shells; Add nu-step;    PT Home Exercise Plan heel slide; glut set, quad set, LAQ    Consulted and Agree with Plan of Care Patient      Patient will benefit from skilled therapeutic intervention in order to improve the following deficits and impairments:  Abnormal gait, Decreased range of motion, Difficulty walking, Pain, Decreased endurance, Decreased knowledge of precautions, Decreased strength, Decreased mobility, Impaired sensation, Postural dysfunction  Visit Diagnosis: Pain in right hip  Stiffness of right hip, not elsewhere classified  Difficulty in walking, not elsewhere classified  Localized edema     Problem List Patient Active Problem List   Diagnosis Date Noted  . Impingement syndrome of right shoulder 07/05/2013  . Empyema lung ([place]   . Abscess of  lung(513.0)   . [place] depression ([place]   . ADD (attention deficit disorder)   . Hepatitis C   . Lung mass 10/22/2010  . Pleural effusion 10/15/2010    DCarney Living PT DPT  01/05/2017, 1:00 PM  [facility] [address]Phone: [phone]  Fax:  [phone] Name: DLUCA DYARMRN: [id number] of Birth: [birth year]

## 2017-01-07 ENCOUNTER — Ambulatory Visit: Payer: BLUE CROSS/BLUE SHIELD | Admitting: Physical Therapy

## 2017-01-07 ENCOUNTER — Encounter: Payer: Self-pay | Admitting: Physical Therapy

## 2017-01-07 DIAGNOSIS — M25651 Stiffness of right hip, not elsewhere classified: Secondary | ICD-10-CM | POA: Diagnosis not present

## 2017-01-07 DIAGNOSIS — M25551 Pain in right hip: Secondary | ICD-10-CM | POA: Diagnosis not present

## 2017-01-07 DIAGNOSIS — R262 Difficulty in walking, not elsewhere classified: Secondary | ICD-10-CM | POA: Diagnosis not present

## 2017-01-07 DIAGNOSIS — R600 Localized edema: Secondary | ICD-10-CM | POA: Diagnosis not present

## 2017-01-07 NOTE — Therapy (Signed)
[place], [place], [address] Phone: [phone]   Fax:  [phone]  Physical Therapy Treatment  Patient Details  Name: Paul Ray MRN: [id number] Date of Birth: [birth year] Referring Provider: Dr Sallyanne Havers   Encounter Date: 01/07/2017      PT End of Session - 01/07/17 0851    Visit Number 6   Number of Visits 16   Date for PT Re-Evaluation 02/18/17   Authorization Type BCBS  Ded met   PT Start Time 0844   PT Stop Time 0940   PT Time Calculation (min) 56 min   Activity Tolerance Patient tolerated treatment well   Behavior During Therapy [facility] for tasks assessed/performed      Past Medical History:  Diagnosis Date  . Abscess of lung(513.0)    Right lower lobe  . ADD (attention deficit disorder)   . Anxiety   . Arthritis   . Bipolar depression ([place])   . Empyema lung ([place])   . Hemorrhoids   . Hepatitis C   . Pneumonia    last year  . Prostate abscess   . Prostate troubles     Past Surgical History:  Procedure Laterality Date  . ELBOW SURGERY  2005   Right  . FOOT NEUROMA SURGERY  2006   Left  . HIP RESURFACING     left hip at [facility] 01/2011  . LUMBAR LAMINECTOMY/DECOMPRESSION MICRODISCECTOMY  07/24/2011   Procedure: LUMBAR LAMINECTOMY/DECOMPRESSION MICRODISCECTOMY;  Surgeon: Eustace Moore;  Location: [place] [facility];  Service: Neurosurgery;  Laterality: Bilateral;  Bilateral  , Lumbar Three-Four, Lumbar Four-Five Decompressive Laminectomy Rm # 32  . LUNG SURGERY     for empyema  . SHOULDER ARTHROSCOPY Right 07/05/2013   Procedure: RIGHT SHOULDER ARTHROSCOPY WITH DEBRIDEMENT;  Surgeon: Mcarthur Rossetti, MD;  Location: [place];  Service: Orthopedics;  Laterality: Right;  . TENDON TRANSFER Left 01/26/2014   Procedure: LEFT THUMB TRAPEZIECTOMY KNOTTED TENDON INTRAPOSITION TRANSFER OF ABDUCTOR POLLICIS LONGUS TO [place];  Surgeon: Cammie Sickle, MD;  Location: [place];   Service: Orthopedics;  Laterality: Left;  . THORACOTOMY  November 14 2010   rt    There were no vitals filed for this visit.      Subjective Assessment - 01/07/17 0847    Subjective Patient reports he is more stiff today then he has been. His pain is about 5/10 at this time.    Pertinent History elbow surgery 2005; shoulder scope 2014; lumbar decompression 2014    Limitations Standing;Walking;House hold activities;Lifting   How long can you sit comfortably? No limit    How long can you stand comfortably? < 5 minutes before pain increases    How long can you walk comfortably? 200-300 feet in the community before pain increases    Diagnostic tests Venus doppler 12/22/2016 per ED notes (-)    Patient Stated Goals to have less pain with walking    Currently in Pain? Yes   Pain Score 5    Pain Location Hip   Pain Orientation Right   Pain Descriptors / Indicators Aching   Pain Type Acute pain   Pain Onset More than a month ago   Pain Frequency Constant   Aggravating Factors  standing and walking    Pain Relieving Factors rest and ice    Effect of Pain on Daily Activities difficulty perfroming ADL's  [place] Adult PT Treatment/Exercise - 01/07/17 0001      Ambulation/Gait   Gait Comments reviewed gait with the patient. Patient did not use his cane inthe clinic      Lumbar Exercises: Seated   Other Seated Lumbar Exercises seated clam with yellow in limited range 2x10; Ball squeeze 2x10;      Knee/Hip Exercises: Aerobic   Nustep 5 min level 5      Knee/Hip Exercises: Standing   Heel Raises Limitations 2x10    Forward Step Up Limitations 2x10 4inch    Functional Squat Limitations 2x10 with cuing for technique. Advised not to do at home yet.    Other Standing Knee Exercises marching 2x10      Knee/Hip Exercises: Seated   Long Arc Quad Limitations 2x10      Knee/Hip Exercises: Supine   Heel Slides Limitations 2x10 with leg off the table     Bridges Limitations 2x10   Other Supine Knee/Hip Exercises supine hip flexion 2x10   Other Supine Knee/Hip Exercises clam shell red 2x10; Ball squeeze x20      Modalities   Modalities Cryotherapy     Cryotherapy   Number Minutes Cryotherapy 15 Minutes   Cryotherapy Location Hip     Manual Therapy   Manual therapy comments Gentle Passive ROM into flexion ER and IR. Careful not to push into pain or end ranges                PT Education - 01/07/17 0850    Education provided Yes   Education Details Continue with strengtheing and stretching ayt home    Person(s) Educated Patient   Methods Explanation;Demonstration;Tactile cues   Comprehension Verbalized understanding;Returned demonstration;Verbal cues required;Tactile cues required          PT Short Term Goals - 01/05/17 0903      PT SHORT TERM GOAL #1   Title Patient will increase gross right lower extremity strangth to 4+/5    Time 4   Period Weeks   Status On-going     PT SHORT TERM GOAL #2   Title Patient will increase passive right hip flexion by 10 degrees    Time 4   Period Weeks   Status On-going     PT SHORT TERM GOAL #3   Title Patient will be independent with initial HEP    Time 4   Period Weeks   Status On-going     PT SHORT TERM GOAL #4   Title Patient will report 2/10 pain at worst in right hip    Period Weeks   Status On-going           PT Long Term Goals - 12/25/16 5638      PT LONG TERM GOAL #1   Title Patient will ambualte 3000' with no device and miniaml pain and antalgic gait in order to ambualte in the community   Time 4   Period Weeks   Status New     PT LONG TERM GOAL #2   Title Patient will stand for 20 minutes without increased pain in order to perfrom ADL's    Time 8   Period Weeks   Status New     PT LONG TERM GOAL #3   Title Patient will increase active hip flexion to 90 degrees in order to walk and sit comfortably    Time 8   Period Weeks   Status New  Plan - 01/07/17 0904    Clinical Impression Statement Patient was stiff at the beggining of treatment but felt better by the time he left. He was ale to tcomplete all exercises. He would benefit from further skjilled therapy. He feels like he is becoming very stiff in the time between Wednesday and Monday . He would like to come for an extra visit at the end of the week. Therapy agrees this may be beneicical.    Rehab Potential Good   PT Frequency 2x / week   PT Duration 8 weeks   PT Treatment/Interventions ADLs/Self Care Home Management;Cryotherapy;Electrical Stimulation;Iontophoresis 82m/ml Dexamethasone;Traction;Moist Heat;Gait training;Stair training;Functional mobility training;Patient/family education;Therapeutic exercise;Therapeutic activities;Ultrasound;Manual techniques;Passive range of motion;Dry needling;Taping;Vasopneumatic Device   PT Next Visit Plan work on edema control, Consdeider light hip flexion stretching, attempted to give patient for home but as having some pain; add supine hip dexion in low range; consdier low rangf clam shells; Add nu-step;    PT Home Exercise Plan heel slide; glut set, quad set, LAQ    Consulted and Agree with Plan of Care Patient      Patient will benefit from skilled therapeutic intervention in order to improve the following deficits and impairments:  Abnormal gait, Decreased range of motion, Difficulty walking, Pain, Decreased endurance, Decreased knowledge of precautions, Decreased strength, Decreased mobility, Impaired sensation, Postural dysfunction  Visit Diagnosis: Pain in right hip  Stiffness of right hip, not elsewhere classified  Difficulty in walking, not elsewhere classified  Localized edema     Problem List Patient Active Problem List   Diagnosis Date Noted  . Impingement syndrome of right shoulder 07/05/2013  . Empyema lung ([place]   . Abscess of lung(513.0)   . Bipolar depression ([place]   . ADD (attention  deficit disorder)   . Hepatitis C   . Lung mass 10/22/2010  . Pleural effusion 10/15/2010    DCarney Living PT DPT  01/07/2017, 1:29 PM  [facility] [address]Phone: [phone]  Fax:  [phone] Name: Paul BEKKERMRN: [id number] of Birth: 5Jul 14, 1957

## 2017-01-08 DIAGNOSIS — F410 Panic disorder [episodic paroxysmal anxiety] without agoraphobia: Secondary | ICD-10-CM | POA: Diagnosis not present

## 2017-01-08 DIAGNOSIS — F6381 Intermittent explosive disorder: Secondary | ICD-10-CM | POA: Diagnosis not present

## 2017-01-08 DIAGNOSIS — F900 Attention-deficit hyperactivity disorder, predominantly inattentive type: Secondary | ICD-10-CM | POA: Diagnosis not present

## 2017-01-12 ENCOUNTER — Encounter: Payer: Self-pay | Admitting: Physical Therapy

## 2017-01-12 ENCOUNTER — Encounter: Payer: BLUE CROSS/BLUE SHIELD | Admitting: Physical Therapy

## 2017-01-12 ENCOUNTER — Ambulatory Visit: Payer: BLUE CROSS/BLUE SHIELD | Admitting: Physical Therapy

## 2017-01-12 DIAGNOSIS — M25651 Stiffness of right hip, not elsewhere classified: Secondary | ICD-10-CM

## 2017-01-12 DIAGNOSIS — R600 Localized edema: Secondary | ICD-10-CM | POA: Diagnosis not present

## 2017-01-12 DIAGNOSIS — M25551 Pain in right hip: Secondary | ICD-10-CM

## 2017-01-12 DIAGNOSIS — R262 Difficulty in walking, not elsewhere classified: Secondary | ICD-10-CM | POA: Diagnosis not present

## 2017-01-12 NOTE — Therapy (Signed)
[place], [place], [address] Phone: [phone]   Fax:  [phone]  Physical Therapy Treatment  Patient Details  Name: Paul Ray MRN: [id number] Date of Birth: [birth year] Referring Provider: Dr Sallyanne Havers   Encounter Date: 01/12/2017      PT End of Session - 01/12/17 1131    Visit Number 7   Number of Visits 16   Date for PT Re-Evaluation 02/18/17   Authorization Type BCBS  Ded met   PT Start Time 1106   PT Stop Time 1201   PT Time Calculation (min) 55 min   Activity Tolerance Patient tolerated treatment well   Behavior During Therapy [facility] for tasks assessed/performed      Past Medical History:  Diagnosis Date  . Abscess of lung(513.0)    Right lower lobe  . ADD (attention deficit disorder)   . Anxiety   . Arthritis   . Bipolar depression ([place])   . Empyema lung ([place])   . Hemorrhoids   . Hepatitis C   . Pneumonia    last year  . Prostate abscess   . Prostate troubles     Past Surgical History:  Procedure Laterality Date  . ELBOW SURGERY  2005   Right  . FOOT NEUROMA SURGERY  2006   Left  . HIP RESURFACING     left hip at [facility] 01/2011  . LUMBAR LAMINECTOMY/DECOMPRESSION MICRODISCECTOMY  07/24/2011   Procedure: LUMBAR LAMINECTOMY/DECOMPRESSION MICRODISCECTOMY;  Surgeon: Eustace Moore;  Location: [place] [facility];  Service: Neurosurgery;  Laterality: Bilateral;  Bilateral  , Lumbar Three-Four, Lumbar Four-Five Decompressive Laminectomy Rm # 32  . LUNG SURGERY     for empyema  . SHOULDER ARTHROSCOPY Right 07/05/2013   Procedure: RIGHT SHOULDER ARTHROSCOPY WITH DEBRIDEMENT;  Surgeon: Mcarthur Rossetti, MD;  Location: [place];  Service: Orthopedics;  Laterality: Right;  . TENDON TRANSFER Left 01/26/2014   Procedure: LEFT THUMB TRAPEZIECTOMY KNOTTED TENDON INTRAPOSITION TRANSFER OF ABDUCTOR POLLICIS LONGUS TO [place];  Surgeon: Cammie Sickle, MD;  Location: [place];   Service: Orthopedics;  Laterality: Left;  . THORACOTOMY  November 14 2010   rt    There were no vitals filed for this visit.      Subjective Assessment - 01/12/17 1129    Subjective Patient reports his right lower extremity beame swollen on Sunday. It is better today. He feels his pain is about a 5/10 today.    Pertinent History elbow surgery 2005; shoulder scope 2014; lumbar decompression 2014    Limitations Standing;Walking;House hold activities;Lifting   How long can you sit comfortably? No limit    How long can you stand comfortably? < 5 minutes before pain increases    How long can you walk comfortably? 200-300 feet in the community before pain increases    Diagnostic tests Venus doppler 12/22/2016 per ED notes (-)    Patient Stated Goals to have less pain with walking    Currently in Pain? Yes   Pain Score 5    Pain Location Hip   Pain Orientation Right   Pain Descriptors / Indicators Aching   Pain Type Acute pain   Pain Onset More than a month ago   Pain Frequency Constant   Aggravating Factors  standing and walking    Pain Relieving Factors rest and ice    Effect of Pain on Daily Activities difficulty perfroming ADL's  [place] Adult PT Treatment/Exercise - 01/12/17 0001      Ambulation/Gait   Gait Comments reviewed gait with the patient. Patient did not use his cane inthe clinic      Lumbar Exercises: Seated   Other Seated Lumbar Exercises seated clam with yellow in limited range 2x10; Ball squeeze 2x10;      Knee/Hip Exercises: Aerobic   Nustep 5 min level 5      Knee/Hip Exercises: Machines for Strengthening   Total Gym Leg Press 2x10 40 lbs cuing for knee allignment      Knee/Hip Exercises: Standing   Heel Raises Limitations 2x15   Forward Step Up Limitations 2x10 4inch    Functional Squat Limitations --   Other Standing Knee Exercises marching 2x10      Knee/Hip Exercises: Seated   Long Arc Quad Limitations 2x15      Knee/Hip Exercises: Supine   Heel Slides Limitations 2x15 with leg off the table    Bridges Limitations 2x15   Other Supine Knee/Hip Exercises supine hip flexion 2x15   Other Supine Knee/Hip Exercises clam shell red 2x15; Ball squeeze x30     Modalities   Modalities Cryotherapy     Cryotherapy   Number Minutes Cryotherapy 15 Minutes   Cryotherapy Location Hip     Manual Therapy   Manual therapy comments Gentle Passive ROM into flexion ER and IR. Careful not to push into pain or end ranges                PT Education - 01/12/17 1131    Education provided Yes   Education Details continue with stretching and strengthening at home.    Person(s) Educated Patient   Methods Explanation;Demonstration;Tactile cues   Comprehension Verbalized understanding;Returned demonstration;Verbal cues required;Tactile cues required          PT Short Term Goals - 01/05/17 0903      PT SHORT TERM GOAL #1   Title Patient will increase gross right lower extremity strangth to 4+/5    Time 4   Period Weeks   Status On-going     PT SHORT TERM GOAL #2   Title Patient will increase passive right hip flexion by 10 degrees    Time 4   Period Weeks   Status On-going     PT SHORT TERM GOAL #3   Title Patient will be independent with initial HEP    Time 4   Period Weeks   Status On-going     PT SHORT TERM GOAL #4   Title Patient will report 2/10 pain at worst in right hip    Period Weeks   Status On-going           PT Long Term Goals - 12/25/16 6578      PT LONG TERM GOAL #1   Title Patient will ambualte 3000' with no device and miniaml pain and antalgic gait in order to ambualte in the community   Time 4   Period Weeks   Status New     PT LONG TERM GOAL #2   Title Patient will stand for 20 minutes without increased pain in order to perfrom ADL's    Time 8   Period Weeks   Status New     PT LONG TERM GOAL #3   Title Patient will increase active hip flexion to 90 degrees  in order to walk and sit comfortably    Time 8   Period Weeks   Status New  Plan - 01/12/17 1133    Clinical Impression Statement Patient tolerated treatment well. Therapy was able to add leg press in without significant pain. Therapy was also able to increase reps on most exercises.    History and Personal Factors relevant to plan of care: still having some    Clinical Presentation Evolving   Clinical Presentation due to: pain and stiffness   Clinical Decision Making Low   Rehab Potential Good   PT Frequency 2x / week   PT Duration 8 weeks   PT Treatment/Interventions ADLs/Self Care Home Management;Cryotherapy;Electrical Stimulation;Iontophoresis 40m/ml Dexamethasone;Traction;Moist Heat;Gait training;Stair training;Functional mobility training;Patient/family education;Therapeutic exercise;Therapeutic activities;Ultrasound;Manual techniques;Passive range of motion;Dry needling;Taping;Vasopneumatic Device   PT Next Visit Plan work on edema control, Consdeider light hip flexion stretching, attempted to give patient for home but as having some pain; add supine hip dexion in low range; consdier low rangf clam shells; Add nu-step;    PT Home Exercise Plan heel slide; glut set, quad set, LAQ    Consulted and Agree with Plan of Care Patient      Patient will benefit from skilled therapeutic intervention in order to improve the following deficits and impairments:  Abnormal gait, Decreased range of motion, Difficulty walking, Pain, Decreased endurance, Decreased knowledge of precautions, Decreased strength, Decreased mobility, Impaired sensation, Postural dysfunction  Visit Diagnosis: Pain in right hip  Stiffness of right hip, not elsewhere classified  Difficulty in walking, not elsewhere classified  Localized edema     Problem List Patient Active Problem List   Diagnosis Date Noted  . Impingement syndrome of right shoulder 07/05/2013  . Empyema lung ([place]   .  Abscess of lung(513.0)   . Bipolar depression ([place]   . ADD (attention deficit disorder)   . Hepatitis C   . Lung mass 10/22/2010  . Pleural effusion 10/15/2010    DCarney LivingPT DPT  01/12/2017, 1:29 PM  [facility] [address]Phone: [phone]  Fax:  [phone] Name: DDABID GODOWNMRN: [id number] of Birth: 501/11/1955

## 2017-01-14 ENCOUNTER — Ambulatory Visit: Payer: BLUE CROSS/BLUE SHIELD | Admitting: Physical Therapy

## 2017-01-14 ENCOUNTER — Encounter: Payer: BLUE CROSS/BLUE SHIELD | Admitting: Physical Therapy

## 2017-01-14 DIAGNOSIS — Z96641 Presence of right artificial hip joint: Secondary | ICD-10-CM | POA: Diagnosis not present

## 2017-01-15 ENCOUNTER — Ambulatory Visit: Payer: BLUE CROSS/BLUE SHIELD | Admitting: Physical Therapy

## 2017-01-15 DIAGNOSIS — R262 Difficulty in walking, not elsewhere classified: Secondary | ICD-10-CM | POA: Diagnosis not present

## 2017-01-15 DIAGNOSIS — M25551 Pain in right hip: Secondary | ICD-10-CM | POA: Diagnosis not present

## 2017-01-15 DIAGNOSIS — M25651 Stiffness of right hip, not elsewhere classified: Secondary | ICD-10-CM

## 2017-01-15 DIAGNOSIS — R600 Localized edema: Secondary | ICD-10-CM | POA: Diagnosis not present

## 2017-01-16 ENCOUNTER — Ambulatory Visit: Payer: BLUE CROSS/BLUE SHIELD | Admitting: Physical Therapy

## 2017-01-16 DIAGNOSIS — M25551 Pain in right hip: Secondary | ICD-10-CM | POA: Diagnosis not present

## 2017-01-16 DIAGNOSIS — R262 Difficulty in walking, not elsewhere classified: Secondary | ICD-10-CM | POA: Diagnosis not present

## 2017-01-16 DIAGNOSIS — M25651 Stiffness of right hip, not elsewhere classified: Secondary | ICD-10-CM | POA: Diagnosis not present

## 2017-01-16 DIAGNOSIS — R600 Localized edema: Secondary | ICD-10-CM

## 2017-01-16 NOTE — Therapy (Signed)
[place], [place], [address] Phone: [phone]   Fax:  [phone]  Physical Therapy Treatment  Patient Details  Name: Paul Ray MRN: [id number] Date of Birth: 31-Oct-1955 Referring Provider: Dr Sallyanne Havers   Encounter Date: 01/15/2017      PT End of Session - 01/16/17 0807    Visit Number 8   Number of Visits 16   Date for PT Re-Evaluation 02/18/17   Authorization Type BCBS  Ded met   PT Start Time 1507   PT Stop Time 1555   PT Time Calculation (min) 48 min   Activity Tolerance Patient tolerated treatment well   Behavior During Therapy [facility] for tasks assessed/performed      Past Medical History:  Diagnosis Date  . Abscess of lung(513.0)    Right lower lobe  . ADD (attention deficit disorder)   . Anxiety   . Arthritis   . Bipolar depression ([place])   . Empyema lung ([place])   . Hemorrhoids   . Hepatitis C   . Pneumonia    last year  . Prostate abscess   . Prostate troubles     Past Surgical History:  Procedure Laterality Date  . ELBOW SURGERY  2005   Right  . FOOT NEUROMA SURGERY  2006   Left  . HIP RESURFACING     left hip at [facility] 01/2011  . LUMBAR LAMINECTOMY/DECOMPRESSION MICRODISCECTOMY  07/24/2011   Procedure: LUMBAR LAMINECTOMY/DECOMPRESSION MICRODISCECTOMY;  Surgeon: Eustace Moore;  Location: [place] [facility];  Service: Neurosurgery;  Laterality: Bilateral;  Bilateral  , Lumbar Three-Four, Lumbar Four-Five Decompressive Laminectomy Rm # 32  . LUNG SURGERY     for empyema  . SHOULDER ARTHROSCOPY Right 07/05/2013   Procedure: RIGHT SHOULDER ARTHROSCOPY WITH DEBRIDEMENT;  Surgeon: Mcarthur Rossetti, MD;  Location: [place];  Service: Orthopedics;  Laterality: Right;  . TENDON TRANSFER Left 01/26/2014   Procedure: LEFT THUMB TRAPEZIECTOMY KNOTTED TENDON INTRAPOSITION TRANSFER OF ABDUCTOR POLLICIS LONGUS TO [place];  Surgeon: Cammie Sickle, MD;  Location: [place];   Service: Orthopedics;  Laterality: Left;  . THORACOTOMY  November 14 2010   rt    There were no vitals filed for this visit.      Subjective Assessment - 01/16/17 0803    Subjective Patient went to the MD who x-rayed the hip. He feels like the hip is doing well. He advised the patient to continue with therapy. He feels like he stepped into his truck weird and it has made his hip stiff.    Pertinent History elbow surgery 2005; shoulder scope 2014; lumbar decompression 2014    Limitations Standing;Walking;House hold activities;Lifting   How long can you sit comfortably? No limit    How long can you stand comfortably? < 5 minutes before pain increases    How long can you walk comfortably? 200-300 feet in the community before pain increases    Diagnostic tests Venus doppler 12/22/2016 per ED notes (-)    Patient Stated Goals to have less pain with walking    Currently in Pain? Yes   Pain Score 5    Pain Onset More than a month ago   Pain Frequency Constant   Aggravating Factors  standing and walking    Pain Relieving Factors rest and ice    Effect of Pain on Daily Activities difficulty perfroming ADL's  [place] Adult PT Treatment/Exercise - 01/16/17 0001      Ambulation/Gait   Gait Comments --     Lumbar Exercises: Seated   Other Seated Lumbar Exercises seated clam with yellow in limited range 2x10; Ball squeeze 2x10;      Knee/Hip Exercises: Aerobic   Nustep 5 min level 5      Knee/Hip Exercises: Machines for Strengthening   Total Gym Leg Press 2x10 40 lbs cuing for knee allignment      Knee/Hip Exercises: Standing   Heel Raises Limitations 2x15   Forward Step Up Limitations 2x10 4inch    Functional Squat Limitations 2x10   Other Standing Knee Exercises marching 2x10      Knee/Hip Exercises: Seated   Long Arc Quad Limitations 2x15     Knee/Hip Exercises: Supine   Heel Slides Limitations 2x15 with leg off the table    Bridges  Limitations 2x15   Straight Leg Raises Limitations 2x7   Other Supine Knee/Hip Exercises --   Other Supine Knee/Hip Exercises clam shell red 2x15; Ball squeeze x30     Modalities   Modalities Cryotherapy     Cryotherapy   Cryotherapy Location Hip     Manual Therapy   Manual therapy comments Gentle Passive ROM into flexion ER and IR. Careful not to push into pain or end ranges                PT Education - 01/16/17 0807    Education provided Yes   Education Details continue with stretching and strengthening at home    Person(s) Educated Patient   Methods Explanation;Demonstration;Tactile cues   Comprehension Verbalized understanding;Returned demonstration;Verbal cues required          PT Short Term Goals - 01/05/17 0903      PT SHORT TERM GOAL #1   Title Patient will increase gross right lower extremity strangth to 4+/5    Time 4   Period Weeks   Status On-going     PT SHORT TERM GOAL #2   Title Patient will increase passive right hip flexion by 10 degrees    Time 4   Period Weeks   Status On-going     PT SHORT TERM GOAL #3   Title Patient will be independent with initial HEP    Time 4   Period Weeks   Status On-going     PT SHORT TERM GOAL #4   Title Patient will report 2/10 pain at worst in right hip    Period Weeks   Status On-going           PT Long Term Goals - 12/25/16 9470      PT LONG TERM GOAL #1   Title Patient will ambualte 3000' with no device and miniaml pain and antalgic gait in order to ambualte in the community   Time 4   Period Weeks   Status New     PT LONG TERM GOAL #2   Title Patient will stand for 20 minutes without increased pain in order to perfrom ADL's    Time 8   Period Weeks   Status New     PT LONG TERM GOAL #3   Title Patient will increase active hip flexion to 90 degrees in order to walk and sit comfortably    Time 8   Period Weeks   Status New               Plan - 01/16/17 9628  Clinical  Impression Statement Patient was 7 minutes late to appintment. He tolerated treatment well. He flexion was stiff to begin but improved significant as the treatment went on. Therapy added straight leg raise. He had some difficulty.    Clinical Presentation Evolving   Clinical Decision Making Low   Rehab Potential Good   PT Frequency 2x / week   PT Duration 8 weeks   PT Treatment/Interventions ADLs/Self Care Home Management;Cryotherapy;Electrical Stimulation;Iontophoresis 10m/ml Dexamethasone;Traction;Moist Heat;Gait training;Stair training;Functional mobility training;Patient/family education;Therapeutic exercise;Therapeutic activities;Ultrasound;Manual techniques;Passive range of motion;Dry needling;Taping;Vasopneumatic Device   PT Next Visit Plan FOTO, assess gate in slow motion    PT Home Exercise Plan heel slide; glut set, quad set, LAQ    Consulted and Agree with Plan of Care Patient      Patient will benefit from skilled therapeutic intervention in order to improve the following deficits and impairments:  Abnormal gait, Decreased range of motion, Difficulty walking, Pain, Decreased endurance, Decreased knowledge of precautions, Decreased strength, Decreased mobility, Impaired sensation, Postural dysfunction  Visit Diagnosis: Pain in right hip  Stiffness of right hip, not elsewhere classified  Difficulty in walking, not elsewhere classified  Localized edema     Problem List Patient Active Problem List   Diagnosis Date Noted  . Impingement syndrome of right shoulder 07/05/2013  . Empyema lung ([place]   . Abscess of lung(513.0)   . Bipolar depression ([place]   . ADD (attention deficit disorder)   . Hepatitis C   . Lung mass 10/22/2010  . Pleural effusion 10/15/2010    DCarney Living6/22/2018, 8:18 AM  [facility]GNew [place] [address]Phone: [phone]  Fax:  [phone] Name: Paul PERNELLMRN:  [id number] of Birth: 01/21/1956/02/10

## 2017-01-19 ENCOUNTER — Encounter: Payer: Self-pay | Admitting: Physical Therapy

## 2017-01-19 ENCOUNTER — Ambulatory Visit: Payer: BLUE CROSS/BLUE SHIELD | Admitting: Physical Therapy

## 2017-01-19 DIAGNOSIS — R262 Difficulty in walking, not elsewhere classified: Secondary | ICD-10-CM

## 2017-01-19 DIAGNOSIS — M25551 Pain in right hip: Secondary | ICD-10-CM | POA: Diagnosis not present

## 2017-01-19 DIAGNOSIS — R600 Localized edema: Secondary | ICD-10-CM

## 2017-01-19 DIAGNOSIS — M25651 Stiffness of right hip, not elsewhere classified: Secondary | ICD-10-CM | POA: Diagnosis not present

## 2017-01-19 NOTE — Therapy (Signed)
[place], [place], [address] Phone: [phone]   Fax:  [phone]  Physical Therapy Treatment  Patient Details  Name: Paul Ray MRN: [id number] Date of Birth: 20-Nov-1955 Referring Provider: Dr Sallyanne Havers   Encounter Date: 01/16/2017      PT End of Session - 01/19/17 1250    Visit Number 9   Number of Visits 16   Date for PT Re-Evaluation 02/18/17   Authorization Type BCBS  Ded met   PT Start Time [phone]   PT Stop Time 1019   PT Time Calculation (min) 38 min   Activity Tolerance Patient tolerated treatment well   Behavior During Therapy [facility] for tasks assessed/performed      Past Medical History:  Diagnosis Date  . Abscess of lung(513.0)    Right lower lobe  . ADD (attention deficit disorder)   . Anxiety   . Arthritis   . Bipolar depression ([place])   . Empyema lung ([place])   . Hemorrhoids   . Hepatitis C   . Pneumonia    last year  . Prostate abscess   . Prostate troubles     Past Surgical History:  Procedure Laterality Date  . ELBOW SURGERY  2005   Right  . FOOT NEUROMA SURGERY  2006   Left  . HIP RESURFACING     left hip at [facility] 01/2011  . LUMBAR LAMINECTOMY/DECOMPRESSION MICRODISCECTOMY  07/24/2011   Procedure: LUMBAR LAMINECTOMY/DECOMPRESSION MICRODISCECTOMY;  Surgeon: Eustace Moore;  Location: [place] [facility];  Service: Neurosurgery;  Laterality: Bilateral;  Bilateral  , Lumbar Three-Four, Lumbar Four-Five Decompressive Laminectomy Rm # 32  . LUNG SURGERY     for empyema  . SHOULDER ARTHROSCOPY Right 07/05/2013   Procedure: RIGHT SHOULDER ARTHROSCOPY WITH DEBRIDEMENT;  Surgeon: Mcarthur Rossetti, MD;  Location: [place];  Service: Orthopedics;  Laterality: Right;  . TENDON TRANSFER Left 01/26/2014   Procedure: LEFT THUMB TRAPEZIECTOMY KNOTTED TENDON INTRAPOSITION TRANSFER OF ABDUCTOR POLLICIS LONGUS TO [place];  Surgeon: Cammie Sickle, MD;  Location: [place];   Service: Orthopedics;  Laterality: Left;  . THORACOTOMY  November 14 2010   rt    There were no vitals filed for this visit.      Subjective Assessment - 01/19/17 1245    Subjective Patient has no complaints at this time. He feels like it is a little stiff.    Pertinent History elbow surgery 2005; shoulder scope 2014; lumbar decompression 2014    Limitations Standing;Walking;House hold activities;Lifting   How long can you sit comfortably? No limit    How long can you stand comfortably? < 5 minutes before pain increases    How long can you walk comfortably? 200-300 feet in the community before pain increases    Diagnostic tests Venus doppler 12/22/2016 per ED notes (-)    Patient Stated Goals to have less pain with walking    Currently in Pain? Yes   Pain Score 3    Pain Location Hip   Pain Orientation Right   Pain Descriptors / Indicators Aching   Pain Type Acute pain   Pain Onset More than a month ago   Pain Frequency Constant   Aggravating Factors  standing and walking    Pain Relieving Factors rest and ice    Effect of Pain on Daily Activities difficulty perfroming ADL's  [place] Adult PT Treatment/Exercise - 01/19/17 1251      Ambulation/Gait   Gait Comments perfromed slow motion gait analysis of the patient. He tedns to cross mid line with the right lower extremity and is walking with a mild lateral whip. It is likely causeing his IT band pain. Therapy reviewed proper gait pattern with the patient.      Lumbar Exercises: Seated   Other Seated Lumbar Exercises seated clam with yellow in limited range 2x10; Ball squeeze 2x10;      Modalities   Modalities Cryotherapy     Cryotherapy   Cryotherapy Location Hip     Manual Therapy   Manual therapy comments Gentle Passive ROM into flexion ER and IR. Careful not to push into pain or end ranges                PT Education - 01/19/17 1249    Education provided Yes   Education  Details continue with stretching and strengthening    Person(s) Educated Patient   Methods Explanation;Demonstration;Tactile cues   Comprehension Verbalized understanding;Returned demonstration;Verbal cues required          PT Short Term Goals - 01/05/17 0903      PT SHORT TERM GOAL #1   Title Patient will increase gross right lower extremity strangth to 4+/5    Time 4   Period Weeks   Status On-going     PT SHORT TERM GOAL #2   Title Patient will increase passive right hip flexion by 10 degrees    Time 4   Period Weeks   Status On-going     PT SHORT TERM GOAL #3   Title Patient will be independent with initial HEP    Time 4   Period Weeks   Status On-going     PT SHORT TERM GOAL #4   Title Patient will report 2/10 pain at worst in right hip    Period Weeks   Status On-going           PT Long Term Goals - 12/25/16 1700      PT LONG TERM GOAL #1   Title Patient will ambualte 3000' with no device and miniaml pain and antalgic gait in order to ambualte in the community   Time 4   Period Weeks   Status New     PT LONG TERM GOAL #2   Title Patient will stand for 20 minutes without increased pain in order to perfrom ADL's    Time 8   Period Weeks   Status New     PT LONG TERM GOAL #3   Title Patient will increase active hip flexion to 90 degrees in order to walk and sit comfortably    Time 8   Period Weeks   Status New               Plan - 01/19/17 1300    Clinical Impression Statement Patient was 11 minutes late for his appointment. He tolerated treatment well. He was advised to not perseverate on his gait that with strengthening it will correct itself. He will work on it over the weekend.    Clinical Presentation Evolving   Clinical Decision Making Low   Rehab Potential Good   PT Frequency 2x / week   PT Duration 8 weeks   PT Treatment/Interventions ADLs/Self Care Home Management;Cryotherapy;Electrical Stimulation;Iontophoresis 78m/ml  Dexamethasone;Traction;Moist Heat;Gait training;Stair training;Functional mobility training;Patient/family education;Therapeutic exercise;Therapeutic activities;Ultrasound;Manual techniques;Passive range of motion;Dry needling;Taping;Vasopneumatic Device   PT Next  Visit Plan FOTO, assess gate in slow motion    PT Home Exercise Plan heel slide; glut set, quad set, LAQ    Consulted and Agree with Plan of Care Patient      Patient will benefit from skilled therapeutic intervention in order to improve the following deficits and impairments:  Abnormal gait, Decreased range of motion, Difficulty walking, Pain, Decreased endurance, Decreased knowledge of precautions, Decreased strength, Decreased mobility, Impaired sensation, Postural dysfunction  Visit Diagnosis: Pain in right hip  Stiffness of right hip, not elsewhere classified  Difficulty in walking, not elsewhere classified  Localized edema     Problem List Patient Active Problem List   Diagnosis Date Noted  . Impingement syndrome of right shoulder 07/05/2013  . Empyema lung ([place])   . Abscess of lung(513.0)   . Bipolar depression ([place])   . ADD (attention deficit disorder)   . Hepatitis C   . Lung mass 10/22/2010  . Pleural effusion 10/15/2010    Carney Living PT DPT  01/19/2017, 1:03 PM  [facility] [address] [place], [place], [address] Phone: [phone]   Fax:  [phone]  Name: CHARVIS LIGHTNER MRN: [id number] Date of Birth: [birth year]

## 2017-01-20 MED FILL — AMOXICILLIN 500 MG CAPSULE: 500 | 5 days supply | Qty: 20 | Fill #0

## 2017-01-20 NOTE — Therapy (Signed)
[place], [place], [address] Phone: [phone]   Fax:  [phone]  Physical Therapy Treatment  Patient Details  Name: Paul Ray MRN: [id number] Date of Birth: Jul 04, 1956 Referring Provider: Dr Sallyanne Havers   Encounter Date: 01/19/2017      PT End of Session - 01/20/17 1432    Visit Number 10   Number of Visits 16   Date for PT Re-Evaluation 02/18/17   Authorization Type BCBS  Ded met   PT Start Time 1100   PT Stop Time 1158   PT Time Calculation (min) 58 min   Activity Tolerance Patient tolerated treatment well   Behavior During Therapy [facility] for tasks assessed/performed      Past Medical History:  Diagnosis Date  . Abscess of lung(513.0)    Right lower lobe  . ADD (attention deficit disorder)   . Anxiety   . Arthritis   . Bipolar depression ([place])   . Empyema lung ([place])   . Hemorrhoids   . Hepatitis C   . Pneumonia    last year  . Prostate abscess   . Prostate troubles     Past Surgical History:  Procedure Laterality Date  . ELBOW SURGERY  2005   Right  . FOOT NEUROMA SURGERY  2006   Left  . HIP RESURFACING     left hip at [facility] 01/2011  . LUMBAR LAMINECTOMY/DECOMPRESSION MICRODISCECTOMY  07/24/2011   Procedure: LUMBAR LAMINECTOMY/DECOMPRESSION MICRODISCECTOMY;  Surgeon: Eustace Moore;  Location: [place] NEUR[facility]S;  Service: Neurosurgery;  Laterality: Bilateral;  Bilateral  , Lumbar Three-Four, Lumbar Four-Five Decompressive Laminectomy Rm # 32  . LUNG SURGERY     for empyema  . SHOULDER ARTHROSCOPY Right 07/05/2013   Procedure: RIGHT SHOULDER ARTHROSCOPY WITH DEBRIDEMENT;  Surgeon: Mcarthur Rossetti, MD;  Location: [place];  Service: Orthopedics;  Laterality: Right;  . TENDON TRANSFER Left 01/26/2014   Procedure: LEFT THUMB TRAPEZIECTOMY KNOTTED TENDON INTRAPOSITION TRANSFER OF ABDUCTOR POLLICIS LONGUS TO [place];  Surgeon: Cammie Sickle, MD;  Location: [place];   Service: Orthopedics;  Laterality: Left;  . THORACOTOMY  November 14 2010   rt    There were no vitals filed for this visit.      Subjective Assessment - 01/20/17 1341    Subjective Patient feel like he is more sore then he should be. He has been doing more activity though.    Pertinent History elbow surgery 2005; shoulder scope 2014; lumbar decompression 2014    Limitations Standing;Walking;House hold activities;Lifting   How long can you sit comfortably? No limit    How long can you stand comfortably? < 5 minutes before pain increases    How long can you walk comfortably? 200-300 feet in the community before pain increases    Diagnostic tests Venus doppler 12/22/2016 per ED notes (-)    Patient Stated Goals to have less pain with walking    Currently in Pain? Yes   Pain Score 4    Pain Location Hip   Pain Orientation Right   Pain Descriptors / Indicators Aching   Pain Type Acute pain   Pain Onset More than a month ago   Pain Frequency Constant   Aggravating Factors  standing and walking    Pain Relieving Factors rest and ice    Effect of Pain on Daily Activities difficulty perfroming ADL's  [facility] Adult PT Treatment/Exercise - 01/20/17 0001      Modalities   Modalities Cryotherapy     Cryotherapy   Number Minutes Cryotherapy 15 Minutes   Cryotherapy Location Hip   Type of Cryotherapy Ice pack     Manual Therapy   Manual therapy comments Gentle Passive ROM into flexion ER and IR. Careful not to push into pain or end ranges                PT Education - 01/20/17 1431    Education provided Yes   Education Details continue with stretching and strengthening    Person(s) Educated Patient   Methods Explanation;Demonstration;Tactile cues   Comprehension Verbalized understanding;Returned demonstration;Verbal cues required          PT Short Term Goals - 01/20/17 1437      PT SHORT TERM GOAL #1   Title Patient will  increase gross right lower extremity strangth to 4+/5    Time 4   Period Weeks   Status On-going     PT SHORT TERM GOAL #2   Title Patient will increase passive right hip flexion by 10 degrees    Time 4   Period Weeks   Status On-going     PT SHORT TERM GOAL #3   Title Patient will be independent with initial HEP    Time 4   Period Weeks   Status On-going     PT SHORT TERM GOAL #4   Title Patient will report 2/10 pain at worst in right hip    Time 4   Period Weeks   Status On-going           PT Long Term Goals - 12/25/16 5859      PT LONG TERM GOAL #1   Title Patient will ambualte 3000' with no device and miniaml pain and antalgic gait in order to ambualte in the community   Time 4   Period Weeks   Status New     PT LONG TERM GOAL #2   Title Patient will stand for 20 minutes without increased pain in order to perfrom ADL's    Time 8   Period Weeks   Status New     PT LONG TERM GOAL #3   Title Patient will increase active hip flexion to 90 degrees in order to walk and sit comfortably    Time 8   Period Weeks   Status New               Plan - 01/20/17 1433    Clinical Impression Statement Patient tolerated treatment well. Therapy continues to progress his strengthening. he was advised not to over-do his activity until he can walk better. he continues to have pain in his lateral leg along his IT-band.    History and Personal Factors relevant to plan of care: still having some    Clinical Presentation Evolving   Clinical Presentation due to: pain and stiffness    Clinical Decision Making Low   Rehab Potential Good   PT Frequency 2x / week   PT Duration 8 weeks   PT Treatment/Interventions ADLs/Self Care Home Management;Cryotherapy;Electrical Stimulation;Iontophoresis 66m/ml Dexamethasone;Traction;Moist Heat;Gait training;Stair training;Functional mobility training;Patient/family education;Therapeutic exercise;Therapeutic activities;Ultrasound;Manual  techniques;Passive range of motion;Dry needling;Taping;Vasopneumatic Device   PT Next Visit Plan FOTO, assess gate in slow motion    PT Home Exercise Plan heel slide; glut set, quad set, LAQ    Consulted and Agree with Plan of Care Patient  Patient will benefit from skilled therapeutic intervention in order to improve the following deficits and impairments:  Abnormal gait, Decreased range of motion, Difficulty walking, Pain, Decreased endurance, Decreased knowledge of precautions, Decreased strength, Decreased mobility, Impaired sensation, Postural dysfunction  Visit Diagnosis: Pain in right hip  Stiffness of right hip, not elsewhere classified  Difficulty in walking, not elsewhere classified  Localized edema     Problem List Patient Active Problem List   Diagnosis Date Noted  . Impingement syndrome of right shoulder 07/05/2013  . Empyema lung ([place])   . Abscess of lung(513.0)   . Bipolar depression ([place])   . ADD (attention deficit disorder)   . Hepatitis C   . Lung mass 10/22/2010  . Pleural effusion 10/15/2010    Carney Living PT DPT  01/20/2017, 2:40 PM  [facility] [address] [place], [place], [address] Phone: [phone]   Fax:  [phone]  Name: Paul Ray MRN: [id number] Date of Birth: 1955-09-04

## 2017-01-21 ENCOUNTER — Ambulatory Visit: Payer: BLUE CROSS/BLUE SHIELD | Admitting: Physical Therapy

## 2017-01-21 ENCOUNTER — Encounter: Payer: Self-pay | Admitting: Physical Therapy

## 2017-01-21 DIAGNOSIS — R262 Difficulty in walking, not elsewhere classified: Secondary | ICD-10-CM

## 2017-01-21 DIAGNOSIS — M25551 Pain in right hip: Secondary | ICD-10-CM | POA: Diagnosis not present

## 2017-01-21 DIAGNOSIS — R600 Localized edema: Secondary | ICD-10-CM

## 2017-01-21 DIAGNOSIS — M25651 Stiffness of right hip, not elsewhere classified: Secondary | ICD-10-CM

## 2017-01-22 NOTE — Therapy (Signed)
[place], [place], [address] Phone: [phone]   Fax:  [phone]  Physical Therapy Treatment  Patient Details  Name: Paul Ray MRN: [id number] Date of Birth: Jun 02, 1956 Referring Provider: Dr Sallyanne Havers   Encounter Date: 01/21/2017      PT End of Session - 01/21/17 1039    Visit Number 11   Number of Visits 16   Date for PT Re-Evaluation 02/18/17   Authorization Type BCBS  Ded met   PT Start Time 1020   PT Stop Time 1103   PT Time Calculation (min) 43 min   Activity Tolerance Patient tolerated treatment well   Behavior During Therapy [facility] for tasks assessed/performed      Past Medical History:  Diagnosis Date  . Abscess of lung(513.0)    Right lower lobe  . ADD (attention deficit disorder)   . Anxiety   . Arthritis   . Bipolar depression ([place])   . Empyema lung ([place])   . Hemorrhoids   . Hepatitis C   . Pneumonia    last year  . Prostate abscess   . Prostate troubles     Past Surgical History:  Procedure Laterality Date  . ELBOW SURGERY  2005   Right  . FOOT NEUROMA SURGERY  2006   Left  . HIP RESURFACING     left hip at [facility] 01/2011  . LUMBAR LAMINECTOMY/DECOMPRESSION MICRODISCECTOMY  07/24/2011   Procedure: LUMBAR LAMINECTOMY/DECOMPRESSION MICRODISCECTOMY;  Surgeon: Eustace Moore;  Location: [place] NEURO ORS;  Service: Neurosurgery;  Laterality: Bilateral;  Bilateral  , Lumbar Three-Four, Lumbar Four-Five Decompressive Laminectomy Rm # 32  . LUNG SURGERY     for empyema  . SHOULDER ARTHROSCOPY Right 07/05/2013   Procedure: RIGHT SHOULDER ARTHROSCOPY WITH DEBRIDEMENT;  Surgeon: Mcarthur Rossetti, MD;  Location: [place];  Service: Orthopedics;  Laterality: Right;  . TENDON TRANSFER Left 01/26/2014   Procedure: LEFT THUMB TRAPEZIECTOMY KNOTTED TENDON INTRAPOSITION TRANSFER OF ABDUCTOR POLLICIS LONGUS TO [place];  Surgeon: Cammie Sickle, MD;  Location: [place];   Service: Orthopedics;  Laterality: Left;  . THORACOTOMY  November 14 2010   rt    There were no vitals filed for this visit.      Subjective Assessment - 01/21/17 1026    Subjective Patient reports yesterday morning he was sore but overall he is doing OK today. His pain level is about a 4/10 in his anterior hip.    Pertinent History elbow surgery 2005; shoulder scope 2014; lumbar decompression 2014    Limitations Standing;Walking;House hold activities;Lifting   How long can you sit comfortably? No limit    How long can you stand comfortably? < 5 minutes before pain increases    How long can you walk comfortably? 200-300 feet in the community before pain increases    Diagnostic tests Venus doppler 12/22/2016 per ED notes (-)    Patient Stated Goals to have less pain with walking    Currently in Pain? Yes   Pain Score 4    Pain Location Hip   Pain Orientation Right   Pain Descriptors / Indicators Aching   Pain Type Acute pain   Pain Onset More than a month ago   Pain Frequency Constant   Aggravating Factors  standing and walking    Pain Relieving Factors rest and ice    Effect of Pain on Daily Activities difficulty perfroming ADL's  [facility] Adult PT Treatment/Exercise - 01/22/17 0001      Lumbar Exercises: Seated   Other Seated Lumbar Exercises seated clam with blue in limited range 2x10; Ball squeeze 2x10;      Knee/Hip Exercises: Machines for Strengthening   Cybex Leg Press 60 3x10 sled 8      Knee/Hip Exercises: Standing   Heel Raises Limitations 2x15 on hip machine    Forward Step Up Limitations 7 2x10    Functional Squat Limitations 7 2x10      Knee/Hip Exercises: Supine   Bridges Limitations 2x15   Straight Leg Raises Limitations 2x10      Modalities   Modalities Cryotherapy     Cryotherapy   Number Minutes Cryotherapy 15 Minutes   Cryotherapy Location Hip   Type of Cryotherapy Ice pack     Manual Therapy   Manual therapy  comments Gentle Passive ROM into flexion ER and IR. Careful not to push into pain or end ranges                PT Education - 01/21/17 1039    Education provided Yes   Education Details continue with strengthening    Person(s) Educated Patient   Methods Explanation;Demonstration;Tactile cues   Comprehension Verbalized understanding;Returned demonstration          PT Short Term Goals - 01/20/17 1437      PT SHORT TERM GOAL #1   Title Patient will increase gross right lower extremity strangth to 4+/5    Time 4   Period Weeks   Status On-going     PT SHORT TERM GOAL #2   Title Patient will increase passive right hip flexion by 10 degrees    Time 4   Period Weeks   Status On-going     PT SHORT TERM GOAL #3   Title Patient will be independent with initial HEP    Time 4   Period Weeks   Status On-going     PT SHORT TERM GOAL #4   Title Patient will report 2/10 pain at worst in right hip    Time 4   Period Weeks   Status On-going           PT Long Term Goals - 12/25/16 0762      PT LONG TERM GOAL #1   Title Patient will ambualte 3000' with no device and miniaml pain and antalgic gait in order to ambualte in the community   Time 4   Period Weeks   Status New     PT LONG TERM GOAL #2   Title Patient will stand for 20 minutes without increased pain in order to perfrom ADL's    Time 8   Period Weeks   Status New     PT LONG TERM GOAL #3   Title Patient will increase active hip flexion to 90 degrees in order to walk and sit comfortably    Time 8   Period Weeks   Status New               Plan - 01/21/17 1041    Clinical Impression Statement Patient continues to have some anterior soreness but he is still progressing. The pain level is slowly starting to drop down. Decreased right adduction with gait today.    History and Personal Factors relevant to plan of care: still have some tightness    Clinical Presentation Evolving   Clinical Decision  Making Low   Rehab Potential Good  PT Frequency 2x / week   PT Duration 8 weeks   PT Treatment/Interventions ADLs/Self Care Home Management;Cryotherapy;Electrical Stimulation;Iontophoresis 55m/ml Dexamethasone;Traction;Moist Heat;Gait training;Stair training;Functional mobility training;Patient/family education;Therapeutic exercise;Therapeutic activities;Ultrasound;Manual techniques;Passive range of motion;Dry needling;Taping;Vasopneumatic Device   PT Next Visit Plan FOTO, assess gate in slow motion    PT Home Exercise Plan heel slide; glut set, quad set, LAQ    Consulted and Agree with Plan of Care Patient      Patient will benefit from skilled therapeutic intervention in order to improve the following deficits and impairments:  Abnormal gait, Decreased range of motion, Difficulty walking, Pain, Decreased endurance, Decreased knowledge of precautions, Decreased strength, Decreased mobility, Impaired sensation, Postural dysfunction  Visit Diagnosis: Pain in right hip  Stiffness of right hip, not elsewhere classified  Difficulty in walking, not elsewhere classified  Localized edema     Problem List Patient Active Problem List   Diagnosis Date Noted  . Impingement syndrome of right shoulder 07/05/2013  . Empyema lung ([place]   . Abscess of lung(513.0)   . Bipolar depression ([place]   . ADD (attention deficit disorder)   . Hepatitis C   . Lung mass 10/22/2010  . Pleural effusion 10/15/2010    DCarney LivingPT DPT  01/22/2017, 8:13 AM  [facility] [address]Phone: [phone]  Fax:  [phone] Name: Paul DEREGOMRN: [id number] of Birth: 05/24/1956/04/05

## 2017-01-23 ENCOUNTER — Encounter: Payer: Self-pay | Admitting: Physical Therapy

## 2017-01-23 ENCOUNTER — Ambulatory Visit: Payer: BLUE CROSS/BLUE SHIELD | Admitting: Physical Therapy

## 2017-01-23 DIAGNOSIS — R262 Difficulty in walking, not elsewhere classified: Secondary | ICD-10-CM | POA: Diagnosis not present

## 2017-01-23 DIAGNOSIS — R600 Localized edema: Secondary | ICD-10-CM | POA: Diagnosis not present

## 2017-01-23 DIAGNOSIS — M25551 Pain in right hip: Secondary | ICD-10-CM | POA: Diagnosis not present

## 2017-01-23 DIAGNOSIS — M25651 Stiffness of right hip, not elsewhere classified: Secondary | ICD-10-CM | POA: Diagnosis not present

## 2017-01-23 NOTE — Therapy (Signed)
[place], [place], [address] Phone: [phone]   Fax:  [phone]  Physical Therapy Treatment  Patient Details  Name: Paul Ray MRN: [id number] Date of Birth: 08/01/1955 Referring Provider: Dr Sallyanne Havers   Encounter Date: 01/23/2017      PT End of Session - 01/23/17 1002    Visit Number 12   Number of Visits 16   Date for PT Re-Evaluation 02/18/17   Authorization Type BCBS  Ded met   PT Start Time 0934   PT Stop Time 1028   PT Time Calculation (min) 54 min   Activity Tolerance Patient tolerated treatment well   Behavior During Therapy [facility] for tasks assessed/performed      Past Medical History:  Diagnosis Date  . Abscess of lung(513.0)    Right lower lobe  . ADD (attention deficit disorder)   . Anxiety   . Arthritis   . Bipolar depression ([place])   . Empyema lung ([place])   . Hemorrhoids   . Hepatitis C   . Pneumonia    last year  . Prostate abscess   . Prostate troubles     Past Surgical History:  Procedure Laterality Date  . ELBOW SURGERY  2005   Right  . FOOT NEUROMA SURGERY  2006   Left  . HIP RESURFACING     left hip at [facility] 01/2011  . LUMBAR LAMINECTOMY/DECOMPRESSION MICRODISCECTOMY  07/24/2011   Procedure: LUMBAR LAMINECTOMY/DECOMPRESSION MICRODISCECTOMY;  Surgeon: Eustace Moore;  Location: [place] NEUR[facility];  Service: Neurosurgery;  Laterality: Bilateral;  Bilateral  , Lumbar Three-Four, Lumbar Four-Five Decompressive Laminectomy Rm # 32  . LUNG SURGERY     for empyema  . SHOULDER ARTHROSCOPY Right 07/05/2013   Procedure: RIGHT SHOULDER ARTHROSCOPY WITH DEBRIDEMENT;  Surgeon: Mcarthur Rossetti, MD;  Location: [place];  Service: Orthopedics;  Laterality: Right;  . TENDON TRANSFER Left 01/26/2014   Procedure: LEFT THUMB TRAPEZIECTOMY KNOTTED TENDON INTRAPOSITION TRANSFER OF ABDUCTOR POLLICIS LONGUS TO [place];  Surgeon: Cammie Sickle, MD;  Location: [place];   Service: Orthopedics;  Laterality: Left;  . THORACOTOMY  November 14 2010   rt    There were no vitals filed for this visit.      Subjective Assessment - 01/23/17 0940    Subjective Patient reports he hashad some pain over the last few days. He has been doing a lot of standing.    Pertinent History elbow surgery 2005; shoulder scope 2014; lumbar decompression 2014    Limitations Standing;Walking;House hold activities;Lifting   How long can you sit comfortably? No limit    How long can you stand comfortably? < 5 minutes before pain increases    How long can you walk comfortably? 200-300 feet in the community before pain increases    Diagnostic tests Venus doppler 12/22/2016 per ED notes (-)    Currently in Pain? Yes   Pain Score 5    Pain Location Hip   Pain Orientation Right   Pain Descriptors / Indicators Aching   Pain Type Acute pain   Pain Onset More than a month ago   Pain Frequency Constant   Aggravating Factors  standing and walking    Pain Relieving Factors rest and ice   Effect of Pain on Daily Activities difficulty perfroming ADL's                         [facility] Adult PT  Treatment/Exercise - 01/23/17 0001      Knee/Hip Exercises: Machines for Strengthening   Cybex Leg Press sled 7 2x10 40 lb 2x10 60 lb      Knee/Hip Exercises: Standing   Heel Raises Limitations 2x15 on hip machine    Forward Step Up Limitations 8 2x10    Functional Squat Limitations 2x10 with cuing      Knee/Hip Exercises: Supine   Bridges Limitations 2x15   Straight Leg Raises Limitations 2x10    Other Supine Knee/Hip Exercises clam shell blue 2x10     Modalities   Modalities Cryotherapy     Cryotherapy   Number Minutes Cryotherapy 15 Minutes   Cryotherapy Location Hip   Type of Cryotherapy Ice pack     Manual Therapy   Manual therapy comments Gentle Passive ROM into flexion ER and IR. Careful not to push into pain or end ranges; IT band roll out                  PT Education - 01/23/17 1002    Education provided Yes   Education Details symptom management and healing education    Person(s) Educated Patient   Methods Explanation;Demonstration;Tactile cues   Comprehension Verbalized understanding;Returned demonstration          PT Short Term Goals - 01/20/17 1437      PT SHORT TERM GOAL #1   Title Patient will increase gross right lower extremity strangth to 4+/5    Time 4   Period Weeks   Status On-going     PT SHORT TERM GOAL #2   Title Patient will increase passive right hip flexion by 10 degrees    Time 4   Period Weeks   Status On-going     PT SHORT TERM GOAL #3   Title Patient will be independent with initial HEP    Time 4   Period Weeks   Status On-going     PT SHORT TERM GOAL #4   Title Patient will report 2/10 pain at worst in right hip    Time 4   Period Weeks   Status On-going           PT Long Term Goals - 12/25/16 0100      PT LONG TERM GOAL #1   Title Patient will ambualte 3000' with no device and miniaml pain and antalgic gait in order to ambualte in the community   Time 4   Period Weeks   Status New     PT LONG TERM GOAL #2   Title Patient will stand for 20 minutes without increased pain in order to perfrom ADL's    Time 8   Period Weeks   Status New     PT LONG TERM GOAL #3   Title Patient will increase active hip flexion to 90 degrees in order to walk and sit comfortably    Time 8   Period Weeks   Status New               Plan - 01/23/17 1004    Clinical Impression Statement Therapy continues to make good progress. His passive flexion was measured sat 100 degrees. He is still concerned hie is having pain. He was educated that he can be expected to have pain at this point ecspeccially if he is up on his feet for long periods of time. He is progressing well with exercises. he was able to increase his step height.    History and  Personal Factors relevant to plan of care: still have som  tightness    Clinical Presentation Evolving   Clinical Decision Making Low   PT Frequency 2x / week   PT Duration 8 weeks   PT Treatment/Interventions ADLs/Self Care Home Management;Cryotherapy;Electrical Stimulation;Iontophoresis 65m/ml Dexamethasone;Traction;Moist Heat;Gait training;Stair training;Functional mobility training;Patient/family education;Therapeutic exercise;Therapeutic activities;Ultrasound;Manual techniques;Passive range of motion;Dry needling;Taping;Vasopneumatic Device   PT Next Visit Plan FOTO, assess gate in slow motion    PT Home Exercise Plan heel slide; glut set, quad set, LAQ    Consulted and Agree with Plan of Care Patient      Patient will benefit from skilled therapeutic intervention in order to improve the following deficits and impairments:  Abnormal gait, Decreased range of motion, Difficulty walking, Pain, Decreased endurance, Decreased knowledge of precautions, Decreased strength, Decreased mobility, Impaired sensation, Postural dysfunction  Visit Diagnosis: Pain in right hip  Stiffness of right hip, not elsewhere classified  Difficulty in walking, not elsewhere classified  Localized edema     Problem List Patient Active Problem List   Diagnosis Date Noted  . Impingement syndrome of right shoulder 07/05/2013  . [place]yema lung ([place]   . Abscess of lung(513.0)   . Bipolar depression ([place]   . ADD (attention deficit disorder)   . Hepatitis C   . Lung mass 10/22/2010  . Pleural effusion 10/15/2010    Paul LivingPT DPT  01/23/2017, 10:49 AM  [facility] [place] [address]Phone: [phone]  Fax:  [phone] Name: Paul LAMBAMRN: [id number] of Birth: 5April 20, 1957

## 2017-01-26 ENCOUNTER — Ambulatory Visit: Payer: BLUE CROSS/BLUE SHIELD | Attending: Orthopedic Surgery | Admitting: Physical Therapy

## 2017-01-26 ENCOUNTER — Encounter: Payer: Self-pay | Admitting: Physical Therapy

## 2017-01-26 DIAGNOSIS — R600 Localized edema: Secondary | ICD-10-CM | POA: Diagnosis not present

## 2017-01-26 DIAGNOSIS — R262 Difficulty in walking, not elsewhere classified: Secondary | ICD-10-CM | POA: Diagnosis not present

## 2017-01-26 DIAGNOSIS — M25651 Stiffness of right hip, not elsewhere classified: Secondary | ICD-10-CM | POA: Diagnosis not present

## 2017-01-26 DIAGNOSIS — M25551 Pain in right hip: Secondary | ICD-10-CM | POA: Diagnosis not present

## 2017-01-26 NOTE — Therapy (Signed)
[place], [place], [address] Phone: [phone]   Fax:  [phone]  Physical Therapy Treatment  Patient Details  Name: Paul Ray MRN: [id number] Date of Birth: 17-Mar-1956 Referring Provider: Dr Sallyanne Havers   Encounter Date: 01/26/2017      PT End of Session - 01/26/17 0909    Visit Number 13   Number of Visits 16   Date for PT Re-Evaluation 02/18/17   Authorization Type BCBS  Ded met   PT Start Time 0856   PT Stop Time 0945   PT Time Calculation (min) 49 min   Activity Tolerance Patient tolerated treatment well      Past Medical History:  Diagnosis Date  . Abscess of lung(513.0)    Right lower lobe  . ADD (attention deficit disorder)   . Anxiety   . Arthritis   . Bipolar depression ([place])   . Empyema lung ([place])   . Hemorrhoids   . Hepatitis C   . Pneumonia    last year  . Prostate abscess   . Prostate troubles     Past Surgical History:  Procedure Laterality Date  . ELBOW SURGERY  2005   Right  . FOOT NEUROMA SURGERY  2006   Left  . HIP RESURFACING     left hip at [facility] 01/2011  . LUMBAR LAMINECTOMY/DECOMPRESSION MICRODISCECTOMY  07/24/2011   Procedure: LUMBAR LAMINECTOMY/DECOMPRESSION MICRODISCECTOMY;  Surgeon: Eustace Moore;  Location: [place] [facility];  Service: Neurosurgery;  Laterality: Bilateral;  Bilateral  , Lumbar Three-Four, Lumbar Four-Five Decompressive Laminectomy Rm # 32  . LUNG SURGERY     for empyema  . SHOULDER ARTHROSCOPY Right 07/05/2013   Procedure: RIGHT SHOULDER ARTHROSCOPY WITH DEBRIDEMENT;  Surgeon: Mcarthur Rossetti, MD;  Location: [place];  Service: Orthopedics;  Laterality: Right;  . TENDON TRANSFER Left 01/26/2014   Procedure: LEFT THUMB TRAPEZIECTOMY KNOTTED TENDON INTRAPOSITION TRANSFER OF ABDUCTOR POLLICIS LONGUS TO [place];  Surgeon: Cammie Sickle, MD;  Location: [place];  Service: Orthopedics;  Laterality: Left;  . THORACOTOMY   November 14 2010   rt    There were no vitals filed for this visit.      Subjective Assessment - 01/26/17 0859    Subjective Patient feels like his hip is sore. He feels stiff this morning. He has been doing his exercises.    Pertinent History elbow surgery 2005; shoulder scope 2014; lumbar decompression 2014    Limitations Standing;Walking;House hold activities;Lifting   How long can you sit comfortably? No limit    How long can you stand comfortably? < 5 minutes before pain increases    How long can you walk comfortably? 200-300 feet in the community before pain increases    Diagnostic tests Venus doppler 12/22/2016 per ED notes (-)    Patient Stated Goals to have less pain with walking    Currently in Pain? Yes   Pain Score 5    Pain Location Hip   Pain Orientation Right   Pain Descriptors / Indicators Aching   Pain Type Acute pain   Pain Onset More than a month ago   Pain Frequency Constant   Aggravating Factors  standing and walking    Pain Relieving Factors rest    Effect of Pain on Daily Activities difficulty perfroming ADL's                          [place]  Adult PT Treatment/Exercise - 01/26/17 0001      Knee/Hip Exercises: Machines for Strengthening   Cybex Leg Press sled 8 2x10 60 lb 2x10 80 lb      Knee/Hip Exercises: Standing   Heel Raises Limitations 2x15 on hip machine    Forward Step Up Limitations 8 2x10    Functional Squat Limitations 2x10 with cuing; consistently had popping      Knee/Hip Exercises: Supine   Bridges Limitations 2x15   Straight Leg Raises Limitations 2x10    Other Supine Knee/Hip Exercises clam shell blue 2x10     Modalities   Modalities Cryotherapy     Cryotherapy   Number Minutes Cryotherapy 15 Minutes   Cryotherapy Location Hip   Type of Cryotherapy Ice pack     Manual Therapy   Manual therapy comments Gentle Passive ROM into flexion ER and IR. Careful not to push into pain or end ranges; IT band roll out                  PT Education - 01/26/17 0902    Education provided Yes   Education Details continue to let the leg heal   Person(s) Educated Patient   Methods Explanation;Demonstration;Tactile cues   Comprehension Verbalized understanding          PT Short Term Goals - 01/20/17 1437      PT SHORT TERM GOAL #1   Title Patient will increase gross right lower extremity strangth to 4+/5    Time 4   Period Weeks   Status On-going     PT SHORT TERM GOAL #2   Title Patient will increase passive right hip flexion by 10 degrees    Time 4   Period Weeks   Status On-going     PT SHORT TERM GOAL #3   Title Patient will be independent with initial HEP    Time 4   Period Weeks   Status On-going     PT SHORT TERM GOAL #4   Title Patient will report 2/10 pain at worst in right hip    Time 4   Period Weeks   Status On-going           PT Long Term Goals - 12/25/16 3500      PT LONG TERM GOAL #1   Title Patient will ambualte 3000' with no device and miniaml pain and antalgic gait in order to ambualte in the community   Time 4   Period Weeks   Status New     PT LONG TERM GOAL #2   Title Patient will stand for 20 minutes without increased pain in order to perfrom ADL's    Time 8   Period Weeks   Status New     PT LONG TERM GOAL #3   Title Patient will increase active hip flexion to 90 degrees in order to walk and sit comfortably    Time 8   Period Weeks   Status New               Plan - 01/26/17 0910    Clinical Impression Statement Patient was 12 minutes late for his appointment. He reports some popping at end range at times. He coninues to tolerate exercises well. Therapy will continue to progress him as tolerated.    Clinical Presentation Evolving   Clinical Decision Making Low   Rehab Potential Good   PT Frequency 2x / week   PT Duration 8 weeks   PT  Treatment/Interventions ADLs/Self Care Home Management;Cryotherapy;Electrical  Stimulation;Iontophoresis 73m/ml Dexamethasone;Traction;Moist Heat;Gait training;Stair training;Functional mobility training;Patient/family education;Therapeutic exercise;Therapeutic activities;Ultrasound;Manual techniques;Passive range of motion;Dry needling;Taping;Vasopneumatic Device   PT Next Visit Plan assess gait in slow mo: continue with exercises    PT Home Exercise Plan heel slide; glut set, quad set, LAQ    Consulted and Agree with Plan of Care Patient      Patient will benefit from skilled therapeutic intervention in order to improve the following deficits and impairments:  Abnormal gait, Decreased range of motion, Difficulty walking, Pain, Decreased endurance, Decreased knowledge of precautions, Decreased strength, Decreased mobility, Impaired sensation, Postural dysfunction  Visit Diagnosis: Pain in right hip  Stiffness of right hip, not elsewhere classified  Difficulty in walking, not elsewhere classified  Localized edema     Problem List Patient Active Problem List   Diagnosis Date Noted  . Impingement syndrome of right shoulder 07/05/2013  . Empyema lung ([place]   . Abscess of lung(513.0)   . Bipolar depression ([place]   . ADD (attention deficit disorder)   . Hepatitis C   . Lung mass 10/22/2010  . Pleural effusion 10/15/2010    DCarney Living7/08/2016, 9:51 AM  [facility] [address]Phone: [phone]  Fax:  [phone] Name: DNIQUAN CHARNLEYMRN: [id number] of Birth: 509-23-1957

## 2017-01-27 DIAGNOSIS — G894 Chronic pain syndrome: Secondary | ICD-10-CM | POA: Diagnosis not present

## 2017-01-27 DIAGNOSIS — M545 Low back pain: Secondary | ICD-10-CM | POA: Diagnosis not present

## 2017-01-27 DIAGNOSIS — M25551 Pain in right hip: Secondary | ICD-10-CM | POA: Diagnosis not present

## 2017-01-27 DIAGNOSIS — M25511 Pain in right shoulder: Secondary | ICD-10-CM | POA: Diagnosis not present

## 2017-01-27 MED FILL — MORPHINE SULF 60 MG TAB SA: 60 | 30 days supply | Qty: 60 | Fill #0

## 2017-01-27 MED FILL — HYDROmorphone HCL 4 MG TABS: 4 | 30 days supply | Qty: 180 | Fill #0

## 2017-01-29 ENCOUNTER — Encounter: Payer: Self-pay | Admitting: Physical Therapy

## 2017-01-29 ENCOUNTER — Ambulatory Visit: Payer: BLUE CROSS/BLUE SHIELD | Admitting: Physical Therapy

## 2017-01-29 DIAGNOSIS — R600 Localized edema: Secondary | ICD-10-CM | POA: Diagnosis not present

## 2017-01-29 DIAGNOSIS — M25551 Pain in right hip: Secondary | ICD-10-CM | POA: Diagnosis not present

## 2017-01-29 DIAGNOSIS — M25651 Stiffness of right hip, not elsewhere classified: Secondary | ICD-10-CM

## 2017-01-29 DIAGNOSIS — R262 Difficulty in walking, not elsewhere classified: Secondary | ICD-10-CM | POA: Diagnosis not present

## 2017-01-29 NOTE — Therapy (Signed)
[place], [place], [address] Phone: [phone]   Fax:  [phone]  Physical Therapy Treatment  Patient Details  Name: Paul Ray MRN: [id number] Date of Birth: Jan 19, 1956 Referring Provider: Dr Sallyanne Havers   Encounter Date: 01/29/2017      PT End of Session - 01/29/17 0800    Visit Number 14   Number of Visits 16   Date for PT Re-Evaluation 02/18/17   Authorization Type BCBS  Ded met   PT Start Time 0756   PT Stop Time 0858   PT Time Calculation (min) 62 min   Activity Tolerance Patient tolerated treatment well   Behavior During Therapy [facility] for tasks assessed/performed      Past Medical History:  Diagnosis Date  . Abscess of lung(513.0)    Right lower lobe  . ADD (attention deficit disorder)   . Anxiety   . Arthritis   . Bipolar depression ([place])   . Empyema lung ([place])   . Hemorrhoids   . Hepatitis C   . Pneumonia    last year  . Prostate abscess   . Prostate troubles     Past Surgical History:  Procedure Laterality Date  . ELBOW SURGERY  2005   Right  . FOOT NEUROMA SURGERY  2006   Left  . HIP RESURFACING     left hip at [facility] 01/2011  . LUMBAR LAMINECTOMY/DECOMPRESSION MICRODISCECTOMY  07/24/2011   Procedure: LUMBAR LAMINECTOMY/DECOMPRESSION MICRODISCECTOMY;  Surgeon: Eustace Moore;  Location: [place] [facility];  Service: Neurosurgery;  Laterality: Bilateral;  Bilateral  , Lumbar Three-Four, Lumbar Four-Five Decompressive Laminectomy Rm # 32  . LUNG SURGERY     for empyema  . SHOULDER ARTHROSCOPY Right 07/05/2013   Procedure: RIGHT SHOULDER ARTHROSCOPY WITH DEBRIDEMENT;  Surgeon: Mcarthur Rossetti, MD;  Location: [place];  Service: Orthopedics;  Laterality: Right;  . TENDON TRANSFER Left 01/26/2014   Procedure: LEFT THUMB TRAPEZIECTOMY KNOTTED TENDON INTRAPOSITION TRANSFER OF ABDUCTOR POLLICIS LONGUS TO [place];  Surgeon: Cammie Sickle, MD;  Location: [place];   Service: Orthopedics;  Laterality: Left;  . THORACOTOMY  November 14 2010   rt    There were no vitals filed for this visit.      Subjective Assessment - 01/29/17 0759    Subjective Patient feels like he is still sore and stiff. He is stiffer in the mornings.    Pertinent History elbow surgery 2005; shoulder scope 2014; lumbar decompression 2014    Limitations Standing;Walking;House hold activities;Lifting   How long can you sit comfortably? No limit    Currently in Pain? Yes   Pain Score 5    Pain Location Hip   Pain Orientation Right   Pain Descriptors / Indicators Aching   Pain Type Acute pain   Pain Onset More than a month ago   Pain Frequency Constant   Aggravating Factors  standing and walking    Pain Relieving Factors rest    Effect of Pain on Daily Activities difficulty perfroming ADL''s                          [facility] Adult PT Treatment/Exercise - 01/29/17 0001      Knee/Hip Exercises: Machines for Strengthening   Cybex Leg Press sled 8 2x10 60 lb 2x10 80 lb      Knee/Hip Exercises: Standing   Heel Raises Limitations 2x15 on hip machine    Lateral  Step Up Limitations 8 2x10   Forward Step Up Limitations 8 2x10    Functional Squat Limitations 2x10 with cuing; consistently had popping      Knee/Hip Exercises: Supine   Bridges Limitations 2x15   Straight Leg Raises Limitations 2x10    Other Supine Knee/Hip Exercises clam shell blue 2x10     Modalities   Modalities Cryotherapy     Cryotherapy   Number Minutes Cryotherapy 15 Minutes   Cryotherapy Location Hip   Type of Cryotherapy Ice pack     Manual Therapy   Manual therapy comments Gentle Passive ROM into flexion ER and IR. Careful not to push into pain or end ranges; IT band roll out                 PT Education - 01/29/17 1243    Education provided Yes   Education Details continue to work on strengthening and be less agressive with the stretching    Person(s) Educated Patient    Methods Explanation;Demonstration;Tactile cues   Comprehension Verbalized understanding          PT Short Term Goals - 01/29/17 0834      PT SHORT TERM GOAL #1   Title Patient will increase gross right lower extremity strangth to 4+/5    Time 4   Period Weeks   Status On-going     PT SHORT TERM GOAL #2   Title Patient will increase passive right hip flexion by 10 degrees    Time 4   Period Weeks   Status On-going     PT SHORT TERM GOAL #3   Title Patient will be independent with initial HEP    Time 4   Period Weeks   Status On-going     PT SHORT TERM GOAL #4   Title Patient will report 2/10 pain at worst in right hip    Time 4   Period Weeks   Status On-going           PT Long Term Goals - 12/25/16 6213      PT LONG TERM GOAL #1   Title Patient will ambualte 3000' with no device and miniaml pain and antalgic gait in order to ambualte in the community   Time 4   Period Weeks   Status New     PT LONG TERM GOAL #2   Title Patient will stand for 20 minutes without increased pain in order to perfrom ADL's    Time 8   Period Weeks   Status New     PT LONG TERM GOAL #3   Title Patient will increase active hip flexion to 90 degrees in order to walk and sit comfortably    Time 8   Period Weeks   Status New               Plan - 01/29/17 0865    Clinical Impression Statement Patient had increased sorneess today with his exercises. He is also having ERP with hop flexion. He was strongy advised to to push his end ranges at this time if it is sore. He has enough range to be functional. He does continue to have soreness.    History and Personal Factors relevant to plan of care: continues to have flutuating pain and stiffness    Clinical Presentation Evolving   Clinical Presentation due to: pain and stiffness    Clinical Decision Making Low   Rehab Potential Good   PT Frequency 2x / week  PT Duration 8 weeks   PT Treatment/Interventions ADLs/Self Care Home  Management;Cryotherapy;Electrical Stimulation;Iontophoresis 53m/ml Dexamethasone;Traction;Moist Heat;Gait training;Stair training;Functional mobility training;Patient/family education;Therapeutic exercise;Therapeutic activities;Ultrasound;Manual techniques;Passive range of motion;Dry needling;Taping;Vasopneumatic Device   PT Next Visit Plan assess gait in slow mo: continue with exercises    PT Home Exercise Plan heel slide; glut set, quad set, LAQ    Consulted and Agree with Plan of Care Patient      Patient will benefit from skilled therapeutic intervention in order to improve the following deficits and impairments:  Abnormal gait, Decreased range of motion, Difficulty walking, Pain, Decreased endurance, Decreased knowledge of precautions, Decreased strength, Decreased mobility, Impaired sensation, Postural dysfunction  Visit Diagnosis: Pain in right hip  Stiffness of right hip, not elsewhere classified  Difficulty in walking, not elsewhere classified  Localized edema     Problem List Patient Active Problem List   Diagnosis Date Noted  . Impingement syndrome of right shoulder 07/05/2013  . Empyema lung ([place]   . Abscess of lung(513.0)   . Bipolar depression ([place]   . ADD (attention deficit disorder)   . Hepatitis C   . Lung mass 10/22/2010  . Pleural effusion 10/15/2010    DCarney Living PT DPT  01/29/2017, 12:46 PM  [facility]G[place] [address]Phone: [phone]  Fax:  [phone] Name: Paul FRISINAMRN: [id number] of Birth: 10/24/1955/02/03

## 2017-01-30 ENCOUNTER — Ambulatory Visit: Payer: BLUE CROSS/BLUE SHIELD | Admitting: Physical Therapy

## 2017-01-30 DIAGNOSIS — R262 Difficulty in walking, not elsewhere classified: Secondary | ICD-10-CM

## 2017-01-30 DIAGNOSIS — M25651 Stiffness of right hip, not elsewhere classified: Secondary | ICD-10-CM

## 2017-01-30 DIAGNOSIS — M25551 Pain in right hip: Secondary | ICD-10-CM | POA: Diagnosis not present

## 2017-01-30 DIAGNOSIS — R600 Localized edema: Secondary | ICD-10-CM | POA: Diagnosis not present

## 2017-02-01 ENCOUNTER — Encounter: Payer: Self-pay | Admitting: Physical Therapy

## 2017-02-01 NOTE — Therapy (Signed)
[place], [place], [address] Phone: [phone]   Fax:  [phone]  Physical Therapy Treatment  Patient Details  Name: Paul Ray MRN: [id number] Date of Birth: 01-Mar-1956 Referring Provider: Dr Sallyanne Havers   Encounter Date: 01/30/2017      PT End of Session - 02/01/17 2049    Visit Number 15   Number of Visits 30   Date for PT Re-Evaluation 03/29/17   Authorization Type BCBS  Ded met   PT Start Time 0800   PT Stop Time 0900   PT Time Calculation (min) 60 min   Activity Tolerance Patient tolerated treatment well   Behavior During Therapy [facility] for tasks assessed/performed      Past Medical History:  Diagnosis Date  . Abscess of lung(513.0)    Right lower lobe  . ADD (attention deficit disorder)   . Anxiety   . Arthritis   . Bipolar depression ([place])   . Empyema lung ([place])   . Hemorrhoids   . Hepatitis C   . Pneumonia    last year  . Prostate abscess   . Prostate troubles     Past Surgical History:  Procedure Laterality Date  . ELBOW SURGERY  2005   Right  . FOOT NEUROMA SURGERY  2006   Left  . HIP RESURFACING     left hip at [facility] 01/2011  . LUMBAR LAMINECTOMY/DECOMPRESSION MICRODISCECTOMY  07/24/2011   Procedure: LUMBAR LAMINECTOMY/DECOMPRESSION MICRODISCECTOMY;  Surgeon: Eustace Moore;  Location: [place] [facility];  Service: Neurosurgery;  Laterality: Bilateral;  Bilateral  , Lumbar Three-Four, Lumbar Four-Five Decompressive Laminectomy Rm # 32  . LUNG SURGERY     for empyema  . SHOULDER ARTHROSCOPY Right 07/05/2013   Procedure: RIGHT SHOULDER ARTHROSCOPY WITH DEBRIDEMENT;  Surgeon: Mcarthur Rossetti, MD;  Location: [place];  Service: Orthopedics;  Laterality: Right;  . TENDON TRANSFER Left 01/26/2014   Procedure: LEFT THUMB TRAPEZIECTOMY KNOTTED TENDON INTRAPOSITION TRANSFER OF ABDUCTOR POLLICIS LONGUS TO [place];  Surgeon: Cammie Sickle, MD;  Location: [place];   Service: Orthopedics;  Laterality: Left;  . THORACOTOMY  November 14 2010   rt    There were no vitals filed for this visit.      Subjective Assessment - 02/01/17 2046    Subjective Patient continues to report soreness in his hip. He also continus to be stiff.    Pertinent History elbow surgery 2005; shoulder scope 2014; lumbar decompression 2014    Limitations Standing;Walking;House hold activities;Lifting   How long can you sit comfortably? No limit    How long can you stand comfortably? < 5 minutes before pain increases    How long can you walk comfortably? 200-300 feet in the community before pain increases    Diagnostic tests Venus doppler 12/22/2016 per ED notes (-)    Patient Stated Goals to have less pain with walking    Currently in Pain? Yes   Pain Score 5    Pain Location Hip   Pain Orientation Right   Pain Descriptors / Indicators Aching   Pain Type Acute pain   Pain Onset More than a month ago   Pain Frequency Constant   Aggravating Factors  standing and walking    Pain Relieving Factors rest    Effect of Pain on Daily Activities difficulty performing ADL's            [facility] PT Assessment - 02/01/17 0001  AROM   Right Hip Flexion 95     Strength   Right Hip Flexion 3+/5   Right Hip Extension 3+/5   Right Hip ABduction 4/5   Right Hip ADduction 3+/5                     [facility] Adult PT Treatment/Exercise - 02/01/17 0001      Knee/Hip Exercises: Machines for Strengthening   Cybex Leg Press sled 8 2x10 60 lb 2x10 80 lb      Knee/Hip Exercises: Standing   Heel Raises Limitations 2x15 on hip machine    Lateral Step Up Limitations 8 2x10   Forward Step Up Limitations 8 2x10    Functional Squat Limitations 2x10 with cuing; consistently had popping    SLS 3x15 sec hold      Knee/Hip Exercises: Supine   Bridges Limitations 2x15   Straight Leg Raises Limitations 2x10    Other Supine Knee/Hip Exercises clam shell blue 2x10     Modalities    Modalities Cryotherapy     Cryotherapy   Number Minutes Cryotherapy 15 Minutes   Cryotherapy Location Hip   Type of Cryotherapy Ice pack     Manual Therapy   Manual therapy comments Gentle Passive ROM into flexion ER and IR. Careful not to push into pain or end ranges; IT band roll out                 PT Education - 02/01/17 2049    Education provided Yes   Education Details continue with stretching and strengthening.    Person(s) Educated Patient   Methods Explanation;Demonstration;Tactile cues   Comprehension Verbalized understanding          PT Short Term Goals - 02/01/17 2056      PT SHORT TERM GOAL #1   Title Patient will increase gross right lower extremity strangth to 4+/5    Baseline progressing    Time 4   Period Weeks   Status On-going     PT SHORT TERM GOAL #2   Title Patient will increase passive right hip flexion by 10 degrees    Time 4   Period Weeks   Status On-going     PT SHORT TERM GOAL #3   Title Patient will be independent with initial HEP    Time 4   Period Weeks   Status On-going     PT SHORT TERM GOAL #4   Title Patient will report 2/10 pain at worst in right hip    Time 4   Period Weeks   Status On-going           PT Long Term Goals - 12/25/16 5427      PT LONG TERM GOAL #1   Title Patient will ambualte 3000' with no device and miniaml pain and antalgic gait in order to ambualte in the community   Time 4   Period Weeks   Status New     PT LONG TERM GOAL #2   Title Patient will stand for 20 minutes without increased pain in order to perfrom ADL's    Time 8   Period Weeks   Status New     PT LONG TERM GOAL #3   Title Patient will increase active hip flexion to 90 degrees in order to walk and sit comfortably    Time 8   Period Weeks   Status New  Plan - 02/01/17 2050    Clinical Impression Statement Patient is making progress. his strength has improved although it is still likited. Slow  motion gait analysis last visit showed significant improvement. He continutes to have consistent soreness in the hip. He was advised he may be over working his hip at home. The patient would benefit from further skilled therapy 3x per week for 6 more weeks.    Clinical Presentation Evolving   Clinical Presentation due to: pain and stiffness    Clinical Decision Making Low   Rehab Potential Good   PT Frequency 2x / week   PT Duration 8 weeks   PT Treatment/Interventions ADLs/Self Care Home Management;Cryotherapy;Electrical Stimulation;Iontophoresis 68m/ml Dexamethasone;Traction;Moist Heat;Gait training;Stair training;Functional mobility training;Patient/family education;Therapeutic exercise;Therapeutic activities;Ultrasound;Manual techniques;Passive range of motion;Dry needling;Taping;Vasopneumatic Device   PT Next Visit Plan assess gait in slow mo: continue with exercises    PT Home Exercise Plan heel slide; glut set, quad set, LAQ    Consulted and Agree with Plan of Care Patient      Patient will benefit from skilled therapeutic intervention in order to improve the following deficits and impairments:  Abnormal gait, Decreased range of motion, Difficulty walking, Pain, Decreased endurance, Decreased knowledge of precautions, Decreased strength, Decreased mobility, Impaired sensation, Postural dysfunction  Visit Diagnosis: Pain in right hip  Stiffness of right hip, not elsewhere classified  Difficulty in walking, not elsewhere classified  Localized edema     Problem List Patient Active Problem List   Diagnosis Date Noted  . Impingement syndrome of right shoulder 07/05/2013  . Empyema lung ([place]   . Abscess of lung(513.0)   . Bipolar depression ([place]   . ADD (attention deficit disorder)   . Hepatitis C   . Lung mass 10/22/2010  . Pleural effusion 10/15/2010    DCarney LivingPT DPT  02/01/2017, 8:59 PM  [facility] [address]Phone: [phone]  Fax:  [phone] Name: DKOHLE WINNERMRN: [id number] of Birth: 5Mar 21, 1957

## 2017-02-02 ENCOUNTER — Ambulatory Visit: Payer: BLUE CROSS/BLUE SHIELD | Admitting: Physical Therapy

## 2017-02-02 DIAGNOSIS — R262 Difficulty in walking, not elsewhere classified: Secondary | ICD-10-CM

## 2017-02-02 DIAGNOSIS — R600 Localized edema: Secondary | ICD-10-CM | POA: Diagnosis not present

## 2017-02-02 DIAGNOSIS — M25551 Pain in right hip: Secondary | ICD-10-CM | POA: Diagnosis not present

## 2017-02-02 DIAGNOSIS — M25651 Stiffness of right hip, not elsewhere classified: Secondary | ICD-10-CM | POA: Diagnosis not present

## 2017-02-02 NOTE — Therapy (Signed)
[place], [place], [address] Phone: [phone]   Fax:  [phone]  Physical Therapy Treatment  Patient Details  Name: Paul Ray MRN: [id number] Date of Birth: 05-28-1956 Referring Provider: Dr Sallyanne Havers   Encounter Date: 02/02/2017      PT End of Session - 02/02/17 0910    Visit Number 16   Number of Visits 30   Date for PT Re-Evaluation 03/29/17   Authorization Type BCBS  Ded met   PT Start Time 0845   PT Stop Time 0945   PT Time Calculation (min) 60 min   Activity Tolerance Patient tolerated treatment well   Behavior During Therapy [facility] for tasks assessed/performed      Past Medical History:  Diagnosis Date  . Abscess of lung(513.0)    Right lower lobe  . ADD (attention deficit disorder)   . Anxiety   . Arthritis   . Bipolar depression ([place])   . Empyema lung ([place])   . Hemorrhoids   . Hepatitis C   . Pneumonia    last year  . Prostate abscess   . Prostate troubles     Past Surgical History:  Procedure Laterality Date  . ELBOW SURGERY  2005   Right  . FOOT NEUROMA SURGERY  2006   Left  . HIP RESURFACING     left hip at [facility] 01/2011  . LUMBAR LAMINECTOMY/DECOMPRESSION MICRODISCECTOMY  07/24/2011   Procedure: LUMBAR LAMINECTOMY/DECOMPRESSION MICRODISCECTOMY;  Surgeon: Eustace Moore;  Location: [place] [facility];  Service: Neurosurgery;  Laterality: Bilateral;  Bilateral  , Lumbar Three-Four, Lumbar Four-Five Decompressive Laminectomy Rm # 32  . LUNG SURGERY     for empyema  . SHOULDER ARTHROSCOPY Right 07/05/2013   Procedure: RIGHT SHOULDER ARTHROSCOPY WITH DEBRIDEMENT;  Surgeon: Mcarthur Rossetti, MD;  Location: [place];  Service: Orthopedics;  Laterality: Right;  . TENDON TRANSFER Left 01/26/2014   Procedure: LEFT THUMB TRAPEZIECTOMY KNOTTED TENDON INTRAPOSITION TRANSFER OF ABDUCTOR POLLICIS LONGUS TO [place];  Surgeon: Cammie Sickle, MD;  Location: [place];   Service: Orthopedics;  Laterality: Left;  . THORACOTOMY  November 14 2010   rt    There were no vitals filed for this visit.      Subjective Assessment - 02/02/17 0850    Subjective Patient reports some difficulty putting socks and shoes on this morning.    Pertinent History elbow surgery 2005; shoulder scope 2014; lumbar decompression 2014    Limitations Standing;Walking;House hold activities;Lifting   How long can you sit comfortably? No limit    How long can you stand comfortably? < 5 minutes before pain increases    How long can you walk comfortably? 200-300 feet in the community before pain increases    Diagnostic tests Venus doppler 12/22/2016 per ED notes (-)    Patient Stated Goals to have less pain with walking    Currently in Pain? Yes   Pain Score 4    Pain Location Hip   Pain Orientation Right   Pain Descriptors / Indicators Aching   Pain Type Acute pain   Pain Onset More than a month ago   Pain Frequency Constant   Aggravating Factors  standing and walking    Pain Relieving Factors rest    Effect of Pain on Daily Activities difficulty perfroming ADL's  PT Education - 02/02/17 0910    Education provided Yes   Education Details continue with current HEP    Person(s) Educated Patient   Methods Explanation;Demonstration;Tactile cues;Verbal cues   Comprehension Verbalized understanding;Returned demonstration;Verbal cues required;Tactile cues required          PT Short Term Goals - 02/01/17 2056      PT SHORT TERM GOAL #1   Title Patient will increase gross right lower extremity strangth to 4+/5    Baseline progressing    Time 4   Period Weeks   Status On-going     PT SHORT TERM GOAL #2   Title Patient will increase passive right hip flexion by 10 degrees    Time 4   Period Weeks   Status On-going     PT SHORT TERM GOAL #3   Title Patient will be independent with initial HEP    Time 4   Period  Weeks   Status On-going     PT SHORT TERM GOAL #4   Title Patient will report 2/10 pain at worst in right hip    Time 4   Period Weeks   Status On-going           PT Long Term Goals - 12/25/16 5456      PT LONG TERM GOAL #1   Title Patient will ambualte 3000' with no device and miniaml pain and antalgic gait in order to ambualte in the community   Time 4   Period Weeks   Status New     PT LONG TERM GOAL #2   Title Patient will stand for 20 minutes without increased pain in order to perfrom ADL's    Time 8   Period Weeks   Status New     PT LONG TERM GOAL #3   Title Patient will increase active hip flexion to 90 degrees in order to walk and sit comfortably    Time 8   Period Weeks   Status New               Plan - 02/02/17 2563    Clinical Impression Statement Continue to progress exercises as tolerated. Added step downs today. Patient is progresssing towards all goals.    Clinical Presentation Evolving   PT Frequency 2x / week   PT Duration 8 weeks   PT Treatment/Interventions ADLs/Self Care Home Management;Cryotherapy;Electrical Stimulation;Iontophoresis 51m/ml Dexamethasone;Traction;Moist Heat;Gait training;Stair training;Functional mobility training;Patient/family education;Therapeutic exercise;Therapeutic activities;Ultrasound;Manual techniques;Passive range of motion;Dry needling;Taping;Vasopneumatic Device   PT Next Visit Plan assess gait in slow mo: continue with exercises    PT Home Exercise Plan heel slide; glut set, quad set, LAQ    Consulted and Agree with Plan of Care Patient      Patient will benefit from skilled therapeutic intervention in order to improve the following deficits and impairments:  Abnormal gait, Decreased range of motion, Difficulty walking, Pain, Decreased endurance, Decreased knowledge of precautions, Decreased strength, Decreased mobility, Impaired sensation, Postural dysfunction  Visit Diagnosis: Pain in right  hip  Stiffness of right hip, not elsewhere classified  Difficulty in walking, not elsewhere classified  Localized edema     Problem List Patient Active Problem List   Diagnosis Date Noted  . Impingement syndrome of right shoulder 07/05/2013  . [place] ([place]   . Abscess of lung(513.0)   . Bipolar depression ([place]   . ADD (attention deficit disorder)   . Hepatitis C   . Lung mass 10/22/2010  . Pleural effusion 10/15/2010  Carney Living PT DPT  02/02/2017, 11:15 AM  [facility] [address] [place], [place], [address] Phone: [phone]   Fax:  [phone]  Name: Paul Ray MRN: [id number] Date of Birth: 04-Sep-1955

## 2017-02-02 NOTE — Addendum Note (Signed)
Addended by: Carney Living on: 02/02/2017 01:58 PM   Modules accepted: Orders

## 2017-02-03 MED FILL — DEXTROAMP-AMPHETAMIN 20 MG: 20 | 90 days supply | Qty: 90 | Fill #0

## 2017-02-03 MED FILL — ADDERALL XR 30 MG CAP SA: 30 | 90 days supply | Qty: 180 | Fill #0

## 2017-02-04 ENCOUNTER — Ambulatory Visit: Payer: BLUE CROSS/BLUE SHIELD | Admitting: Physical Therapy

## 2017-02-04 ENCOUNTER — Encounter: Payer: Self-pay | Admitting: Physical Therapy

## 2017-02-04 DIAGNOSIS — R262 Difficulty in walking, not elsewhere classified: Secondary | ICD-10-CM | POA: Diagnosis not present

## 2017-02-04 DIAGNOSIS — M25651 Stiffness of right hip, not elsewhere classified: Secondary | ICD-10-CM | POA: Diagnosis not present

## 2017-02-04 DIAGNOSIS — M25551 Pain in right hip: Secondary | ICD-10-CM

## 2017-02-04 DIAGNOSIS — R600 Localized edema: Secondary | ICD-10-CM

## 2017-02-04 MED FILL — TAMSULOSIN HCL 0.4 MG CAP: 0.4 | 30 days supply | Qty: 60 | Fill #0

## 2017-02-04 NOTE — Therapy (Signed)
[place], [place], [address] Phone: [phone]   Fax:  [phone]  Physical Therapy Treatment  Patient Details  Name: PARKS CZAJKOWSKI MRN: [id number] Date of Birth: 1955/09/09 Referring Provider: Dr Sallyanne Havers   Encounter Date: 02/04/2017      PT End of Session - 02/04/17 0917    Visit Number 17   Number of Visits 30   Date for PT Re-Evaluation 03/29/17   Authorization Type BCBS  Ded met   PT Start Time 0846   PT Stop Time 0947   PT Time Calculation (min) 61 min   Activity Tolerance Patient tolerated treatment well   Behavior During Therapy [facility] for tasks assessed/performed      Past Medical History:  Diagnosis Date  . Abscess of lung(513.0)    Right lower lobe  . ADD (attention deficit disorder)   . Anxiety   . Arthritis   . Bipolar depression ([place])   . Empyema lung ([place])   . Hemorrhoids   . Hepatitis C   . Pneumonia    last year  . Prostate abscess   . Prostate troubles     Past Surgical History:  Procedure Laterality Date  . ELBOW SURGERY  2005   Right  . FOOT NEUROMA SURGERY  2006   Left  . HIP RESURFACING     left hip at [facility] 01/2011  . LUMBAR LAMINECTOMY/DECOMPRESSION MICRODISCECTOMY  07/24/2011   Procedure: LUMBAR LAMINECTOMY/DECOMPRESSION MICRODISCECTOMY;  Surgeon: Eustace Moore;  Location: [place] NEUR[facility];  Service: Neurosurgery;  Laterality: Bilateral;  Bilateral  , Lumbar Three-Four, Lumbar Four-Five Decompressive Laminectomy Rm # 32  . LUNG SURGERY     for empyema  . SHOULDER ARTHROSCOPY Right 07/05/2013   Procedure: RIGHT SHOULDER ARTHROSCOPY WITH DEBRIDEMENT;  Surgeon: Mcarthur Rossetti, MD;  Location: [place];  Service: Orthopedics;  Laterality: Right;  . TENDON TRANSFER Left 01/26/2014   Procedure: LEFT THUMB TRAPEZIECTOMY KNOTTED TENDON INTRAPOSITION TRANSFER OF ABDUCTOR POLLICIS LONGUS TO [place];  Surgeon: Cammie Sickle, MD;  Location: [place];   Service: Orthopedics;  Laterality: Left;  . THORACOTOMY  November 14 2010   rt    There were no vitals filed for this visit.      Subjective Assessment - 02/04/17 0853    Subjective Patient continues to have stiffness and soreness in his hip. He caught his toe the other day when going up astep. He did not fall or hurt himself though.    Pertinent History elbow surgery 2005; shoulder scope 2014; lumbar decompression 2014    How long can you sit comfortably? No limit    How long can you stand comfortably? < 5 minutes before pain increases    How long can you walk comfortably? 200-300 feet in the community before pain increases    Diagnostic tests Venus doppler 12/22/2016 per ED notes (-)    Patient Stated Goals to have less pain with walking    Currently in Pain? Yes   Pain Score 4    Pain Location Hip   Pain Orientation Right   Pain Descriptors / Indicators Aching   Pain Type Acute pain   Pain Onset More than a month ago   Pain Frequency Constant   Aggravating Factors  standing and walking    Pain Relieving Factors rest    Effect of Pain on Daily Activities difficulty perfroming ADL;s  PT Education - 02/04/17 1315    Education provided Yes   Education Details continue with HEP    Person(s) Educated Patient   Methods Explanation;Demonstration;Tactile cues   Comprehension Verbalized understanding;Returned demonstration;Verbal cues required;Tactile cues required          PT Short Term Goals - 02/01/17 2056      PT SHORT TERM GOAL #1   Title Patient will increase gross right lower extremity strangth to 4+/5    Baseline progressing    Time 4   Period Weeks   Status On-going     PT SHORT TERM GOAL #2   Title Patient will increase passive right hip flexion by 10 degrees    Time 4   Period Weeks   Status On-going     PT SHORT TERM GOAL #3   Title Patient will be independent with initial HEP    Time 4   Period  Weeks   Status On-going     PT SHORT TERM GOAL #4   Title Patient will report 2/10 pain at worst in right hip    Time 4   Period Weeks   Status On-going           PT Long Term Goals - 12/25/16 0272      PT LONG TERM GOAL #1   Title Patient will ambualte 3000' with no device and miniaml pain and antalgic gait in order to ambualte in the community   Time 4   Period Weeks   Status New     PT LONG TERM GOAL #2   Title Patient will stand for 20 minutes without increased pain in order to perfrom ADL's    Time 8   Period Weeks   Status New     PT LONG TERM GOAL #3   Title Patient will increase active hip flexion to 90 degrees in order to walk and sit comfortably    Time 8   Period Weeks   Status New               Plan - 02/04/17 5366    Clinical Impression Statement Continue to work on Step ups and step downs. No significant pain after treatemtn. Range of motion is progressing approprietly. Therapy adeed neuro-muscualr stability training to patients program.    Clinical Presentation Evolving   Clinical Decision Making Moderate   Rehab Potential Good   PT Frequency 2x / week   PT Duration 8 weeks   PT Treatment/Interventions ADLs/Self Care Home Management;Cryotherapy;Electrical Stimulation;Iontophoresis 43m/ml Dexamethasone;Traction;Moist Heat;Gait training;Stair training;Functional mobility training;Patient/family education;Therapeutic exercise;Therapeutic activities;Ultrasound;Manual techniques;Passive range of motion;Dry needling;Taping;Vasopneumatic Device   PT Next Visit Plan assess gait in slow mo: continue with exercises    PT Home Exercise Plan heel slide; glut set, quad set, LAQ    Consulted and Agree with Plan of Care Patient      Patient will benefit from skilled therapeutic intervention in order to improve the following deficits and impairments:     Visit Diagnosis: Pain in right hip  Stiffness of right hip, not elsewhere classified  Difficulty in  walking, not elsewhere classified  Localized edema     Problem List Patient Active Problem List   Diagnosis Date Noted  . Impingement syndrome of right shoulder 07/05/2013  . Empyema lung ([place]   . Abscess of lung(513.0)   . Bipolar depression ([place]   . ADD (attention deficit disorder)   . Hepatitis C   . Lung mass 10/22/2010  . Pleural effusion 10/15/2010  Carney Living PT DPT  02/04/2017, 1:18 PM  [facility] [address] [place], [place], [address] Phone: [phone]   Fax:  [phone]  Name: MILAD BUBLITZ MRN: [id number] Date of Birth: 1956-02-11

## 2017-02-05 DIAGNOSIS — L309 Dermatitis, unspecified: Secondary | ICD-10-CM | POA: Diagnosis not present

## 2017-02-05 MED FILL — TRIAMCINOLONE 0.1% CREAM: 0.1 | 7 days supply | Qty: 15 | Fill #0

## 2017-02-06 ENCOUNTER — Encounter: Payer: Self-pay | Admitting: Physical Therapy

## 2017-02-06 ENCOUNTER — Encounter: Payer: BLUE CROSS/BLUE SHIELD | Admitting: Physical Therapy

## 2017-02-06 ENCOUNTER — Ambulatory Visit: Payer: BLUE CROSS/BLUE SHIELD | Admitting: Physical Therapy

## 2017-02-06 DIAGNOSIS — M25651 Stiffness of right hip, not elsewhere classified: Secondary | ICD-10-CM

## 2017-02-06 DIAGNOSIS — M25551 Pain in right hip: Secondary | ICD-10-CM | POA: Diagnosis not present

## 2017-02-06 DIAGNOSIS — R600 Localized edema: Secondary | ICD-10-CM

## 2017-02-06 DIAGNOSIS — R262 Difficulty in walking, not elsewhere classified: Secondary | ICD-10-CM | POA: Diagnosis not present

## 2017-02-09 ENCOUNTER — Encounter: Payer: Self-pay | Admitting: Physical Therapy

## 2017-02-09 ENCOUNTER — Ambulatory Visit: Payer: BLUE CROSS/BLUE SHIELD | Admitting: Physical Therapy

## 2017-02-09 DIAGNOSIS — M25651 Stiffness of right hip, not elsewhere classified: Secondary | ICD-10-CM

## 2017-02-09 DIAGNOSIS — R600 Localized edema: Secondary | ICD-10-CM | POA: Diagnosis not present

## 2017-02-09 DIAGNOSIS — R262 Difficulty in walking, not elsewhere classified: Secondary | ICD-10-CM | POA: Diagnosis not present

## 2017-02-09 DIAGNOSIS — M25551 Pain in right hip: Secondary | ICD-10-CM

## 2017-02-09 NOTE — Therapy (Signed)
[place], [place], [address] Phone: [phone]   Fax:  [phone]  Physical Therapy Treatment  Patient Details  Name: Paul Ray MRN: [id number] Date of Birth: [birth year] Referring Provider: Dr Sallyanne Havers   Encounter Date: 02/06/2017    Past Medical History:  Diagnosis Date  . Abscess of lung(513.0)    Right lower lobe  . ADD (attention deficit disorder)   . Anxiety   . Arthritis   . Bipolar depression ([place])   . Empyema lung ([place])   . Hemorrhoids   . Hepatitis C   . Pneumonia    last year  . Prostate abscess   . Prostate troubles     Past Surgical History:  Procedure Laterality Date  . ELBOW SURGERY  2005   Right  . FOOT NEUROMA SURGERY  2006   Left  . HIP RESURFACING     left hip [facility] 01/2011  . LUMBAR LAMINECTOMY/DECOMPRESSION MICRODISCECTOMY  07/24/2011   Procedure: LUMBAR LAMINECTOMY/DECOMPRESSION MICRODISCECTOMY;  Surgeon: Eustace Moore;  Location: [place] [facility];  Service: Neurosurgery;  Laterality: Bilateral;  Bilateral  , Lumbar Three-Four, Lumbar Four-Five Decompressive Laminectomy Rm # 32  . LUNG SURGERY     for empyema  . SHOULDER ARTHROSCOPY Right 07/05/2013   Procedure: RIGHT SHOULDER ARTHROSCOPY WITH DEBRIDEMENT;  Surgeon: Mcarthur Rossetti, MD;  Location: [place];  Service: Orthopedics;  Laterality: Right;  . TENDON TRANSFER Left 01/26/2014   Procedure: LEFT THUMB TRAPEZIECTOMY KNOTTED TENDON INTRAPOSITION TRANSFER OF ABDUCTOR POLLICIS LONGUS TO [place];  Surgeon: Cammie Sickle, MD;  Location: [place];  Service: Orthopedics;  Laterality: Left;  . THORACOTOMY  November 14 2010   rt    There were no vitals filed for this visit.      Subjective Assessment - 02/09/17 0802    Subjective Patient reports his hip is stiff and sore this morning. He feels like his socks are going on eaisier    Pertinent History elbow surgery 2005; shoulder scope 2014;  lumbar decompression 2014    Limitations Standing;Walking;House hold activities;Lifting   How long can you sit comfortably? No limit    How long can you stand comfortably? < 5 minutes before pain increases    How long can you walk comfortably? 200-300 feet in the community before pain increases    Diagnostic tests Venus doppler 12/22/2016 per ED notes (-)    Patient Stated Goals to have less pain with walking    Currently in Pain? Yes   Pain Score 4    Pain Location Hip   Pain Orientation Right   Pain Descriptors / Indicators Aching   Pain Type Acute pain   Pain Onset More than a month ago   Pain Frequency Constant   Aggravating Factors  standing and walking    Pain Relieving Factors rest    Effect of Pain on Daily Activities difficulty perfroming ADL's                                    PT Short Term Goals - 02/01/17 2056      PT SHORT TERM GOAL #1   Title Patient will increase gross right lower extremity strangth to 4+/5    Baseline progressing    Time 4   Period Weeks   Status On-going     PT SHORT TERM GOAL #2  Title Patient will increase passive right hip flexion by 10 degrees    Time 4   Period Weeks   Status On-going     PT SHORT TERM GOAL #3   Title Patient will be independent with initial HEP    Time 4   Period Weeks   Status On-going     PT SHORT TERM GOAL #4   Title Patient will report 2/10 pain at worst in right hip    Time 4   Period Weeks   Status On-going           PT Long Term Goals - 12/25/16 7048      PT LONG TERM GOAL #1   Title Patient will ambualte 3000' with no device and miniaml pain and antalgic gait in order to ambualte in the community   Time 4   Period Weeks   Status New     PT LONG TERM GOAL #2   Title Patient will stand for 20 minutes without increased pain in order to perfrom ADL's    Time 8   Period Weeks   Status New     PT LONG TERM GOAL #3   Title Patient will increase active hip flexion  to 90 degrees in order to walk and sit comfortably    Time 8   Period Weeks   Status New               Plan - 02/09/17 0802    Clinical Impression Statement Patient reported some knee pain with 8 inch step up. Step was moved down to a 4 inch without pain. PROM is improving. Strength appears to be improving. Therapy will continue to progres patient as tolerated.    Rehab Potential Good   PT Frequency 2x / week   PT Duration 8 weeks   PT Treatment/Interventions ADLs/Self Care Home Management;Cryotherapy;Electrical Stimulation;Iontophoresis 4mg /ml Dexamethasone;Traction;Moist Heat;Gait training;Stair training;Functional mobility training;Patient/family education;Therapeutic exercise;Therapeutic activities;Ultrasound;Manual techniques;Passive range of motion;Dry needling;Taping;Vasopneumatic Device   PT Next Visit Plan assess gait in slow mo: continue with exercises    PT Home Exercise Plan heel slide; glut set, quad set, LAQ    Consulted and Agree with Plan of Care Patient      Patient will benefit from skilled therapeutic intervention in order to improve the following deficits and impairments:  Abnormal gait, Decreased range of motion, Difficulty walking, Pain, Decreased endurance, Decreased knowledge of precautions, Decreased strength, Decreased mobility, Impaired sensation, Postural dysfunction  Visit Diagnosis: Pain in right hip  Stiffness of right hip, not elsewhere classified  Difficulty in walking, not elsewhere classified  Localized edema     Problem List Patient Active Problem List   Diagnosis Date Noted  . Impingement syndrome of right shoulder 07/05/2013  . Empyema lung ([place])   . Abscess of lung(513.0)   . Bipolar depression ([place])   . ADD (attention deficit disorder)   . Hepatitis C   . Lung mass 10/22/2010  . Pleural effusion 10/15/2010    Carney Living 02/09/2017, 8:03 AM  [facility] [address] [place], [place], [address] Phone: [phone]   Fax:  [phone]  Name: Paul Ray MRN: [id number] Date of Birth: [birth year]

## 2017-02-09 NOTE — Therapy (Signed)
[place], [place], [address] Phone: [phone]   Fax:  [phone]  Physical Therapy Treatment  Patient Details  Name: Paul Ray MRN: [id number] Date of Birth: [birth year] Referring Provider: Dr Sallyanne Havers   Encounter Date: 02/09/2017      PT End of Session - 02/09/17 0901    Visit Number 19   Number of Visits 30   Date for PT Re-Evaluation 03/29/17   Authorization Type BCBS  Ded met   PT Start Time 0825   PT Stop Time 0923   PT Time Calculation (min) 58 min   Activity Tolerance Patient tolerated treatment well   Behavior During Therapy [facility] for tasks assessed/performed      Past Medical History:  Diagnosis Date  . Abscess of lung(513.0)    Right lower lobe  . ADD (attention deficit disorder)   . Anxiety   . Arthritis   . Bipolar depression ([place])   . Empyema lung ([place])   . Hemorrhoids   . Hepatitis C   . Pneumonia    last year  . Prostate abscess   . Prostate troubles     Past Surgical History:  Procedure Laterality Date  . ELBOW SURGERY  2005   Right  . FOOT NEUROMA SURGERY  2006   Left  . HIP RESURFACING     left hip at [facility] 01/2011  . LUMBAR LAMINECTOMY/DECOMPRESSION MICRODISCECTOMY  07/24/2011   Procedure: LUMBAR LAMINECTOMY/DECOMPRESSION MICRODISCECTOMY;  Surgeon: Eustace Moore;  Location: [place] [facility];  Service: Neurosurgery;  Laterality: Bilateral;  Bilateral  , Lumbar Three-Four, Lumbar Four-Five Decompressive Laminectomy Rm # 32  . LUNG SURGERY     for empyema  . SHOULDER ARTHROSCOPY Right 07/05/2013   Procedure: RIGHT SHOULDER ARTHROSCOPY WITH DEBRIDEMENT;  Surgeon: Mcarthur Rossetti, MD;  Location: [place];  Service: Orthopedics;  Laterality: Right;  . TENDON TRANSFER Left 01/26/2014   Procedure: LEFT THUMB TRAPEZIECTOMY KNOTTED TENDON INTRAPOSITION TRANSFER OF ABDUCTOR POLLICIS LONGUS TO [place];  Surgeon: Cammie Sickle, MD;  Location: [place];   Service: Orthopedics;  Laterality: Left;  . THORACOTOMY  November 14 2010   rt    There were no vitals filed for this visit.      Subjective Assessment - 02/09/17 0857    Subjective Patient reports no major problems over the weekend. He is a little stiff and sore today.    Pertinent History elbow surgery 2005; shoulder scope 2014; lumbar decompression 2014    Limitations Standing;Walking;House hold activities;Lifting   How long can you sit comfortably? No limit    How long can you stand comfortably? < 5 minutes before pain increases    How long can you walk comfortably? 200-300 feet in the community before pain increases    Diagnostic tests Venus doppler 12/22/2016 per ED notes (-)    Patient Stated Goals to have less pain with walking    Currently in Pain? Yes   Pain Score 4    Pain Location Hip   Pain Orientation Right   Pain Descriptors / Indicators Aching   Pain Type Acute pain   Pain Onset More than a month ago   Pain Frequency Constant   Aggravating Factors  standing and walking    Pain Relieving Factors rest    Effect of Pain on Daily Activities difficulty perfroming ADL's  [place] Adult PT Treatment/Exercise - 02/09/17 0001      Knee/Hip Exercises: Machines for Strengthening   Cybex Leg Press sled 8 2x10 60 lb 2x10 80 lb      Knee/Hip Exercises: Standing   Heel Raises Limitations 2x15 on hip machine    Lateral Step Up Limitations 4 2x10   Forward Step Up Limitations 4 2x10    Step Down Limitations 4 2x10   Functional Squat Limitations 2x10 with cuing; consistently had popping    SLS 3x15 sec hold      Knee/Hip Exercises: Supine   Bridges Limitations 2x15   Straight Leg Raises Limitations 2x10    Other Supine Knee/Hip Exercises clam shell blue 2x10     Modalities   Modalities Cryotherapy     Cryotherapy   Number Minutes Cryotherapy 15 Minutes   Cryotherapy Location Hip   Type of Cryotherapy Ice pack     Manual Therapy    Manual therapy comments Gentle Passive ROM into flexion ER and IR. Careful not to push into pain or end ranges; IT band roll out                 PT Education - 02/09/17 0900    Education provided Yes   Education Details continue with HEP    Person(s) Educated Patient   Methods Explanation;Demonstration;Tactile cues   Comprehension Verbalized understanding;Returned demonstration;Verbal cues required;Tactile cues required          PT Short Term Goals - 02/01/17 2056      PT SHORT TERM GOAL #1   Title Patient will increase gross right lower extremity strangth to 4+/5    Baseline progressing    Time 4   Period Weeks   Status On-going     PT SHORT TERM GOAL #2   Title Patient will increase passive right hip flexion by 10 degrees    Time 4   Period Weeks   Status On-going     PT SHORT TERM GOAL #3   Title Patient will be independent with initial HEP    Time 4   Period Weeks   Status On-going     PT SHORT TERM GOAL #4   Title Patient will report 2/10 pain at worst in right hip    Time 4   Period Weeks   Status On-going           PT Long Term Goals - 12/25/16 3810      PT LONG TERM GOAL #1   Title Patient will ambualte 3000' with no device and miniaml pain and antalgic gait in order to ambualte in the community   Time 4   Period Weeks   Status New     PT LONG TERM GOAL #2   Title Patient will stand for 20 minutes without increased pain in order to perfrom ADL's    Time 8   Period Weeks   Status New     PT LONG TERM GOAL #3   Title Patient will increase active hip flexion to 90 degrees in order to walk and sit comfortably    Time 8   Period Weeks   Status New               Plan - 02/09/17 0907    Clinical Impression Statement Patient continues to work on the 4 inch step. He is making good porgress. Therapy will advance the steps. He had been doing an eight inch but felt like it was making his  hip sore. His range continues to improve.     Clinical Presentation Evolving   Clinical Presentation due to: pain and stiffness    Clinical Decision Making Moderate   Rehab Potential Good   PT Frequency 2x / week   PT Duration 8 weeks   PT Treatment/Interventions ADLs/Self Care Home Management;Cryotherapy;Electrical Stimulation;Iontophoresis 23m/ml Dexamethasone;Traction;Moist Heat;Gait training;Stair training;Functional mobility training;Patient/family education;Therapeutic exercise;Therapeutic activities;Ultrasound;Manual techniques;Passive range of motion;Dry needling;Taping;Vasopneumatic Device   PT Next Visit Plan assess gait in slow mo: continue with exercises    PT Home Exercise Plan heel slide; glut set, quad set, LAQ    Consulted and Agree with Plan of Care Patient      Patient will benefit from skilled therapeutic intervention in order to improve the following deficits and impairments:  Abnormal gait, Decreased range of motion, Difficulty walking, Pain, Decreased endurance, Decreased knowledge of precautions, Decreased strength, Decreased mobility, Impaired sensation, Postural dysfunction  Visit Diagnosis: Pain in right hip  Stiffness of right hip, not elsewhere classified  Difficulty in walking, not elsewhere classified  Localized edema     Problem List Patient Active Problem List   Diagnosis Date Noted  . Impingement syndrome of right shoulder 07/05/2013  . [place]   . Abscess of lung(513.0)   . Bipolar depression ([place]   . ADD (attention deficit disorder)   . Hepatitis C   . Lung mass 10/22/2010  . Pleural effusion 10/15/2010    DCarney LivingPT DPT  02/09/2017, 9:20 AM  [facility] [address]Phone: [phone]  Fax:  [phone] Name: Paul CATTELLMRN: [id number] of Birth: 02/02/1956/02/25

## 2017-02-11 ENCOUNTER — Ambulatory Visit: Payer: BLUE CROSS/BLUE SHIELD | Admitting: Physical Therapy

## 2017-02-11 ENCOUNTER — Encounter: Payer: Self-pay | Admitting: Physical Therapy

## 2017-02-11 DIAGNOSIS — M25551 Pain in right hip: Secondary | ICD-10-CM | POA: Diagnosis not present

## 2017-02-11 DIAGNOSIS — R262 Difficulty in walking, not elsewhere classified: Secondary | ICD-10-CM

## 2017-02-11 DIAGNOSIS — M25651 Stiffness of right hip, not elsewhere classified: Secondary | ICD-10-CM | POA: Diagnosis not present

## 2017-02-11 DIAGNOSIS — R600 Localized edema: Secondary | ICD-10-CM

## 2017-02-11 NOTE — Therapy (Signed)
[place], [place], [address] Phone: [phone]   Fax:  [phone]  Physical Therapy Treatment  Patient Details  Name: Paul Ray MRN: [id number] Date of Birth: May 20, 1956 Referring Provider: Dr Sallyanne Havers   Encounter Date: 02/11/2017      PT End of Session - 02/11/17 1008    Visit Number 20   Number of Visits 30   Date for PT Re-Evaluation 03/29/17   Authorization Type BCBS  Ded met   PT Start Time 0932   PT Stop Time 1028   PT Time Calculation (min) 56 min   Activity Tolerance Patient tolerated treatment well   Behavior During Therapy [facility] for tasks assessed/performed      Past Medical History:  Diagnosis Date  . Abscess of lung(513.0)    Right lower lobe  . ADD (attention deficit disorder)   . Anxiety   . Arthritis   . Bipolar depression ([place])   . Empyema lung ([place])   . Hemorrhoids   . Hepatitis C   . Pneumonia    last year  . Prostate abscess   . Prostate troubles     Past Surgical History:  Procedure Laterality Date  . ELBOW SURGERY  2005   Right  . FOOT NEUROMA SURGERY  2006   Left  . HIP RESURFACING     left hip at [facility] 01/2011  . LUMBAR LAMINECTOMY/DECOMPRESSION MICRODISCECTOMY  07/24/2011   Procedure: LUMBAR LAMINECTOMY/DECOMPRESSION MICRODISCECTOMY;  Surgeon: Eustace Moore;  Location: [place] [facility];  Service: Neurosurgery;  Laterality: Bilateral;  Bilateral  , Lumbar Three-Four, Lumbar Four-Five Decompressive Laminectomy Rm # 32  . LUNG SURGERY     for empyema  . SHOULDER ARTHROSCOPY Right 07/05/2013   Procedure: RIGHT SHOULDER ARTHROSCOPY WITH DEBRIDEMENT;  Surgeon: Mcarthur Rossetti, MD;  Location: [place];  Service: Orthopedics;  Laterality: Right;  . TENDON TRANSFER Left 01/26/2014   Procedure: LEFT THUMB TRAPEZIECTOMY KNOTTED TENDON INTRAPOSITION TRANSFER OF ABDUCTOR POLLICIS LONGUS TO [place];  Surgeon: Cammie Sickle, MD;  Location: [place];   Service: Orthopedics;  Laterality: Left;  . THORACOTOMY  November 14 2010   rt    There were no vitals filed for this visit.      Subjective Assessment - 02/11/17 0934    Subjective Patient continues to report pain with the hip. He reports it is stiff in the mornings when he first wakes up. He continues to have the same pain level.    Pertinent History elbow surgery 2005; shoulder scope 2014; lumbar decompression 2014    Limitations Standing;Walking;House hold activities;Lifting   How long can you sit comfortably? No limit    How long can you stand comfortably? < 5 minutes before pain increases    How long can you walk comfortably? 200-300 feet in the community before pain increases    Diagnostic tests Venus doppler 12/22/2016 per ED notes (-)    Patient Stated Goals to have less pain with walking    Currently in Pain? Yes   Pain Score 5    Pain Location Hip   Pain Orientation Right   Pain Descriptors / Indicators Aching   Pain Type Surgical pain   Pain Onset More than a month ago   Pain Frequency Constant   Aggravating Factors  standing and walking   Pain Relieving Factors rest    Effect of Pain on Daily Activities difficulty perfroming ADL's  [place] Adult PT Treatment/Exercise - 02/11/17 0001      Knee/Hip Exercises: Machines for Strengthening   Cybex Leg Press sled 8 2x10 60 lb 2x10 80 lb      Knee/Hip Exercises: Standing   Heel Raises Limitations 2x15 on hip machine    Lateral Step Up Limitations 6 2x10   Forward Step Up Limitations 6 2x10    Step Down Limitations 4 2x10   Functional Squat Limitations 2x10 with cuing; consistently had popping    SLS 3x15 sec hold      Knee/Hip Exercises: Supine   Bridges Limitations 2x15   Straight Leg Raises Limitations 2x10    Other Supine Knee/Hip Exercises clam shell blue 2x10     Modalities   Modalities Cryotherapy     Cryotherapy   Number Minutes Cryotherapy 15 Minutes   Cryotherapy  Location Hip   Type of Cryotherapy Ice pack     Manual Therapy   Manual therapy comments Gentle Passive ROM into flexion ER and IR. Careful not to push into pain or end ranges; IT band roll out                 PT Education - 02/11/17 1008    Education provided Yes   Education Details continue with current HEP    Person(s) Educated Patient   Methods Explanation;Demonstration;Tactile cues   Comprehension Verbalized understanding;Returned demonstration;Verbal cues required;Tactile cues required          PT Short Term Goals - 02/01/17 2056      PT SHORT TERM GOAL #1   Title Patient will increase gross right lower extremity strangth to 4+/5    Baseline progressing    Time 4   Period Weeks   Status On-going     PT SHORT TERM GOAL #2   Title Patient will increase passive right hip flexion by 10 degrees    Time 4   Period Weeks   Status On-going     PT SHORT TERM GOAL #3   Title Patient will be independent with initial HEP    Time 4   Period Weeks   Status On-going     PT SHORT TERM GOAL #4   Title Patient will report 2/10 pain at worst in right hip    Time 4   Period Weeks   Status On-going           PT Long Term Goals - 12/25/16 7412      PT LONG TERM GOAL #1   Title Patient will ambualte 3000' with no device and miniaml pain and antalgic gait in order to ambualte in the community   Time 4   Period Weeks   Status New     PT LONG TERM GOAL #2   Title Patient will stand for 20 minutes without increased pain in order to perfrom ADL's    Time 8   Period Weeks   Status New     PT LONG TERM GOAL #3   Title Patient will increase active hip flexion to 90 degrees in order to walk and sit comfortably    Time 8   Period Weeks   Status New               Plan - 02/11/17 2014    Clinical Impression Statement Therapy continues to advise patient to not do anything that cuases him pain at home. He is having pain in his right knee. He was advised we  may have to scale  back some of his functional exercises to protect his knee.    History and Personal Factors relevant to plan of care: flutuating pain and stiffness    Clinical Presentation Evolving   Clinical Presentation due to: pain and sitffness    Clinical Decision Making Moderate   Rehab Potential Good   PT Frequency 2x / week   PT Duration 8 weeks   PT Treatment/Interventions ADLs/Self Care Home Management;Cryotherapy;Electrical Stimulation;Iontophoresis 75m/ml Dexamethasone;Traction;Moist Heat;Gait training;Stair training;Functional mobility training;Patient/family education;Therapeutic exercise;Therapeutic activities;Ultrasound;Manual techniques;Passive range of motion;Dry needling;Taping;Vasopneumatic Device   PT Next Visit Plan assess gait in slow mo: continue with exercises    PT Home Exercise Plan heel slide; glut set, quad set, LAQ    Consulted and Agree with Plan of Care Patient      Patient will benefit from skilled therapeutic intervention in order to improve the following deficits and impairments:  Abnormal gait, Decreased range of motion, Difficulty walking, Pain, Decreased endurance, Decreased knowledge of precautions, Decreased strength, Decreased mobility, Impaired sensation, Postural dysfunction  Visit Diagnosis: Pain in right hip  Stiffness of right hip, not elsewhere classified  Difficulty in walking, not elsewhere classified  Localized edema     Problem List Patient Active Problem List   Diagnosis Date Noted  . Impingement syndrome of right shoulder 07/05/2013  . Empyema lung ([place]   . Abscess of lung(513.0)   . Bipolar depression ([place]   . ADD (attention deficit disorder)   . Hepatitis C   . Lung mass 10/22/2010  . Pleural effusion 10/15/2010    DCarney LivingPT DPT  02/11/2017, 8:19 PM  [facility] [address]Phone: [phone]  Fax:  [phone] Name:  Paul LOCKERMRN: [id number] of Birth: 502/27/1957

## 2017-02-12 DIAGNOSIS — Z736 Limitation of activities due to disability: Secondary | ICD-10-CM

## 2017-02-13 ENCOUNTER — Ambulatory Visit: Payer: BLUE CROSS/BLUE SHIELD | Admitting: Physical Therapy

## 2017-02-13 DIAGNOSIS — R262 Difficulty in walking, not elsewhere classified: Secondary | ICD-10-CM | POA: Diagnosis not present

## 2017-02-13 DIAGNOSIS — R600 Localized edema: Secondary | ICD-10-CM

## 2017-02-13 DIAGNOSIS — M25651 Stiffness of right hip, not elsewhere classified: Secondary | ICD-10-CM

## 2017-02-13 DIAGNOSIS — M25551 Pain in right hip: Secondary | ICD-10-CM | POA: Diagnosis not present

## 2017-02-13 NOTE — Therapy (Signed)
[place], [place], [address] Phone: [phone]   Fax:  [phone]  Physical Therapy Treatment  Patient Details  Name: Paul Ray MRN: [id number] Date of Birth: 1956/01/13 Referring Provider: Dr Sallyanne Havers   Encounter Date: 02/13/2017      PT End of Session - 02/13/17 1042    Visit Number 21   Number of Visits 30   Date for PT Re-Evaluation 03/29/17   Authorization Type BCBS  Ded met   PT Start Time 0927   PT Stop Time 1030   PT Time Calculation (min) 63 min   Activity Tolerance Patient tolerated treatment well   Behavior During Therapy [facility] for tasks assessed/performed      Past Medical History:  Diagnosis Date  . Abscess of lung(513.0)    Right lower lobe  . ADD (attention deficit disorder)   . Anxiety   . Arthritis   . Bipolar depression ([place])   . Empyema lung ([place])   . Hemorrhoids   . Hepatitis C   . Pneumonia    last year  . Prostate abscess   . Prostate troubles     Past Surgical History:  Procedure Laterality Date  . ELBOW SURGERY  2005   Right  . FOOT NEUROMA SURGERY  2006   Left  . HIP RESURFACING     [facility] hip at [facility] 01/2011  . LUMBAR LAMINECTOMY/DECOMPRESSION MICRODISCECTOMY  07/24/2011   Procedure: LUMBAR LAMINECTOMY/DECOMPRESSION MICRODISCECTOMY;  Surgeon: Eustace Moore;  Location: [place] [facility];  Service: Neurosurgery;  Laterality: Bilateral;  Bilateral  , Lumbar Three-Four, Lumbar Four-Five Decompressive Laminectomy Rm # 32  . LUNG SURGERY     for empyema  . SHOULDER ARTHROSCOPY Right 07/05/2013   Procedure: RIGHT SHOULDER ARTHROSCOPY WITH DEBRIDEMENT;  Surgeon: Mcarthur Rossetti, MD;  Location: [place];  Service: Orthopedics;  Laterality: Right;  . TENDON TRANSFER Left 01/26/2014   Procedure: LEFT THUMB TRAPEZIECTOMY KNOTTED TENDON INTRAPOSITION TRANSFER OF ABDUCTOR POLLICIS LONGUS TO [place];  Surgeon: Cammie Sickle, MD;  Location: [place];   Service: Orthopedics;  Laterality: Left;  . THORACOTOMY  November 14 2010   rt    There were no vitals filed for this visit.      Subjective Assessment - 02/13/17 1040    Subjective Patient reports more back then hip pain at this point. He has been doing his exercises.    Pertinent History elbow surgery 2005; shoulder scope 2014; lumbar decompression 2014    Limitations Standing;Walking;House hold activities;Lifting   How long can you sit comfortably? No limit    How long can you stand comfortably? < 5 minutes before pain increases    How long can you walk comfortably? 200-300 feet in the community before pain increases    Diagnostic tests Venus doppler 12/22/2016 per ED notes (-)    Patient Stated Goals to have less pain with walking    Currently in Pain? Yes   Pain Score 4    Pain Location Hip   Pain Orientation Right   Pain Descriptors / Indicators Aching   Pain Type Surgical pain   Pain Onset More than a month ago   Pain Frequency Constant   Aggravating Factors  standing and walking    Pain Relieving Factors rest                         [facility] Adult PT Treatment/Exercise - 02/13/17 0001  Knee/Hip Exercises: Machines for Strengthening   Cybex Leg Press sled 7 2x10 80 lb 2x10 80 lb      Knee/Hip Exercises: Standing   Heel Raises Limitations 2x15 on hip machine    Lateral Step Up Limitations held   Forward Step Up Limitations held    Step Down Limitations held    Functional Squat Limitations 2x10 with cuing; consistently had popping    SLS 3x20 second hold    Other Standing Knee Exercises step onto air-ex 2x10 forward and 2x10 lateral; standing hip abduction 3x10;      Knee/Hip Exercises: Supine   Bridges Limitations 2x15   Straight Leg Raises Limitations 2x10    Other Supine Knee/Hip Exercises supine march                 PT Education - 02/13/17 1042    Education provided Yes   Education Details continue with current HEP    Person(s)  Educated Patient   Methods Explanation;Demonstration;Tactile cues   Comprehension Verbalized understanding;Returned demonstration;Verbal cues required;Tactile cues required          PT Short Term Goals - 02/01/17 2056      PT SHORT TERM GOAL #1   Title Patient will increase gross right lower extremity strangth to 4+/5    Baseline progressing    Time 4   Period Weeks   Status On-going     PT SHORT TERM GOAL #2   Title Patient will increase passive right hip flexion by 10 degrees    Time 4   Period Weeks   Status On-going     PT SHORT TERM GOAL #3   Title Patient will be independent with initial HEP    Time 4   Period Weeks   Status On-going     PT SHORT TERM GOAL #4   Title Patient will report 2/10 pain at worst in right hip    Time 4   Period Weeks   Status On-going           PT Long Term Goals - 02/13/17 1045      PT LONG TERM GOAL #1   Title Patient will ambualte 3000' with no device and miniaml pain and antalgic gait in order to ambualte in the community   Baseline antalgi gait until he wamrs up    Time 4   Period Weeks   Status On-going     PT LONG TERM GOAL #2   Title Patient will stand for 20 minutes without increased pain in order to perfrom ADL's    Time 8   Period Weeks   Status On-going     PT LONG TERM GOAL #3   Title Patient will increase active hip flexion to 90 degrees in order to walk and sit comfortably    Time 8   Period Weeks   Status On-going               Plan - 02/13/17 1043    Clinical Impression Statement Patient felt like he had to lean to the right to stay balanced in standing. This is likley 2nd to hip abductor weakness. therapy added standing hip abduction to his POC. His knee has been hurting so therapy held off steps. His last goal is for stewps so hopefully some rest will help his knee.    Clinical Presentation Evolving   Clinical Decision Making Moderate   Rehab Potential Good   PT Frequency 2x / week   PT  Duration  8 weeks   PT Treatment/Interventions ADLs/Self Care Home Management;Cryotherapy;Electrical Stimulation;Iontophoresis 37m/ml Dexamethasone;Traction;Moist Heat;Gait training;Stair training;Functional mobility training;Patient/family education;Therapeutic exercise;Therapeutic activities;Ultrasound;Manual techniques;Passive range of motion;Dry needling;Taping;Vasopneumatic Device   PT Next Visit Plan assess gait in slow mo: continue with exercises    PT Home Exercise Plan heel slide; glut set, quad set, LAQ    Consulted and Agree with Plan of Care Patient      Patient will benefit from skilled therapeutic intervention in order to improve the following deficits and impairments:  Abnormal gait, Decreased range of motion, Difficulty walking, Pain, Decreased endurance, Decreased knowledge of precautions, Decreased strength, Decreased mobility, Impaired sensation, Postural dysfunction  Visit Diagnosis: Pain in right hip  Stiffness of right hip, not elsewhere classified  Difficulty in walking, not elsewhere classified  Localized edema     Problem List Patient Active Problem List   Diagnosis Date Noted  . Impingement syndrome of right shoulder 07/05/2013  . Empyema lung ([place]   . Abscess of lung(513.0)   . Bipolar depression ([place]   . ADD (attention deficit disorder)   . Hepatitis C   . Lung mass 10/22/2010  . Pleural effusion 10/15/2010    DCarney LivingPT DPT  02/13/2017, 11:25 AM  [facility] [address]Phone: [phone]  Fax:  [phone] Name: Paul HEFFRONMRN: [id number] of Birth: [birth year]

## 2017-02-16 ENCOUNTER — Encounter: Payer: Self-pay | Admitting: Physical Therapy

## 2017-02-16 ENCOUNTER — Ambulatory Visit: Payer: BLUE CROSS/BLUE SHIELD | Admitting: Physical Therapy

## 2017-02-16 DIAGNOSIS — R600 Localized edema: Secondary | ICD-10-CM | POA: Diagnosis not present

## 2017-02-16 DIAGNOSIS — M25551 Pain in right hip: Secondary | ICD-10-CM | POA: Diagnosis not present

## 2017-02-16 DIAGNOSIS — R262 Difficulty in walking, not elsewhere classified: Secondary | ICD-10-CM

## 2017-02-16 DIAGNOSIS — M25651 Stiffness of right hip, not elsewhere classified: Secondary | ICD-10-CM

## 2017-02-16 MED FILL — DIVALPROEX SOD DR 500 MG TA: 500 | 90 days supply | Qty: 270 | Fill #0 | Status: TO

## 2017-02-16 NOTE — Therapy (Signed)
[place], [place], [address] Phone: [phone]   Fax:  [phone]  Physical Therapy Treatment  Patient Details  Name: Paul Ray MRN: [id number] Date of Birth: Dec 28, 1955 Referring Provider: Dr Sallyanne Havers   Encounter Date: 02/16/2017      PT End of Session - 02/16/17 0826    Visit Number 22   Number of Visits 30   Date for PT Re-Evaluation 03/29/17   Authorization Type BCBS  Ded met   PT Start Time 0800   PT Stop Time 0859   PT Time Calculation (min) 59 min   Activity Tolerance Patient tolerated treatment well   Behavior During Therapy [facility] for tasks assessed/performed      Past Medical History:  Diagnosis Date  . Abscess of lung(513.0)    Right lower lobe  . ADD (attention deficit disorder)   . Anxiety   . Arthritis   . Bipolar depression ([place])   . Empyema lung ([place])   . Hemorrhoids   . Hepatitis C   . Pneumonia    last year  . Prostate abscess   . Prostate troubles     Past Surgical History:  Procedure Laterality Date  . ELBOW SURGERY  2005   Right  . FOOT NEUROMA SURGERY  2006   Left  . HIP RESURFACING     left hip at [facility] 01/2011  . LUMBAR LAMINECTOMY/DECOMPRESSION MICRODISCECTOMY  07/24/2011   Procedure: LUMBAR LAMINECTOMY/DECOMPRESSION MICRODISCECTOMY;  Surgeon: Eustace Moore;  Location: [place] [facility];  Service: Neurosurgery;  Laterality: Bilateral;  Bilateral  , Lumbar Three-Four, Lumbar Four-Five Decompressive Laminectomy Rm # 32  . LUNG SURGERY     for empyema  . SHOULDER ARTHROSCOPY Right 07/05/2013   Procedure: RIGHT SHOULDER ARTHROSCOPY WITH DEBRIDEMENT;  Surgeon: Mcarthur Rossetti, MD;  Location: [place];  Service: Orthopedics;  Laterality: Right;  . TENDON TRANSFER Left 01/26/2014   Procedure: LEFT THUMB TRAPEZIECTOMY KNOTTED TENDON INTRAPOSITION TRANSFER OF ABDUCTOR POLLICIS LONGUS TO [place];  Surgeon: Cammie Sickle, MD;  Location: [place];   Service: Orthopedics;  Laterality: Left;  . THORACOTOMY  November 14 2010   rt    There were no vitals filed for this visit.      Subjective Assessment - 02/16/17 0808    Subjective Patient reports he was able to do some hiking over the weekend. He is sore but nothing unsual.    Pertinent History elbow surgery 2005; shoulder scope 2014; lumbar decompression 2014    Limitations Standing;Walking;House hold activities;Lifting   How long can you sit comfortably? No limit    How long can you stand comfortably? < 5 minutes before pain increases    How long can you walk comfortably? 200-300 feet in the community before pain increases    Diagnostic tests Venus doppler 12/22/2016 per ED notes (-)    Patient Stated Goals to have less pain with walking    Currently in Pain? Yes   Pain Score 4    Pain Location Hip   Pain Orientation Right   Pain Descriptors / Indicators Aching   Pain Type Surgical pain   Pain Onset More than a month ago   Pain Frequency Constant   Aggravating Factors  standing and walking    Pain Relieving Factors rest   Effect of Pain on Daily Activities difficulty perfroming ADL's  [place] Adult PT Treatment/Exercise - 02/16/17 0001      Knee/Hip Exercises: Machines for Strengthening   Cybex Leg Press sled 7 3x10 80 lb 2x10 80 lb      Knee/Hip Exercises: Standing   Heel Raises Limitations 2x15 on hip machine    Functional Squat Limitations 2x10 with cuing; continues to have popping when he goes too deep.    SLS 3x20 second hold    Other Standing Knee Exercises step onto air-ex 2x10 forward and 2x10 lateral; standing hip abduction 3x10;      Knee/Hip Exercises: Supine   Bridges Limitations 2x15   Straight Leg Raises Limitations 2x10    Other Supine Knee/Hip Exercises supine march 3x10    Other Supine Knee/Hip Exercises clam shell blue 2x10     Modalities   Modalities Cryotherapy     Cryotherapy   Number Minutes Cryotherapy 15  Minutes   Cryotherapy Location Hip   Type of Cryotherapy Ice pack     Manual Therapy   Manual therapy comments Gentle Passive ROM into flexion ER and IR. Careful not to push into pain or end ranges; IT band roll out                 PT Education - 02/16/17 0825    Education provided Yes   Education Details monitor symptoms with ambualtion but walk as much as able.    Person(s) Educated Patient   Methods Explanation;Tactile cues;Demonstration   Comprehension Verbalized understanding;Returned demonstration;Verbal cues required;Tactile cues required          PT Short Term Goals - 02/01/17 2056      PT SHORT TERM GOAL #1   Title Patient will increase gross right lower extremity strangth to 4+/5    Baseline progressing    Time 4   Period Weeks   Status On-going     PT SHORT TERM GOAL #2   Title Patient will increase passive right hip flexion by 10 degrees    Time 4   Period Weeks   Status On-going     PT SHORT TERM GOAL #3   Title Patient will be independent with initial HEP    Time 4   Period Weeks   Status On-going     PT SHORT TERM GOAL #4   Title Patient will report 2/10 pain at worst in right hip    Time 4   Period Weeks   Status On-going           PT Long Term Goals - 02/13/17 1045      PT LONG TERM GOAL #1   Title Patient will ambualte 3000' with no device and miniaml pain and antalgic gait in order to ambualte in the community   Baseline antalgi gait until he wamrs up    Time 4   Period Weeks   Status On-going     PT LONG TERM GOAL #2   Title Patient will stand for 20 minutes without increased pain in order to perfrom ADL's    Time 8   Period Weeks   Status On-going     PT LONG TERM GOAL #3   Title Patient will increase active hip flexion to 90 degrees in order to walk and sit comfortably    Time 8   Period Weeks   Status On-going               Plan - 02/16/17 2130    Clinical Impression Statement Therapy continued to focus  on lateral hip strengthening. His single leg stance has improved. Therapy will continue to progress the patient as tolerated.    History and Personal Factors relevant to plan of care: fluctuating pain and stiffness    Clinical Presentation Evolving   Clinical Presentation due to: pain and stiffness    Clinical Decision Making Moderate   Rehab Potential Good   PT Frequency 2x / week   PT Duration 8 weeks   PT Treatment/Interventions ADLs/Self Care Home Management;Cryotherapy;Electrical Stimulation;Iontophoresis 41m/ml Dexamethasone;Traction;Moist Heat;Gait training;Stair training;Functional mobility training;Patient/family education;Therapeutic exercise;Therapeutic activities;Ultrasound;Manual techniques;Passive range of motion;Dry needling;Taping;Vasopneumatic Device   PT Next Visit Plan assess gait in slow mo: continue with exercises    PT Home Exercise Plan heel slide; glut set, quad set, LAQ    Consulted and Agree with Plan of Care Patient      Patient will benefit from skilled therapeutic intervention in order to improve the following deficits and impairments:  Abnormal gait, Decreased range of motion, Difficulty walking, Pain, Decreased endurance, Decreased knowledge of precautions, Decreased strength, Decreased mobility, Impaired sensation, Postural dysfunction  Visit Diagnosis: Pain in right hip  Stiffness of right hip, not elsewhere classified  Difficulty in walking, not elsewhere classified  Localized edema     Problem List Patient Active Problem List   Diagnosis Date Noted  . Impingement syndrome of right shoulder 07/05/2013  . Empyema lung ([place]   . Abscess of lung(513.0)   . Bipolar depression ([place]   . ADD (attention deficit disorder)   . Hepatitis C   . Lung mass 10/22/2010  . Pleural effusion 10/15/2010    DCarney LivingPT DPT  02/16/2017, 9:08 AM  [facility]  [address]Phone: [phone]  Fax:  [phone] Name: Paul SIANMRN: [id number] of Birth: 5Aug 03, 1957

## 2017-02-17 ENCOUNTER — Ambulatory Visit: Payer: BLUE CROSS/BLUE SHIELD | Admitting: Physical Therapy

## 2017-02-17 DIAGNOSIS — R262 Difficulty in walking, not elsewhere classified: Secondary | ICD-10-CM

## 2017-02-17 DIAGNOSIS — R600 Localized edema: Secondary | ICD-10-CM | POA: Diagnosis not present

## 2017-02-17 DIAGNOSIS — M25651 Stiffness of right hip, not elsewhere classified: Secondary | ICD-10-CM | POA: Diagnosis not present

## 2017-02-17 DIAGNOSIS — M25551 Pain in right hip: Secondary | ICD-10-CM

## 2017-02-17 NOTE — Therapy (Signed)
[place], [place], [address] Phone: [phone]   Fax:  [phone]  Physical Therapy Treatment  Patient Details  Name: Paul Ray MRN: [id number] Date of Birth: Jul 01, 1956 Referring Provider: Dr Sallyanne Havers   Encounter Date: 02/17/2017      PT End of Session - 02/17/17 1016    Visit Number 23   Number of Visits 30   Date for PT Re-Evaluation 03/29/17   Authorization Type BCBS  Ded met   PT Start Time 1008   PT Stop Time 1115   PT Time Calculation (min) 67 min   Activity Tolerance Patient tolerated treatment well   Behavior During Therapy [facility] for tasks assessed/performed      Past Medical History:  Diagnosis Date  . Abscess of lung(513.0)    Right lower lobe  . ADD (attention deficit disorder)   . Anxiety   . Arthritis   . Bipolar depression ([place])   . Empyema lung ([place])   . Hemorrhoids   . Hepatitis C   . Pneumonia    last year  . Prostate abscess   . Prostate troubles     Past Surgical History:  Procedure Laterality Date  . ELBOW SURGERY  2005   Right  . FOOT NEUROMA SURGERY  2006   Left  . HIP RESURFACING     left hip at [facility] 01/2011  . LUMBAR LAMINECTOMY/DECOMPRESSION MICRODISCECTOMY  07/24/2011   Procedure: LUMBAR LAMINECTOMY/DECOMPRESSION MICRODISCECTOMY;  Surgeon: Eustace Moore;  Location: [place] [facility];  Service: Neurosurgery;  Laterality: Bilateral;  Bilateral  , Lumbar Three-Four, Lumbar Four-Five Decompressive Laminectomy Rm # 32  . LUNG SURGERY     for empyema  . SHOULDER ARTHROSCOPY Right 07/05/2013   Procedure: RIGHT SHOULDER ARTHROSCOPY WITH DEBRIDEMENT;  Surgeon: Mcarthur Rossetti, MD;  Location: [place];  Service: Orthopedics;  Laterality: Right;  . TENDON TRANSFER Left 01/26/2014   Procedure: LEFT THUMB TRAPEZIECTOMY KNOTTED TENDON INTRAPOSITION TRANSFER OF ABDUCTOR POLLICIS LONGUS TO [place];  Surgeon: Cammie Sickle, MD;  Location: [place];   Service: Orthopedics;  Laterality: Left;  . THORACOTOMY  November 14 2010   rt    There were no vitals filed for this visit.      Subjective Assessment - 02/17/17 1013    Subjective Patient reports he feels like he strained his groin yesterday lifting something.    Pertinent History elbow surgery 2005; shoulder scope 2014; lumbar decompression 2014    Limitations Standing;Walking;House hold activities;Lifting   How long can you sit comfortably? No limit    How long can you stand comfortably? < 5 minutes before pain increases    How long can you walk comfortably? 200-300 feet in the community before pain increases    Diagnostic tests Venus doppler 12/22/2016 per ED notes (-)    Patient Stated Goals to have less pain with walking    Currently in Pain? Yes   Pain Score 5    Pain Location Hip   Pain Orientation Right   Pain Descriptors / Indicators Aching   Pain Type Surgical pain   Pain Radiating Towards into the groin    Pain Onset More than a month ago   Pain Frequency Constant   Aggravating Factors  standing and walking    Pain Relieving Factors rest    Effect of Pain on Daily Activities diffiuclty performing ADL's  [place] Adult PT Treatment/Exercise - 02/17/17 0001      Knee/Hip Exercises: Machines for Strengthening   Cybex Leg Press sled 7 3x10 80 lb 2x10 80 lb      Knee/Hip Exercises: Standing   Heel Raises Limitations 2x15 on hip machine    Functional Squat Limitations 2x10 with cuing; continues to have popping when he goes too deep.    SLS 3x20 second hold    Other Standing Knee Exercises step onto air-ex 2x10 forward and 2x10 lateral; standing hip abduction 3x10;    Other Standing Knee Exercises 3 way hip 2x10 ea ch leg      Knee/Hip Exercises: Supine   Other Supine Knee/Hip Exercises supine march 3x10    Other Supine Knee/Hip Exercises clam shell blue 2x10     Modalities   Modalities Cryotherapy     Cryotherapy   Number  Minutes Cryotherapy 15 Minutes   Cryotherapy Location Hip   Type of Cryotherapy Ice pack     Manual Therapy   Manual therapy comments Gentle Passive ROM into flexion ER and IR. Careful not to push into pain or end ranges; IT band roll out                 PT Education - 02/17/17 1016    Education provided Yes   Education Details continue to monitor symptoms    Person(s) Educated Patient   Methods Explanation;Demonstration;Tactile cues   Comprehension Returned demonstration;Verbalized understanding;Verbal cues required;Tactile cues required          PT Short Term Goals - 02/17/17 1043      PT SHORT TERM GOAL #1   Title Patient will increase gross right lower extremity strangth to 4+/5    Baseline progressing    Time 4   Period Weeks   Status On-going     PT SHORT TERM GOAL #2   Title Patient will increase passive right hip flexion by 10 degrees    Time 4   Period Weeks   Status On-going     PT SHORT TERM GOAL #3   Title Patient will be independent with initial HEP    Time 4   Period Weeks   Status On-going     PT SHORT TERM GOAL #4   Title Patient will report 2/10 pain at worst in right hip    Time 4   Period Weeks   Status On-going           PT Long Term Goals - 02/13/17 1045      PT LONG TERM GOAL #1   Title Patient will ambualte 3000' with no device and miniaml pain and antalgic gait in order to ambualte in the community   Baseline antalgi gait until he wamrs up    Time 4   Period Weeks   Status On-going     PT LONG TERM GOAL #2   Title Patient will stand for 20 minutes without increased pain in order to perfrom ADL's    Time 8   Period Weeks   Status On-going     PT LONG TERM GOAL #3   Title Patient will increase active hip flexion to 90 degrees in order to walk and sit comfortably    Time 8   Period Weeks   Status On-going               Plan - 02/17/17 1033    Clinical Impression Statement Therapy scald back exercises 2nd  to groin strain. He  had no increase in pain. He continues to have some popping in his hip. Therapy will continue to progress him as tolerated.    Clinical Presentation Evolving   Clinical Presentation due to: pain and stiffness    Clinical Decision Making Moderate   Rehab Potential Good   PT Frequency 2x / week   PT Duration 8 weeks   PT Treatment/Interventions ADLs/Self Care Home Management;Cryotherapy;Electrical Stimulation;Iontophoresis 68m/ml Dexamethasone;Traction;Moist Heat;Gait training;Stair training;Functional mobility training;Patient/family education;Therapeutic exercise;Therapeutic activities;Ultrasound;Manual techniques;Passive range of motion;Dry needling;Taping;Vasopneumatic Device   PT Next Visit Plan assess gait in slow mo: continue with exercises    PT Home Exercise Plan heel slide; glut set, quad set, LAQ    Consulted and Agree with Plan of Care Patient      Patient will benefit from skilled therapeutic intervention in order to improve the following deficits and impairments:  Abnormal gait, Decreased range of motion, Difficulty walking, Pain, Decreased endurance, Decreased knowledge of precautions, Decreased strength, Decreased mobility, Impaired sensation, Postural dysfunction  Visit Diagnosis: Pain in right hip  Stiffness of right hip, not elsewhere classified  Difficulty in walking, not elsewhere classified  Localized edema     Problem List Patient Active Problem List   Diagnosis Date Noted  . Impingement syndrome of right shoulder 07/05/2013  . Empyema lung ([place]   . Abscess of lung(513.0)   . Bipolar depression ([place]   . ADD (attention deficit disorder)   . Hepatitis C   . Lung mass 10/22/2010  . Pleural effusion 10/15/2010    DCarney LivingPT DPT  02/17/2017, 5:06 PM  [facility] [place] [address]Phone: [phone]  Fax:  [phone] Name: DDANIELLE LENTOMRN:  [id number] of Birth: 508-02-1956

## 2017-02-18 ENCOUNTER — Ambulatory Visit: Payer: BLUE CROSS/BLUE SHIELD | Admitting: Physical Therapy

## 2017-02-18 ENCOUNTER — Encounter: Payer: Self-pay | Admitting: Physical Therapy

## 2017-02-18 DIAGNOSIS — R262 Difficulty in walking, not elsewhere classified: Secondary | ICD-10-CM

## 2017-02-18 DIAGNOSIS — R600 Localized edema: Secondary | ICD-10-CM | POA: Diagnosis not present

## 2017-02-18 DIAGNOSIS — S90862A Insect bite (nonvenomous), left foot, initial encounter: Secondary | ICD-10-CM | POA: Diagnosis not present

## 2017-02-18 DIAGNOSIS — S30863S Insect bite (nonvenomous) of scrotum and testes, sequela: Secondary | ICD-10-CM | POA: Diagnosis not present

## 2017-02-18 DIAGNOSIS — M25651 Stiffness of right hip, not elsewhere classified: Secondary | ICD-10-CM

## 2017-02-18 DIAGNOSIS — M25551 Pain in right hip: Secondary | ICD-10-CM

## 2017-02-18 DIAGNOSIS — S90861A Insect bite (nonvenomous), right foot, initial encounter: Secondary | ICD-10-CM | POA: Diagnosis not present

## 2017-02-18 NOTE — Therapy (Signed)
[place], [place], [address] Phone: [phone]   Fax:  [phone]  Physical Therapy Treatment  Patient Details  Name: Paul Ray MRN: [id number] Date of Birth: November 18, 1955 Referring Provider: Dr Sallyanne Havers   Encounter Date: 02/18/2017      PT End of Session - 02/18/17 1003    Visit Number 24   Number of Visits 30   Date for PT Re-Evaluation 03/29/17   Authorization Type BCBS  Ded met   PT Start Time 0936   PT Stop Time 1015   PT Time Calculation (min) 39 min   Activity Tolerance Patient tolerated treatment well   Behavior During Therapy [facility] for tasks assessed/performed      Past Medical History:  Diagnosis Date  . Abscess of lung(513.0)    Right lower lobe  . ADD (attention deficit disorder)   . Anxiety   . Arthritis   . Bipolar depression ([place])   . Empyema lung ([place])   . Hemorrhoids   . Hepatitis C   . Pneumonia    last year  . Prostate abscess   . Prostate troubles     Past Surgical History:  Procedure Laterality Date  . ELBOW SURGERY  2005   Right  . FOOT NEUROMA SURGERY  2006   Left  . HIP RESURFACING     left hip at [facility] 01/2011  . LUMBAR LAMINECTOMY/DECOMPRESSION MICRODISCECTOMY  07/24/2011   Procedure: LUMBAR LAMINECTOMY/DECOMPRESSION MICRODISCECTOMY;  Surgeon: Eustace Moore;  Location: [place] [facility];  Service: Neurosurgery;  Laterality: Bilateral;  Bilateral  , Lumbar Three-Four, Lumbar Four-Five Decompressive Laminectomy Rm # 32  . LUNG SURGERY     for empyema  . SHOULDER ARTHROSCOPY Right 07/05/2013   Procedure: RIGHT SHOULDER ARTHROSCOPY WITH DEBRIDEMENT;  Surgeon: Mcarthur Rossetti, MD;  Location: [place];  Service: Orthopedics;  Laterality: Right;  . TENDON TRANSFER Left 01/26/2014   Procedure: LEFT THUMB TRAPEZIECTOMY KNOTTED TENDON INTRAPOSITION TRANSFER OF ABDUCTOR POLLICIS LONGUS TO [place];  Surgeon: Cammie Sickle, MD;  Location: [place];   Service: Orthopedics;  Laterality: Left;  . THORACOTOMY  November 14 2010   rt    There were no vitals filed for this visit.      Subjective Assessment - 02/18/17 0955    Subjective Patient reports his hip ahs been a little better since yesterday. He is not as stiff this morning.    Pertinent History elbow surgery 2005; shoulder scope 2014; lumbar decompression 2014    Limitations Standing;Walking;House hold activities;Lifting   How long can you sit comfortably? No limit    How long can you stand comfortably? < 5 minutes before pain increases    How long can you walk comfortably? 200-300 feet in the community before pain increases    Diagnostic tests Venus doppler 12/22/2016 per ED notes (-)    Patient Stated Goals to have less pain with walking    Currently in Pain? Yes   Pain Score 4    Pain Location Hip   Pain Orientation Right   Pain Descriptors / Indicators Aching   Pain Type Surgical pain   Pain Onset More than a month ago   Pain Frequency Constant   Aggravating Factors  standing and walking    Pain Relieving Factors rest    Effect of Pain on Daily Activities difficulty perfroming ADL's  [place] Adult PT Treatment/Exercise - 02/18/17 0001      Knee/Hip Exercises: Machines for Strengthening   Cybex Leg Press sled 7 3x10 80 lb 2x10 80 lb      Knee/Hip Exercises: Standing   Heel Raises Limitations 2x15 on hip machine    Functional Squat Limitations 2x10 with cuing; continues to have popping when he goes too deep.    SLS 3x20 second hold    Other Standing Knee Exercises step onto air-ex 2x10 forward and 2x10 lateral; standing hip abduction 3x10;    Other Standing Knee Exercises 3 way hip 2x10 ea ch leg      Knee/Hip Exercises: Supine   Other Supine Knee/Hip Exercises supine march 3x10    Other Supine Knee/Hip Exercises clam shell blue 2x10     Modalities   Modalities Cryotherapy     Cryotherapy   Number Minutes Cryotherapy 15  Minutes   Cryotherapy Location Hip   Type of Cryotherapy Ice pack     Manual Therapy   Manual therapy comments Gentle Passive ROM into flexion ER and IR. Careful not to push into pain or end ranges; IT band roll out                 PT Education - 02/18/17 1259    Education provided Yes   Education Details symptom mamgement, controlling knee pain;    Person(s) Educated Patient   Methods Explanation;Demonstration;Tactile cues   Comprehension Verbalized understanding;Returned demonstration;Verbal cues required;Tactile cues required          PT Short Term Goals - 02/17/17 1043      PT SHORT TERM GOAL #1   Title Patient will increase gross right lower extremity strangth to 4+/5    Baseline progressing    Time 4   Period Weeks   Status On-going     PT SHORT TERM GOAL #2   Title Patient will increase passive right hip flexion by 10 degrees    Time 4   Period Weeks   Status On-going     PT SHORT TERM GOAL #3   Title Patient will be independent with initial HEP    Time 4   Period Weeks   Status On-going     PT SHORT TERM GOAL #4   Title Patient will report 2/10 pain at worst in right hip    Time 4   Period Weeks   Status On-going           PT Long Term Goals - 02/13/17 1045      PT LONG TERM GOAL #1   Title Patient will ambualte 3000' with no device and miniaml pain and antalgic gait in order to ambualte in the community   Baseline antalgi gait until he wamrs up    Time 4   Period Weeks   Status On-going     PT LONG TERM GOAL #2   Title Patient will stand for 20 minutes without increased pain in order to perfrom ADL's    Time 8   Period Weeks   Status On-going     PT LONG TERM GOAL #3   Title Patient will increase active hip flexion to 90 degrees in order to walk and sit comfortably    Time 8   Period Weeks   Status On-going               Plan - 02/18/17 1006    Clinical Impression Statement Therapy still held off straight leg raises.  He  reported some left hip pain with stability exercises. Therapy continues to focus on improving lateral hip strength. Therapy will assess gait next visit. Patient advised to continue strethcing and strengthening over the next week.    History and Personal Factors relevant to plan of care: fluctuating pain and stiffness    Clinical Presentation Evolving   Clinical Decision Making Moderate   PT Treatment/Interventions ADLs/Self Care Home Management;Cryotherapy;Electrical Stimulation;Iontophoresis 29m/ml Dexamethasone;Traction;Moist Heat;Gait training;Stair training;Functional mobility training;Patient/family education;Therapeutic exercise;Therapeutic activities;Ultrasound;Manual techniques;Passive range of motion;Dry needling;Taping;Vasopneumatic Device   PT Next Visit Plan assess gait in slow mo: continue with exercises    PT Home Exercise Plan heel slide; glut set, quad set, LAQ    Consulted and Agree with Plan of Care Patient      Patient will benefit from skilled therapeutic intervention in order to improve the following deficits and impairments:  Abnormal gait, Decreased range of motion, Difficulty walking, Pain, Decreased endurance, Decreased knowledge of precautions, Decreased strength, Decreased mobility, Impaired sensation, Postural dysfunction  Visit Diagnosis: Pain in right hip  Stiffness of right hip, not elsewhere classified  Difficulty in walking, not elsewhere classified  Localized edema     Problem List Patient Active Problem List   Diagnosis Date Noted  . Impingement syndrome of right shoulder 07/05/2013  . Empyema lung ([place]   . Abscess of lung(513.0)   . Bipolar depression ([place]   . ADD (attention deficit disorder)   . Hepatitis C   . Lung mass 10/22/2010  . Pleural effusion 10/15/2010    DCarney LivingPT DPT  02/18/2017, 1:01 PM  [facility] [place] [address]Phone: [phone]   Fax:  [phone] Name: Paul RALSTONMRN: [id number] of Birth: 505/03/1956

## 2017-02-20 MED FILL — CEPHALEXIN 500 MG CAPSULE: 500 | 7 days supply | Qty: 21 | Fill #0

## 2017-02-24 ENCOUNTER — Encounter: Payer: Self-pay | Admitting: Physical Therapy

## 2017-02-24 ENCOUNTER — Ambulatory Visit: Payer: BLUE CROSS/BLUE SHIELD | Admitting: Physical Therapy

## 2017-02-24 DIAGNOSIS — R262 Difficulty in walking, not elsewhere classified: Secondary | ICD-10-CM | POA: Diagnosis not present

## 2017-02-24 DIAGNOSIS — M25651 Stiffness of right hip, not elsewhere classified: Secondary | ICD-10-CM

## 2017-02-24 DIAGNOSIS — R600 Localized edema: Secondary | ICD-10-CM | POA: Diagnosis not present

## 2017-02-24 DIAGNOSIS — M25551 Pain in right hip: Secondary | ICD-10-CM | POA: Diagnosis not present

## 2017-02-24 NOTE — Therapy (Signed)
[place], [place], [address] Phone: [phone]   Fax:  [phone]  Physical Therapy Treatment  Patient Details  Name: Paul Ray MRN: [id number] Date of Birth: [birth year] Referring Provider: Dr Sallyanne Havers   Encounter Date: 02/24/2017      PT End of Session - 02/24/17 0917    Visit Number 25   Number of Visits 30   Date for PT Re-Evaluation 03/29/17   Authorization Type BCBS  Ded met   PT Start Time 0853   PT Stop Time 0931   PT Time Calculation (min) 38 min   Activity Tolerance Patient tolerated treatment well   Behavior During Therapy [facility] for tasks assessed/performed      Past Medical History:  Diagnosis Date  . Abscess of lung(513.0)    Right lower lobe  . ADD (attention deficit disorder)   . Anxiety   . Arthritis   . Bipolar depression ([place])   . Empyema lung ([place])   . Hemorrhoids   . Hepatitis C   . Pneumonia    last year  . Prostate abscess   . Prostate troubles     Past Surgical History:  Procedure Laterality Date  . ELBOW SURGERY  2005   Right  . FOOT NEUROMA SURGERY  2006   Left  . HIP RESURFACING     left hip at [facility] 01/2011  . LUMBAR LAMINECTOMY/DECOMPRESSION MICRODISCECTOMY  07/24/2011   Procedure: LUMBAR LAMINECTOMY/DECOMPRESSION MICRODISCECTOMY;  Surgeon: Eustace Moore;  Location: [place] [facility];  Service: Neurosurgery;  Laterality: Bilateral;  Bilateral  , Lumbar Three-Four, Lumbar Four-Five Decompressive Laminectomy Rm # 32  . LUNG SURGERY     for empyema  . SHOULDER ARTHROSCOPY Right 07/05/2013   Procedure: RIGHT SHOULDER ARTHROSCOPY WITH DEBRIDEMENT;  Surgeon: Mcarthur Rossetti, MD;  Location: [place];  Service: Orthopedics;  Laterality: Right;  . TENDON TRANSFER Left 01/26/2014   Procedure: LEFT THUMB TRAPEZIECTOMY KNOTTED TENDON INTRAPOSITION TRANSFER OF ABDUCTOR POLLICIS LONGUS TO Elias-Fela Solis;  Surgeon: Cammie Sickle, MD;  Location: [place];   Service: Orthopedics;  Laterality: Left;  . THORACOTOMY  November 14 2010   rt    There were no vitals filed for this visit.      Subjective Assessment - 02/24/17 0859    Subjective Patient reports his hip has been alright. Some days he feels like it has been OK, some days it still feels painful.    Pertinent History elbow surgery 2005; shoulder scope 2014; lumbar decompression 2014    Limitations Standing;Walking;House hold activities;Lifting   How long can you sit comfortably? No limit    How long can you stand comfortably? < 5 minutes before pain increases    How long can you walk comfortably? 200-300 feet in the community before pain increases    Diagnostic tests Venus doppler 12/22/2016 per ED notes (-)    Patient Stated Goals to have less pain with walking    Currently in Pain? Yes   Pain Score 3    Pain Location Hip   Pain Orientation Right   Pain Descriptors / Indicators Aching   Pain Type Surgical pain   Pain Onset More than a month ago   Pain Frequency Constant   Aggravating Factors  standing and walking    Pain Relieving Factors rest    Effect of Pain on Daily Activities difficulty perfroming ADL's  [place] Adult PT Treatment/Exercise - 02/24/17 0001      Knee/Hip Exercises: Machines for Strengthening   Cybex Leg Press sled 7 3x10 80 lb 2x10 80 lb      Knee/Hip Exercises: Standing   Heel Raises Limitations 2x15 on hip machine    Functional Squat Limitations 2x10 with cuing; continues to have popping when he goes too deep.    Other Standing Knee Exercises lateral band walk cross yellow 2x10    Other Standing Knee Exercises 3 way hip 2x10 ea ch leg      Knee/Hip Exercises: Supine   Bridges Limitations 2x15   Other Supine Knee/Hip Exercises supine march 3x10    Other Supine Knee/Hip Exercises clam shell blue 2x10     Modalities   Modalities Cryotherapy     Cryotherapy   Number Minutes Cryotherapy 15 Minutes   Cryotherapy  Location Hip   Type of Cryotherapy Ice pack     Manual Therapy   Manual therapy comments Gentle Passive ROM into flexion ER and IR. Careful not to push into pain or end ranges; IT band roll out                 PT Education - 02/24/17 0917    Education provided Yes   Education Details continue with current exercises.    Person(s) Educated Patient   Methods Explanation;Demonstration;Tactile cues   Comprehension Verbalized understanding;Returned demonstration;Verbal cues required;Tactile cues required          PT Short Term Goals - 02/24/17 0953      PT SHORT TERM GOAL #1   Title Patient will increase gross right lower extremity strangth to 4+/5    Baseline progressing    Time 4   Period Weeks   Status On-going     PT SHORT TERM GOAL #2   Title Patient will increase passive right hip flexion by 10 degrees    Time 4   Period Weeks   Status On-going     PT SHORT TERM GOAL #3   Title Patient will be independent with initial HEP    Time 4   Period Weeks   Status On-going     PT SHORT TERM GOAL #4   Title Patient will report 2/10 pain at worst in right hip    Time 4   Period Weeks   Status On-going           PT Long Term Goals - 02/13/17 1045      PT LONG TERM GOAL #1   Title Patient will ambualte 3000' with no device and miniaml pain and antalgic gait in order to ambualte in the community   Baseline antalgi gait until he wamrs up    Time 4   Period Weeks   Status On-going     PT LONG TERM GOAL #2   Title Patient will stand for 20 minutes without increased pain in order to perfrom ADL's    Time 8   Period Weeks   Status On-going     PT LONG TERM GOAL #3   Title Patient will increase active hip flexion to 90 degrees in order to walk and sit comfortably    Time 8   Period Weeks   Status On-going               Plan - 02/24/17 6962    Clinical Impression Statement Patient is making progress. He is atrating to have days were he is not  having to much  pain at all. He cotninues to have problems with steps. Patient was late today so steps not perfromed 2nd to time limitations.    Clinical Presentation Evolving   Clinical Decision Making Moderate   PT Frequency 2x / week   PT Duration 8 weeks   PT Treatment/Interventions ADLs/Self Care Home Management;Cryotherapy;Electrical Stimulation;Iontophoresis 26m/ml Dexamethasone;Traction;Moist Heat;Gait training;Stair training;Functional mobility training;Patient/family education;Therapeutic exercise;Therapeutic activities;Ultrasound;Manual techniques;Passive range of motion;Dry needling;Taping;Vasopneumatic Device   PT Next Visit Plan assess gait in slow mo: continue with exercises    PT Home Exercise Plan heel slide; glut set, quad set, LAQ    Consulted and Agree with Plan of Care Patient      Patient will benefit from skilled therapeutic intervention in order to improve the following deficits and impairments:  Abnormal gait, Decreased range of motion, Difficulty walking, Pain, Decreased endurance, Decreased knowledge of precautions, Decreased strength, Decreased mobility, Impaired sensation, Postural dysfunction  Visit Diagnosis: Pain in right hip  Stiffness of right hip, not elsewhere classified  Difficulty in walking, not elsewhere classified  Localized edema     Problem List Patient Active Problem List   Diagnosis Date Noted  . Impingement syndrome of right shoulder 07/05/2013  . Empyema lung ([place]   . Abscess of lung(513.0)   . Bipolar depression ([place]   . ADD (attention deficit disorder)   . Hepatitis C   . Lung mass 10/22/2010  . Pleural effusion 10/15/2010    DCarney LivingPT DPT  02/24/2017, 9:55 AM  [facility] [address]Phone: [phone]  Fax:  [phone] Name: DSURAFEL HILLEARYMRN: [id number] of Birth: 502-10-1955

## 2017-02-26 ENCOUNTER — Ambulatory Visit: Payer: BLUE CROSS/BLUE SHIELD | Attending: Orthopedic Surgery | Admitting: Physical Therapy

## 2017-02-26 ENCOUNTER — Encounter: Payer: Self-pay | Admitting: Physical Therapy

## 2017-02-26 DIAGNOSIS — M25651 Stiffness of right hip, not elsewhere classified: Secondary | ICD-10-CM | POA: Diagnosis not present

## 2017-02-26 DIAGNOSIS — R262 Difficulty in walking, not elsewhere classified: Secondary | ICD-10-CM

## 2017-02-26 DIAGNOSIS — R600 Localized edema: Secondary | ICD-10-CM | POA: Insufficient documentation

## 2017-02-26 DIAGNOSIS — M25551 Pain in right hip: Secondary | ICD-10-CM | POA: Diagnosis not present

## 2017-02-26 MED FILL — HYDROmorphone HCL 4 MG TABS: 4 | 30 days supply | Qty: 180 | Fill #0

## 2017-02-26 MED FILL — MORPHINE SULF 60 MG TAB SA: 60 | 30 days supply | Qty: 60 | Fill #0

## 2017-02-26 NOTE — Therapy (Signed)
[place], [place], [address] Phone: [phone]   Fax:  [phone]  Physical Therapy Treatment  Patient Details  Name: Paul Ray MRN: [id number] Date of Birth: 03-22-1956 Referring Provider: Dr Sallyanne Havers   Encounter Date: 02/26/2017      PT End of Session - 02/26/17 0832    Visit Number 26   Number of Visits 30   Date for PT Re-Evaluation 03/29/17   Authorization Type BCBS  Ded met   PT Start Time 0812   PT Stop Time 0908   PT Time Calculation (min) 56 min   Activity Tolerance Patient tolerated treatment well   Behavior During Therapy [facility] for tasks assessed/performed      Past Medical History:  Diagnosis Date  . Abscess of lung(513.0)    Right lower lobe  . ADD (attention deficit disorder)   . Anxiety   . Arthritis   . Bipolar depression ([place])   . Empyema lung ([place])   . Hemorrhoids   . Hepatitis C   . Pneumonia    last year  . Prostate abscess   . Prostate troubles     Past Surgical History:  Procedure Laterality Date  . ELBOW SURGERY  2005   Right  . FOOT NEUROMA SURGERY  2006   Left  . HIP RESURFACING     left hip at [facility] 01/2011  . LUMBAR LAMINECTOMY/DECOMPRESSION MICRODISCECTOMY  07/24/2011   Procedure: LUMBAR LAMINECTOMY/DECOMPRESSION MICRODISCECTOMY;  Surgeon: Eustace Moore;  Location: [place] NEUR[facility]S;  Service: Neurosurgery;  Laterality: Bilateral;  Bilateral  , Lumbar Three-Four, Lumbar Four-Five Decompressive Laminectomy Rm # 32  . LUNG SURGERY     for empyema  . SHOULDER ARTHROSCOPY Right 07/05/2013   Procedure: RIGHT SHOULDER ARTHROSCOPY WITH DEBRIDEMENT;  Surgeon: Mcarthur Rossetti, MD;  Location: [place];  Service: Orthopedics;  Laterality: Right;  . TENDON TRANSFER Left 01/26/2014   Procedure: LEFT THUMB TRAPEZIECTOMY KNOTTED TENDON INTRAPOSITION TRANSFER OF ABDUCTOR POLLICIS LONGUS TO [place];  Surgeon: Cammie Sickle, MD;  Location: [place];   Service: Orthopedics;  Laterality: Left;  . THORACOTOMY  November 14 2010   rt    There were no vitals filed for this visit.      Subjective Assessment - 02/26/17 2021    Subjective Patient reports his hip has been feeling better. He is having very little pain at this time.    Pertinent History elbow surgery 2005; shoulder scope 2014; lumbar decompression 2014    Limitations Standing;Walking;House hold activities;Lifting   How long can you sit comfortably? No limit    How long can you stand comfortably? < 5 minutes before pain increases    How long can you walk comfortably? 200-300 feet in the community before pain increases    Diagnostic tests Venus doppler 12/22/2016 per ED notes (-)    Patient Stated Goals to have less pain with walking    Currently in Pain? Yes   Pain Score 2    Pain Location Hip   Pain Orientation Right   Pain Descriptors / Indicators Aching   Pain Type Surgical pain   Pain Radiating Towards into the groin    Pain Onset More than a month ago   Pain Frequency Constant   Aggravating Factors  standing and walking    Pain Relieving Factors rest    Effect of Pain on Daily Activities difficulty perfroming ADL's  [place] Adult PT Treatment/Exercise - 02/26/17 0001      Knee/Hip Exercises: Machines for Strengthening   Cybex Leg Press sled 7 3x10 80 lb 2x10 80 lb      Knee/Hip Exercises: Standing   Heel Raises Limitations 2x15 on hip machine    Forward Step Up Limitations 6 2x10    Step Down Limitations 6 2x10    Functional Squat Limitations 2x10 with cuing; continues to have popping when he goes too deep.    Other Standing Knee Exercises 3 way hip 2x10 ea ch leg      Knee/Hip Exercises: Supine   Bridges Limitations 2x15   Other Supine Knee/Hip Exercises supine march 3x10    Other Supine Knee/Hip Exercises clam shell blue 2x10     Modalities   Modalities Cryotherapy     Cryotherapy   Number Minutes Cryotherapy 15  Minutes   Cryotherapy Location Hip   Type of Cryotherapy Ice pack     Manual Therapy   Manual therapy comments Gentle Passive ROM into flexion ER and IR. Careful not to push into pain or end ranges; IT band roll out                 PT Education - 02/26/17 2024    Education provided Yes   Education Details cocontinue with HEP    Person(s) Educated Patient   Methods Explanation;Demonstration;Tactile cues;Verbal cues   Comprehension Verbalized understanding;Returned demonstration;Verbal cues required;Tactile cues required          PT Short Term Goals - 02/24/17 0953      PT SHORT TERM GOAL #1   Title Patient will increase gross right lower extremity strangth to 4+/5    Baseline progressing    Time 4   Period Weeks   Status On-going     PT SHORT TERM GOAL #2   Title Patient will increase passive right hip flexion by 10 degrees    Time 4   Period Weeks   Status On-going     PT SHORT TERM GOAL #3   Title Patient will be independent with initial HEP    Time 4   Period Weeks   Status On-going     PT SHORT TERM GOAL #4   Title Patient will report 2/10 pain at worst in right hip    Time 4   Period Weeks   Status On-going           PT Long Term Goals - 02/13/17 1045      PT LONG TERM GOAL #1   Title Patient will ambualte 3000' with no device and miniaml pain and antalgic gait in order to ambualte in the community   Baseline antalgi gait until he wamrs up    Time 4   Period Weeks   Status On-going     PT LONG TERM GOAL #2   Title Patient will stand for 20 minutes without increased pain in order to perfrom ADL's    Time 8   Period Weeks   Status On-going     PT LONG TERM GOAL #3   Title Patient will increase active hip flexion to 90 degrees in order to walk and sit comfortably    Time 8   Period Weeks   Status On-going               Plan - 02/26/17 2027    Clinical Impression Statement Patient is making good progress. Therapy added stairs  back in today he had  no pain. He continues to work on his exercises at home. Anticipate discharge soon to HEP.    Clinical Presentation Stable   Clinical Decision Making Moderate   Rehab Potential Good   PT Frequency 2x / week   PT Duration 8 weeks   PT Treatment/Interventions ADLs/Self Care Home Management;Cryotherapy;Electrical Stimulation;Iontophoresis 43m/ml Dexamethasone;Traction;Moist Heat;Gait training;Stair training;Functional mobility training;Patient/family education;Therapeutic exercise;Therapeutic activities;Ultrasound;Manual techniques;Passive range of motion;Dry needling;Taping;Vasopneumatic Device   PT Next Visit Plan continue to progress stairs    PT Home Exercise Plan heel slide; glut set, quad set, LAQ    Consulted and Agree with Plan of Care Patient      Patient will benefit from skilled therapeutic intervention in order to improve the following deficits and impairments:  Abnormal gait, Decreased range of motion, Difficulty walking, Pain, Decreased endurance, Decreased knowledge of precautions, Decreased strength, Decreased mobility, Impaired sensation, Postural dysfunction  Visit Diagnosis: Pain in right hip  Stiffness of right hip, not elsewhere classified  Difficulty in walking, not elsewhere classified  Localized edema     Problem List Patient Active Problem List   Diagnosis Date Noted  . Impingement syndrome of right shoulder 07/05/2013  . Empyema lung ([place]   . Abscess of lung(513.0)   . Bipolar depression ([place]   . ADD (attention deficit disorder)   . Hepatitis C   . Lung mass 10/22/2010  . Pleural effusion 10/15/2010    DCarney LivingPT DPT  02/26/2017, 8:31 PM  [facility]GRollins [place] [address]Phone: [phone]  Fax:  [phone] Name: DSCORPIO FORTINMRN: [id number] of Birth: 504/24/1957

## 2017-02-27 MED FILL — TAMSULOSIN HCL 0.4 MG CAP: 0.4 | 90 days supply | Qty: 180 | Fill #0

## 2017-03-12 DIAGNOSIS — Z96641 Presence of right artificial hip joint: Secondary | ICD-10-CM | POA: Diagnosis not present

## 2017-03-12 DIAGNOSIS — Z96649 Presence of unspecified artificial hip joint: Secondary | ICD-10-CM | POA: Diagnosis not present

## 2017-03-18 ENCOUNTER — Ambulatory Visit: Payer: BLUE CROSS/BLUE SHIELD | Admitting: Physical Therapy

## 2017-03-18 DIAGNOSIS — R600 Localized edema: Secondary | ICD-10-CM

## 2017-03-18 DIAGNOSIS — M25551 Pain in right hip: Secondary | ICD-10-CM

## 2017-03-18 DIAGNOSIS — M25651 Stiffness of right hip, not elsewhere classified: Secondary | ICD-10-CM | POA: Diagnosis not present

## 2017-03-18 DIAGNOSIS — R262 Difficulty in walking, not elsewhere classified: Secondary | ICD-10-CM | POA: Diagnosis not present

## 2017-03-18 NOTE — Therapy (Signed)
[place], [place], [address] Phone: [phone]   Fax:  [phone]  Physical Therapy Treatment  Patient Details  Name: Paul Ray MRN: [id number] Date of Birth: 1955-09-29 Referring Provider: Dr Sallyanne Havers   Encounter Date: 03/18/2017      PT End of Session - 03/18/17 1346    Visit Number 27   Number of Visits 30   Date for PT Re-Evaluation 03/29/17   Authorization Type BCBS  Ded met   PT Start Time 1330   PT Stop Time 1425   PT Time Calculation (min) 55 min   Activity Tolerance Patient tolerated treatment well   Behavior During Therapy [facility] for tasks assessed/performed      Past Medical History:  Diagnosis Date  . Abscess of lung(513.0)    Right lower lobe  . ADD (attention deficit disorder)   . Anxiety   . Arthritis   . Bipolar depression ([place])   . Empyema lung ([place])   . Hemorrhoids   . Hepatitis C   . Pneumonia    last year  . Prostate abscess   . Prostate troubles     Past Surgical History:  Procedure Laterality Date  . ELBOW SURGERY  2005   Right  . FOOT NEUROMA SURGERY  2006   Left  . HIP RESURFACING     left hip at [facility] 01/2011  . LUMBAR LAMINECTOMY/DECOMPRESSION MICRODISCECTOMY  07/24/2011   Procedure: LUMBAR LAMINECTOMY/DECOMPRESSION MICRODISCECTOMY;  Surgeon: Eustace Moore;  Location: [place] [facility];  Service: Neurosurgery;  Laterality: Bilateral;  Bilateral  , Lumbar Three-Four, Lumbar Four-Five Decompressive Laminectomy Rm # 32  . LUNG SURGERY     for empyema  . SHOULDER ARTHROSCOPY Right 07/05/2013   Procedure: RIGHT SHOULDER ARTHROSCOPY WITH DEBRIDEMENT;  Surgeon: Mcarthur Rossetti, MD;  Location: [place];  Service: Orthopedics;  Laterality: Right;  . TENDON TRANSFER Left 01/26/2014   Procedure: LEFT THUMB TRAPEZIECTOMY KNOTTED TENDON INTRAPOSITION TRANSFER OF ABDUCTOR POLLICIS LONGUS TO [place];  Surgeon: Cammie Sickle, MD;  Location: [place];   Service: Orthopedics;  Laterality: Left;  . THORACOTOMY  November 14 2010   rt    There were no vitals filed for this visit.      Subjective Assessment - 03/18/17 1550    Subjective Patient reports he has had pain into his hip. He reports pain that is going into his groin. His pain is worse when he stands and walks. He has not been seen for 3 weeks. He feels like his hip is backtracking    Pertinent History elbow surgery 2005; shoulder scope 2014; lumbar decompression 2014    Limitations Standing;Walking;House hold activities;Lifting   How long can you sit comfortably? No limit    How long can you stand comfortably? < 5 minutes before pain increases    How long can you walk comfortably? 200-300 feet in the community before pain increases    Diagnostic tests Venus doppler 12/22/2016 per ED notes (-)    Currently in Pain? Yes   Pain Score 3    Pain Location Hip   Pain Orientation Right   Pain Descriptors / Indicators Aching   Pain Type Surgical pain   Pain Radiating Towards into the groin    Pain Onset More than a month ago   Pain Frequency Constant   Aggravating Factors  standing and walking    Pain Relieving Factors rest    Effect of Pain on Daily  Activities dififuclty perfroming ADL's                          [facility] Adult PT Treatment/Exercise - 03/18/17 0001      Knee/Hip Exercises: Machines for Strengthening   Cybex Leg Press sled 7 3x10 80 lb 2x10 80 lb      Knee/Hip Exercises: Standing   Heel Raises Limitations 2x15 on hip machine    Functional Squat Limitations 2x10 with cuing; continues to have popping when he goes too deep.    Other Standing Knee Exercises step onto air-ex 2x10 forward and 2x10 lateral; standing hip abduction 3x10;    Other Standing Knee Exercises 3 way hip 2x10 ea ch leg      Knee/Hip Exercises: Supine   Bridges Limitations 2x15   Other Supine Knee/Hip Exercises supine march 3x10    Other Supine Knee/Hip Exercises clam shell blue 2x10      Cryotherapy   Number Minutes Cryotherapy 15 Minutes   Cryotherapy Location Hip   Type of Cryotherapy Ice pack     Manual Therapy   Manual therapy comments Gentle Passive ROM into flexion ER and IR. Careful not to push into pain or end ranges; IT band roll out                 PT Education - 03/18/17 1603    Education provided Yes   Education Details continue with HEP    Person(s) Educated Patient   Methods Explanation;Demonstration;Tactile cues;Verbal cues   Comprehension Verbalized understanding;Returned demonstration;Tactile cues required;Verbal cues required          PT Short Term Goals - 02/24/17 0953      PT SHORT TERM GOAL #1   Title Patient will increase gross right lower extremity strangth to 4+/5    Baseline progressing    Time 4   Period Weeks   Status On-going     PT SHORT TERM GOAL #2   Title Patient will increase passive right hip flexion by 10 degrees    Time 4   Period Weeks   Status On-going     PT SHORT TERM GOAL #3   Title Patient will be independent with initial HEP    Time 4   Period Weeks   Status On-going     PT SHORT TERM GOAL #4   Title Patient will report 2/10 pain at worst in right hip    Time 4   Period Weeks   Status On-going           PT Long Term Goals - 02/13/17 1045      PT LONG TERM GOAL #1   Title Patient will ambualte 3000' with no device and miniaml pain and antalgic gait in order to ambualte in the community   Baseline antalgi gait until he wamrs up    Time 4   Period Weeks   Status On-going     PT LONG TERM GOAL #2   Title Patient will stand for 20 minutes without increased pain in order to perfrom ADL's    Time 8   Period Weeks   Status On-going     PT LONG TERM GOAL #3   Title Patient will increase active hip flexion to 90 degrees in order to walk and sit comfortably    Time 8   Period Weeks   Status On-going               Plan -  03/18/17 1603    Clinical Impression Statement  Patient tolerated treatment well. He reported decreased pain with treatment. He was encouraged to continue stretching and strengthening over the next few days. He is very concerened with the pain in his anterior hip when he leans forward.    History and Personal Factors relevant to plan of care: fluctuating pain and stiffness    Clinical Presentation due to: pain and stiffness    Clinical Decision Making Moderate   PT Frequency 2x / week   PT Duration 8 weeks   PT Treatment/Interventions ADLs/Self Care Home Management;Cryotherapy;Electrical Stimulation;Iontophoresis 75m/ml Dexamethasone;Traction;Moist Heat;Gait training;Stair training;Functional mobility training;Patient/family education;Therapeutic exercise;Therapeutic activities;Ultrasound;Manual techniques;Passive range of motion;Dry needling;Taping;Vasopneumatic Device   PT Next Visit Plan continue to progress stairs    PT Home Exercise Plan heel slide; glut set, quad set, LAQ    Consulted and Agree with Plan of Care Patient      Patient will benefit from skilled therapeutic intervention in order to improve the following deficits and impairments:  Abnormal gait, Decreased range of motion, Difficulty walking, Pain, Decreased endurance, Decreased knowledge of precautions, Decreased strength, Decreased mobility, Impaired sensation, Postural dysfunction  Visit Diagnosis: Pain in right hip  Stiffness of right hip, not elsewhere classified  Difficulty in walking, not elsewhere classified  Localized edema     Problem List Patient Active Problem List   Diagnosis Date Noted  . Impingement syndrome of right shoulder 07/05/2013  . Empyema lung ([place]   . Abscess of lung(513.0)   . Bipolar depression ([place]   . ADD (attention deficit disorder)   . Hepatitis C   . Lung mass 10/22/2010  . Pleural effusion 10/15/2010    DCarney LivingPT DPT  03/18/2017, 4:10 PM  [facility]GNew [place] [address]Phone: [phone]  Fax:  [phone] Name: Paul TAVISMRN: [id number] of Birth: 519-Dec-1957

## 2017-03-19 ENCOUNTER — Ambulatory Visit: Payer: BLUE CROSS/BLUE SHIELD | Admitting: Physical Therapy

## 2017-03-20 ENCOUNTER — Ambulatory Visit: Payer: BLUE CROSS/BLUE SHIELD | Admitting: Physical Therapy

## 2017-03-20 DIAGNOSIS — R1031 Right lower quadrant pain: Secondary | ICD-10-CM | POA: Diagnosis not present

## 2017-03-26 DIAGNOSIS — M545 Low back pain: Secondary | ICD-10-CM | POA: Diagnosis not present

## 2017-03-26 DIAGNOSIS — M25551 Pain in right hip: Secondary | ICD-10-CM | POA: Diagnosis not present

## 2017-03-26 DIAGNOSIS — G894 Chronic pain syndrome: Secondary | ICD-10-CM | POA: Diagnosis not present

## 2017-03-26 DIAGNOSIS — Z79891 Long term (current) use of opiate analgesic: Secondary | ICD-10-CM | POA: Diagnosis not present

## 2017-03-26 MED FILL — HYDROmorphone HCL 4 MG TABS: 4 | 30 days supply | Qty: 180 | Fill #0

## 2017-03-26 MED FILL — MORPHINE SULF 60 MG TAB SA: 60 | 30 days supply | Qty: 60 | Fill #0

## 2017-04-02 ENCOUNTER — Ambulatory Visit: Payer: BLUE CROSS/BLUE SHIELD | Attending: Family Medicine | Admitting: Physical Therapy

## 2017-04-02 ENCOUNTER — Encounter: Payer: Self-pay | Admitting: Physical Therapy

## 2017-04-02 DIAGNOSIS — M25551 Pain in right hip: Secondary | ICD-10-CM | POA: Insufficient documentation

## 2017-04-02 DIAGNOSIS — R262 Difficulty in walking, not elsewhere classified: Secondary | ICD-10-CM | POA: Insufficient documentation

## 2017-04-02 DIAGNOSIS — R600 Localized edema: Secondary | ICD-10-CM | POA: Insufficient documentation

## 2017-04-02 DIAGNOSIS — M25651 Stiffness of right hip, not elsewhere classified: Secondary | ICD-10-CM | POA: Insufficient documentation

## 2017-04-02 NOTE — Therapy (Signed)
[place], [place], [address] Phone: [phone]   Fax:  [phone]  Physical Therapy Treatment  Patient Details  Name: Paul Ray MRN: [id number] Date of Birth: [birth year] Referring Provider: Dr Sallyanne Havers   Encounter Date: 04/02/2017      PT End of Session - 04/02/17 2112    Visit Number 28   Number of Visits 30   Date for PT Re-Evaluation 03/29/17   Authorization Type BCBS  Ded met   PT Start Time 1100   PT Stop Time 1155   PT Time Calculation (min) 55 min   Activity Tolerance Patient tolerated treatment well   Behavior During Therapy [facility] for tasks assessed/performed      Past Medical History:  Diagnosis Date  . Abscess of lung(513.0)    Right lower lobe  . ADD (attention deficit disorder)   . Anxiety   . Arthritis   . Bipolar depression ([place])   . Empyema lung ([place])   . Hemorrhoids   . Hepatitis C   . Pneumonia    last year  . Prostate abscess   . Prostate troubles     Past Surgical History:  Procedure Laterality Date  . ELBOW SURGERY  2005   Right  . FOOT NEUROMA SURGERY  2006   Left  . HIP RESURFACING     left hip at [facility] 01/2011  . LUMBAR LAMINECTOMY/DECOMPRESSION MICRODISCECTOMY  07/24/2011   Procedure: LUMBAR LAMINECTOMY/DECOMPRESSION MICRODISCECTOMY;  Surgeon: Eustace Moore;  Location: [place] [facility];  Service: Neurosurgery;  Laterality: Bilateral;  Bilateral  , Lumbar Three-Four, Lumbar Four-Five Decompressive Laminectomy Rm # 32  . LUNG SURGERY     for empyema  . SHOULDER ARTHROSCOPY Right 07/05/2013   Procedure: RIGHT SHOULDER ARTHROSCOPY WITH DEBRIDEMENT;  Surgeon: Mcarthur Rossetti, MD;  Location: [place];  Service: Orthopedics;  Laterality: Right;  . TENDON TRANSFER Left 01/26/2014   Procedure: LEFT THUMB TRAPEZIECTOMY KNOTTED TENDON INTRAPOSITION TRANSFER OF ABDUCTOR POLLICIS LONGUS TO [place];  Surgeon: Cammie Sickle, MD;  Location: [place];   Service: Orthopedics;  Laterality: Left;  . THORACOTOMY  November 14 2010   rt    There were no vitals filed for this visit.      Subjective Assessment - 04/02/17 1129    Subjective Patient reports over the past 2 weeks he has had an increase in groin pain. He continues to have pain when he is bending to pick objects off the floor. He has been to the MD who did an ultrasound and found no hernia.    Pertinent History elbow surgery 2005; shoulder scope 2014; lumbar decompression 2014    Limitations Standing;Walking;House hold activities;Lifting   How long can you sit comfortably? No limit    How long can you stand comfortably? < 5 minutes before pain increases    How long can you walk comfortably? 200-300 feet in the community before pain increases    Diagnostic tests [place] doppler 12/22/2016 per ED notes (-)    Patient Stated Goals to have less pain with walking    Currently in Pain? Yes   Pain Score 3    Pain Location Hip   Pain Orientation Right   Pain Descriptors / Indicators Aching   Pain Type Surgical pain   Pain Onset More than a month ago   Pain Frequency Constant   Aggravating Factors  standing andwalking    Pain Relieving Factors rest    Effect  of Pain on Daily Activities deifficulty perfroming ADL's                                  PT Education - 04/02/17 2111    Education provided Yes   Education Details continue with HEP;    Person(s) Educated Patient   Methods Explanation;Demonstration;Tactile cues;Verbal cues   Comprehension Returned demonstration;Verbalized understanding;Verbal cues required;Tactile cues required;Need further instruction          PT Short Term Goals - 02/24/17 0953      PT SHORT TERM GOAL #1   Title Patient will increase gross right lower extremity strangth to 4+/5    Baseline progressing    Time 4   Period Weeks   Status On-going     PT SHORT TERM GOAL #2   Title Patient will increase passive right hip flexion  by 10 degrees    Time 4   Period Weeks   Status On-going     PT SHORT TERM GOAL #3   Title Patient will be independent with initial HEP    Time 4   Period Weeks   Status On-going     PT SHORT TERM GOAL #4   Title Patient will report 2/10 pain at worst in right hip    Time 4   Period Weeks   Status On-going           PT Long Term Goals - 02/13/17 1045      PT LONG TERM GOAL #1   Title Patient will ambualte 3000' with no device and miniaml pain and antalgic gait in order to ambualte in the community   Baseline antalgi gait until he wamrs up    Time 4   Period Weeks   Status On-going     PT LONG TERM GOAL #2   Title Patient will stand for 20 minutes without increased pain in order to perfrom ADL's    Time 8   Period Weeks   Status On-going     PT LONG TERM GOAL #3   Title Patient will increase active hip flexion to 90 degrees in order to walk and sit comfortably    Time 8   Period Weeks   Status On-going               Plan - 04/02/17 2113    Clinical Impression Statement Patient continues to have pain. He only has 2 visits left per insurance. He would like to use his visits. He has had less pain when he performs PT. Therapy will review gym activity with patient over the next few visits.    History and Personal Factors relevant to plan of care: fluctuating pain and stiffness    Clinical Presentation Stable   Clinical Decision Making Moderate   Rehab Potential Good   PT Frequency 2x / week   PT Duration 8 weeks   PT Treatment/Interventions ADLs/Self Care Home Management;Cryotherapy;Electrical Stimulation;Iontophoresis 43m/ml Dexamethasone;Traction;Moist Heat;Gait training;Stair training;Functional mobility training;Patient/family education;Therapeutic exercise;Therapeutic activities;Ultrasound;Manual techniques;Passive range of motion;Dry needling;Taping;Vasopneumatic Device   PT Next Visit Plan continue to progress stairs    PT Home Exercise Plan heel slide;  glut set, quad set, LAQ    Consulted and Agree with Plan of Care Patient      Patient will benefit from skilled therapeutic intervention in order to improve the following deficits and impairments:  Abnormal gait, Decreased range of motion, Difficulty walking, Pain, Decreased endurance,  Decreased knowledge of precautions, Decreased strength, Decreased mobility, Impaired sensation, Postural dysfunction  Visit Diagnosis: Pain in right hip - Plan: PT plan of care cert/re-cert  Stiffness of right hip, not elsewhere classified - Plan: PT plan of care cert/re-cert  Difficulty in walking, not elsewhere classified - Plan: PT plan of care cert/re-cert  Localized edema - Plan: PT plan of care cert/re-cert     Problem List Patient Active Problem List   Diagnosis Date Noted  . Impingement syndrome of right shoulder 07/05/2013  . Empyema lung ([place])   . Abscess of lung(513.0)   . Bipolar depression ([place])   . ADD (attention deficit disorder)   . Hepatitis C   . Lung mass 10/22/2010  . Pleural effusion 10/15/2010    Carney Living  PT DPT  04/02/2017, 9:19 PM  [facility] [address] [place], [place], [address] Phone: [phone]   Fax:  [phone]  Name: Paul Ray MRN: [id number] Date of Birth: 08-04-1955

## 2017-04-06 DIAGNOSIS — L570 Actinic keratosis: Secondary | ICD-10-CM | POA: Diagnosis not present

## 2017-04-06 DIAGNOSIS — L814 Other melanin hyperpigmentation: Secondary | ICD-10-CM | POA: Diagnosis not present

## 2017-04-06 DIAGNOSIS — D225 Melanocytic nevi of trunk: Secondary | ICD-10-CM | POA: Diagnosis not present

## 2017-04-06 DIAGNOSIS — L821 Other seborrheic keratosis: Secondary | ICD-10-CM | POA: Diagnosis not present

## 2017-04-09 DIAGNOSIS — R1031 Right lower quadrant pain: Secondary | ICD-10-CM | POA: Diagnosis not present

## 2017-04-10 MED FILL — lamoTRIgine 200 MG TABS: 200 | 90 days supply | Qty: 90 | Fill #0

## 2017-04-15 DIAGNOSIS — K625 Hemorrhage of anus and rectum: Secondary | ICD-10-CM | POA: Diagnosis not present

## 2017-04-16 ENCOUNTER — Ambulatory Visit (INDEPENDENT_AMBULATORY_CARE_PROVIDER_SITE_OTHER): Payer: BLUE CROSS/BLUE SHIELD

## 2017-04-16 ENCOUNTER — Ambulatory Visit (INDEPENDENT_AMBULATORY_CARE_PROVIDER_SITE_OTHER): Payer: BLUE CROSS/BLUE SHIELD | Admitting: Orthopaedic Surgery

## 2017-04-16 DIAGNOSIS — Z96641 Presence of right artificial hip joint: Secondary | ICD-10-CM

## 2017-04-16 MED FILL — GAVILYTE-N SOLUTION: 420 | 1 days supply | Qty: 4000 | Fill #0

## 2017-04-16 NOTE — Progress Notes (Signed)
The patient is someone who is 4 months out from a right [place] hip resurfacing arthroplasty. This was [place] elsewhere and [place] of the [place]. He had this on his left side also but 6 years ago. He is 61 years old now. He feels like this is been a longer recovery for him on the right side. He is a very active individual. He is on chronic narcotic pain medicine as well. It hurts mainly with activities. He has some pain in his testicle in the groin area and is been seen for hernia injection seeing a urologist soon to rule out other sources of pain other than the hip replacement itself.  On exam I can easily rotate his right hip through internal/external rotation with minimal discomfort. His incision is well-healed with no infection. His leg lengths are equal.  X-rays of his pelvis and hip show well-seated implant bilaterally and I see no complicating features at all or evidence of loosening or hardware failure.  For orthopedic standpoint I do not see anything in terms of problematic issues with hip replacement itself. I gave him reassurance that at his age and high physical demands that he does that hip pain of 4 months is still appropriate half. A been described how the procedure is done to give a better understanding of how painful this can be. I do think that his pain medications do through his pain centers in disarray and can be heightened for a while post surgery such as this. I gave him reassurance of the replacement itself looks good. He'll follow-up as needed.

## 2017-04-20 ENCOUNTER — Ambulatory Visit: Payer: BLUE CROSS/BLUE SHIELD | Admitting: Physical Therapy

## 2017-04-20 DIAGNOSIS — R262 Difficulty in walking, not elsewhere classified: Secondary | ICD-10-CM

## 2017-04-20 DIAGNOSIS — M25551 Pain in right hip: Secondary | ICD-10-CM | POA: Diagnosis not present

## 2017-04-20 DIAGNOSIS — M25651 Stiffness of right hip, not elsewhere classified: Secondary | ICD-10-CM | POA: Diagnosis not present

## 2017-04-20 DIAGNOSIS — R600 Localized edema: Secondary | ICD-10-CM | POA: Diagnosis not present

## 2017-04-20 NOTE — Therapy (Signed)
[place], [place], [address] Phone: [phone]   Fax:  [phone]  Physical Therapy Treatment  Patient Details  Name: Paul Ray MRN: [id number] Date of Birth: [birth year] Referring Provider: Dr Sallyanne Havers   Encounter Date: 04/20/2017      PT End of Session - 04/20/17 0900    Visit Number 29   Number of Visits 30   Date for PT Re-Evaluation 05/11/17   Authorization Type BCBS  Ded met   PT Start Time [phone]   PT Stop Time 0930   PT Time Calculation (min) 44 min   Activity Tolerance Patient tolerated treatment well   Behavior During Therapy [facility] for tasks assessed/performed      Past Medical History:  Diagnosis Date  . Abscess of lung(513.0)    Right lower lobe  . ADD (attention deficit disorder)   . Anxiety   . Arthritis   . Bipolar depression ([place])   . Empyema lung ([place])   . Hemorrhoids   . Hepatitis C   . Pneumonia    last year  . Prostate abscess   . Prostate troubles     Past Surgical History:  Procedure Laterality Date  . ELBOW SURGERY  2005   Right  . FOOT NEUROMA SURGERY  2006   Left  . HIP RESURFACING     left hip at [facility] 01/2011  . LUMBAR LAMINECTOMY/DECOMPRESSION MICRODISCECTOMY  07/24/2011   Procedure: LUMBAR LAMINECTOMY/DECOMPRESSION MICRODISCECTOMY;  Surgeon: Eustace Moore;  Location: [place] [facility];  Service: Neurosurgery;  Laterality: Bilateral;  Bilateral  , Lumbar Three-Four, Lumbar Four-Five Decompressive Laminectomy Rm # 32  . LUNG SURGERY     for empyema  . SHOULDER ARTHROSCOPY Right 07/05/2013   Procedure: RIGHT SHOULDER ARTHROSCOPY WITH DEBRIDEMENT;  Surgeon: Mcarthur Rossetti, MD;  Location: [place];  Service: Orthopedics;  Laterality: Right;  . TENDON TRANSFER Left 01/26/2014   Procedure: LEFT THUMB TRAPEZIECTOMY KNOTTED TENDON INTRAPOSITION TRANSFER OF ABDUCTOR POLLICIS LONGUS TO [place];  Surgeon: Cammie Sickle, MD;  Location: [place];   Service: Orthopedics;  Laterality: Left;  . THORACOTOMY  November 14 2010   rt    There were no vitals filed for this visit.                       [place] Adult PT Treatment/Exercise - 04/20/17 0001      Knee/Hip Exercises: Machines for Strengthening   Cybex Leg Press sled 7 3x10 80 lb 2x10 80 lb      Knee/Hip Exercises: Standing   Heel Raises Limitations 2x15 on hip machine    Forward Step Up Limitations 8 inch 2x10    Functional Squat Limitations 2x10 with cuing; No popping when the patient kep a shoulder width base.    SLS 3x15 sec hold 2x with tennis ba toss up x10    Other Standing Knee Exercises step onto air-ex 2x10 forward and 2x10 lateral; standing hip abduction 3x10;    Other Standing Knee Exercises 3 way hip 2x10 ea ch leg      Knee/Hip Exercises: Supine   Bridges Limitations 2x15   Other Supine Knee/Hip Exercises supine march 3x10    Other Supine Knee/Hip Exercises clam shell blue 2x10     Cryotherapy   Number Minutes Cryotherapy 15 Minutes   Cryotherapy Location Hip   Type of Cryotherapy Ice pack     Manual Therapy   Manual therapy comments  Gentle Passive ROM into flexion ER and IR. Careful not to push into pain or end ranges; IT band roll out                 PT Education - 04/20/17 1117    Education provided Yes   Education Details reviewed anatomy of hi replacement    Person(s) Educated Patient   Methods Explanation;Demonstration;Tactile cues;Verbal cues   Comprehension Verbalized understanding;Returned demonstration;Verbal cues required;Tactile cues required;Need further instruction          PT Short Term Goals - 04/20/17 1119      PT SHORT TERM GOAL #1   Title Patient will increase gross right lower extremity strangth to 4+/5    Baseline progressing    Time 4   Period Weeks     PT SHORT TERM GOAL #2   Title Patient will increase passive right hip flexion by 10 degrees    Time 4   Period Weeks   Status On-going     PT  SHORT TERM GOAL #3   Title Patient will be independent with initial HEP    Time 4   Period Weeks   Status On-going     PT SHORT TERM GOAL #4   Title Patient will report 2/10 pain at worst in right hip    Time 4   Period Weeks   Status On-going           PT Long Term Goals - 02/13/17 1045      PT LONG TERM GOAL #1   Title Patient will ambualte 3000' with no device and miniaml pain and antalgic gait in order to ambualte in the community   Baseline antalgi gait until he wamrs up    Time 4   Period Weeks   Status On-going     PT LONG TERM GOAL #2   Title Patient will stand for 20 minutes without increased pain in order to perfrom ADL's    Time 8   Period Weeks   Status On-going     PT LONG TERM GOAL #3   Title Patient will increase active hip flexion to 90 degrees in order to walk and sit comfortably    Time 8   Period Weeks   Status On-going               Plan - 04/20/17 0902    Clinical Impression Statement Patient tolerated treatment well. He continues to have minor pain with treatment. He was encouraged to continue stretching and strengthening at home. He will be seen for 1 more visit then will be discharged to HEP. Patient is concerned about his groin pain but the groin pain has improved. Therapy reviewed the anatomy of the procedure that he had perfromed.    History and Personal Factors relevant to plan of care: fluctuationg pain and stiffness    Clinical Presentation Stable   Clinical Decision Making Moderate   Rehab Potential Good   PT Frequency 2x / week   PT Duration 8 weeks   PT Treatment/Interventions ADLs/Self Care Home Management;Cryotherapy;Electrical Stimulation;Iontophoresis 56m/ml Dexamethasone;Traction;Moist Heat;Gait training;Stair training;Functional mobility training;Patient/family education;Therapeutic exercise;Therapeutic activities;Ultrasound;Manual techniques;Passive range of motion;Dry needling;Taping;Vasopneumatic Device   PT Next Visit  Plan review HEP    Consulted and Agree with Plan of Care Patient      Patient will benefit from skilled therapeutic intervention in order to improve the following deficits and impairments:  Abnormal gait, Decreased range of motion, Difficulty walking, Pain, Decreased endurance, Decreased knowledge of  precautions, Decreased strength, Decreased mobility, Impaired sensation, Postural dysfunction  Visit Diagnosis: Pain in right hip - Plan: PT plan of care cert/re-cert  Stiffness of right hip, not elsewhere classified - Plan: PT plan of care cert/re-cert  Difficulty in walking, not elsewhere classified - Plan: PT plan of care cert/re-cert  Localized edema - Plan: PT plan of care cert/re-cert     Problem List Patient Active Problem List   Diagnosis Date Noted  . Impingement syndrome of right shoulder 07/05/2013  . Empyema lung ([place])   . Abscess of lung(513.0)   . Bipolar depression ([place])   . ADD (attention deficit disorder)   . Hepatitis C   . Lung mass 10/22/2010  . Pleural effusion 10/15/2010    Carney Living PT DPT  04/20/2017, 11:23 AM  [facility] [address] [place], [place], [address] Phone: [phone]   Fax:  [phone]  Name: Paul Ray MRN: [id number] Date of Birth: September 27, 1955

## 2017-04-22 ENCOUNTER — Ambulatory Visit: Payer: BLUE CROSS/BLUE SHIELD | Admitting: Physical Therapy

## 2017-04-22 DIAGNOSIS — K640 First degree hemorrhoids: Secondary | ICD-10-CM | POA: Diagnosis not present

## 2017-04-22 DIAGNOSIS — K921 Melena: Secondary | ICD-10-CM | POA: Diagnosis not present

## 2017-04-24 MED FILL — HYDROmorphone HCL 4 MG TABS: 4 | 30 days supply | Qty: 180 | Fill #0

## 2017-04-24 MED FILL — MORPHINE SULF 60 MG TAB SA: 60 | 30 days supply | Qty: 60 | Fill #0

## 2017-04-29 ENCOUNTER — Ambulatory Visit: Payer: BLUE CROSS/BLUE SHIELD | Admitting: Physical Therapy

## 2017-04-30 DIAGNOSIS — N50811 Right testicular pain: Secondary | ICD-10-CM | POA: Diagnosis not present

## 2017-05-04 ENCOUNTER — Ambulatory Visit (INDEPENDENT_AMBULATORY_CARE_PROVIDER_SITE_OTHER): Payer: BLUE CROSS/BLUE SHIELD | Admitting: Orthopaedic Surgery

## 2017-05-04 ENCOUNTER — Ambulatory Visit (INDEPENDENT_AMBULATORY_CARE_PROVIDER_SITE_OTHER): Payer: BLUE CROSS/BLUE SHIELD

## 2017-05-04 DIAGNOSIS — M7541 Impingement syndrome of right shoulder: Secondary | ICD-10-CM | POA: Diagnosis not present

## 2017-05-04 DIAGNOSIS — M25511 Pain in right shoulder: Secondary | ICD-10-CM

## 2017-05-04 DIAGNOSIS — G8929 Other chronic pain: Secondary | ICD-10-CM | POA: Diagnosis not present

## 2017-05-04 MED FILL — DEXTROAMP-AMPHET ER 30 MG C: 30 | 90 days supply | Qty: 180 | Fill #0

## 2017-05-04 MED FILL — DEXTROAMP-AMPHETAMIN 20 MG: 20 | 90 days supply | Qty: 90 | Fill #0

## 2017-05-04 NOTE — Progress Notes (Signed)
Office Visit Note   Patient: Paul Ray           Date of Birth: 02-Sep-1955           MRN: [id number] Visit Date: 05/04/2017              Requested by: Shirline Frees, MD [place], [place] [address] PCP: Shirline Frees, MD   Assessment & Plan: Visit Diagnoses:  1. Chronic right shoulder pain   2. Impingement syndrome of right shoulder     Plan: I think for this gentleman that topical anti-inflammatories will work the best. He is deferring any steroid injection as he says they have never helped any in the past. He can try anti-inflammatories and activity modification with rest. We have talked about the possibility of physical therapy in the future. We'll reevaluate him in 3 weeks.  Follow-Up Instructions: Return in about 3 weeks (around 05/25/2017).   Orders:  Orders Placed This Encounter  Procedures  . XR Shoulder Right   No orders of the defined types were placed in this encounter.     Procedures: No procedures performed   Clinical Data: No additional findings.   Subjective: No chief complaint on file. The patient is a 61 year old gentleman well known to me. He strained his right shoulder last week work in the yard and is been severe sharp pain since then. It hurts mainly reaching behind him with overhead activities. This is a shoulder that we did perform a arthroscopic surgery on 2014 for severe impingement syndrome. He says it is felt good for long period time but is hurting now for this reason injury working in the yard. 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 He is right-hand-dominant.  HPI  Review of Systems He currently denies any headache, chest pain, shortness of breath, fever, chills, nausea, vomiting.  Objective: Vital Signs: There were no vitals taken for this visit.  Physical Exam He is alert and oriented 3 and in no acute distress Ortho Exam Examination of his right shoulder  shows pain with abduction and reaching behind him. The rotator cuff itself feels like it is intact with abduction and external rotation. Specialty Comments:  No specialty comments available.  Imaging: Xr Shoulder Right  Result Date: 05/04/2017 3 views of the right shoulder show well located shoulder. There is obvious space at the acromioclavicular joint from previous distal clavicle resection and arthroscopic intervention. Is no significant glenohumeral arthritic changes. The subacromial outlet shows good space.    PMFS History: Patient Active Problem List   Diagnosis Date Noted  . Chronic right shoulder pain 05/04/2017  . Impingement syndrome of right shoulder 07/05/2013  . Empyema lung ([place])   . Abscess of lung(513.0)   . Bipolar depression ([place])   . ADD (attention deficit disorder)   . Hepatitis C   . Lung mass 10/22/2010  . Pleural effusion 10/15/2010   Past Medical History:  Diagnosis Date  . Abscess of lung(513.0)    Right lower lobe  . ADD (attention deficit disorder)   . Anxiety   . Arthritis   . Bipolar depression ([place])   . Empyema lung ([place])   . Hemorrhoids   . Hepatitis C   . Pneumonia    last year  . Prostate abscess   . Prostate troubles     Family History  Problem Relation Age of Onset  . COPD Mother   . Heart disease  Mother   . Hypertension Mother   . Coronary artery disease Father   . Dementia Father   . Heart failure Father   . Prostate cancer Paternal Grandfather        also had bone cancer  . Breast cancer Maternal Grandmother   . Cancer Paternal Grandmother        unsure what kind    Past Surgical History:  Procedure Laterality Date  . ELBOW SURGERY  2005   Right  . FOOT NEUROMA SURGERY  2006   Left  . HIP RESURFACING     left hip at [facility] 01/2011  . LUMBAR LAMINECTOMY/DECOMPRESSION MICRODISCECTOMY  07/24/2011   Procedure: LUMBAR LAMINECTOMY/DECOMPRESSION MICRODISCECTOMY;  Surgeon: Eustace Moore;  Location: [place] [facility];  Service:  Neurosurgery;  Laterality: Bilateral;  Bilateral  , Lumbar Three-Four, Lumbar Four-Five Decompressive Laminectomy Rm # 32  . LUNG SURGERY     for empyema  . SHOULDER ARTHROSCOPY Right 07/05/2013   Procedure: RIGHT SHOULDER ARTHROSCOPY WITH DEBRIDEMENT;  Surgeon: Mcarthur Rossetti, MD;  Location: [place];  Service: Orthopedics;  Laterality: Right;  . TENDON TRANSFER Left 01/26/2014   Procedure: LEFT THUMB TRAPEZIECTOMY KNOTTED TENDON INTRAPOSITION TRANSFER OF ABDUCTOR POLLICIS LONGUS TO [place];  Surgeon: Cammie Sickle, MD;  Location: [place];  Service: Orthopedics;  Laterality: Left;  . THORACOTOMY  November 14 2010   rt   Social History   Occupational History  . carpenter    Social History Main Topics  . Smoking status: Never Smoker  . Smokeless tobacco: Never Used  . Alcohol use No     Comment: quit drinking in 1989  . Drug use: No  . Sexual activity: Not on file

## 2017-05-19 ENCOUNTER — Ambulatory Visit: Payer: BLUE CROSS/BLUE SHIELD | Admitting: Physical Therapy

## 2017-05-21 ENCOUNTER — Ambulatory Visit: Payer: BLUE CROSS/BLUE SHIELD | Admitting: Physical Therapy

## 2017-05-21 DIAGNOSIS — G894 Chronic pain syndrome: Secondary | ICD-10-CM | POA: Diagnosis not present

## 2017-05-21 DIAGNOSIS — M545 Low back pain: Secondary | ICD-10-CM | POA: Diagnosis not present

## 2017-05-21 DIAGNOSIS — Z79891 Long term (current) use of opiate analgesic: Secondary | ICD-10-CM | POA: Diagnosis not present

## 2017-05-21 DIAGNOSIS — M25551 Pain in right hip: Secondary | ICD-10-CM | POA: Diagnosis not present

## 2017-05-22 MED FILL — MORPHINE SULF 60 MG TAB SA: 60 | 30 days supply | Qty: 60 | Fill #0

## 2017-05-22 MED FILL — HYDROmorphone HCL 4 MG TABS: 4 | 30 days supply | Qty: 180 | Fill #0

## 2017-05-25 ENCOUNTER — Ambulatory Visit (INDEPENDENT_AMBULATORY_CARE_PROVIDER_SITE_OTHER): Payer: BLUE CROSS/BLUE SHIELD | Admitting: Orthopaedic Surgery

## 2017-05-25 DIAGNOSIS — M7541 Impingement syndrome of right shoulder: Secondary | ICD-10-CM | POA: Diagnosis not present

## 2017-05-25 NOTE — Progress Notes (Signed)
The patient is here for follow-up of his right shoulder.  Since we injected it and gave him topical anti-inflammatories he is doing much better and has no pain today.  He had a right hip resurfacing arthroplasty done almost 6 months ago but [place].  He still having some groin pain.  He is following up with that physician next month.  On exam of his shoulder he has excellent range of motion of his right shoulder.  He actually has excellent range of motion of his right hip as well.  His pain seems to be on the pubis area.  He says he is always been evaluated for hernia was negative.  From my standpoint he can try topical anti-inflammatories in this area of his pubis.  He will otherwise follow-up as needed with me.

## 2017-05-26 ENCOUNTER — Ambulatory Visit: Payer: BLUE CROSS/BLUE SHIELD | Attending: Family Medicine | Admitting: Physical Therapy

## 2017-05-26 DIAGNOSIS — M25551 Pain in right hip: Secondary | ICD-10-CM | POA: Diagnosis not present

## 2017-05-26 DIAGNOSIS — M25651 Stiffness of right hip, not elsewhere classified: Secondary | ICD-10-CM | POA: Diagnosis not present

## 2017-05-26 DIAGNOSIS — R262 Difficulty in walking, not elsewhere classified: Secondary | ICD-10-CM | POA: Insufficient documentation

## 2017-05-26 DIAGNOSIS — R600 Localized edema: Secondary | ICD-10-CM | POA: Diagnosis not present

## 2017-05-26 NOTE — Therapy (Signed)
[place], [place], [address] Phone: [phone]   Fax:  [phone]  Physical Therapy Treatment/ [facility]  Patient Details  Name: Paul Ray MRN: [id number] Date of Birth: [birth year] Referring Provider: Dr Sallyanne Havers   Encounter Date: 05/26/2017      PT End of Session - 05/26/17 1542    Visit Number 30   Number of Visits 30   Date for PT Re-Evaluation 05/11/17   Authorization Type BCBS  Ded met   PT Start Time 0932   PT Stop Time 1030   PT Time Calculation (min) 58 min   Activity Tolerance Patient tolerated treatment well   Behavior During Therapy [facility] for tasks assessed/performed      Past Medical History:  Diagnosis Date  . Abscess of lung(513.0)    Right lower lobe  . ADD (attention deficit disorder)   . Anxiety   . Arthritis   . Bipolar depression ([place])   . Empyema lung ([place])   . Hemorrhoids   . Hepatitis C   . Pneumonia    last year  . Prostate abscess   . Prostate troubles     Past Surgical History:  Procedure Laterality Date  . ELBOW SURGERY  2005   Right  . FOOT NEUROMA SURGERY  2006   Left  . HIP RESURFACING     [facility] hip [facility] 01/2011  . LUMBAR LAMINECTOMY/DECOMPRESSION MICRODISCECTOMY  07/24/2011   Procedure: LUMBAR LAMINECTOMY/DECOMPRESSION MICRODISCECTOMY;  Surgeon: Eustace Moore;  Location: [facility];  Service: Neurosurgery;  Laterality: Bilateral;  Bilateral  , Lumbar Three-Four, Lumbar Four-Five Decompressive Laminectomy Rm # 32  . LUNG SURGERY     for empyema  . SHOULDER ARTHROSCOPY Right 07/05/2013   Procedure: RIGHT SHOULDER ARTHROSCOPY WITH DEBRIDEMENT;  Surgeon: Mcarthur Rossetti, MD;  Location: [place];  Service: Orthopedics;  Laterality: Right;  . TENDON TRANSFER Left 01/26/2014   Procedure: LEFT THUMB TRAPEZIECTOMY KNOTTED TENDON INTRAPOSITION TRANSFER OF ABDUCTOR POLLICIS LONGUS TO [place];  Surgeon: Cammie Sickle, MD;  Location: [place];  Service: Orthopedics;  Laterality: Left;  . THORACOTOMY  November 14 2010   rt    There were no vitals filed for this visit.      Subjective Assessment - 05/26/17 1540    Subjective Patietent has reach max potential fro therapy. Therapy will review HEP and D/C to HEP    Pertinent History elbow surgery 2005; shoulder scope 2014; lumbar decompression 2014    Limitations Standing;Walking;House hold activities;Lifting   How long can you sit comfortably? No limit    How long can you stand comfortably? < 5 minutes before pain increases    How long can you walk comfortably? 200-300 feet in the community before pain increases    Diagnostic tests Venus doppler 12/22/2016 per ED notes (-)    Patient Stated Goals to have less pain with walking    Currently in Pain? Yes   Pain Score 3    Pain Location Hip   Pain Orientation Right   Pain Descriptors / Indicators Aching   Pain Type Surgical pain   Pain Onset More than a month ago   Pain Frequency Constant   Aggravating Factors  standing and walking    Pain Relieving Factors rest   Effect of Pain on Daily Activities difficulty perfroming ADL's  [place] Adult PT Treatment/Exercise - 05/26/17 0001      Knee/Hip Exercises: Machines for Strengthening   Cybex Leg Press sled 7 3x10 80 lb 2x10 80 lb      Knee/Hip Exercises: Standing   Heel Raises Limitations 2x15 on hip machine    Forward Step Up Limitations 8 inch 2x10    Functional Squat Limitations 2x10 with cuing; No popping when the patient kep a shoulder width base.    SLS 3x15 sec hold 2x with tennis ba toss up x10    Other Standing Knee Exercises 3 way hip 2x10 ea ch leg;laterla band walk      Knee/Hip Exercises: Supine   Bridges Limitations 2x15 with band    Straight Leg Raises Limitations 2x10 with band    Other Supine Knee/Hip Exercises thomas stretch 2x30 sec hold    Other Supine Knee/Hip Exercises clam shell blue 2x10      Modalities   Modalities Moist Heat     Cryotherapy   Number Minutes Cryotherapy 15 Minutes   Cryotherapy Location Hip   Type of Cryotherapy Ice pack                PT Education - 05/26/17 1541    Education provided Yes   Education Details reviewed technique with HEP    Person(s) Educated Patient   Methods Explanation;Demonstration;Verbal cues;Tactile cues   Comprehension Verbalized understanding;Returned demonstration;Verbal cues required;Tactile cues required;Need further instruction          PT Short Term Goals - 05/26/17 1543      PT SHORT TERM GOAL #1   Title Patient will increase gross right lower extremity strangth to 4+/5    Baseline 4+/5 right hip flexion    Time 4   Period Weeks   Status Achieved     PT SHORT TERM GOAL #2   Title Patient will increase passive right hip flexion by 10 degrees    Time 4   Period Weeks   Status Achieved     PT SHORT TERM GOAL #3   Title Patient will be independent with initial HEP    Time 4   Period Weeks   Status Achieved     PT SHORT TERM GOAL #4   Title Patient will report 2/10 pain at worst in right hip    Baseline 2/10 pain at worst    Time 4   Period Weeks   Status Achieved           PT Long Term Goals - 05/26/17 1544      PT LONG TERM GOAL #1   Title Patient will ambualte 3000' with no device and miniaml pain and antalgic gait in order to ambualte in the community   Baseline antalgi gait until he wamrs up    Time 4   Period Weeks   Status Achieved     PT LONG TERM GOAL #2   Title Patient will stand for 20 minutes without increased pain in order to perfrom ADL's    Time 8   Period Weeks   Status Achieved     PT LONG TERM GOAL #3   Title Patient will increase active hip flexion to 90 degrees in order to walk and sit comfortably    Time 8   Period Weeks   Status Achieved               Plan - 05/26/17 1542    Clinical Impression Statement Patient continues to have intermittent pain.  he was given a complete HEP. He will continue with exercises at home. D/C to HEP.    History and Personal Factors relevant to plan of care: fluctuating pain and stiffness    Clinical Presentation Stable   Clinical Presentation due to: pain and stiffness    Clinical Decision Making Moderate   Rehab Potential Good   PT Frequency 2x / week   PT Duration 8 weeks   PT Treatment/Interventions ADLs/Self Care Home Management;Cryotherapy;Electrical Stimulation;Iontophoresis 37m/ml Dexamethasone;Traction;Moist Heat;Gait training;Stair training;Functional mobility training;Patient/family education;Therapeutic exercise;Therapeutic activities;Ultrasound;Manual techniques;Passive range of motion;Dry needling;Taping;Vasopneumatic Device   PT Next Visit Plan D/C to HEP    PT Home Exercise Plan heel slide; glut set, quad set, LAQ    Consulted and Agree with Plan of Care Patient      Patient will benefit from skilled therapeutic intervention in order to improve the following deficits and impairments:  Abnormal gait, Decreased range of motion, Difficulty walking, Pain, Decreased endurance, Decreased knowledge of precautions, Decreased strength, Decreased mobility, Impaired sensation, Postural dysfunction  Visit Diagnosis: Pain in right hip  Stiffness of right hip, not elsewhere classified  Difficulty in walking, not elsewhere classified  Localized edema  PHYSICAL THERAPY DISCHARGE SUMMARY  Visits from Start of Care: 30  Current functional level related to goals / functional outcomes: Performing all functional activity but with pain     Remaining deficits:  continues to have pain     Education / Equipment: HEP  Plan: Patient agrees to discharge.  Patient goals were not met. Patient is being discharged due to lack of progress.  ?????       Problem List Patient Active Problem List   Diagnosis Date Noted  . Chronic right shoulder pain 05/04/2017  . Impingement syndrome of right shoulder  07/05/2013  . Empyema lung ([place]   . Abscess of lung(513.0)   . Bipolar depression ([place]   . ADD (attention deficit disorder)   . Hepatitis C   . Lung mass 10/22/2010  . Pleural effusion 10/15/2010    DCarney LivingPT DPT  05/26/2017, 3:45 PM  [facility]Al[address] [place] [address]Phone: [phone]  Fax:  [phone] Name: Paul MANDIGOMRN: [id number] of Birth: [birth year]

## 2017-06-25 ENCOUNTER — Telehealth (INDEPENDENT_AMBULATORY_CARE_PROVIDER_SITE_OTHER): Payer: Self-pay | Admitting: Orthopaedic Surgery

## 2017-06-25 ENCOUNTER — Ambulatory Visit (INDEPENDENT_AMBULATORY_CARE_PROVIDER_SITE_OTHER): Payer: BLUE CROSS/BLUE SHIELD | Admitting: Orthopaedic Surgery

## 2017-06-25 MED FILL — TAMSULOSIN HCL 0.4 MG CAP: 0.4 | 90 days supply | Qty: 180 | Fill #1

## 2017-06-25 MED FILL — DIVALPROEX SOD ER 500 MG TA: 500 | 90 days supply | Qty: 270 | Fill #0

## 2017-06-25 NOTE — Telephone Encounter (Signed)
° ° ° ° ° °  Pt wanted to know if he could come in tomorrow for an x-ray.Pt stated he is in a lot of pain from fail.He wanted me to sched him with another provide so he could be seen tomorrow and I made him aware that we cannot change without doctors orders.

## 2017-06-26 NOTE — Telephone Encounter (Signed)
LMOM for patient letting him know we can do xray on the day is schedule to see Artis Delay

## 2017-06-29 ENCOUNTER — Other Ambulatory Visit: Payer: Self-pay | Admitting: Urology

## 2017-06-29 ENCOUNTER — Ambulatory Visit (INDEPENDENT_AMBULATORY_CARE_PROVIDER_SITE_OTHER): Payer: BLUE CROSS/BLUE SHIELD | Admitting: Physician Assistant

## 2017-06-29 DIAGNOSIS — N509 Disorder of male genital organs, unspecified: Secondary | ICD-10-CM

## 2017-06-29 DIAGNOSIS — K4190 Unilateral femoral hernia, without obstruction or gangrene, not specified as recurrent: Secondary | ICD-10-CM | POA: Diagnosis not present

## 2017-07-01 ENCOUNTER — Encounter (INDEPENDENT_AMBULATORY_CARE_PROVIDER_SITE_OTHER): Payer: Self-pay | Admitting: Orthopaedic Surgery

## 2017-07-01 ENCOUNTER — Ambulatory Visit (INDEPENDENT_AMBULATORY_CARE_PROVIDER_SITE_OTHER): Payer: BLUE CROSS/BLUE SHIELD | Admitting: Orthopaedic Surgery

## 2017-07-01 ENCOUNTER — Ambulatory Visit (INDEPENDENT_AMBULATORY_CARE_PROVIDER_SITE_OTHER): Payer: Self-pay

## 2017-07-01 DIAGNOSIS — R0781 Pleurodynia: Secondary | ICD-10-CM | POA: Diagnosis not present

## 2017-07-01 DIAGNOSIS — S2232XA Fracture of one rib, left side, initial encounter for closed fracture: Secondary | ICD-10-CM | POA: Diagnosis not present
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
## 2017-07-06 ENCOUNTER — Ambulatory Visit (INDEPENDENT_AMBULATORY_CARE_PROVIDER_SITE_OTHER): Payer: BLUE CROSS/BLUE SHIELD | Admitting: Orthopaedic Surgery

## 2017-07-10 MED FILL — lamoTRIgine 200 MG TABS: 200 | 90 days supply | Qty: 90 | Fill #1

## 2017-07-13 DIAGNOSIS — F6381 Intermittent explosive disorder: Secondary | ICD-10-CM | POA: Diagnosis not present

## 2017-07-13 DIAGNOSIS — F900 Attention-deficit hyperactivity disorder, predominantly inattentive type: Secondary | ICD-10-CM | POA: Diagnosis not present

## 2017-07-13 DIAGNOSIS — F3131 Bipolar disorder, current episode depressed, mild: Secondary | ICD-10-CM | POA: Diagnosis not present

## 2017-07-14 ENCOUNTER — Ambulatory Visit (HOSPITAL_COMMUNITY)
Admission: RE | Admit: 2017-07-14 | Discharge: 2017-07-14 | Disposition: A | Discharge status: Home / Self Care | Payer: BLUE CROSS/BLUE SHIELD | Source: Ambulatory Visit | Attending: Urology | Admitting: Urology

## 2017-07-14 ENCOUNTER — Other Ambulatory Visit: Payer: Self-pay | Admitting: Urology

## 2017-07-14 DIAGNOSIS — N509 Disorder of male genital organs, unspecified: Secondary | ICD-10-CM

## 2017-07-14 DIAGNOSIS — M898X8 Other specified disorders of bone, other site: Secondary | ICD-10-CM | POA: Insufficient documentation

## 2017-07-14 DIAGNOSIS — N50811 Right testicular pain: Secondary | ICD-10-CM | POA: Diagnosis not present

## 2017-07-16 DIAGNOSIS — Z79891 Long term (current) use of opiate analgesic: Secondary | ICD-10-CM | POA: Diagnosis not present

## 2017-07-16 DIAGNOSIS — M25551 Pain in right hip: Secondary | ICD-10-CM | POA: Diagnosis not present

## 2017-07-16 DIAGNOSIS — G894 Chronic pain syndrome: Secondary | ICD-10-CM | POA: Diagnosis not present

## 2017-07-16 DIAGNOSIS — M545 Low back pain: Secondary | ICD-10-CM | POA: Diagnosis not present

## 2017-07-24 DIAGNOSIS — Z96649 Presence of unspecified artificial hip joint: Secondary | ICD-10-CM | POA: Diagnosis not present

## 2017-07-24 DIAGNOSIS — M25551 Pain in right hip: Secondary | ICD-10-CM | POA: Diagnosis not present

## 2017-08-17 DIAGNOSIS — K521 Toxic gastroenteritis and colitis: Secondary | ICD-10-CM | POA: Diagnosis not present

## 2017-08-17 DIAGNOSIS — T887XXA Unspecified adverse effect of drug or medicament, initial encounter: Secondary | ICD-10-CM | POA: Diagnosis not present

## 2017-08-18 ENCOUNTER — Encounter (HOSPITAL_COMMUNITY): Payer: Self-pay

## 2017-08-18 ENCOUNTER — Emergency Department (HOSPITAL_COMMUNITY)
Admission: EM | Admit: 2017-08-18 | Discharge: 2017-08-18 | Disposition: A | Discharge status: Home / Self Care | Payer: BLUE CROSS/BLUE SHIELD | Attending: Emergency Medicine | Admitting: Emergency Medicine

## 2017-08-18 ENCOUNTER — Emergency Department (HOSPITAL_COMMUNITY): Payer: BLUE CROSS/BLUE SHIELD

## 2017-08-18 DIAGNOSIS — Z79899 Other long term (current) drug therapy: Secondary | ICD-10-CM | POA: Insufficient documentation

## 2017-08-18 DIAGNOSIS — R110 Nausea: Secondary | ICD-10-CM | POA: Insufficient documentation

## 2017-08-18 DIAGNOSIS — R1084 Generalized abdominal pain: Secondary | ICD-10-CM

## 2017-08-18 DIAGNOSIS — K529 Noninfective gastroenteritis and colitis, unspecified: Secondary | ICD-10-CM

## 2017-08-18 DIAGNOSIS — R197 Diarrhea, unspecified: Secondary | ICD-10-CM | POA: Insufficient documentation

## 2017-08-18 DIAGNOSIS — R6883 Chills (without fever): Secondary | ICD-10-CM | POA: Diagnosis not present

## 2017-08-18 DIAGNOSIS — R10811 Right upper quadrant abdominal tenderness: Secondary | ICD-10-CM | POA: Diagnosis not present

## 2017-08-18 DIAGNOSIS — R10813 Right lower quadrant abdominal tenderness: Secondary | ICD-10-CM | POA: Diagnosis not present

## 2017-08-18 DIAGNOSIS — R109 Unspecified abdominal pain: Secondary | ICD-10-CM | POA: Diagnosis not present

## 2017-08-18 LAB — CBC
HCT: 45 % (ref 39.0–52.0)
Hemoglobin: 15.5 g/dL (ref 13.0–17.0)
MCH: 29.2 pg (ref 26.0–34.0)
MCHC: 34.4 g/dL (ref 30.0–36.0)
MCV: 84.7 fL (ref 78.0–100.0)
Platelets: 309 10*3/uL (ref 150–400)
RBC: 5.31 MIL/uL (ref 4.22–5.81)
RDW: 13.2 % (ref 11.5–15.5)
WBC: 8.3 10*3/uL (ref 4.0–10.5)

## 2017-08-18 LAB — URINALYSIS, ROUTINE W REFLEX MICROSCOPIC
Bilirubin Urine: NEGATIVE
Glucose, UA: NEGATIVE mg/dL
Hgb urine dipstick: NEGATIVE
Ketones, ur: NEGATIVE mg/dL
Leukocytes, UA: NEGATIVE
Nitrite: NEGATIVE
Protein, ur: NEGATIVE mg/dL
Specific Gravity, Urine: 1.026 (ref 1.005–1.030)
pH: 5 (ref 5.0–8.0)

## 2017-08-18 LAB — COMPREHENSIVE METABOLIC PANEL
ALT: 15 U/L — ABNORMAL LOW (ref 17–63)
AST: 30 U/L (ref 15–41)
Albumin: 4 g/dL (ref 3.5–5.0)
Alkaline Phosphatase: 99 U/L (ref 38–126)
Anion gap: 11 (ref 5–15)
BUN: 12 mg/dL (ref 6–20)
CO2: 26 mmol/L (ref 22–32)
Calcium: 9.4 mg/dL (ref 8.9–10.3)
Chloride: 100 mmol/L — ABNORMAL LOW (ref 101–111)
Creatinine, Ser: 0.59 mg/dL — ABNORMAL LOW (ref 0.61–1.24)
GFR calc Af Amer: >60 mL/min (ref >60)
GFR calc non Af Amer: >60 mL/min (ref >60)
Glucose, Bld: 140 mg/dL — ABNORMAL HIGH (ref 65–99)
Potassium: 3.7 mmol/L (ref 3.5–5.1)
Sodium: 137 mmol/L (ref 135–145)
Total Bilirubin: 0.8 mg/dL (ref 0.3–1.2)
Total Protein: 7.6 g/dL (ref 6.5–8.1)

## 2017-08-18 LAB — LIPASE, BLOOD: Lipase: 24 U/L (ref 11–51)

## 2017-08-18 MED ORDER — ONDANSETRON 4 MG PO TBDP: 4.0000 mg | ORAL_TABLET | Freq: Three times a day (TID) | ORAL | 0 refills | Status: DC | PRN

## 2017-08-18 MED ORDER — CIPROFLOXACIN HCL 500 MG PO TABS: 500.0000 mg | ORAL_TABLET | Freq: Two times a day (BID) | ORAL | 0 refills | Status: AC

## 2017-08-18 MED ORDER — METRONIDAZOLE 500 MG PO TABS: 500.0000 mg | ORAL_TABLET | Freq: Three times a day (TID) | ORAL | 0 refills | Status: AC

## 2017-08-18 MED ORDER — MORPHINE SULFATE (PF) 4 MG/ML IV SOLN
4.0000 mg | Freq: Once | INTRAVENOUS | Status: AC
  Administered 2017-08-18: 4 mg via INTRAVENOUS
  Filled 2017-08-18: qty 1

## 2017-08-18 MED ORDER — CIPROFLOXACIN HCL 500 MG PO TABS
500.0000 mg | ORAL_TABLET | Freq: Once | ORAL | Status: AC
  Administered 2017-08-18: 500 mg via ORAL
  Filled 2017-08-18: qty 1

## 2017-08-18 MED ORDER — SODIUM CHLORIDE 0.9 % IV BOLUS (SEPSIS)
1000.0000 mL | Freq: Once | INTRAVENOUS | Status: AC
  Administered 2017-08-18: 1000 mL via INTRAVENOUS

## 2017-08-18 MED ORDER — HYDROMORPHONE HCL 1 MG/ML IJ SOLN
0.5000 mg | Freq: Once | INTRAMUSCULAR | Status: AC
  Administered 2017-08-18: 0.5 mg via INTRAVENOUS
  Filled 2017-08-18: qty 1

## 2017-08-18 MED ORDER — ONDANSETRON HCL 4 MG/2ML IJ SOLN
4.0000 mg | Freq: Once | INTRAMUSCULAR | Status: AC
Start: 2017-08-18 — End: 2017-08-18
  Administered 2017-08-18: 4 mg via INTRAVENOUS
  Filled 2017-08-18: qty 2

## 2017-08-18 MED ORDER — DICYCLOMINE HCL 20 MG PO TABS: 20.0000 mg | ORAL_TABLET | Freq: Four times a day (QID) | ORAL | 0 refills | Status: DC | PRN

## 2017-08-18 MED ORDER — IOPAMIDOL (ISOVUE-300) INJECTION 61%
100.0000 mL | Freq: Once | INTRAVENOUS | Status: AC | PRN
  Administered 2017-08-18: 100 mL via INTRAVENOUS

## 2017-08-18 MED ORDER — METRONIDAZOLE 500 MG PO TABS
500.0000 mg | ORAL_TABLET | Freq: Once | ORAL | Status: AC
  Administered 2017-08-18: 500 mg via ORAL
  Filled 2017-08-18: qty 1

## 2017-08-18 NOTE — Discharge Instructions (Addendum)
Use zofran as prescribed, as needed for nausea. Alternate between tylenol and motrin as needed for pain, or use your home pain medications but don't drive while taking narcotics. May consider using over the counter tums, maalox, pepto bismol, or other over the counter remedies to help with symptoms. Stay well hydrated with small sips of fluids throughout the day. Take antibiotics as directed until completed, starting tomorrow since you were given today's doses here. Use bentyl as directed as needed for pain and diarrhea.  Follow a BRAT (banana-rice-applesauce-toast) diet as described below for the next 24-48 hours. The 'BRAT' diet is suggested, then progress to diet as tolerated as symptoms abate. Call your regular doctor if bloody stools, persistent diarrhea, vomiting, fever or abdominal pain. Follow up with your regular doctor in 1 week for recheck of symptoms. Return to ER for changing or worsening of symptoms.

## 2017-08-18 NOTE — ED Notes (Signed)
Patient verbalizes understanding of discharge instructions. Opportunity for questioning and answers were provided. Armband removed by staff, pt discharged from ED via wheelchiar.

## 2017-08-18 NOTE — ED Triage Notes (Signed)
PT reports diarrhea x 2 weeks. Pt states when he woke up this morning he was experiencing abdominal pain, diarrhea, and nausea. He reports he started new medication about 1 month ago that pcp stated could cause diarrhea. Reports imodium has not worked

## 2017-08-18 NOTE — ED Notes (Signed)
Pt provided with hat to provide stool sample, will send occult blood if pt able to have BM per PA

## 2017-08-18 NOTE — ED Notes (Signed)
Pt given urine cup for specimen collection, pt knows the need for urine specimen.

## 2017-08-18 NOTE — ED Provider Notes (Signed)
[facility] EMERGENCY DEPARTMENT Provider Note   CSN: [id number] Arrival date & time: 08/18/17  1424     History   Chief Complaint Chief Complaint  Patient presents with  . Abdominal Pain    HPI Paul Ray is a 62 y.o. male with a PMHx of COPD, ADD, HepC, prostate abscess, bipolar disorder, hemorrhoids, prior RLL abscess, and other conditions listed below, who presents to the ED with complaints of diarrhea x 2 weeks as well as chills, nausea, and abdominal pain that began today.  Patient states that he has been having diarrhea for 2 weeks, including having 10-12 episodes today, and after wiping on the last 2 episodes he noticed an oranges yellowish mucousy material on the paper.  He reports that his diarrhea has been watery but without blood, and occasionally has had mucus in it.  Today he woke up and had chills, nausea, and 8/10 intermittent aching nonradiating generalized abdominal pain with no known aggravating factors and unrelieved with imodium.  He reports that he was seen by his PCP Dr. Inda Castle at [facility] physicians yesterday who felt that it was possible that his new medication (?Symbalyx) could be causing his diarrhea-- he states he started this medication about 1 month ago.  No testing was performed at his PCP visit since he had no abdominal pain yesterday during that visit.    He denies fevers, CP, SOB, vomiting, constipation, obstipation, melena, hematochezia, hematuria, dysuria, testicular pain/swelling, penile discharge, myalgias, arthralgias, numbness, tingling, focal weakness, or any other complaints at this time. Denies recent travel, sick contacts, suspicious food intake, EtOH use, NSAID use, recent abx, or prior abd surgeries.    The history is provided by the patient and medical records. No language interpreter was used.  Abdominal Pain   This is a new problem. The current episode started 6 to 12 hours ago. Episode frequency: intermittently. The problem has  not changed since onset.The pain is associated with an unknown factor. The pain is located in the generalized abdominal region. The quality of the pain is aching. The pain is at a severity of 8/10. The pain is moderate. Associated symptoms include diarrhea and nausea. Pertinent negatives include fever, flatus, hematochezia, melena, vomiting, constipation, dysuria, hematuria, arthralgias and myalgias. Nothing aggravates the symptoms. Nothing relieves the symptoms.  Diarrhea   This is a new problem. The current episode started more than 1 week ago. The problem occurs more than 10 times per day. The problem has not changed since onset.The stool consistency is described as watery and mucous. There has been no fever. Associated symptoms include abdominal pain and chills. Pertinent negatives include no vomiting, no arthralgias and no myalgias. He has tried anti-motility drugs for the symptoms. The treatment provided no relief.    Past Medical History:  Diagnosis Date  . Abscess of lung(513.0)    Right lower lobe  . ADD (attention deficit disorder)   . Anxiety   . Arthritis   . Bipolar depression ([place])   . Empyema lung ([place])   . Hemorrhoids   . Hepatitis C   . Pneumonia    last year  . Prostate abscess   . Prostate troubles     Patient Active Problem List   Diagnosis Date Noted  . Rib pain 07/01/2017  . Closed fracture of one rib of left side 07/01/2017  . Chronic right shoulder pain 05/04/2017  . Impingement syndrome of right shoulder 07/05/2013  . Empyema lung ([place])   . Abscess of lung(513.0)   .  Bipolar depression ([place])   . ADD (attention deficit disorder)   . Hepatitis C   . Lung mass 10/22/2010  . Pleural effusion 10/15/2010    Past Surgical History:  Procedure Laterality Date  . ELBOW SURGERY  2005   Right  . FOOT NEUROMA SURGERY  2006   Left  . HIP RESURFACING     left hip at [facility] 01/2011  . LUMBAR LAMINECTOMY/DECOMPRESSION MICRODISCECTOMY  07/24/2011   Procedure: LUMBAR  LAMINECTOMY/DECOMPRESSION MICRODISCECTOMY;  Surgeon: Eustace Moore;  Location: [facility];  Service: Neurosurgery;  Laterality: Bilateral;  Bilateral  , Lumbar Three-Four, Lumbar Four-Five Decompressive Laminectomy Rm # [phone]  . LUNG SURGERY     for empyema  . SHOULDER ARTHROSCOPY Right 07/05/2013   Procedure: RIGHT SHOULDER ARTHROSCOPY WITH DEBRIDEMENT;  Surgeon: Mcarthur Rossetti, MD;  Location: [facility];  Service: Orthopedics;  Laterality: Right;  . TENDON TRANSFER Left 01/26/2014   Procedure: LEFT THUMB TRAPEZIECTOMY KNOTTED TENDON INTRAPOSITION TRANSFER OF ABDUCTOR POLLICIS LONGUS TO [place];  Surgeon: Cammie Sickle, MD;  Location: [facility];  Service: Orthopedics;  Laterality: Left;  . THORACOTOMY  November 14 2010   rt       Home Medications    Prior to Admission medications   Medication Sig Start Date End Date Taking? Authorizing Provider  alprazolam Duanne Moron) 2 MG tablet Take 2 mg by mouth 2 (two) times daily as needed for anxiety. 10/28/16   [provider]  amphetamine-dextroamphetamine (ADDERALL XR) 30 MG 24 hr capsule Take 60 mg by mouth every morning.    [provider]  amphetamine-dextroamphetamine (ADDERALL) 20 MG tablet Take 20 mg by mouth daily. 08/13/16   [provider]  b complex vitamins tablet Take 1 tablet by mouth daily.    [provider]  bisacodyl (DULCOLAX) 5 MG EC tablet Take 5 mg by mouth daily as needed for constipation.    [provider]  cephALEXin (KEFLEX) 500 MG capsule Take 1 capsule (500 mg total) by mouth 3 (three) times daily. Patient not taking: Reported on 10/08/2016 06/05/14   Wallene Huh, DPM  divalproex (DEPAKOTE) 500 MG EC tablet Take 1,000 mg by mouth every evening.     [provider]  docusate sodium (COLACE) 100 MG capsule Take 100 mg by mouth daily.    [provider]  Glucosamine HCl-MSM (GLUCOSAMINE-MSM PO) Take 1 tablet by mouth every morning. 1500mg  of each  drug    [provider]  HYDROmorphone (DILAUDID) 4 MG tablet Take 4 mg by mouth every 4 (four) hours.  08/14/16   [provider]  JUBLIA 10 % SOLN APPLY TO AFFECTED NAILS DAILY Patient not taking: Reported on 12/22/2016 05/10/14   Harriet Masson, DPM  lamoTRIgine (LAMICTAL) 100 MG tablet Take 100 mg by mouth daily.     [provider]  morphine (MS CONTIN) 30 MG 12 hr tablet Take 60 mg by mouth 2 (two) times daily.  08/14/16   [provider]  Multiple Vitamin (MULTIVITAMIN) capsule Take 1 capsule by mouth daily.      [provider]  naproxen sodium (ANAPROX) 220 MG tablet Take 220 mg by mouth 2 (two) times daily as needed (pain).    [provider]  Tamsulosin HCl (FLOMAX) 0.4 MG CAPS Take 0.8 mg by mouth daily.     [provider]    Family History Family History  Problem Relation Age of Onset  . COPD Mother   . Heart  disease Mother   . Hypertension Mother   . Coronary artery disease Father   . Dementia Father   . Heart failure Father   . Prostate cancer Paternal Grandfather        also had bone cancer  . Breast cancer Maternal Grandmother   . Cancer Paternal Grandmother        unsure what kind    Social History Social History   Tobacco Use  . Smoking status: Never Smoker  . Smokeless tobacco: Never Used  Substance Use Topics  . Alcohol use: No    Comment: quit drinking in 1989  . Drug use: No     Allergies   Patient has no known allergies.   Review of Systems Review of Systems  Constitutional: Positive for chills. Negative for fever.  Respiratory: Negative for shortness of breath.   Cardiovascular: Negative for chest pain.  Gastrointestinal: Positive for abdominal pain, diarrhea and nausea. Negative for blood in stool, constipation, flatus, hematochezia, melena and vomiting.  Genitourinary: Negative for discharge, dysuria, hematuria, scrotal swelling and testicular pain.  Musculoskeletal: Negative  for arthralgias and myalgias.  Skin: Negative for color change.  Allergic/Immunologic: Negative for immunocompromised state.  Neurological: Negative for weakness and numbness.  Psychiatric/Behavioral: Negative for confusion.   All other systems reviewed and are negative for acute change except as noted in the HPI.    Physical Exam Updated Vital Signs BP 138/83 (BP Location: Right Arm)   Pulse 76   Temp 98.4 F (36.9 C) (Oral)   Resp 16   SpO2 97%   Physical Exam  Constitutional: He is oriented to person, place, and time. Vital signs are normal. He appears well-developed and well-nourished.  Non-toxic appearance. No distress.  Afebrile, nontoxic, NAD  HENT:  Head: Normocephalic and atraumatic.  Mouth/Throat: Oropharynx is clear and moist. Mucous membranes are dry.  Dry lips  Eyes: Conjunctivae and EOM are normal. Right eye exhibits no discharge. Left eye exhibits no discharge.  Neck: Normal range of motion. Neck supple.  Cardiovascular: Normal rate, regular rhythm, normal heart sounds and intact distal pulses. Exam reveals no gallop and no friction rub.  No murmur heard. Pulmonary/Chest: Effort normal and breath sounds normal. No respiratory distress. He has no decreased breath sounds. He has no wheezes. He has no rhonchi. He has no rales.  Abdominal: Soft. Normal appearance and bowel sounds are normal. He exhibits no distension. There is tenderness in the right upper quadrant and right lower quadrant. There is no rigidity, no rebound, no guarding, no CVA tenderness, no tenderness at McBurney's point and negative Murphy's sign.  Soft, nondistended, +BS throughout, with mild R sided abdominal TTP, no r/g/r, neg murphy's, neg mcburney's, no CVA TTP   Musculoskeletal: Normal range of motion.  Neurological: He is alert and oriented to person, place, and time. He has normal strength. No sensory deficit.  Skin: Skin is warm, dry and intact. No rash noted.  Psychiatric: He has a normal mood  and affect.  Nursing note and vitals reviewed.    ED Treatments / Results  Labs (all labs ordered are listed, but only abnormal results are displayed) Labs Reviewed  COMPREHENSIVE METABOLIC PANEL - Abnormal; Notable for the following components:      Result Value   Chloride 100 (*)    Glucose, Bld 140 (*)    Creatinine, Ser 0.59 (*)    ALT 15 (*)    All other components within normal limits  URINALYSIS, ROUTINE W REFLEX MICROSCOPIC - Abnormal;  Notable for the following components:   Color, Urine AMBER (*)    APPearance HAZY (*)    All other components within normal limits  LIPASE, BLOOD  CBC    EKG  EKG Interpretation None       Radiology Ct Abdomen Pelvis W Contrast  Result Date: 08/18/2017 CLINICAL DATA:  Diarrhea for 2 weeks, awoke this morning with abdominal pain, diarrhea and nausea EXAM: CT ABDOMEN AND PELVIS WITH CONTRAST TECHNIQUE: Multidetector CT imaging of the abdomen and pelvis was performed using the standard protocol following bolus administration of intravenous contrast. Sagittal and coronal MPR images reconstructed from axial data set. CONTRAST:  122mL ISOVUE-300 IOPAMIDOL (ISOVUE-300) INJECTION 61% IV. No oral contrast. COMPARISON:  10/15/2011 FINDINGS: Lower chest: Dependent atelectasis RIGHT lower lobe. Hepatobiliary: Gallbladder and liver unremarkable Pancreas: Normal appearance Spleen: Normal appearance Adrenals/Urinary Tract: Adrenal glands normal appearance. Small RIGHT renal cyst. Kidneys, and proximal/mid ureters normal appearance. Distal ureters and bladder predominantly obscured by beam hardening artifacts in pelvis, with visualized portions of the anterior bladder grossly normal appearance. Stomach/Bowel: Normal appendix coiled adjacent to cecal tip. Probable wall thickening of the colon at scattered sites with subtle adjacent infiltrative changes of pericolic fat suggesting colitis. Bowel loops are suboptimally evaluated due to lack of opacification and  adequate distention. Small bowel loops grossly unremarkable. Stomach decompressed. Vascular/Lymphatic: Aorta normal caliber. Few normal sized lymph nodes clustered in fat adjacent to cecum/ascending colon. Reproductive: N/A Other: No gross free air or free fluid though portions of pelvis are obscured. No definite hernia. Musculoskeletal: BILATERAL hip prostheses. Degenerative facet disease changes lumbar spine. IMPRESSION: Suspicious areas of scattered colonic wall thickening raising question of colitis as discussed above. No other definite intra-or intrapelvic abnormalities. Electronically Signed   By: Lavonia Dana M.D.   On: 08/18/2017 18:08    Procedures Procedures (including critical care time)  Medications Ordered in ED Medications  sodium chloride 0.9 % bolus 1,000 mL (0 mLs Intravenous Stopped 08/18/17 1830)  ondansetron (ZOFRAN) injection 4 mg (4 mg Intravenous Given 08/18/17 1711)  morphine 4 MG/ML injection 4 mg (4 mg Intravenous Given 08/18/17 1712)  iopamidol (ISOVUE-300) 61 % injection 100 mL (100 mLs Intravenous Contrast Given 08/18/17 1732)  HYDROmorphone (DILAUDID) injection 0.5 mg (0.5 mg Intravenous Given 08/18/17 1815)  ciprofloxacin (CIPRO) tablet 500 mg (500 mg Oral Given 08/18/17 1845)  metroNIDAZOLE (FLAGYL) tablet 500 mg (500 mg Oral Given 08/18/17 1845)     Initial Impression / Assessment and Plan / ED Course  I have reviewed the triage vital signs and the nursing notes.  Pertinent labs & imaging results that were available during my care of the patient were reviewed by me and considered in my medical decision making (see chart for details).     62 y.o. male here with 2wks of diarrhea and abd pain/nausea/chills that began today. On exam, mild R sided abdominal TTP, nonperitoneal, neg murphy's and mcburney's; mildly dry lips; VSS, afebrile and nontoxic. Work up thus far reveals: Lipase WNL, CMP essentially unremarkable, CBC WNL. Awaiting U/A, will get stool sample if he  provides one here, and proceed with CT abd/pelv to evaluate his symptoms. He was recently started on a medication that his doctor told him could cause diarrhea, however he's not completely sure of the name of it. Will attempt to get these records. Will give fluids and pain meds/nausea meds then reassess shortly.   7:09 PM U/A unremarkable. CT abd/pelv with scattered colitis throughout the colon, which would explain his symptoms. Pt  feeling better after morphine 4mg  and dilaudid 0.5mg , and tolerating PO well. Pt unable to provide stool sample while here, which is fine; given duration of diarrhea, I'm suspicious for a bacterial etiology, so will empirically treat for this. Will give first dose of abx here. Given nontoxic appearance, tolerating PO well, and otherwise reassuring work up, will d/c home with zofran, bentyl, and abx for presumed bacterial colitis. Discussed that this would treat for presumed bacterial causes, but if it's viral then it won't change the course of the illness. Advised BRAT diet and adequate hydration. Advised tylenol/motrin for pain, or use of home meds for pain. F/up with PCP in 1wk for recheck of symptoms. I explained the diagnosis and have given explicit precautions to return to the ER including for any other new or worsening symptoms. The patient understands and accepts the medical plan as it's been dictated and I have answered their questions. Discharge instructions concerning home care and prescriptions have been given. The patient is STABLE and is discharged to home in good condition.    Final Clinical Impressions(s) / ED Diagnoses   Final diagnoses:  Diarrhea, unspecified type  Nausea  Generalized abdominal pain  Colitis    ED Discharge Orders        Ordered    ciprofloxacin (CIPRO) 500 MG tablet  2 times daily     08/18/17 1845    metroNIDAZOLE (FLAGYL) 500 MG tablet  3 times daily     08/18/17 1845    dicyclomine (BENTYL) 20 MG tablet  Every 6 hours PRN      08/18/17 1845    ondansetron (ZOFRAN ODT) 4 MG disintegrating tablet  Every 8 hours PRN     08/18/17 [address] Berlyn Saylor, [place], Hershal Coria 08/18/17 1909    Milton Ferguson, MD 08/18/17 2336

## 2017-08-18 NOTE — ED Notes (Signed)
Pt unable to provide stool sample at this time

## 2017-08-25 DIAGNOSIS — K591 Functional diarrhea: Secondary | ICD-10-CM | POA: Diagnosis not present

## 2017-09-01 DIAGNOSIS — R197 Diarrhea, unspecified: Secondary | ICD-10-CM | POA: Diagnosis not present

## 2017-09-01 DIAGNOSIS — R933 Abnormal findings on diagnostic imaging of other parts of digestive tract: Secondary | ICD-10-CM | POA: Diagnosis not present

## 2017-09-02 DIAGNOSIS — R197 Diarrhea, unspecified: Secondary | ICD-10-CM | POA: Diagnosis not present

## 2017-09-02 LAB — HM COLONOSCOPY

## 2017-09-08 DIAGNOSIS — R197 Diarrhea, unspecified: Secondary | ICD-10-CM | POA: Diagnosis not present

## 2017-09-14 DIAGNOSIS — Z79891 Long term (current) use of opiate analgesic: Secondary | ICD-10-CM | POA: Diagnosis not present

## 2017-09-14 DIAGNOSIS — M545 Low back pain: Secondary | ICD-10-CM | POA: Diagnosis not present

## 2017-09-14 DIAGNOSIS — M25551 Pain in right hip: Secondary | ICD-10-CM | POA: Diagnosis not present

## 2017-09-14 DIAGNOSIS — G894 Chronic pain syndrome: Secondary | ICD-10-CM | POA: Diagnosis not present

## 2017-10-23 DIAGNOSIS — R197 Diarrhea, unspecified: Secondary | ICD-10-CM | POA: Diagnosis not present

## 2017-10-27 MED FILL — TAMSULOSIN HCL 0.4 MG CAP: 0.4 | 90 days supply | Qty: 180 | Fill #0

## 2017-11-09 DIAGNOSIS — M545 Low back pain: Secondary | ICD-10-CM | POA: Diagnosis not present

## 2017-11-09 DIAGNOSIS — M25551 Pain in right hip: Secondary | ICD-10-CM | POA: Diagnosis not present

## 2017-11-09 DIAGNOSIS — Z79891 Long term (current) use of opiate analgesic: Secondary | ICD-10-CM | POA: Diagnosis not present

## 2017-11-09 DIAGNOSIS — G894 Chronic pain syndrome: Secondary | ICD-10-CM | POA: Diagnosis not present

## 2017-12-02 ENCOUNTER — Ambulatory Visit
Admission: RE | Admit: 2017-12-02 | Discharge: 2017-12-02 | Disposition: A | Discharge status: Home / Self Care | Payer: BLUE CROSS/BLUE SHIELD | Source: Ambulatory Visit | Attending: Gastroenterology | Admitting: Gastroenterology

## 2017-12-02 ENCOUNTER — Other Ambulatory Visit: Payer: Self-pay | Admitting: Gastroenterology

## 2017-12-02 DIAGNOSIS — R197 Diarrhea, unspecified: Secondary | ICD-10-CM

## 2017-12-02 DIAGNOSIS — R978 Other abnormal tumor markers: Secondary | ICD-10-CM | POA: Diagnosis not present

## 2017-12-07 DIAGNOSIS — R197 Diarrhea, unspecified: Secondary | ICD-10-CM | POA: Diagnosis not present

## 2017-12-07 DIAGNOSIS — W273XXA Contact with needle (sewing), initial encounter: Secondary | ICD-10-CM | POA: Diagnosis not present

## 2017-12-11 DIAGNOSIS — N401 Enlarged prostate with lower urinary tract symptoms: Secondary | ICD-10-CM | POA: Diagnosis not present

## 2017-12-11 DIAGNOSIS — M545 Low back pain: Secondary | ICD-10-CM | POA: Diagnosis not present

## 2017-12-11 DIAGNOSIS — Z125 Encounter for screening for malignant neoplasm of prostate: Secondary | ICD-10-CM | POA: Diagnosis not present

## 2017-12-11 DIAGNOSIS — K591 Functional diarrhea: Secondary | ICD-10-CM | POA: Diagnosis not present

## 2017-12-11 DIAGNOSIS — Z0000 Encounter for general adult medical examination without abnormal findings: Secondary | ICD-10-CM | POA: Diagnosis not present

## 2017-12-30 DIAGNOSIS — K591 Functional diarrhea: Secondary | ICD-10-CM | POA: Diagnosis not present

## 2018-01-04 DIAGNOSIS — F3181 Bipolar II disorder: Secondary | ICD-10-CM | POA: Diagnosis not present

## 2018-01-04 DIAGNOSIS — F900 Attention-deficit hyperactivity disorder, predominantly inattentive type: Secondary | ICD-10-CM | POA: Diagnosis not present

## 2018-01-05 DIAGNOSIS — M545 Low back pain: Secondary | ICD-10-CM | POA: Diagnosis not present

## 2018-01-05 DIAGNOSIS — M25551 Pain in right hip: Secondary | ICD-10-CM | POA: Diagnosis not present

## 2018-01-05 DIAGNOSIS — Z79891 Long term (current) use of opiate analgesic: Secondary | ICD-10-CM | POA: Diagnosis not present

## 2018-01-05 DIAGNOSIS — G894 Chronic pain syndrome: Secondary | ICD-10-CM | POA: Diagnosis not present

## 2018-01-21 DIAGNOSIS — R7301 Impaired fasting glucose: Secondary | ICD-10-CM | POA: Diagnosis not present

## 2018-02-05 MED FILL — TAMSULOSIN HCL 0.4 MG CAP: 0.4 | 90 days supply | Qty: 180 | Fill #0

## 2018-02-11 ENCOUNTER — Encounter (HOSPITAL_COMMUNITY): Payer: Self-pay | Admitting: Emergency Medicine

## 2018-02-11 ENCOUNTER — Ambulatory Visit (HOSPITAL_COMMUNITY)
Admission: EM | Admit: 2018-02-11 | Discharge: 2018-02-11 | Disposition: A | Discharge status: Home / Self Care | Payer: BLUE CROSS/BLUE SHIELD | Attending: Family Medicine | Admitting: Family Medicine

## 2018-02-11 ENCOUNTER — Ambulatory Visit (INDEPENDENT_AMBULATORY_CARE_PROVIDER_SITE_OTHER): Payer: BLUE CROSS/BLUE SHIELD

## 2018-02-11 DIAGNOSIS — Z23 Encounter for immunization: Secondary | ICD-10-CM

## 2018-02-11 DIAGNOSIS — S91332A Puncture wound without foreign body, left foot, initial encounter: Secondary | ICD-10-CM | POA: Diagnosis not present

## 2018-02-11 DIAGNOSIS — M79672 Pain in left foot: Secondary | ICD-10-CM

## 2018-02-11 DIAGNOSIS — W268XXA Contact with other sharp object(s), not elsewhere classified, initial encounter: Secondary | ICD-10-CM | POA: Diagnosis not present

## 2018-02-11 MED ORDER — KETOROLAC TROMETHAMINE 60 MG/2ML IM SOLN
60.0000 mg | Freq: Once | INTRAMUSCULAR | Status: AC
  Administered 2018-02-11: 60 mg via INTRAMUSCULAR

## 2018-02-11 MED ORDER — TETANUS-DIPHTH-ACELL PERTUSSIS 5-2.5-18.5 LF-MCG/0.5 IM SUSP
0.5000 mL | Freq: Once | INTRAMUSCULAR | Status: AC
  Administered 2018-02-11: 0.5 mL via INTRAMUSCULAR

## 2018-02-11 MED ORDER — KETOROLAC TROMETHAMINE 60 MG/2ML IM SOLN
INTRAMUSCULAR | Status: AC
  Filled 2018-02-11: qty 2

## 2018-02-11 MED ORDER — TETANUS-DIPHTH-ACELL PERTUSSIS 5-2.5-18.5 LF-MCG/0.5 IM SUSP
INTRAMUSCULAR | Status: AC
  Filled 2018-02-11: qty 0.5

## 2018-02-11 NOTE — ED Triage Notes (Signed)
Pt sts left foot pain after stepping on metal that went into foot through his shoe today; pt last TD was 2012

## 2018-02-11 NOTE — Discharge Instructions (Addendum)
You were given an updated Tetanus booster today along with Toradol 60mg  for pain. Recommend continue to soak foot 1-2 times a day for comfort. Start Naproxen (Aleve) at home- take 2 tablets every 12 hours as needed for pain. Keep covered while area is still open. If any redness, swelling, discharge or increased pain occurs, follow-up for recheck.

## 2018-02-11 NOTE — ED Provider Notes (Signed)
[place]    CSN: [id number] Arrival date & time: 02/11/18  1043     History   Chief Complaint Chief Complaint  Patient presents with  . Foot Pain    HPI Paul Ray is a 62 y.o. male.   62 year old male presents with injury to his left foot 2 hours ago. He was working with metal pieces at home when he stepped on a 4 to 5 inch piece of metal that went through the bottom of his athletic shoe and punctured the bottom of his foot. The metal piece was rusty and he is concerned about depth of injury. Bleeding is controlled. He is now experiencing significant throbbing pain. He is requesting immediate pain medication. Last Tetanus (Td) was in 2012. Other chronic health issues include bipolar disorder, ADD, anxiety, arthritis, lung disease and chronic pain. Currently on Lamictal, Depakote, Adderall, Xanax, Bentyl, Colace, Dilaudid and Flomax. He is also on MS Contin and took his dose this morning (about 5 hours ago). He has Naproxen for arthritis but has not taken any in a few weeks. Does have full range of motion of foot and toes and denies any numbness.   The history is provided by the patient.    Past Medical History:  Diagnosis Date  . Abscess of lung(513.0)    Right lower lobe  . ADD (attention deficit disorder)   . Anxiety   . Arthritis   . Bipolar depression ([place])   . Empyema lung ([place])   . Hemorrhoids   . Hepatitis C   . Pneumonia    last year  . Prostate abscess   . Prostate troubles     Patient Active Problem List   Diagnosis Date Noted  . Rib pain 07/01/2017  . Closed fracture of one rib of left side 07/01/2017  . Chronic right shoulder pain 05/04/2017  . Impingement syndrome of right shoulder 07/05/2013  . Empyema lung ([place])   . Abscess of lung(513.0)   . Bipolar depression ([place])   . ADD (attention deficit disorder)   . Hepatitis C   . Lung mass 10/22/2010  . Pleural effusion 10/15/2010    Past Surgical History:  Procedure Laterality Date  .  ELBOW SURGERY  2005   Right  . FOOT NEUROMA SURGERY  2006   Left  . HIP RESURFACING     left hip at [facility] 01/2011  . LUMBAR LAMINECTOMY/DECOMPRESSION MICRODISCECTOMY  07/24/2011   Procedure: LUMBAR LAMINECTOMY/DECOMPRESSION MICRODISCECTOMY;  Surgeon: Eustace Moore;  Location: [place] [facility];  Service: Neurosurgery;  Laterality: Bilateral;  Bilateral  , Lumbar Three-Four, Lumbar Four-Five Decompressive Laminectomy Rm # 32  . LUNG SURGERY     for empyema  . SHOULDER ARTHROSCOPY Right 07/05/2013   Procedure: RIGHT SHOULDER ARTHROSCOPY WITH DEBRIDEMENT;  Surgeon: Mcarthur Rossetti, MD;  Location: [facility];  Service: Orthopedics;  Laterality: Right;  . TENDON TRANSFER Left 01/26/2014   Procedure: LEFT THUMB TRAPEZIECTOMY KNOTTED TENDON INTRAPOSITION TRANSFER OF ABDUCTOR POLLICIS LONGUS TO [place];  Surgeon: Cammie Sickle, MD;  Location: [place];  Service: Orthopedics;  Laterality: Left;  . THORACOTOMY  November 14 2010   rt       Home Medications    Prior to Admission medications   Medication Sig Start Date End Date Taking? Authorizing Provider  alprazolam Duanne Moron) 2 MG tablet Take 2 mg by mouth 2 (two) times daily as needed for anxiety. 10/28/16   [provider]  amphetamine-dextroamphetamine (ADDERALL XR)  30 MG 24 hr capsule Take 60 mg by mouth every morning.    [provider]  amphetamine-dextroamphetamine (ADDERALL) 20 MG tablet Take 20 mg by mouth daily. 08/13/16   [provider]  b complex vitamins tablet Take 1 tablet by mouth daily.    [provider]  bisacodyl (DULCOLAX) 5 MG EC tablet Take 5 mg by mouth daily as needed for constipation.    [provider]  dicyclomine (BENTYL) 20 MG tablet Take 1 tablet (20 mg total) by mouth every 6 (six) hours as needed for spasms (abdominal pain and/or diarrhea). 08/18/17   Street, [place], PA-C  divalproex (DEPAKOTE) 500 MG EC tablet Take 1,000 mg by mouth every evening.      [provider]  docusate sodium (COLACE) 100 MG capsule Take 100 mg by mouth daily.    [provider]  Glucosamine HCl-MSM (GLUCOSAMINE-MSM PO) Take 1 tablet by mouth every morning. 1500mg  of each drug    [provider]  HYDROmorphone (DILAUDID) 4 MG tablet Take 4 mg by mouth every 4 (four) hours.  08/14/16   [provider]  lamoTRIgine (LAMICTAL) 100 MG tablet Take 100 mg by mouth daily.     [provider]  morphine (MS CONTIN) 30 MG 12 hr tablet Take 60 mg by mouth 2 (two) times daily.  08/14/16   [provider]  Multiple Vitamin (MULTIVITAMIN) capsule Take 1 capsule by mouth daily.      [provider]  naproxen sodium (ANAPROX) 220 MG tablet Take 220 mg by mouth 2 (two) times daily as needed (pain).    [provider]  ondansetron (ZOFRAN ODT) 4 MG disintegrating tablet Take 1 tablet (4 mg total) by mouth every 8 (eight) hours as needed for nausea or vomiting. 08/18/17   Street, [place], PA-C  Tamsulosin HCl (FLOMAX) 0.4 MG CAPS Take 0.8 mg by mouth daily.     [provider]    Family History Family History  Problem Relation Age of Onset  . COPD Mother   . Heart disease Mother   . Hypertension Mother   . Coronary artery disease Father   . Dementia Father   . Heart failure Father   . Prostate cancer Paternal Grandfather        also had bone cancer  . Breast cancer Maternal Grandmother   . Cancer Paternal Grandmother        unsure what kind    Social History Social History   Tobacco Use  . Smoking status: Never Smoker  . Smokeless tobacco: Never Used  Substance Use Topics  . Alcohol use: No    Comment: quit drinking in 1989  . Drug use: No     Allergies   Patient has no known allergies.   Review of Systems Review of Systems  Constitutional: Negative for appetite change and fever.  Eyes: Negative for photophobia and visual disturbance.  Cardiovascular: Negative for chest pain and  palpitations.  Gastrointestinal: Negative for nausea and vomiting.  Musculoskeletal: Positive for arthralgias, gait problem and myalgias. Negative for joint swelling.  Skin: Positive for wound. Negative for color change and rash.  Neurological: Negative for dizziness, tremors, seizures, syncope, weakness, light-headedness, numbness and headaches.  Psychiatric/Behavioral: The patient is nervous/anxious.      Physical Exam Triage Vital Signs ED Triage Vitals [02/11/18 1136]  Enc Vitals Group     BP 132/83     Pulse Rate (!) 58     Resp 16  Temp 97.7 F (36.5 C)     Temp Source Oral     SpO2 100 %     Weight      Height      Head Circumference      Peak Flow      Pain Score      Pain Loc      Pain Edu?      Excl. in [place]?    No data found.  Updated Vital Signs BP 132/83 (BP Location: Right Arm)   Pulse (!) 58   Temp 97.7 F (36.5 C) (Oral)   Resp 16   SpO2 100%   Visual Acuity Right Eye Distance:   Left Eye Distance:   Bilateral Distance:    Right Eye Near:   Left Eye Near:    Bilateral Near:     Physical Exam  Constitutional: He is oriented to person, place, and time. He appears well-developed and well-nourished. He does not appear ill. No distress.  Patient sitting in wheelchair soaking his left foot in Betadine solution and appears in pain.   HENT:  Head: Normocephalic and atraumatic.  Eyes: Conjunctivae and EOM are normal.  Cardiovascular: Normal rate.  Pulmonary/Chest: Effort normal.  Musculoskeletal: Normal range of motion. He exhibits tenderness.       Left foot: There is tenderness. There is normal range of motion, no swelling, normal capillary refill and no crepitus. Lacerations: puncture wound.       Feet:  Small 58mm round puncture wound present on central lower plantar area of left foot. Minimal bleeding. Very tender. No distinct swelling or redness. No foreign objects seen. Appears to be 38mm deep. Good pulses and capillary refill. No neuro  deficits noted.   Neurological: He is alert and oriented to person, place, and time. He has normal strength. No sensory deficit.  Skin: Skin is warm. Capillary refill takes less than 2 seconds. No abrasion, no bruising, no burn and no ecchymosis noted. Laceration: puncture wound. No erythema.  Psychiatric: His speech is normal. Thought content normal. His mood appears anxious. He is agitated.  Vitals reviewed.    UC Treatments / Results  Labs (all labs ordered are listed, but only abnormal results are displayed) Labs Reviewed - No data to display  EKG None  Radiology Dg Foot Complete Left  Result Date: 02/11/2018 CLINICAL DATA:  The patient suffered a puncture wound to the left foot today when he stepped on a piece of wire. Initial encounter. EXAM: LEFT FOOT - COMPLETE 3+ VIEW COMPARISON:  Plain films left foot 04/12/2013. FINDINGS: No radiopaque foreign body or soft tissue gas is identified. The patient has advanced first MTP osteoarthritis and mild to moderate osteoarthritis of the midfoot. There is a remote healed fracture through the mid diaphysis of the great toe. IMPRESSION: Negative for radiopaque foreign body. Advanced first MTP osteoarthritis. Remote healed fracture proximal phalanx of the great toe. Electronically Signed   By: Inge Rise M.D.   On: 02/11/2018 12:38    Procedures Procedures (including critical care time)  Medications Ordered in UC Medications  Tdap (BOOSTRIX) injection 0.5 mL (0.5 mLs Intramuscular Given 02/11/18 1218)  ketorolac (TORADOL) injection 60 mg (60 mg Intramuscular Given 02/11/18 1220)    Initial Impression / Assessment and Plan / UC Course  I have reviewed the triage vital signs and the nursing notes.  Pertinent labs & imaging results that were available during my care of the patient were reviewed by me and considered in my medical  decision making (see chart for details).    Discussed with patient that I can provide him Ibuprofen 800mg   orally now or Toradol 60mg  IM. Discussed that we do no have any narcotic medication here. Patient requested Toradol. Also gave Tdap booster today since over 5 years since previous Td with a dirty wound. Reviewed x-ray findings with patient. No foreign bodies retained. Uncertain of exact depth of wound but reassured patient since he has full range of motion of foot and no neuro findings. Applied bacitracin ointment and covered with gauze and tape. Encouraged to keep foot elevated and may continue to soak foot at home 2-3 times a day. Did not provide prophylactic antibiotics- continue to monitor for any redness, swelling or discharge from area and return for re-evaluation if these occur. Cover with gauze while still open. Otherwise no weight-bearing activities for the next 2 days. May take Naproxen twice a day that he has at home for pain. Continue other current pain medication from PCP. Follow-up here or with his PCP if any signs of infection occur.  Final Clinical Impressions(s) / UC Diagnoses   Final diagnoses:  Puncture wound of plantar aspect of left foot, initial encounter     Discharge Instructions     You were given an updated Tetanus booster today along with Toradol 60mg  for pain. Recommend continue to soak foot 1-2 times a day for comfort. Start Naproxen (Aleve) at home- take 2 tablets every 12 hours as needed for pain. Keep covered while area is still open. If any redness, swelling, discharge or increased pain occurs, follow-up for recheck.     ED Prescriptions    None     Controlled Substance Prescriptions [place] Controlled Substance Registry consulted? Yes, I have consulted the [place] Controlled Substances Registry for this patient. He is on multiple strong narcotic medications as well as anti-anxiety and ADHD meds. I will not prescribe any additional narcotic medications today- he needs to see his PCP for any additional meds.    Katy Apo, NP 02/12/18 [phone]

## 2018-02-11 NOTE — ED Notes (Signed)
Pt foot soaking in water and betadine mixture

## 2018-03-04 DIAGNOSIS — G894 Chronic pain syndrome: Secondary | ICD-10-CM | POA: Diagnosis not present

## 2018-03-04 DIAGNOSIS — M545 Low back pain: Secondary | ICD-10-CM | POA: Diagnosis not present

## 2018-03-04 DIAGNOSIS — M25551 Pain in right hip: Secondary | ICD-10-CM | POA: Diagnosis not present

## 2018-03-04 DIAGNOSIS — Z79891 Long term (current) use of opiate analgesic: Secondary | ICD-10-CM | POA: Diagnosis not present

## 2018-03-09 ENCOUNTER — Encounter (HOSPITAL_COMMUNITY): Payer: Self-pay | Admitting: Physician Assistant

## 2018-03-09 ENCOUNTER — Ambulatory Visit (HOSPITAL_COMMUNITY)
Admission: EM | Admit: 2018-03-09 | Discharge: 2018-03-09 | Disposition: A | Discharge status: Home / Self Care | Payer: BLUE CROSS/BLUE SHIELD | Attending: Family Medicine | Admitting: Family Medicine

## 2018-03-09 DIAGNOSIS — S81811A Laceration without foreign body, right lower leg, initial encounter: Secondary | ICD-10-CM | POA: Diagnosis not present

## 2018-03-09 DIAGNOSIS — W268XXA Contact with other sharp object(s), not elsewhere classified, initial encounter: Secondary | ICD-10-CM | POA: Diagnosis not present

## 2018-03-09 MED ORDER — LIDOCAINE-EPINEPHRINE (PF) 2 %-1:200000 IJ SOLN
INTRAMUSCULAR | Status: AC
  Filled 2018-03-09: qty 20

## 2018-03-09 MED ORDER — KETOROLAC TROMETHAMINE 30 MG/ML IJ SOLN
INTRAMUSCULAR | Status: AC
  Filled 2018-03-09: qty 1

## 2018-03-09 MED ORDER — KETOROLAC TROMETHAMINE 30 MG/ML IJ SOLN
30.0000 mg | Freq: Once | INTRAMUSCULAR | Status: AC
  Administered 2018-03-09: 30 mg via INTRAMUSCULAR

## 2018-03-09 NOTE — ED Provider Notes (Signed)
[facility]    CSN: [id number] Arrival date & time: 03/09/18  1854     History   Chief Complaint Chief Complaint  Patient presents with  . Extremity Laceration    HPI Paul Ray is a 62 y.o. male.   62 year old male comes in for laceration to the right lower extremity from a metal that happened 2 hours prior to arrival. Bleeding controlled. Denies numbness/tingling. He has not done anything to the wound. Tetanus uptodate.      Past Medical History:  Diagnosis Date  . Abscess of lung(513.0)    Right lower lobe  . ADD (attention deficit disorder)   . Anxiety   . Arthritis   . Bipolar depression ([place])   . Empyema lung ([place])   . Hemorrhoids   . Hepatitis C   . Pneumonia    last year  . Prostate abscess   . Prostate troubles     Patient Active Problem List   Diagnosis Date Noted  . Rib pain 07/01/2017  . Closed fracture of one rib of left side 07/01/2017  . Chronic right shoulder pain 05/04/2017  . Impingement syndrome of right shoulder 07/05/2013  . Empyema lung ([place])   . Abscess of lung(513.0)   . Bipolar depression ([place])   . ADD (attention deficit disorder)   . Hepatitis C   . Lung mass 10/22/2010  . Pleural effusion 10/15/2010    Past Surgical History:  Procedure Laterality Date  . ELBOW SURGERY  2005   Right  . FOOT NEUROMA SURGERY  2006   Left  . HIP RESURFACING     [facility] hip [facility] 01/2011  . LUMBAR LAMINECTOMY/DECOMPRESSION MICRODISCECTOMY  07/24/2011   Procedure: LUMBAR LAMINECTOMY/DECOMPRESSION MICRODISCECTOMY;  Surgeon: Eustace Moore;  Location: [facility];  Service: Neurosurgery;  Laterality: Bilateral;  Bilateral  , Lumbar Three-Four, Lumbar Four-Five Decompressive Laminectomy Rm # 32  . LUNG SURGERY     for empyema  . SHOULDER ARTHROSCOPY Right 07/05/2013   Procedure: RIGHT SHOULDER ARTHROSCOPY WITH DEBRIDEMENT;  Surgeon: Mcarthur Rossetti, MD;  Location: [facility];  Service: Orthopedics;  Laterality: Right;  . TENDON  TRANSFER Left 01/26/2014   Procedure: LEFT THUMB TRAPEZIECTOMY KNOTTED TENDON INTRAPOSITION TRANSFER OF ABDUCTOR POLLICIS LONGUS TO [place];  Surgeon: Cammie Sickle, MD;  Location: [facility];  Service: Orthopedics;  Laterality: Left;  . THORACOTOMY  November 14 2010   rt       Home Medications    Prior to Admission medications   Medication Sig Start Date End Date Taking? Authorizing Provider  alprazolam Duanne Moron) 2 MG tablet Take 2 mg by mouth 2 (two) times daily as needed for anxiety. 10/28/16   [provider]  amphetamine-dextroamphetamine (ADDERALL XR) 30 MG 24 hr capsule Take 60 mg by mouth every morning.    [provider]  amphetamine-dextroamphetamine (ADDERALL) 20 MG tablet Take 20 mg by mouth daily. 08/13/16   [provider]  b complex vitamins tablet Take 1 tablet by mouth daily.    [provider]  bisacodyl (DULCOLAX) 5 MG EC tablet Take 5 mg by mouth daily as needed for constipation.    [provider]  dicyclomine (BENTYL) 20 MG tablet Take 1 tablet (20 mg total) by mouth every 6 (six) hours as needed for spasms (abdominal pain and/or diarrhea). 08/18/17   Street, [place], PA-C  divalproex (DEPAKOTE) 500 MG EC tablet Take 1,000 mg by mouth every evening.  [provider]  docusate sodium (COLACE) 100 MG capsule Take 100 mg by mouth daily.    [provider]  Glucosamine HCl-MSM (GLUCOSAMINE-MSM PO) Take 1 tablet by mouth every morning. 1500mg  of each drug    [provider]  HYDROmorphone (DILAUDID) 4 MG tablet Take 4 mg by mouth every 4 (four) hours.  08/14/16   [provider]  lamoTRIgine (LAMICTAL) 100 MG tablet Take 100 mg by mouth daily.     [provider]  morphine (MS CONTIN) 30 MG 12 hr tablet Take 60 mg by mouth 2 (two) times daily.  08/14/16   [provider]  Multiple Vitamin (MULTIVITAMIN) capsule Take 1 capsule by mouth daily.      [provider]  naproxen sodium (ANAPROX) 220 MG tablet Take 220 mg by mouth 2 (two) times daily as needed (pain).    [provider]  ondansetron (ZOFRAN ODT) 4 MG disintegrating tablet Take 1 tablet (4 mg total) by mouth every 8 (eight) hours as needed for nausea or vomiting. 08/18/17   Street, [place], PA-C  Tamsulosin HCl (FLOMAX) 0.4 MG CAPS Take 0.8 mg by mouth daily.     [provider]    Family History Family History  Problem Relation Age of Onset  . COPD Mother   . Heart disease Mother   . Hypertension Mother   . Coronary artery disease Father   . Dementia Father   . Heart failure Father   . Prostate cancer Paternal Grandfather        also had bone cancer  . Breast cancer Maternal Grandmother   . Cancer Paternal Grandmother        unsure what kind    Social History Social History   Tobacco Use  . Smoking status: Never Smoker  . Smokeless tobacco: Never Used  Substance Use Topics  . Alcohol use: No    Comment: quit drinking in 1989  . Drug use: No     Allergies   Patient has no known allergies.   Review of Systems Review of Systems  Reason unable to perform ROS: See HPI as above.     Physical Exam Triage Vital Signs ED Triage Vitals  Enc Vitals Group     BP 03/09/18 1913 (!) 153/76     Pulse Rate 03/09/18 1913 72     Resp 03/09/18 1913 18     Temp 03/09/18 1913 98.2 F (36.8 C)     Temp Source 03/09/18 1913 Oral     SpO2 03/09/18 1913 100 %     Weight --      Height --      Head Circumference --      Peak Flow --      Pain Score 03/09/18 1917 4     Pain Loc --      Pain Edu? --      Excl. in [place]? --    No data found.  Updated Vital Signs BP (!) 153/76 (BP Location: Left Arm)   Pulse 72   Temp 98.2 F (36.8 C) (Oral)   Resp 18   SpO2 100%   Physical Exam  Constitutional: He is oriented to person, place, and time. He appears well-developed and well-nourished. No distress.  HENT:  Head: Normocephalic and atraumatic.    Eyes: Pupils are equal, round, and reactive to light. Conjunctivae are normal.  Neurological: He is alert and oriented to person, place, and time.  Skin: He is not diaphoretic.  3 cm laceration to the right lateral calf. Bleeding controlled. Sensation intact.      UC Treatments / Results  Labs (all labs ordered are listed, but only abnormal results are displayed) Labs Reviewed - No data to display  EKG None  Radiology No results found.  Procedures Procedures (including critical care time)  Medications Ordered in UC Medications  ketorolac (TORADOL) 30 MG/ML injection 30 mg (30 mg Intramuscular Given 03/09/18 1931)    Initial Impression / Assessment and Plan / UC Course  I have reviewed the triage vital signs and the nursing notes.  Pertinent labs & imaging results that were available during my care of the patient were reviewed by me and considered in my medical decision making (see chart for details).    Patient asking for pain medication prior to laceration repair. Discussed with patient will be using lidocaine with epi prior to repair, which will numb area up. Patient asking for pain medication "immediately" prior to laceration repair. Will provide toradol injection. Did discuss with patient toradol will not decrease discomfort with lidocaine injections. Patient expresses understanding and would like to proceed.   When I returned for laceration repair, patient stated he would like a MD to see him instead. Transferred care to Dr Mannie Stabile.  Final Clinical Impressions(s) / UC Diagnoses   Final diagnoses:  Laceration of right lower extremity, initial encounter    ED Prescriptions    None        Ok Edwards, PA-C 03/09/18 1959

## 2018-03-09 NOTE — ED Triage Notes (Signed)
PT cut his right calf on a piece of copper 2 hours ago. PT had a TDAP update a few weeks ago.

## 2018-03-09 NOTE — Discharge Instructions (Addendum)
You may use over the counter ibuprofen or acetaminophen as needed.  ° °

## 2018-03-14 ENCOUNTER — Telehealth (HOSPITAL_COMMUNITY): Payer: Self-pay | Admitting: *Deleted

## 2018-03-14 ENCOUNTER — Ambulatory Visit (HOSPITAL_COMMUNITY)
Admission: EM | Admit: 2018-03-14 | Discharge: 2018-03-14 | Disposition: A | Discharge status: Home / Self Care | Payer: BLUE CROSS/BLUE SHIELD

## 2018-03-14 ENCOUNTER — Encounter (HOSPITAL_COMMUNITY): Payer: Self-pay | Admitting: *Deleted

## 2018-03-14 DIAGNOSIS — L03116 Cellulitis of left lower limb: Secondary | ICD-10-CM | POA: Diagnosis not present

## 2018-03-14 MED ORDER — SULFAMETHOXAZOLE-TRIMETHOPRIM 800-160 MG PO TABS: 1.0000 | ORAL_TABLET | Freq: Two times a day (BID) | ORAL | 0 refills | Status: AC

## 2018-03-14 MED ORDER — CEFTRIAXONE SODIUM 250 MG IJ SOLR
INTRAMUSCULAR | Status: AC
  Filled 2018-03-14: qty 250

## 2018-03-14 MED ORDER — CEFTRIAXONE SODIUM 250 MG IJ SOLR
250.0000 mg | Freq: Once | INTRAMUSCULAR | Status: AC
  Administered 2018-03-14: 250 mg via INTRAMUSCULAR

## 2018-03-14 MED ORDER — LIDOCAINE HCL (PF) 1 % IJ SOLN
INTRAMUSCULAR | Status: AC
  Filled 2018-03-14: qty 2

## 2018-03-14 MED ORDER — KETOROLAC TROMETHAMINE 30 MG/ML IJ SOLN
INTRAMUSCULAR | Status: AC
  Filled 2018-03-14: qty 1

## 2018-03-14 MED ORDER — KETOROLAC TROMETHAMINE 30 MG/ML IJ SOLN
30.0000 mg | Freq: Once | INTRAMUSCULAR | Status: AC
  Administered 2018-03-14: 30 mg via INTRAMUSCULAR

## 2018-03-14 NOTE — Discharge Instructions (Signed)
Return in 2 days for wound check You may call for an appointment Take the Septra twice a day this is an antibiotic Return sooner if you are worse at any time instead of better

## 2018-03-14 NOTE — ED Triage Notes (Addendum)
Pt had sutures placed to right lower leg 8/13.  Pt noticed redness and purulent drainage to wound starting last night.  Sutures intact.  Denies fevers. Pt requesting to see an MD.  Requesting something for pain - "I had Toradol last time, so something like that, if that's the strongest you've got, as soon as possible."

## 2018-03-16 ENCOUNTER — Ambulatory Visit (HOSPITAL_COMMUNITY)
Admission: EM | Admit: 2018-03-16 | Discharge: 2018-03-16 | Disposition: A | Discharge status: Home / Self Care | Payer: BLUE CROSS/BLUE SHIELD | Attending: Family Medicine | Admitting: Family Medicine

## 2018-03-16 ENCOUNTER — Encounter (HOSPITAL_COMMUNITY): Payer: Self-pay | Admitting: Emergency Medicine

## 2018-03-16 DIAGNOSIS — L03115 Cellulitis of right lower limb: Secondary | ICD-10-CM | POA: Diagnosis not present

## 2018-03-16 MED ORDER — AMOXICILLIN-POT CLAVULANATE 875-125 MG PO TABS: 1.0000 | ORAL_TABLET | Freq: Two times a day (BID) | ORAL | 0 refills | Status: DC

## 2018-03-16 NOTE — Discharge Instructions (Signed)
Add Augmentin 1 pill twice a day Take in addition to the sulfa antibiotic twice a day Please take a probiotic daily to prevent stomach upset from all these antibiotics This can be an over-the-counter product like Hardin Negus colon health Elevate your leg several times a day.  It is necessary to try to get the swelling down.  You must decrease your activity for 48 hours. Return if you do not see improvement, or if the redness and swelling get worse at any time instead of better

## 2018-03-16 NOTE — ED Provider Notes (Signed)
[place]    CSN: [id number] Arrival date & time: 03/16/18  1653     History   Chief Complaint Chief Complaint  Patient presents with  . Wound Check    appt 1809    HPI Paul Ray is a 62 y.o. male.   HPI  Here for follow-up of cellulitis and suture removal He is taking the Septra DS p.o. twice a day for infection.  He states he is only had 3 doses to date He is continuing to stay physically active and his leg is still quite swollen, and red.  No fever or myalgias.  Past Medical History:  Diagnosis Date  . Abscess of lung(513.0)    Right lower lobe  . ADD (attention deficit disorder)   . Anxiety   . Arthritis   . Bipolar depression ([place])   . Empyema lung ([place])   . Hemorrhoids   . Hepatitis C   . Pneumonia    last year  . Prostate abscess   . Prostate troubles     Patient Active Problem List   Diagnosis Date Noted  . Rib pain 07/01/2017  . Closed fracture of one rib of left side 07/01/2017  . Chronic right shoulder pain 05/04/2017  . Impingement syndrome of right shoulder 07/05/2013  . Empyema lung ([place])   . Abscess of lung(513.0)   . Bipolar depression ([place])   . ADD (attention deficit disorder)   . Hepatitis C   . Lung mass 10/22/2010  . Pleural effusion 10/15/2010    Past Surgical History:  Procedure Laterality Date  . ELBOW SURGERY  2005   Right  . FOOT NEUROMA SURGERY  2006   Left  . HIP RESURFACING     left hip at [facility] 01/2011  . LUMBAR LAMINECTOMY/DECOMPRESSION MICRODISCECTOMY  07/24/2011   Procedure: LUMBAR LAMINECTOMY/DECOMPRESSION MICRODISCECTOMY;  Surgeon: Eustace Moore;  Location: [place] [facility];  Service: Neurosurgery;  Laterality: Bilateral;  Bilateral  , Lumbar Three-Four, Lumbar Four-Five Decompressive Laminectomy Rm # 32  . LUNG SURGERY     for empyema  . SHOULDER ARTHROSCOPY Right 07/05/2013   Procedure: RIGHT SHOULDER ARTHROSCOPY WITH DEBRIDEMENT;  Surgeon: Mcarthur Rossetti, MD;  Location: [place];  Service:  Orthopedics;  Laterality: Right;  . TENDON TRANSFER Left 01/26/2014   Procedure: LEFT THUMB TRAPEZIECTOMY KNOTTED TENDON INTRAPOSITION TRANSFER OF ABDUCTOR POLLICIS LONGUS TO [place];  Surgeon: Cammie Sickle, MD;  Location: [place];  Service: Orthopedics;  Laterality: Left;  . THORACOTOMY  November 14 2010   rt       Home Medications    Prior to Admission medications   Medication Sig Start Date End Date Taking? Authorizing Provider  alprazolam Duanne Moron) 2 MG tablet Take 2 mg by mouth 2 (two) times daily as needed for anxiety. 10/28/16   [provider]  amoxicillin-clavulanate (AUGMENTIN) 875-125 MG tablet Take 1 tablet by mouth every 12 (twelve) hours. 03/16/18   Raylene Everts, MD  amphetamine-dextroamphetamine (ADDERALL XR) 30 MG 24 hr capsule Take 60 mg by mouth every morning.    [provider]  amphetamine-dextroamphetamine (ADDERALL) 20 MG tablet Take 20 mg by mouth daily. 08/13/16   [provider]  b complex vitamins tablet Take 1 tablet by mouth daily.    [provider]  bisacodyl (DULCOLAX) 5 MG EC tablet Take 5 mg by mouth daily as needed for constipation.    [provider]  docusate sodium (COLACE) 100 MG capsule Take 100  mg by mouth daily.    [provider]  FLUoxetine HCl (PROZAC PO) Take by mouth.    [provider]  Glucosamine HCl-MSM (GLUCOSAMINE-MSM PO) Take 1 tablet by mouth every morning. 1500mg  of each drug    [provider]  HYDROmorphone (DILAUDID) 4 MG tablet Take 4 mg by mouth every 4 (four) hours.  08/14/16   [provider]  lamoTRIgine (LAMICTAL) 100 MG tablet Take 100 mg by mouth daily.     [provider]  morphine (MS CONTIN) 30 MG 12 hr tablet Take 60 mg by mouth 2 (two) times daily.  08/14/16   [provider]  Multiple Vitamin (MULTIVITAMIN) capsule Take 1 capsule by mouth daily.      [provider]  OLANZapine (ZYPREXA PO) Take  by mouth.    [provider]  ondansetron (ZOFRAN ODT) 4 MG disintegrating tablet Take 1 tablet (4 mg total) by mouth every 8 (eight) hours as needed for nausea or vomiting. 08/18/17   Street, [place], PA-C  sulfamethoxazole-trimethoprim (BACTRIM DS,SEPTRA DS) 800-160 MG tablet Take 1 tablet by mouth 2 (two) times daily for 7 days. 03/14/18 03/21/18  Raylene Everts, MD  Tamsulosin HCl (FLOMAX) 0.4 MG CAPS Take 0.8 mg by mouth daily.     [provider]    Family History Family History  Problem Relation Age of Onset  . COPD Mother   . Heart disease Mother   . Hypertension Mother   . Coronary artery disease Father   . Dementia Father   . Heart failure Father   . Prostate cancer Paternal Grandfather        also had bone cancer  . Breast cancer Maternal Grandmother   . Cancer Paternal Grandmother        unsure what kind    Social History Social History   Tobacco Use  . Smoking status: Never Smoker  . Smokeless tobacco: Never Used  Substance Use Topics  . Alcohol use: No    Comment: quit drinking in 1989  . Drug use: No     Allergies   Patient has no known allergies.   Review of Systems Review of Systems  Constitutional: Negative for chills and fever.  HENT: Negative for ear pain and sore throat.   Eyes: Negative for pain and visual disturbance.  Respiratory: Negative for cough and shortness of breath.   Cardiovascular: Negative for chest pain and palpitations.  Gastrointestinal: Negative for abdominal pain and vomiting.  Genitourinary: Negative for dysuria and hematuria.  Musculoskeletal: Negative for arthralgias and back pain.  Skin: Positive for color change and wound. Negative for rash.  Neurological: Negative for seizures and syncope.  All other systems reviewed and are negative.    Physical Exam Triage Vital Signs ED Triage Vitals  Enc Vitals Group     BP 03/16/18 1737 134/67     Pulse Rate 03/16/18 1737 76     Resp 03/16/18 1737 18      Temp 03/16/18 1737 97.7 F (36.5 C)     Temp Source 03/16/18 1737 Oral     SpO2 03/16/18 1737 97 %     Weight --      Height --      Head Circumference --      Peak Flow --      Pain Score 03/16/18 1739 8     Pain Loc --      Pain Edu? --      Excl. in [place]? --  No data found.  Updated Vital Signs BP 134/67 (BP Location: Left Arm)   Pulse 76   Temp 97.7 F (36.5 C) (Oral)   Resp 18   SpO2 97%      Physical Exam  Constitutional: He appears well-developed and well-nourished. No distress.  HENT:  Head: Normocephalic and atraumatic.  Mouth/Throat: Oropharynx is clear and moist.  Eyes: Pupils are equal, round, and reactive to light. Conjunctivae are normal.  Neck: Normal range of motion.  Cardiovascular: Normal rate.  Pulmonary/Chest: Effort normal. No respiratory distress.  Abdominal: Soft. He exhibits no distension.  Musculoskeletal: Normal range of motion. He exhibits no edema.  Neurological: He is alert.  Skin: Skin is warm and dry.  The stitches are intact.  There is some crusting around the wound but no purulent drainage.  The erythema of the leg is similar to when he was seen last time, extending from just below the knee to several inches below the wound.  The entire lower leg and ankle are swollen.  The skin is shiny.   Sutures are removed without incident.  Steri-Strips are placed.  Care of Steri-Strips as reviewed with patient.  UC Treatments / Results  Labs (all labs ordered are listed, but only abnormal results are displayed) Labs Reviewed - No data to display  EKG None  Radiology No results found.  Procedures Procedures (including critical care time)  Medications Ordered in UC Medications - No data to display  Initial Impression / Assessment and Plan / UC Course  I have reviewed the triage vital signs and the nursing notes.  Pertinent labs & imaging results that were available during my care of the patient were reviewed by me and considered in my  medical decision making (see chart for details).     I discussed with the patient that in order for this to heal he must stay off his foot he must elevate it.  It is important that he get the swelling down.  This will impair wound healing and impair his ability to fight the infection in his leg.  I do not know if his cellulitis is unimproved because he is only had 3 doses of sulfa, or because he is neglecting the care, however, I am going to give him Augmentin in additionTo make sure that he has good bacterial coverage.  He is told to return if not improving in 48 hours Final Clinical Impressions(s) / UC Diagnoses   Final diagnoses:  Cellulitis of right lower extremity     Discharge Instructions     Add Augmentin 1 pill twice a day Take in addition to the sulfa antibiotic twice a day Please take a probiotic daily to prevent stomach upset from all these antibiotics This can be an over-the-counter product like Hardin Negus colon health Elevate your leg several times a day.  It is necessary to try to get the swelling down.  You must decrease your activity for 48 hours. Return if you do not see improvement, or if the redness and swelling get worse at any time instead of better   ED Prescriptions    Medication Sig Dispense Auth. Provider   amoxicillin-clavulanate (AUGMENTIN) 875-125 MG tablet Take 1 tablet by mouth every 12 (twelve) hours. 14 tablet Raylene Everts, MD     Controlled Substance Prescriptions [place] Controlled Substance Registry consulted? Not Applicable   Raylene Everts, MD 03/16/18 2136

## 2018-03-16 NOTE — ED Triage Notes (Signed)
Pt here for wound recheck to right lower leg

## 2018-04-09 DIAGNOSIS — Z736 Limitation of activities due to disability: Secondary | ICD-10-CM

## 2018-05-03 DIAGNOSIS — G8929 Other chronic pain: Secondary | ICD-10-CM | POA: Diagnosis not present

## 2018-05-03 DIAGNOSIS — F3175 Bipolar disorder, in partial remission, most recent episode depressed: Secondary | ICD-10-CM | POA: Diagnosis not present

## 2018-05-03 DIAGNOSIS — M545 Low back pain: Secondary | ICD-10-CM | POA: Diagnosis not present

## 2018-05-17 DIAGNOSIS — F900 Attention-deficit hyperactivity disorder, predominantly inattentive type: Secondary | ICD-10-CM | POA: Diagnosis not present

## 2018-05-17 DIAGNOSIS — F3111 Bipolar disorder, current episode manic without psychotic features, mild: Secondary | ICD-10-CM | POA: Diagnosis not present

## 2018-05-17 DIAGNOSIS — F3131 Bipolar disorder, current episode depressed, mild: Secondary | ICD-10-CM | POA: Diagnosis not present

## 2018-06-16 ENCOUNTER — Ambulatory Visit (INDEPENDENT_AMBULATORY_CARE_PROVIDER_SITE_OTHER): Payer: BLUE CROSS/BLUE SHIELD | Admitting: Orthopaedic Surgery

## 2018-06-16 ENCOUNTER — Encounter (INDEPENDENT_AMBULATORY_CARE_PROVIDER_SITE_OTHER): Payer: Self-pay | Admitting: Orthopaedic Surgery

## 2018-06-16 ENCOUNTER — Ambulatory Visit (INDEPENDENT_AMBULATORY_CARE_PROVIDER_SITE_OTHER): Payer: Self-pay

## 2018-06-16 DIAGNOSIS — R0781 Pleurodynia: Secondary | ICD-10-CM | POA: Diagnosis not present

## 2018-06-16 NOTE — Progress Notes (Signed)
Office Visit Note   Patient: Paul Ray           Date of Birth: [birth year]           MRN: [id number] Visit Date: 06/16/2018              Requested by: Shirline Frees, MD [address], [place] [address] PCP: Shirline Frees, MD   Assessment & Plan: Visit Diagnoses:  1. Rib pain on right side     Plan: Most likely he did sustain a contusion to his ribs on the right side.  Even if there is no fracture there is not much we would do for this.  This does though give him peace of mind and reassurance that were not seeing some significant gross deformity.  He is someone who is incredibly active and appears much younger than his stated age.  This gives him at least some guidance on when he can get back to more heavy physical activities.  All questions concerns were answered and addressed.  Follow-up will be as needed.  Follow-Up Instructions: Return if symptoms worsen or fail to improve.   Orders:  Orders Placed This Encounter  Procedures  . XR Ribs Unilateral Right   No orders of the defined types were placed in this encounter.     Procedures: No procedures performed   Clinical Data: No additional findings.   Subjective: Chief Complaint  Patient presents with  . Chest - Pain  Paul Ray comes in today with right-sided chest pain after mechanical fall about 3 days ago hitting the bed frame in his room very hard.  He said some pain on the right side of his ribs just below the breast line to the right since then.  He has had no shortness of breath or difficulty breathing.  It does hurt to cough and deep breathe.  It hurts when he is first laying down and goes to get up.  He is actually had previous thoracotomy years ago on that right side.  HPI  Review of Systems He currently denies any headache.  He does report chest pain but no shortness of breath.  He denies any fever, chills, nausea, vomiting.  Objective: Vital Signs: There were no vitals taken for this  visit.  Physical Exam He is alert and oriented x3 in no acute distress Ortho Exam On examination shows no labored breathing and his chest expands well.  He has some pain to palpation along the lateral aspect of his side on the right chest below the breast area and line in that area.  There is well-healed surgical incisions from his previous thoracotomy.  I could feel no step-off. Specialty Comments:  No specialty comments available.  Imaging: Xr Ribs Unilateral Right  Result Date: 06/16/2018 Several views of the chest showing the ribs and several planes show no acute fracture.  Lung markings are seen    PMFS History: Patient Active Problem List   Diagnosis Date Noted  . Rib pain 07/01/2017  . Closed fracture of one rib of left side 07/01/2017  . Chronic right shoulder pain 05/04/2017  . Impingement syndrome of right shoulder 07/05/2013  . Empyema lung ([place])   . Abscess of lung(513.0)   . Bipolar depression ([place])   . ADD (attention deficit disorder)   . Hepatitis C   . Lung mass 10/22/2010  . Pleural effusion 10/15/2010   Past Medical History:  Diagnosis Date  . Abscess of lung(513.0)  Right lower lobe  . ADD (attention deficit disorder)   . Anxiety   . Arthritis   . Bipolar depression ([place])   . Empyema lung ([place])   . Hemorrhoids   . Hepatitis C   . Pneumonia    last year  . Prostate abscess   . Prostate troubles     Family History  Problem Relation Age of Onset  . COPD Mother   . Heart disease Mother   . Hypertension Mother   . Coronary artery disease Father   . Dementia Father   . Heart failure Father   . Prostate cancer Paternal Grandfather        also had bone cancer  . Breast cancer Maternal Grandmother   . Cancer Paternal Grandmother        unsure what kind    Past Surgical History:  Procedure Laterality Date  . ELBOW SURGERY  2005   Right  . FOOT NEUROMA SURGERY  2006   Left  . HIP RESURFACING     left hip at [facility] 01/2011  . LUMBAR  LAMINECTOMY/DECOMPRESSION MICRODISCECTOMY  07/24/2011   Procedure: LUMBAR LAMINECTOMY/DECOMPRESSION MICRODISCECTOMY;  Surgeon: Eustace Moore;  Location: [place] [facility];  Service: Neurosurgery;  Laterality: Bilateral;  Bilateral  , Lumbar Three-Four, Lumbar Four-Five Decompressive Laminectomy Rm # 32  . LUNG SURGERY     for empyema  . SHOULDER ARTHROSCOPY Right 07/05/2013   Procedure: RIGHT SHOULDER ARTHROSCOPY WITH DEBRIDEMENT;  Surgeon: Mcarthur Rossetti, MD;  Location: [facility];  Service: Orthopedics;  Laterality: Right;  . TENDON TRANSFER Left 01/26/2014   Procedure: LEFT THUMB TRAPEZIECTOMY KNOTTED TENDON INTRAPOSITION TRANSFER OF ABDUCTOR POLLICIS LONGUS TO [place];  Surgeon: Cammie Sickle, MD;  Location: [place];  Service: Orthopedics;  Laterality: Left;  . THORACOTOMY  November 14 2010   rt   Social History   Occupational History  . Occupation: Games developer  Tobacco Use  . Smoking status: Never Smoker  . Smokeless tobacco: Never Used  Substance and Sexual Activity  . Alcohol use: No    Comment: quit drinking in 1989  . Drug use: No  . Sexual activity: Not on file

## 2018-06-23 ENCOUNTER — Emergency Department (HOSPITAL_COMMUNITY)
Admission: EM | Admit: 2018-06-23 | Discharge: 2018-06-24 | Disposition: A | Discharge status: Home / Self Care | Payer: BLUE CROSS/BLUE SHIELD | Attending: Emergency Medicine | Admitting: Emergency Medicine

## 2018-06-23 ENCOUNTER — Other Ambulatory Visit: Payer: Self-pay

## 2018-06-23 ENCOUNTER — Encounter (HOSPITAL_COMMUNITY): Payer: Self-pay | Admitting: *Deleted

## 2018-06-23 DIAGNOSIS — R443 Hallucinations, unspecified: Secondary | ICD-10-CM | POA: Diagnosis present

## 2018-06-23 DIAGNOSIS — Z79899 Other long term (current) drug therapy: Secondary | ICD-10-CM | POA: Diagnosis not present

## 2018-06-23 DIAGNOSIS — F319 Bipolar disorder, unspecified: Secondary | ICD-10-CM | POA: Diagnosis not present

## 2018-06-23 DIAGNOSIS — F19959 Other psychoactive substance use, unspecified with psychoactive substance-induced psychotic disorder, unspecified: Secondary | ICD-10-CM | POA: Insufficient documentation

## 2018-06-23 LAB — COMPREHENSIVE METABOLIC PANEL
ALT: 18 U/L (ref 0–44)
AST: 46 U/L — ABNORMAL HIGH (ref 15–41)
Albumin: 4.5 g/dL (ref 3.5–5.0)
Alkaline Phosphatase: 100 U/L (ref 38–126)
Anion gap: 10 (ref 5–15)
BUN: 18 mg/dL (ref 8–23)
CO2: 30 mmol/L (ref 22–32)
Calcium: 9 mg/dL (ref 8.9–10.3)
Chloride: 99 mmol/L (ref 98–111)
Creatinine, Ser: 0.75 mg/dL (ref 0.61–1.24)
GFR calc Af Amer: >60 mL/min (ref >60)
GFR calc non Af Amer: >60 mL/min (ref >60)
Glucose, Bld: 128 mg/dL — ABNORMAL HIGH (ref 70–99)
Potassium: 4.2 mmol/L (ref 3.5–5.1)
Sodium: 139 mmol/L (ref 135–145)
Total Bilirubin: 0.8 mg/dL (ref 0.3–1.2)
Total Protein: 7.4 g/dL (ref 6.5–8.1)

## 2018-06-23 LAB — RAPID URINE DRUG SCREEN, HOSP PERFORMED
Amphetamines: POSITIVE — AB
Barbiturates: NOT DETECTED
Benzodiazepines: NOT DETECTED
Cocaine: POSITIVE — AB
Opiates: POSITIVE — AB
Tetrahydrocannabinol: NOT DETECTED

## 2018-06-23 LAB — CBC WITH DIFFERENTIAL/PLATELET
Abs Immature Granulocytes: 0.03 10*3/uL (ref 0.00–0.07)
Basophils Absolute: 0.1 10*3/uL (ref 0.0–0.1)
Basophils Relative: 1 %
Eosinophils Absolute: 0 10*3/uL (ref 0.0–0.5)
Eosinophils Relative: 0 %
HCT: 40.9 % (ref 39.0–52.0)
Hemoglobin: 13.1 g/dL (ref 13.0–17.0)
Immature Granulocytes: 0 %
Lymphocytes Relative: 9 %
Lymphs Abs: 1.1 10*3/uL (ref 0.7–4.0)
MCH: 28.4 pg (ref 26.0–34.0)
MCHC: 32 g/dL (ref 30.0–36.0)
MCV: 88.5 fL (ref 80.0–100.0)
Monocytes Absolute: 1 10*3/uL (ref 0.1–1.0)
Monocytes Relative: 9 %
Neutro Abs: 9.7 10*3/uL — ABNORMAL HIGH (ref 1.7–7.7)
Neutrophils Relative %: 81 %
Platelets: 237 10*3/uL (ref 150–400)
RBC: 4.62 MIL/uL (ref 4.22–5.81)
RDW: 13.3 % (ref 11.5–15.5)
WBC: 11.9 10*3/uL — ABNORMAL HIGH (ref 4.0–10.5)
nRBC: 0 % (ref 0.0–0.2)

## 2018-06-23 LAB — URINALYSIS, ROUTINE W REFLEX MICROSCOPIC
Bilirubin Urine: NEGATIVE
Glucose, UA: NEGATIVE mg/dL
Hgb urine dipstick: NEGATIVE
Ketones, ur: 5 mg/dL — AB
Leukocytes, UA: NEGATIVE
Nitrite: NEGATIVE
Protein, ur: 30 mg/dL — AB
Specific Gravity, Urine: 1.028 (ref 1.005–1.030)
pH: 5 (ref 5.0–8.0)

## 2018-06-23 LAB — ETHANOL: Alcohol, Ethyl (B): <10 mg/dL (ref <10)

## 2018-06-23 MED ORDER — OLANZAPINE 10 MG PO TABS
10.0000 mg | ORAL_TABLET | Freq: Every day | ORAL | Status: DC
  Administered 2018-06-23: 10 mg via ORAL
  Filled 2018-06-23: qty 1

## 2018-06-23 MED ORDER — FLUOXETINE HCL 20 MG PO CAPS
20.0000 mg | ORAL_CAPSULE | Freq: Every day | ORAL | Status: DC
  Administered 2018-06-24: 20 mg via ORAL
  Filled 2018-06-23: qty 1

## 2018-06-23 MED ORDER — MORPHINE SULFATE ER 30 MG PO TBCR
30.0000 mg | EXTENDED_RELEASE_TABLET | Freq: Three times a day (TID) | ORAL | Status: DC
  Administered 2018-06-23 – 2018-06-24 (×3): 30 mg via ORAL
  Filled 2018-06-23 (×3): qty 2

## 2018-06-23 MED ORDER — HYDROMORPHONE HCL 2 MG PO TABS
4.0000 mg | ORAL_TABLET | Freq: Three times a day (TID) | ORAL | Status: DC | PRN
  Administered 2018-06-23 – 2018-06-24 (×2): 4 mg via ORAL
  Filled 2018-06-23 (×2): qty 2

## 2018-06-23 MED ORDER — ALPRAZOLAM 1 MG PO TABS
1.0000 mg | ORAL_TABLET | Freq: Three times a day (TID) | ORAL | Status: DC | PRN
  Administered 2018-06-23 – 2018-06-24 (×2): 1 mg via ORAL
  Filled 2018-06-23 (×2): qty 1

## 2018-06-23 NOTE — ED Triage Notes (Signed)
Pt's ex wife states the sheriff came to the pt's house 3 days ago because he believed someone had been in his house. Pt then stated he may have been hallucinating. Pt's wife states the sheriff called her this morning stating the pt had been calling them again about intruders in his house. Pt agreed to come to hospital with ex-wife, who is concerned he is paranoid and hallucinating. Pt states he has been seeing people that aren't there. Pt denies SI and HI. Pt states he is taking his psychiatric medications.

## 2018-06-23 NOTE — ED Notes (Signed)
Patient denies SI/HI but admits to AVH. Plan of care discussed. Encouragement and support provided and safety maintain. Q 15 min safety checks remain in place and video monitoring.

## 2018-06-23 NOTE — ED Notes (Signed)
Pt oriented to room and unit.  Pt appears somewhat manic.  Pt denies S/I, H/I, and AVH.  15 minute checks and video monitoring in place.

## 2018-06-23 NOTE — ED Notes (Signed)
Belongs moved to nursing station in [place]

## 2018-06-23 NOTE — BH Assessment (Signed)
Assessment Note  Paul Ray is an 62 y.o. male. Pt denies SI/HI. Pt admits to [place]. Pt states "I having a little bit of hallucinations but I don't like to call them that because I'm not on LSD." Pt states he's being seeing people breaking into his house but he also feels they may hallucinations. The Pt has called the Sherriff's department and there has been no forced entry into his home. The Pt states "I know how to stop them (hallucinations)" but the Pt would not disclose how he can stop them. The Pt is seen by Dr. Toy Care. The Pt states he is prescribed Xanax, Zyprexa, Adderall, and Prozac. Per Pt he is compliant with medication. Pt denies previous inpatient hospitalization. Pt denies SA but cocaine was found in his UDS.  Pt states he is close to his ex-wife but he does not want this write contact her for collateral information.  Dr. Mariea Clonts and Darnelle Maffucci, NP recommend am psych evaluation for safety and stabilization.  Diagnosis:  F31.9 Bipolar, manic, unspecified  Past Medical History:  Past Medical History:  Diagnosis Date  . Abscess of lung(513.0)    Right lower lobe  . ADD (attention deficit disorder)   . Anxiety   . Arthritis   . Bipolar depression ([place])   . Empyema lung ([place])   . Hemorrhoids   . Hepatitis C   . Pneumonia    last year  . Prostate abscess   . Prostate troubles     Past Surgical History:  Procedure Laterality Date  . ELBOW SURGERY  2005   Right  . FOOT NEUROMA SURGERY  2006   Left  . HIP RESURFACING     left hip at [facility] 01/2011  . LUMBAR LAMINECTOMY/DECOMPRESSION MICRODISCECTOMY  07/24/2011   Procedure: LUMBAR LAMINECTOMY/DECOMPRESSION MICRODISCECTOMY;  Surgeon: Eustace Moore;  Location: [facility];  Service: Neurosurgery;  Laterality: Bilateral;  Bilateral  , Lumbar Three-Four, Lumbar Four-Five Decompressive Laminectomy Rm # 32  . LUNG SURGERY     for empyema  . SHOULDER ARTHROSCOPY Right 07/05/2013   Procedure: RIGHT SHOULDER ARTHROSCOPY WITH DEBRIDEMENT;   Surgeon: Mcarthur Rossetti, MD;  Location: [facility];  Service: Orthopedics;  Laterality: Right;  . TENDON TRANSFER Left 01/26/2014   Procedure: LEFT THUMB TRAPEZIECTOMY KNOTTED TENDON INTRAPOSITION TRANSFER OF ABDUCTOR POLLICIS LONGUS TO [place];  Surgeon: Cammie Sickle, MD;  Location: [facility];  Service: Orthopedics;  Laterality: Left;  . THORACOTOMY  November 14 2010   rt    Family History:  Family History  Problem Relation Age of Onset  . COPD Mother   . Heart disease Mother   . Hypertension Mother   . Coronary artery disease Father   . Dementia Father   . Heart failure Father   . Prostate cancer Paternal Grandfather        also had bone cancer  . Breast cancer Maternal Grandmother   . Cancer Paternal Grandmother        unsure what kind    Social History:  reports that he has never smoked. He has never used smokeless tobacco. He reports that he does not drink alcohol or use drugs.  Additional Social History:  Alcohol / Drug Use Pain Medications: please see mar Prescriptions: please see mar Over the Counter: please see mar History of alcohol / drug use?: Yes Longest period of sobriety (when/how long): unknown Substance #1 Name of Substance 1: cocaine 1 - Age of First Use: unknown 1 - Amount (  size/oz): unknown 1 - Frequency: unknown 1 - Duration: unknown 1 - Last Use / Amount: unknown  CIWA: CIWA-Ar BP: (!) 142/71 Pulse Rate: 77 COWS:    Allergies: No Known Allergies  Home Medications:  (Not in a hospital admission)  OB/GYN Status:  No LMP for male patient.  General Assessment Data [facility]: [facility] TTS Assessment: In system Is this a Tele or Face-to-Face Assessment?: Face-to-Face Is this an Initial Assessment or a Re-assessment for this encounter?: Initial Assessment Patient Accompanied by:: N/A Language Other than English: No Living Arrangements: Other (Comment) What gender do you identify as?: Male Marital status:  Divorced [place] name: NA Pregnancy Status: No Living Arrangements: Alone Can pt return to current living arrangement?: Yes Admission Status: Voluntary Is patient capable of signing voluntary admission?: Yes Referral Source: Self/Family/Friend Insurance type: BCBS     Crisis Care Plan Living Arrangements: Alone Legal Guardian: Other:(self) Name of Psychiatrist: Dr. Toy Care Name of Therapist: NA  Education Status Is patient currently in school?: No Is the patient employed, unemployed or receiving disability?: Unemployed  Risk to self with the past 6 months Suicidal Ideation: No Has patient been a risk to self within the past 6 months prior to admission? : No Suicidal Intent: No Has patient had any suicidal intent within the past 6 months prior to admission? : No Is patient at risk for suicide?: No Suicidal Plan?: No Has patient had any suicidal plan within the past 6 months prior to admission? : No Access to Means: No What has been your use of drugs/alcohol within the last 12 months?: cocaine Previous Attempts/Gestures: No How many times?: 0 Other Self Harm Risks: NA Triggers for Past Attempts: None known Intentional Self Injurious Behavior: None Family Suicide History: No Recent stressful life event(s): Other (Comment)(pt denies) Persecutory voices/beliefs?: No Depression: No Depression Symptoms: (pt denies) Substance abuse history and/or treatment for substance abuse?: No Suicide prevention information given to non-admitted patients: Not applicable  Risk to Others within the past 6 months Homicidal Ideation: No Does patient have any lifetime risk of violence toward others beyond the six months prior to admission? : No Thoughts of Harm to Others: No Current Homicidal Intent: No Current Homicidal Plan: No Access to Homicidal Means: No Identified Victim: NA History of harm to others?: No Assessment of Violence: None Noted Violent Behavior Description: NA Does patient  have access to weapons?: No Criminal Charges Pending?: No Does patient have a court date: No Is patient on probation?: No  Psychosis Hallucinations: Auditory, Visual Delusions: None noted  Mental Status Report Appearance/Hygiene: Unremarkable Eye Contact: Fair Motor Activity: Freedom of movement Speech: Logical/coherent Level of Consciousness: Alert Mood: Anxious Affect: Anxious Anxiety Level: Minimal Thought Processes: Coherent, Relevant Judgement: Unimpaired Orientation: Person, Time, Place, Situation Obsessive Compulsive Thoughts/Behaviors: None  Cognitive Functioning Concentration: Normal Memory: Recent Intact, Remote Intact Is patient IDD: No Insight: Poor Impulse Control: Poor Appetite: Fair Have you had any weight changes? : No Change Sleep: No Change Total Hours of Sleep: 8 Vegetative Symptoms: None  ADLScreening [facility] Assessment Services) Patient's cognitive ability adequate to safely complete daily activities?: Yes Patient able to express need for assistance with ADLs?: Yes Independently performs ADLs?: Yes (appropriate for developmental age)  Prior Inpatient Therapy Prior Inpatient Therapy: No  Prior Outpatient Therapy Prior Outpatient Therapy: Yes Prior Therapy Dates: current Prior Therapy Facilty/Provider(s): Dr. Toy Care Reason for Treatment: bipolar, adhd Does patient have an ACCT team?: No Does patient have Intensive In-House Services?  : No Does patient  have [facility] services? : No Does patient have P4CC services?: No  ADL Screening (condition at time of admission) Patient's cognitive ability adequate to safely complete daily activities?: Yes Is the patient deaf or have difficulty hearing?: No Does the patient have difficulty seeing, even when wearing glasses/contacts?: No Does the patient have difficulty concentrating, remembering, or making decisions?: No Patient able to express need for assistance with ADLs?: Yes Does the patient have  difficulty dressing or bathing?: No Independently performs ADLs?: Yes (appropriate for developmental age)       Abuse/Neglect Assessment (Assessment to be complete while patient is alone) Abuse/Neglect Assessment Can Be Completed: Yes Physical Abuse: Denies Verbal Abuse: Denies Sexual Abuse: Denies Exploitation of patient/patient's resources: Denies     Regulatory affairs officer (For Healthcare) Does Patient Have a Medical Advance Directive?: No Would patient like information on creating a medical advance directive?: No - Patient declined          Disposition:  Disposition Initial Assessment Completed for this Encounter: Yes  On Site Evaluation by:   Reviewed with Physician:    Cyndia Bent 06/23/2018 2:42 PM

## 2018-06-23 NOTE — ED Notes (Signed)
Bed: [facility] Expected date:  Expected time:  Means of arrival:  Comments: 

## 2018-06-23 NOTE — ED Notes (Signed)
Bed: [facility] Expected date:  Expected time:  Means of arrival:  Comments: Hold for hall c

## 2018-06-24 DIAGNOSIS — F19959 Other psychoactive substance use, unspecified with psychoactive substance-induced psychotic disorder, unspecified: Secondary | ICD-10-CM | POA: Diagnosis present

## 2018-06-24 NOTE — ED Notes (Signed)
Patient refused discharge vital signs. 

## 2018-06-24 NOTE — Discharge Instructions (Signed)
For your behavioral health needs, you are advised to continue treatment with Rupinder Kaur, MD: ° °     Rupinder Kaur, MD °     [address] °     [place], [place] [address] °     [phone]

## 2018-06-24 NOTE — Consult Note (Signed)
[facility] Psych ED Discharge  06/24/2018 11:30 AM Paul Ray  MRN:  [id number] Principal Problem: Psychoactive substance-induced psychosis [facility]) Discharge Diagnoses: Principal Problem:   Psychoactive substance-induced psychosis ([place])   Subjective:  Per chart review, Paul Ray was admitted with paranoid and delusional thoughts that intruders were coming into his house. He called the police. His wife was concerned and brought him to the hospital. Yesterday, he reported that he saw "them" but they would run when he saw them. Today, he denies SI, HI or AVH. He denies thoughts that people are out to harm him. He feels safe returning home. He does not have access to guns or weapons. He denies cocaine use although UDS is positive for cocaine.   Total Time spent with patient: 30 minutes  Past Psychiatric History: ADD and anxiety.   Past Medical History:  Past Medical History:  Diagnosis Date  . Abscess of lung(513.0)    Right lower lobe  . ADD (attention deficit disorder)   . Anxiety   . Arthritis   . Bipolar depression ([place])   . Empyema lung ([place])   . Hemorrhoids   . Hepatitis C   . Pneumonia    last year  . Prostate abscess   . Prostate troubles    Past Surgical History:  Procedure Laterality Date  . ELBOW SURGERY  2005   Right  . FOOT NEUROMA SURGERY  2006   Left  . HIP RESURFACING     left hip at [facility] 01/2011  . LUMBAR LAMINECTOMY/DECOMPRESSION MICRODISCECTOMY  07/24/2011   Procedure: LUMBAR LAMINECTOMY/DECOMPRESSION MICRODISCECTOMY;  Surgeon: Eustace Moore;  Location: [place]UR[facility]S;  Service: Neurosurgery;  Laterality: Bilateral;  Bilateral  , Lumbar Three-Four, Lumbar Four-Five Decompressive Laminectomy Rm # 32  . LUNG SURGERY     for empyema  . SHOULDER ARTHROSCOPY Right 07/05/2013   Procedure: RIGHT SHOULDER ARTHROSCOPY WITH DEBRIDEMENT;  Surgeon: Mcarthur Rossetti, MD;  Location: [facility];  Service: Orthopedics;  Laterality: Right;  . TENDON TRANSFER Left 01/26/2014   Procedure: LEFT THUMB TRAPEZIECTOMY KNOTTED TENDON INTRAPOSITION TRANSFER OF ABDUCTOR POLLICIS LONGUS TO [place];  Surgeon: Cammie Sickle, MD;  Location: [facility];  Service: Orthopedics;  Laterality: Left;  . THORACOTOMY  November 14 2010   rt   Family History:  Family History  Problem Relation Age of Onset  . COPD Mother   . Heart disease Mother   . Hypertension Mother   . Coronary artery disease Father   . Dementia Father   . Heart failure Father   . Prostate cancer Paternal Grandfather        also had bone cancer  . Breast cancer Maternal Grandmother   . Cancer Paternal Grandmother        unsure what kind   Family Psychiatric  History: Father-dementia  Social History:  Social History   Substance and Sexual Activity  Alcohol Use No   Comment: quit drinking in 1989    Social History   Substance and Sexual Activity  Drug Use No   Social History   Socioeconomic History  . Marital status: Single    Spouse name: Not on file  . Number of children: 2  . Years of education: Not on file  . Highest education level: Not on file  Occupational History  . Occupation: Games developer  Social Needs  . Financial resource strain: Not on file  . Food insecurity:    Worry: Not on file    Inability: Not on  file  . Transportation needs:    Medical: Not on file    Non-medical: Not on file  Tobacco Use  . Smoking status: Never Smoker  . Smokeless tobacco: Never Used  Substance and Sexual Activity  . Alcohol use: No    Comment: quit drinking in 1989  . Drug use: No  . Sexual activity: Not on file  Lifestyle  . Physical activity:    Days per week: Not on file    Minutes per session: Not on file  . Stress: Not on file  Relationships  . Social connections:    Talks on phone: Not on file    Gets together: Not on file    Attends religious service: Not on file    Active member of club or organization: Not on file    Attends meetings of clubs or organizations: Not on  file    Relationship status: Not on file  Other Topics Concern  . Not on file  Social History Narrative   2 children: [name] and [name]    Has this patient used any form of tobacco in the last 30 days? (Cigarettes, Smokeless Tobacco, Cigars, and/or Pipes) Prescription not provided because: Patient does not use tobacco products.  Current Medications: Current Facility-Administered Medications  Medication Dose Route Frequency Provider Last Rate Last Dose  . ALPRAZolam Duanne Moron) tablet 1 mg  1 mg Oral TID PRN Money, Lowry Ram, FNP   1 mg at 06/24/18 9935  . FLUoxetine (PROZAC) capsule 20 mg  20 mg Oral Daily Money, Lowry Ram, FNP   20 mg at 06/24/18 0947  . HYDROmorphone (DILAUDID) tablet 4 mg  4 mg Oral Q8H PRN Money, Lowry Ram, FNP   4 mg at 06/24/18 7017  . morphine (MS CONTIN) 12 hr tablet 30 mg  30 mg Oral TID Money, Lowry Ram, FNP   30 mg at 06/24/18 0947  . OLANZapine (ZYPREXA) tablet 10 mg  10 mg Oral QHS Money, Lowry Ram, FNP   10 mg at 06/23/18 2106   Current Outpatient Medications  Medication Sig Dispense Refill  . alprazolam (XANAX) 2 MG tablet Take 2 mg by mouth 2 (two) times daily as needed for anxiety.    Marland Kitchen amphetamine-dextroamphetamine (ADDERALL XR) 30 MG 24 hr capsule Take 60 mg by mouth every morning.    Marland Kitchen amphetamine-dextroamphetamine (ADDERALL) 20 MG tablet Take 20 mg by mouth daily.  0  . FLUoxetine HCl (PROZAC PO) Take 20 mg by mouth daily.     . Glucosamine HCl-MSM (GLUCOSAMINE-MSM PO) Take 1 tablet by mouth every morning. 1500mg  of each drug    . HYDROmorphone (DILAUDID) 4 MG tablet Take 4 mg by mouth every 4 (four) hours.   0  . lamoTRIgine (LAMICTAL) 100 MG tablet Take 100 mg by mouth daily.     Marland Kitchen MAGNESIUM PO Take 1 tablet by mouth daily.    Marland Kitchen morphine (MS CONTIN) 30 MG 12 hr tablet Take 60 mg by mouth 2 (two) times daily.   0  . Multiple Vitamin (MULTIVITAMIN) capsule Take 1 capsule by mouth daily.      Marland Kitchen OLANZapine (ZYPREXA PO) Take 10 mg by mouth daily.     .  Tamsulosin HCl (FLOMAX) 0.4 MG CAPS Take 0.8 mg by mouth daily.      PTA Medications:  (Not in a hospital admission)  Musculoskeletal: Strength & Muscle Tone: within normal limits Gait & Station: normal Patient leans: N/A  Psychiatric Specialty Exam: Physical Exam  Nursing note and  vitals reviewed. Constitutional: He is oriented to person, place, and time. He appears well-developed and well-nourished.  HENT:  Head: Normocephalic and atraumatic.  Neck: Normal range of motion.  Respiratory: Effort normal.  Musculoskeletal: Normal range of motion.  Neurological: He is alert and oriented to person, place, and time.  Psychiatric: He has a normal mood and affect. His speech is normal and behavior is normal. Judgment and thought content normal. Cognition and memory are normal.    Review of Systems  Psychiatric/Behavioral: Positive for substance abuse. Negative for depression, hallucinations and suicidal ideas.  All other systems reviewed and are negative.   Blood pressure 137/79, pulse (!) 54, temperature 97.9 F (36.6 C), temperature source Oral, resp. rate 16, SpO2 97 %.There is no height or weight on file to calculate BMI.  General Appearance: Fairly Groomed, middle aged, Caucasian male, wearing paper hospital scrubs and lying in bed. NAD.   Eye Contact:  Good  Speech:  Clear and Coherent and Normal Rate  Volume:  Normal  Mood:  Euthymic  Affect:  Appropriate and Congruent  Thought Process:  Goal Directed, Linear and Descriptions of Associations: Intact  Orientation:  Full (Time, Place, and Person)  Thought Content:  Logical  Suicidal Thoughts:  No  Homicidal Thoughts:  No  Memory:  Immediate;   Good Recent;   Good Remote;   Good  Judgement:  Fair  Insight:  Fair  Psychomotor Activity:  Normal  Concentration:  Concentration: Good and Attention Span: Good  Recall:  Good  Fund of Knowledge:  Good  Language:  Good  Akathisia:  No  Handed:  Right  AIMS (if indicated):    N/A  Assets:  Communication Skills Desire for Improvement Financial Resources/Insurance Housing Physical Health Social Support  ADL's:  Intact  Cognition:  WNL  Sleep:   N/A     Demographic Factors:  Male, Divorced or widowed, Caucasian and Living alone  Loss Factors: NA  Historical Factors: Impulsivity  Risk Reduction Factors:   Employed and Positive social support  Continued Clinical Symptoms:  Alcohol/Substance Abuse/Dependencies More than one psychiatric diagnosis  Cognitive Features That Contribute To Risk:  None    Suicide Risk:  Minimal: No identifiable suicidal ideation.  Patients presenting with no risk factors but with morbid ruminations; may be classified as minimal risk based on the severity of the depressive symptoms   Assessment:  Paul Ray is a 62 y.o. male who was admitted with delusional and paranoid thoughts in the setting of cocaine use. He denies SI, HI or AVH and does not appear to be responding to internal stimuli. He denies cocaine use although UDS is positive. He does not warrant inpatient psychiatric hospitalization at this time.    Plan Of Care/Follow-up recommendations:  -Continue psychotropic medications as prescribed.  -Follow up with outpatient provider for medication management.  Disposition: Discharge home.  Faythe Dingwall, DO 06/24/2018, 11:30 AM

## 2018-06-24 NOTE — BH Assessment (Signed)
[facility] Assessment Progress Note  Per Buford Dresser, DO, this pt does not require psychiatric hospitalization at this time.  Pt is to be discharged from [facility] with recommendation to continue treatment with Paul May, MD.  This has been included in pt's discharge instructions.  Pt's nurse, Caren Griffins, has been notified.  Jalene Mullet, [place] Triage Specialist [phone]

## 2018-06-24 NOTE — ED Notes (Signed)
Pt demanding and verbally aggressive toward this Probation officer. Pts breakfast tray was accidentally thrown away and another tray was ordered as soon as it was realized. Pt was told of this mistake, informed that another tray was ordered and would be delievered directly to his room. Pt unsatisfied with this answer as he continued to argue with this writer about not having a tray yet. This Probation officer continued to try and console pt over situation. Again, pt continued to harp on situation. Pt requested to know what his medications were and what time they would be given. Pt was again demanding in this manner when speaking to this Probation officer. RN notified of situation.

## 2018-06-24 NOTE — ED Notes (Signed)
Pt observed to throw discharge papers after RN went over papers with them. Pt appears angry and aggressive; uninterested in treatment.

## 2018-06-24 NOTE — ED Notes (Signed)
Patient upset that adderall was not prescribed for him while in hospital. Nurse tried to explain that the doctor may have thought that the adderall was contributing to his confusion.  Patient has been rude to staff all morning.  Patient's meal tray was thrown away accidentally when he didn't eat his food.  This was explained to patient and apologies made and another meal tray was ordered but patient continued to reference this.

## 2018-06-27 NOTE — ED Provider Notes (Signed)
[facility] DEPT Provider Note   CSN: [id number] Arrival date & time: 06/23/18  1540     History   Chief Complaint Chief Complaint  Patient presents with  . Paranoid  . Hallucinations  . Headache    HPI Paul Ray is a 62 y.o. male.  HPI Patient presents to the emergency department with hallucinations and paranoia that people are in his house and attempting to harm him and brought him.  The patient states that he has been having this ongoing over the last couple weeks.  The patient's wife also gives me history that she found him out in the yard with a stick and a hammer threatening to harm whoever was in his house.  Patient does not give me much history other than this.  The patient speaks in very tangential sentences.  The patient acknowledges these could be hallucinations.  He states the people that he sees seem to be younger people.  Thinks that they were living in his attic.  The patient denies chest pain, shortness of breath, headache,blurred vision, neck pain, fever, cough, weakness, numbness, dizziness, anorexia, edema, abdominal pain, nausea, vomiting, diarrhea, rash, back pain, dysuria, hematemesis, bloody stool, near syncope, or syncope. Past Medical History:  Diagnosis Date  . Abscess of lung(513.0)    Right lower lobe  . ADD (attention deficit disorder)   . Anxiety   . Arthritis   . Bipolar depression ([place])   . Empyema lung ([place])   . Hemorrhoids   . Hepatitis C   . Pneumonia    last year  . Prostate abscess   . Prostate troubles     Patient Active Problem List   Diagnosis Date Noted  . Psychoactive substance-induced psychosis ([place])   . Rib pain 07/01/2017  . Closed fracture of one rib of left side 07/01/2017  . Chronic right shoulder pain 05/04/2017  . Impingement syndrome of right shoulder 07/05/2013  . Empyema lung ([place])   . Abscess of lung(513.0)   . Bipolar depression ([place])   . ADD (attention deficit disorder)   .  Hepatitis C   . Lung mass 10/22/2010  . Pleural effusion 10/15/2010    Past Surgical History:  Procedure Laterality Date  . ELBOW SURGERY  2005   Right  . FOOT NEUROMA SURGERY  2006   Left  . HIP RESURFACING     left hip at [facility] 01/2011  . LUMBAR LAMINECTOMY/DECOMPRESSION MICRODISCECTOMY  07/24/2011   Procedure: LUMBAR LAMINECTOMY/DECOMPRESSION MICRODISCECTOMY;  Surgeon: Eustace Moore;  Location: [facility] NEURO ORS;  Service: Neurosurgery;  Laterality: Bilateral;  Bilateral  , Lumbar Three-Four, Lumbar Four-Five Decompressive Laminectomy Rm # 32  . LUNG SURGERY     for empyema  . SHOULDER ARTHROSCOPY Right 07/05/2013   Procedure: RIGHT SHOULDER ARTHROSCOPY WITH DEBRIDEMENT;  Surgeon: Mcarthur Rossetti, MD;  Location: [facility];  Service: Orthopedics;  Laterality: Right;  . TENDON TRANSFER Left 01/26/2014   Procedure: LEFT THUMB TRAPEZIECTOMY KNOTTED TENDON INTRAPOSITION TRANSFER OF ABDUCTOR POLLICIS LONGUS TO [place];  Surgeon: Cammie Sickle, MD;  Location: [facility];  Service: Orthopedics;  Laterality: Left;  . THORACOTOMY  November 14 2010   rt        Home Medications    Prior to Admission medications   Medication Sig Start Date End Date Taking? Authorizing Provider  alprazolam Duanne Moron) 2 MG tablet Take 2 mg by mouth 2 (two) times daily as needed for anxiety. 10/28/16  Yes [provider]  amphetamine-dextroamphetamine (ADDERALL XR) 30 MG 24 hr capsule Take 60 mg by mouth every morning.   Yes [provider]  amphetamine-dextroamphetamine (ADDERALL) 20 MG tablet Take 20 mg by mouth daily. 08/13/16  Yes [provider]  FLUoxetine HCl (PROZAC PO) Take 20 mg by mouth daily.    Yes [provider]  Glucosamine HCl-MSM (GLUCOSAMINE-MSM PO) Take 1 tablet by mouth every morning. 1500mg  of each drug   Yes [provider]  HYDROmorphone (DILAUDID) 4 MG tablet Take 4 mg by mouth every 4 (four) hours.  08/14/16  Yes [provider]  lamoTRIgine (LAMICTAL) 100 MG tablet Take 100 mg by mouth daily.    Yes [provider]  MAGNESIUM PO Take 1 tablet by mouth daily.   Yes [provider]  morphine (MS CONTIN) 30 MG 12 hr tablet Take 60 mg by mouth 2 (two) times daily.  08/14/16  Yes [provider]  Multiple Vitamin (MULTIVITAMIN) capsule Take 1 capsule by mouth daily.     Yes [provider]  OLANZapine (ZYPREXA PO) Take 10 mg by mouth daily.    Yes [provider]  Tamsulosin HCl (FLOMAX) 0.4 MG CAPS Take 0.8 mg by mouth daily.    Yes [provider]    Family History Family History  Problem Relation Age of Onset  . COPD Mother   . Heart disease Mother   . Hypertension Mother   . Coronary artery disease Father   . Dementia Father   . Heart failure Father   . Prostate cancer Paternal Grandfather        also had bone cancer  . Breast cancer Maternal Grandmother   . Cancer Paternal Grandmother        unsure what kind    Social History Social History   Tobacco Use  . Smoking status: Never Smoker  . Smokeless tobacco: Never Used  Substance Use Topics  . Alcohol use: No    Comment: quit drinking in 1989  . Drug use: No     Allergies   Patient has no known allergies.   Review of Systems Review of Systems All other systems negative except as documented in the HPI. All pertinent positives and negatives as reviewed in the HPI.  Physical Exam Updated Vital Signs BP 137/79 (BP Location: Right Arm)   Pulse (!) 54 Comment: RN notified  Temp 97.9 F (36.6 C) (Oral)   Resp 16   SpO2 97%   Physical Exam  Constitutional: He is oriented to person, place, and time. He appears well-developed and well-nourished. No distress.  HENT:  Head: Normocephalic and atraumatic.  Mouth/Throat: Oropharynx is clear and moist.  Eyes: Pupils are equal, round, and reactive to light.  Neck: Normal range of motion. Neck supple.  Cardiovascular: Normal rate, regular  rhythm and normal heart sounds. Exam reveals no gallop and no friction rub.  No murmur heard. Pulmonary/Chest: Effort normal and breath sounds normal. No respiratory distress. He has no wheezes.  Abdominal: Soft. Bowel sounds are normal. He exhibits no distension. There is no tenderness.  Neurological: He is alert and oriented to person, place, and time. He exhibits normal muscle tone. Coordination normal.  Skin: Skin is warm and dry. Capillary refill takes less than 2 seconds. No rash noted. No erythema.  Psychiatric: His mood appears anxious. He is agitated. Thought content is paranoid and delusional. He expresses no homicidal and no suicidal ideation. He expresses no suicidal plans and no homicidal plans.  Nursing  note and vitals reviewed.    ED Treatments / Results  Labs (all labs ordered are listed, but only abnormal results are displayed) Labs Reviewed  COMPREHENSIVE METABOLIC PANEL - Abnormal; Notable for the following components:      Result Value   Glucose, Bld 128 (*)    AST 46 (*)    All other components within normal limits  RAPID URINE DRUG SCREEN, HOSP PERFORMED - Abnormal; Notable for the following components:   Opiates POSITIVE (*)    Cocaine POSITIVE (*)    Amphetamines POSITIVE (*)    All other components within normal limits  URINALYSIS, ROUTINE W REFLEX MICROSCOPIC - Abnormal; Notable for the following components:   Color, Urine AMBER (*)    APPearance HAZY (*)    Ketones, ur 5 (*)    Protein, ur 30 (*)    Bacteria, UA RARE (*)    All other components within normal limits  CBC WITH DIFFERENTIAL/PLATELET - Abnormal; Notable for the following components:   WBC 11.9 (*)    Neutro Abs 9.7 (*)    All other components within normal limits  ETHANOL    EKG None  Radiology No results found.  Procedures Procedures (including critical care time)  Medications Ordered in ED Medications - No data to display   Initial Impression / Assessment and Plan / ED  Course  I have reviewed the triage vital signs and the nursing notes.  Pertinent labs & imaging results that were available during my care of the patient were reviewed by me and considered in my medical decision making (see chart for details).     Patient will need TTS consultation for further evaluation of these hallucinations and paranoia. Final Clinical Impressions(s) / ED Diagnoses   Final diagnoses:  Psychoactive substance-induced psychosis [facility])    ED Discharge Orders         Ordered    Increase activity slowly     06/24/18 1045    Diet - low sodium heart healthy     06/24/18 [address] PA-C 06/27/18 1551    Lennice Sites, DO 06/27/18 1611

## 2018-07-04 ENCOUNTER — Encounter (HOSPITAL_COMMUNITY): Payer: Self-pay | Admitting: Emergency Medicine

## 2018-07-04 ENCOUNTER — Ambulatory Visit (HOSPITAL_COMMUNITY)
Admission: EM | Admit: 2018-07-04 | Discharge: 2018-07-04 | Disposition: A | Discharge status: Home / Self Care | Payer: BLUE CROSS/BLUE SHIELD | Attending: Physician Assistant | Admitting: Physician Assistant

## 2018-07-04 DIAGNOSIS — S40812A Abrasion of left upper arm, initial encounter: Secondary | ICD-10-CM

## 2018-07-04 DIAGNOSIS — W010XXA Fall on same level from slipping, tripping and stumbling without subsequent striking against object, initial encounter: Secondary | ICD-10-CM

## 2018-07-04 DIAGNOSIS — S80811A Abrasion, right lower leg, initial encounter: Secondary | ICD-10-CM | POA: Diagnosis not present

## 2018-07-04 DIAGNOSIS — S40811A Abrasion of right upper arm, initial encounter: Secondary | ICD-10-CM | POA: Diagnosis not present

## 2018-07-04 DIAGNOSIS — T148XXA Other injury of unspecified body region, initial encounter: Secondary | ICD-10-CM

## 2018-07-04 DIAGNOSIS — K1329 Other disturbances of oral epithelium, including tongue: Secondary | ICD-10-CM

## 2018-07-04 DIAGNOSIS — K146 Glossodynia: Secondary | ICD-10-CM

## 2018-07-04 DIAGNOSIS — S80812A Abrasion, left lower leg, initial encounter: Secondary | ICD-10-CM

## 2018-07-04 MED ORDER — LIDOCAINE VISCOUS HCL 2 % MT SOLN: 5.0000 mL | OROMUCOSAL | 0 refills | Status: DC | PRN

## 2018-07-04 MED ORDER — KETOROLAC TROMETHAMINE 30 MG/ML IJ SOLN
INTRAMUSCULAR | Status: AC
Start: 2018-07-04 — End: ?
  Filled 2018-07-04: qty 1

## 2018-07-04 MED ORDER — KETOROLAC TROMETHAMINE 30 MG/ML IJ SOLN
30.0000 mg | Freq: Once | INTRAMUSCULAR | Status: AC
  Administered 2018-07-04: 30 mg via INTRAMUSCULAR

## 2018-07-04 NOTE — ED Triage Notes (Signed)
Pt states he tripped and fell and bit his tongue yesterday, pt states he might need antibiotics for it.

## 2018-07-04 NOTE — ED Provider Notes (Signed)
07/04/2018 12:07 PM   DOB: 10-13-1955 / MRN: [id number]  SUBJECTIVE:  Paul Ray is a 62 y.o. male presenting for multiple scratches and a painful tongue.  This all happened yesterday when he states he was "running through the woods."  He is not willing to share with me why he was running through the woods.  He denies fever, chills, nausea.  He tells me "it hurts when I spit, and I spit a lot."  He is requesting a shot of Toradol.  He has no history of renal impairment.  He has No Known Allergies.   He  has a past medical history of Abscess of lung(513.0), ADD (attention deficit disorder), Anxiety, Arthritis, Bipolar depression ([place]), Empyema lung ([place]), Hemorrhoids, Hepatitis C, Pneumonia, Prostate abscess, and Prostate troubles.    He  reports that he has never smoked. He has never used smokeless tobacco. He reports that he does not drink alcohol or use drugs. He  has no sexual activity history on file. The patient  has a past surgical history that includes Foot neuroma surgery (2006); Elbow surgery (2005); Lung surgery; Hip resurfacing; Lumbar laminectomy/decompression microdiscectomy (07/24/2011); Thoracotomy (November 14 2010); Shoulder arthroscopy (Right, 07/05/2013); and Tendon transfer (Left, 01/26/2014).  His family history includes Breast cancer in his maternal grandmother; COPD in his mother; Cancer in his paternal grandmother; Coronary artery disease in his father; Dementia in his father; Heart disease in his mother; Heart failure in his father; Hypertension in his mother; Prostate cancer in his paternal grandfather.  Review of Systems  Constitutional: Negative for chills, diaphoresis and fever.  Gastrointestinal: Negative for nausea.  Skin: Positive for rash. Negative for itching.  Neurological: Negative for dizziness.    OBJECTIVE:  BP 134/81   Pulse 80   Temp 98.5 F (36.9 C)   Resp 16   SpO2 100%   Wt Readings from Last 3 Encounters:  12/22/16 170 lb (77.1 kg)  01/26/14 165 lb  (74.8 kg)  07/01/13 175 lb 4 oz (79.5 kg)   Temp Readings from Last 3 Encounters:  07/04/18 98.5 F (36.9 C)  06/24/18 97.9 F (36.6 C) (Oral)  03/16/18 97.7 F (36.5 C) (Oral)   BP Readings from Last 3 Encounters:  07/04/18 134/81  06/24/18 137/79  03/16/18 134/67   Pulse Readings from Last 3 Encounters:  07/04/18 80  06/24/18 (!) 54  03/16/18 76    Physical Exam  HENT:  Mouth/Throat:    Skin: Skin is warm.  Multiple abrasions about the arms and lower legs.  None exquisitely tender.  No foreign bodies.     No results found for this or any previous visit (from the past 72 hour(s)).  No results found.  ASSESSMENT AND PLAN:   Skin abrasion - No signs of cellulitis  Tongue pain - Secondary to biting his tongue.  No signs of an infection.  Viscous lidocaine should help.    Discharge Instructions     Please remember to apply the antibiotic along with Vaseline to your wounds.  I would use 2 parts antibiotic cream with 5 parts Vaseline.  Swish and spit the viscous lidocaine solution and use it as much as you like.  I see no indication at this time for antibiotics.  Please come back if you begin to have fever.        The patient is advised to call or return to clinic if he does not see an improvement in symptoms, or to seek the care of the closest emergency  department if he worsens with the above plan.   Philis Fendt, MHS, PA-C 07/04/2018 12:07 PM   Tereasa Coop, PA-C 07/04/18 1207

## 2018-07-04 NOTE — Discharge Instructions (Signed)
Please remember to apply the antibiotic along with Vaseline to your wounds.  I would use 2 parts antibiotic cream with 5 parts Vaseline.  Swish and spit the viscous lidocaine solution and use it as much as you like.  I see no indication at this time for antibiotics.  Please come back if you begin to have fever.

## 2018-07-07 ENCOUNTER — Ambulatory Visit (INDEPENDENT_AMBULATORY_CARE_PROVIDER_SITE_OTHER): Payer: BLUE CROSS/BLUE SHIELD | Admitting: Orthopaedic Surgery

## 2018-07-07 ENCOUNTER — Ambulatory Visit (INDEPENDENT_AMBULATORY_CARE_PROVIDER_SITE_OTHER): Payer: Self-pay

## 2018-07-07 ENCOUNTER — Encounter (INDEPENDENT_AMBULATORY_CARE_PROVIDER_SITE_OTHER): Payer: Self-pay | Admitting: Orthopaedic Surgery

## 2018-07-07 DIAGNOSIS — M79642 Pain in left hand: Secondary | ICD-10-CM

## 2018-07-07 MED ORDER — TRAMADOL HCL 50 MG PO TABS: 50.0000 mg | ORAL_TABLET | Freq: Four times a day (QID) | ORAL | 0 refills | Status: DC | PRN

## 2018-07-07 MED ORDER — DOXYCYCLINE HYCLATE 100 MG PO TABS: 100.0000 mg | ORAL_TABLET | Freq: Two times a day (BID) | ORAL | 0 refills | Status: DC

## 2018-07-07 NOTE — Progress Notes (Signed)
Office Visit Note   Patient: Paul Ray           Date of Birth: 04/06/1956           MRN: [id number] Visit Date: 07/07/2018              Requested by: Shirline Frees, MD [address] [place], [place] [id number] PCP: Shirline Frees, MD   Assessment & Plan: Visit Diagnoses:  1. Pain in left hand     Plan: As a precaution I am going to have him take doxycycline 100 mg twice a day for the next 10 days.  I would like to reevaluate him in a week to make sure things are fine.  He will follow-up more urgently or go to the emergency room if things are worsening.  Follow-Up Instructions: Return in about 1 week (around 07/14/2018).   Orders:  Orders Placed This Encounter  Procedures  . XR Hand Complete Left   Meds ordered this encounter  Medications  . doxycycline (VIBRA-TABS) 100 MG tablet    Sig: Take 1 tablet (100 mg total) by mouth 2 (two) times daily.    Dispense:  20 tablet    Refill:  0  . traMADol (ULTRAM) 50 MG tablet    Sig: Take 1-2 tablets (50-100 mg total) by mouth every 6 (six) hours as needed.    Dispense:  50 tablet    Refill:  0      Procedures: No procedures performed   Clinical Data: No additional findings.   Subjective: Chief Complaint  Patient presents with  . Left Hand - Pain  The patient comes in today with acute left hand pain for last 4 days.  He was running through the woods and unsure if he hit something or fell.  He said he said a lot of falls lately.  He does a lot of woodworking as well.  He is someone who is on chronic narcotics as well.  He denies any IV drug use or abuse.  HPI  Review of Systems He currently denies any fever, chills, nausea, vomiting.  He denies any chest pain or shortness of breath.  Objective: Vital Signs: There were no vitals taken for this visit.  Physical Exam He is alert and oriented and in no acute distress and does not appear septic Ortho Exam His left hand is examined and does show some  dorsal hand and wrist swelling with no significant cellulitis.  His range of motion is full but painful.  There is multiple scratches on both his arms. Specialty Comments:  No specialty comments available.  Imaging: Xr Hand Complete Left  Result Date: 07/07/2018 3 views of the left hand show no acute findings.  There is arthritic changes at the basilar thumb joint.  There is a foreign body in the palmar aspect of the hand.  There is no evidence of fracture or dislocation.    PMFS History: Patient Active Problem List   Diagnosis Date Noted  . Psychoactive substance-induced psychosis ([place])   . Rib pain 07/01/2017  . Closed fracture of one rib of left side 07/01/2017  . Chronic right shoulder pain 05/04/2017  . Impingement syndrome of right shoulder 07/05/2013  . Empyema lung ([place])   . Abscess of lung(513.0)   . Bipolar depression ([place])   . ADD (attention deficit disorder)   . Hepatitis C   . Lung mass 10/22/2010  . Pleural effusion 10/15/2010   Past Medical History:  Diagnosis  Date  . Abscess of lung(513.0)    Right lower lobe  . ADD (attention deficit disorder)   . Anxiety   . Arthritis   . Bipolar depression ([place])   . Empyema lung ([place])   . Hemorrhoids   . Hepatitis C   . Pneumonia    last year  . Prostate abscess   . Prostate troubles     Family History  Problem Relation Age of Onset  . COPD Mother   . Heart disease Mother   . Hypertension Mother   . Coronary artery disease Father   . Dementia Father   . Heart failure Father   . Prostate cancer Paternal Grandfather        also had bone cancer  . Breast cancer Maternal Grandmother   . Cancer Paternal Grandmother        unsure what kind    Past Surgical History:  Procedure Laterality Date  . ELBOW SURGERY  2005   Right  . FOOT NEUROMA SURGERY  2006   Left  . HIP RESURFACING     left hip [facility] 01/2011  . LUMBAR LAMINECTOMY/DECOMPRESSION MICRODISCECTOMY  07/24/2011   Procedure: LUMBAR  LAMINECTOMY/DECOMPRESSION MICRODISCECTOMY;  Surgeon: Eustace Moore;  Location: [place] [facility];  Service: Neurosurgery;  Laterality: Bilateral;  Bilateral  , Lumbar Three-Four, Lumbar Four-Five Decompressive Laminectomy Rm # 32  . LUNG SURGERY     for empyema  . SHOULDER ARTHROSCOPY Right 07/05/2013   Procedure: RIGHT SHOULDER ARTHROSCOPY WITH DEBRIDEMENT;  Surgeon: Mcarthur Rossetti, MD;  Location: [place];  Service: Orthopedics;  Laterality: Right;  . TENDON TRANSFER Left 01/26/2014   Procedure: LEFT THUMB TRAPEZIECTOMY KNOTTED TENDON INTRAPOSITION TRANSFER OF ABDUCTOR POLLICIS LONGUS TO [place];  Surgeon: Cammie Sickle, MD;  Location: [place];  Service: Orthopedics;  Laterality: Left;  . THORACOTOMY  November 14 2010   rt   Social History   Occupational History  . Occupation: Games developer  Tobacco Use  . Smoking status: Never Smoker  . Smokeless tobacco: Never Used  Substance and Sexual Activity  . Alcohol use: No    Comment: quit drinking in 1989  . Drug use: No  . Sexual activity: Not on file

## 2018-07-12 DIAGNOSIS — G894 Chronic pain syndrome: Secondary | ICD-10-CM | POA: Diagnosis not present

## 2018-07-12 DIAGNOSIS — Z79899 Other long term (current) drug therapy: Secondary | ICD-10-CM | POA: Diagnosis not present

## 2018-07-12 DIAGNOSIS — M545 Low back pain: Secondary | ICD-10-CM | POA: Diagnosis not present

## 2018-07-12 DIAGNOSIS — M129 Arthropathy, unspecified: Secondary | ICD-10-CM | POA: Diagnosis not present

## 2018-07-14 ENCOUNTER — Ambulatory Visit (INDEPENDENT_AMBULATORY_CARE_PROVIDER_SITE_OTHER): Payer: BLUE CROSS/BLUE SHIELD | Admitting: Orthopaedic Surgery

## 2018-07-14 ENCOUNTER — Encounter (INDEPENDENT_AMBULATORY_CARE_PROVIDER_SITE_OTHER): Payer: Self-pay | Admitting: Orthopaedic Surgery

## 2018-07-14 DIAGNOSIS — M79642 Pain in left hand: Secondary | ICD-10-CM

## 2018-07-14 NOTE — Progress Notes (Signed)
Paul Ray returns for follow-up for evaluation and treatment continued for his left hand and wrist swelling.  He is unsure how he injured it.  He had been running through the woods.  Last week when I saw him there was concerned about a possible infection so put him on oral antibiotics.  He is doing well overall.  X-rays were negative in terms of fracture.  He points to the dorsum of the fourth and fifth ray as a source of pain.  On exam he still has dorsal hand swelling but the redness is gone.  He appears comfortable.  I can move his hand around and there appears to be no evidence of tenosynovitis.  I gave him reassurance that swelling should continue to go down with time.  We will try least a removable wrist splint for comfort since he does a lot of work with his hands.  Follow-up will be as needed.  All question concerns were answered and addressed.

## 2018-07-15 DIAGNOSIS — F909 Attention-deficit hyperactivity disorder, unspecified type: Secondary | ICD-10-CM | POA: Diagnosis not present

## 2018-07-15 DIAGNOSIS — M545 Low back pain: Secondary | ICD-10-CM | POA: Diagnosis not present

## 2018-07-15 DIAGNOSIS — M5136 Other intervertebral disc degeneration, lumbar region: Secondary | ICD-10-CM | POA: Diagnosis not present

## 2018-07-15 DIAGNOSIS — M25551 Pain in right hip: Secondary | ICD-10-CM | POA: Diagnosis not present

## 2018-07-15 DIAGNOSIS — Z79899 Other long term (current) drug therapy: Secondary | ICD-10-CM | POA: Diagnosis not present

## 2018-08-09 ENCOUNTER — Telehealth (INDEPENDENT_AMBULATORY_CARE_PROVIDER_SITE_OTHER): Payer: Self-pay | Admitting: Radiology

## 2018-08-09 NOTE — Telephone Encounter (Signed)
Patient called triage line and states that he thinks he has really hurt his left hand this time. He was seen by Dr. Ninfa Linden in December but has fallen since then and is having increasing pain. I offered patient appointment for Wednesday, 08/11/2018.  He requested sooner appointment with Dr. Ninfa Linden, but none are available at this time. He asked if we would call him back if there was a cancellation for the afternoon and I advised we do not have a cancellation list but that he can call the main number for appointments periodically during the day to see if someone cancels. He then asked that I get a message to Dr. Ninfa Linden to let him know that he is in pain.  CB for patient is [phone]

## 2018-08-09 NOTE — Telephone Encounter (Signed)
See below

## 2018-08-09 NOTE — Telephone Encounter (Signed)
Patient aware of the below message  

## 2018-08-09 NOTE — Telephone Encounter (Signed)
Unfortunately I cannot see him this afternoon since we are incredibly booked and I am sick-under the weather.  If it is truly an emergency I would advise him to go to urgent care or the emergency room.  If not see if we can see him either tomorrow morning or Wednesday.

## 2018-08-11 ENCOUNTER — Ambulatory Visit (INDEPENDENT_AMBULATORY_CARE_PROVIDER_SITE_OTHER): Payer: BLUE CROSS/BLUE SHIELD | Admitting: Orthopaedic Surgery

## 2018-08-12 ENCOUNTER — Ambulatory Visit (INDEPENDENT_AMBULATORY_CARE_PROVIDER_SITE_OTHER): Payer: BLUE CROSS/BLUE SHIELD | Admitting: Orthopaedic Surgery

## 2018-08-12 ENCOUNTER — Ambulatory Visit (INDEPENDENT_AMBULATORY_CARE_PROVIDER_SITE_OTHER): Payer: Self-pay

## 2018-08-12 ENCOUNTER — Encounter (INDEPENDENT_AMBULATORY_CARE_PROVIDER_SITE_OTHER): Payer: Self-pay | Admitting: Orthopaedic Surgery

## 2018-08-12 DIAGNOSIS — M79642 Pain in left hand: Secondary | ICD-10-CM | POA: Diagnosis not present

## 2018-08-12 NOTE — Progress Notes (Signed)
The patient is well-known to me.  He had a mechanical fall a day or 2 ago onto his left hand.  He has had pain since then and will to make sure he did not have a fracture.  We saw him actually a month ago after he had a similar fall in his left hand was swollen then with no fracture.  On examination of his left hand today there is no significant swelling and is much less than before.  His range of motion is full at the wrist and his fingers and thumb.  He hurts over the fourth and fifth rays.  3 views of his left hand are obtained and show no obvious fracture dislocation or malalignment or acute injury.  He does have some mild arthritic changes in the MCP joints.  He has had previous [place] arthroplasty at the base of the thumb.  I gave him reassurance that his hand looks good and that his pain is reasonable considering he had a hard fall.  All question concerns were answered and addressed.  Follow-up will be as needed.

## 2018-08-23 DIAGNOSIS — M5136 Other intervertebral disc degeneration, lumbar region: Secondary | ICD-10-CM | POA: Diagnosis not present

## 2018-08-23 DIAGNOSIS — M545 Low back pain: Secondary | ICD-10-CM | POA: Diagnosis not present

## 2018-08-23 DIAGNOSIS — Z79899 Other long term (current) drug therapy: Secondary | ICD-10-CM | POA: Diagnosis not present

## 2018-08-23 DIAGNOSIS — M25551 Pain in right hip: Secondary | ICD-10-CM | POA: Diagnosis not present

## 2018-09-20 DIAGNOSIS — F3175 Bipolar disorder, in partial remission, most recent episode depressed: Secondary | ICD-10-CM | POA: Diagnosis not present

## 2018-09-20 DIAGNOSIS — M5136 Other intervertebral disc degeneration, lumbar region: Secondary | ICD-10-CM | POA: Diagnosis not present

## 2018-09-20 DIAGNOSIS — F909 Attention-deficit hyperactivity disorder, unspecified type: Secondary | ICD-10-CM | POA: Diagnosis not present

## 2018-09-20 DIAGNOSIS — M545 Low back pain: Secondary | ICD-10-CM | POA: Diagnosis not present

## 2018-09-20 DIAGNOSIS — Z79899 Other long term (current) drug therapy: Secondary | ICD-10-CM | POA: Diagnosis not present

## 2018-10-04 ENCOUNTER — Ambulatory Visit (INDEPENDENT_AMBULATORY_CARE_PROVIDER_SITE_OTHER): Payer: BLUE CROSS/BLUE SHIELD | Admitting: Orthopaedic Surgery

## 2018-10-18 DIAGNOSIS — Z113 Encounter for screening for infections with a predominantly sexual mode of transmission: Secondary | ICD-10-CM | POA: Diagnosis not present

## 2018-10-18 DIAGNOSIS — E559 Vitamin D deficiency, unspecified: Secondary | ICD-10-CM | POA: Diagnosis not present

## 2018-10-18 DIAGNOSIS — R5383 Other fatigue: Secondary | ICD-10-CM | POA: Diagnosis not present

## 2018-10-18 DIAGNOSIS — M5136 Other intervertebral disc degeneration, lumbar region: Secondary | ICD-10-CM | POA: Diagnosis not present

## 2018-10-18 DIAGNOSIS — E7800 Pure hypercholesterolemia, unspecified: Secondary | ICD-10-CM | POA: Diagnosis not present

## 2018-10-18 DIAGNOSIS — R300 Dysuria: Secondary | ICD-10-CM | POA: Diagnosis not present

## 2018-10-18 DIAGNOSIS — M545 Low back pain: Secondary | ICD-10-CM | POA: Diagnosis not present

## 2018-10-18 DIAGNOSIS — E291 Testicular hypofunction: Secondary | ICD-10-CM | POA: Diagnosis not present

## 2018-10-18 DIAGNOSIS — Z114 Encounter for screening for human immunodeficiency virus [HIV]: Secondary | ICD-10-CM | POA: Diagnosis not present

## 2018-11-15 DIAGNOSIS — Z79899 Other long term (current) drug therapy: Secondary | ICD-10-CM | POA: Diagnosis not present

## 2018-11-15 DIAGNOSIS — M5136 Other intervertebral disc degeneration, lumbar region: Secondary | ICD-10-CM | POA: Diagnosis not present

## 2018-11-15 DIAGNOSIS — M545 Low back pain: Secondary | ICD-10-CM | POA: Diagnosis not present

## 2018-11-15 DIAGNOSIS — F319 Bipolar disorder, unspecified: Secondary | ICD-10-CM | POA: Diagnosis not present

## 2018-11-23 DIAGNOSIS — D485 Neoplasm of uncertain behavior of skin: Secondary | ICD-10-CM | POA: Diagnosis not present

## 2018-11-23 DIAGNOSIS — L720 Epidermal cyst: Secondary | ICD-10-CM | POA: Diagnosis not present

## 2018-11-23 DIAGNOSIS — L570 Actinic keratosis: Secondary | ICD-10-CM | POA: Diagnosis not present

## 2018-12-13 DIAGNOSIS — Z79899 Other long term (current) drug therapy: Secondary | ICD-10-CM | POA: Diagnosis not present

## 2018-12-13 DIAGNOSIS — M545 Low back pain: Secondary | ICD-10-CM | POA: Diagnosis not present

## 2018-12-13 DIAGNOSIS — M5136 Other intervertebral disc degeneration, lumbar region: Secondary | ICD-10-CM | POA: Diagnosis not present

## 2018-12-13 DIAGNOSIS — F3175 Bipolar disorder, in partial remission, most recent episode depressed: Secondary | ICD-10-CM | POA: Diagnosis not present

## 2018-12-13 DIAGNOSIS — F909 Attention-deficit hyperactivity disorder, unspecified type: Secondary | ICD-10-CM | POA: Diagnosis not present

## 2019-01-05 DIAGNOSIS — F900 Attention-deficit hyperactivity disorder, predominantly inattentive type: Secondary | ICD-10-CM | POA: Diagnosis not present

## 2019-01-05 DIAGNOSIS — F3181 Bipolar II disorder: Secondary | ICD-10-CM | POA: Diagnosis not present

## 2019-01-05 DIAGNOSIS — F410 Panic disorder [episodic paroxysmal anxiety] without agoraphobia: Secondary | ICD-10-CM | POA: Diagnosis not present

## 2019-01-07 ENCOUNTER — Telehealth: Payer: Self-pay | Admitting: Orthopaedic Surgery

## 2019-01-07 NOTE — Telephone Encounter (Signed)
Pt called left vm. Called pt back no vm or no messgae. Stated pain in right shoulder.

## 2019-01-10 DIAGNOSIS — M25511 Pain in right shoulder: Secondary | ICD-10-CM | POA: Diagnosis not present

## 2019-01-10 DIAGNOSIS — M5136 Other intervertebral disc degeneration, lumbar region: Secondary | ICD-10-CM | POA: Diagnosis not present

## 2019-01-10 DIAGNOSIS — F319 Bipolar disorder, unspecified: Secondary | ICD-10-CM | POA: Diagnosis not present

## 2019-01-10 DIAGNOSIS — Z79899 Other long term (current) drug therapy: Secondary | ICD-10-CM | POA: Diagnosis not present

## 2019-01-10 DIAGNOSIS — M545 Low back pain: Secondary | ICD-10-CM | POA: Diagnosis not present

## 2019-01-18 ENCOUNTER — Ambulatory Visit (INDEPENDENT_AMBULATORY_CARE_PROVIDER_SITE_OTHER): Payer: BC Managed Care – PPO | Admitting: Orthopaedic Surgery

## 2019-01-18 ENCOUNTER — Other Ambulatory Visit: Payer: Self-pay

## 2019-01-18 ENCOUNTER — Ambulatory Visit: Payer: BC Managed Care – PPO

## 2019-01-18 ENCOUNTER — Encounter: Payer: Self-pay | Admitting: Orthopaedic Surgery

## 2019-01-18 DIAGNOSIS — G8929 Other chronic pain: Secondary | ICD-10-CM | POA: Diagnosis not present

## 2019-01-18 DIAGNOSIS — M25511 Pain in right shoulder: Secondary | ICD-10-CM

## 2019-01-18 MED ORDER — METHYLPREDNISOLONE ACETATE 40 MG/ML IJ SUSP
40.0000 mg | INTRAMUSCULAR | Status: AC | PRN
  Administered 2019-01-18: 40 mg via INTRA_ARTICULAR

## 2019-01-18 MED ORDER — LIDOCAINE HCL 1 % IJ SOLN
3.0000 mL | INTRAMUSCULAR | Status: AC | PRN
  Administered 2019-01-18: 3 mL

## 2019-01-18 NOTE — Progress Notes (Signed)
Office Visit Note   Patient: Paul Ray           Date of Birth: [birth year]           MRN: [id number] Visit Date: 01/18/2019              Requested by: Shirline Frees, MD [address],  [place] [id number] PCP: Shirline Frees, MD   Assessment & Plan: Visit Diagnoses:  1. Chronic right shoulder pain     Plan: I did provide a steroid injection in his right shoulder just to help with acute pain.  I like to obtain a CT scan with thin cuts at this point of the right shoulder to further evaluate the arthritic changes in his shoulder as well as to determine whether or not he is a candidate for shoulder replacement surgery at this point given the worsening of his arthritis of the glenohumeral joint.  All question concerns were answered and addressed.  We will see him back after the CT is obtained.  Follow-Up Instructions: Return in about 2 weeks (around 02/01/2019).   Orders:  Orders Placed This Encounter  Procedures  . Large Joint Inj  . XR Shoulder Right   No orders of the defined types were placed in this encounter.     Procedures: Large Joint Inj: R subacromial bursa on 01/18/2019 9:59 AM Indications: pain and diagnostic evaluation Details: 22 G 1.5 in needle  Arthrogram: No  Medications: 3 mL lidocaine 1 %; 40 mg methylPREDNISolone acetate 40 MG/ML Outcome: tolerated well, no immediate complications Procedure, treatment alternatives, risks and benefits explained, specific risks discussed. Consent was given by the patient. Immediately prior to procedure a time out was called to verify the correct patient, procedure, equipment, support staff and site/side marked as required. Patient was prepped and draped in the usual sterile fashion.       Clinical Data: No additional findings.   Subjective: Chief Complaint  Patient presents with  . Right Shoulder - Pain  The patient comes in today with acute on chronic right shoulder pain.  He is right-hand  dominant.  He has had previous impingement surgery with arthroscopy on the right shoulder and AC joint resection.  He has good mobility and range of motion the shoulder but he has had previous evidence of glenohumeral arthritic changes and this was seen on his arthroscopy as well.  He deals with chronic pain and sees a chronic pain specialist with being on narcotics on a daily basis.  He does feel a grinding worsening in his right shoulder and pain that is worsening.  HPI  Review of Systems He currently denies any headache, chest pain, shortness of breath, fever, chills, nausea, vomiting  Objective: Vital Signs: There were no vitals taken for this visit.  Physical Exam He is alert and orient x3 and in no acute distress Ortho Exam Examination of his right shoulder does show full range of motion but it is painful.  There is some slight grinding in the shoulder that he can feel as well on exam.  The rotator cuff itself feels strong. Specialty Comments:  No specialty comments available.  Imaging: Xr Shoulder Right  Result Date: 01/18/2019 3 views of the right shoulder show well located shoulder.  There is evidence of worsening glenohumeral arthritic changes.  There is evidence of previous AC joint resection.    PMFS History: Patient Active Problem List   Diagnosis Date Noted  . Psychoactive substance-induced psychosis ([place])   .  Rib pain 07/01/2017  . Closed fracture of one rib of left side 07/01/2017  . Chronic right shoulder pain 05/04/2017  . Impingement syndrome of right shoulder 07/05/2013  . Empyema lung ([place])   . Abscess of lung(513.0)   . Bipolar depression ([place])   . ADD (attention deficit disorder)   . Hepatitis C   . Lung mass 10/22/2010  . Pleural effusion 10/15/2010   Past Medical History:  Diagnosis Date  . Abscess of lung(513.0)    Right lower lobe  . ADD (attention deficit disorder)   . Anxiety   . Arthritis   . Bipolar depression ([place])   . Empyema lung ([place])    . Hemorrhoids   . Hepatitis C   . Pneumonia    last year  . Prostate abscess   . Prostate troubles     Family History  Problem Relation Age of Onset  . COPD Mother   . Heart disease Mother   . Hypertension Mother   . Coronary artery disease Father   . Dementia Father   . Heart failure Father   . Prostate cancer Paternal Grandfather        also had bone cancer  . Breast cancer Maternal Grandmother   . Cancer Paternal Grandmother        unsure what kind    Past Surgical History:  Procedure Laterality Date  . ELBOW SURGERY  2005   Right  . FOOT NEUROMA SURGERY  2006   Left  . HIP RESURFACING     [facility] hip [facility]  . LUMBAR LAMINECTOMY/DECOMPRESSION MICRODISCECTOMY  07/24/2011   Procedure: LUMBAR LAMINECTOMY/DECOMPRESSION MICRODISCECTOMY;  Surgeon: Eustace Moore;  Location: [place] [facility];  Service: Neurosurgery;  Laterality: Bilateral;  Bilateral  , Lumbar Three-Four, Lumbar Four-Five Decompressive Laminectomy Rm # 32  . LUNG SURGERY     for empyema  . SHOULDER ARTHROSCOPY Right 07/05/2013   Procedure: RIGHT SHOULDER ARTHROSCOPY WITH DEBRIDEMENT;  Surgeon: Mcarthur Rossetti, MD;  Location: [place];  Service: Orthopedics;  Laterality: Right;  . TENDON TRANSFER Left 01/26/2014   Procedure: LEFT THUMB TRAPEZIECTOMY KNOTTED TENDON INTRAPOSITION TRANSFER OF ABDUCTOR POLLICIS LONGUS TO [place];  Surgeon: Cammie Sickle, MD;  Location: [place];  Service: Orthopedics;  Laterality: Left;  . THORACOTOMY  November 14 2010   rt   Social History   Occupational History  . Occupation: Games developer  Tobacco Use  . Smoking status: Never Smoker  . Smokeless tobacco: Never Used  Substance and Sexual Activity  . Alcohol use: No    Comment: quit drinking in 1989  . Drug use: No  . Sexual activity: Not on file

## 2019-01-26 ENCOUNTER — Telehealth: Payer: Self-pay | Admitting: Orthopedic Surgery

## 2019-01-26 NOTE — Telephone Encounter (Signed)
Paul Ray called in to confirm his 02/01/19 appointment with Dr. Ninfa Linden.  He also asked how often Dr. Ninfa Linden would give cortisone injections.  He states that he has had several joints injected throughout his lifetime and they usually never work.  The 2 that he received in the arm from Dr. Ninfa Linden helped quite a bit.  Please call to discuss. Thank you!

## 2019-01-26 NOTE — Telephone Encounter (Signed)
LMOM for patient letting him know it has to be three months in between injections

## 2019-02-01 ENCOUNTER — Ambulatory Visit: Payer: BC Managed Care – PPO | Admitting: Orthopaedic Surgery

## 2019-02-07 DIAGNOSIS — M545 Low back pain: Secondary | ICD-10-CM | POA: Diagnosis not present

## 2019-02-07 DIAGNOSIS — Z79899 Other long term (current) drug therapy: Secondary | ICD-10-CM | POA: Diagnosis not present

## 2019-02-07 DIAGNOSIS — M25511 Pain in right shoulder: Secondary | ICD-10-CM | POA: Diagnosis not present

## 2019-02-07 DIAGNOSIS — M5136 Other intervertebral disc degeneration, lumbar region: Secondary | ICD-10-CM | POA: Diagnosis not present

## 2019-02-09 ENCOUNTER — Ambulatory Visit
Admission: RE | Admit: 2019-02-09 | Discharge: 2019-02-09 | Disposition: A | Discharge status: Home / Self Care | Payer: BC Managed Care – PPO | Source: Ambulatory Visit | Attending: Orthopaedic Surgery | Admitting: Orthopaedic Surgery

## 2019-02-09 DIAGNOSIS — M25511 Pain in right shoulder: Secondary | ICD-10-CM

## 2019-02-09 DIAGNOSIS — M19011 Primary osteoarthritis, right shoulder: Secondary | ICD-10-CM | POA: Diagnosis not present

## 2019-02-09 DIAGNOSIS — G8929 Other chronic pain: Secondary | ICD-10-CM

## 2019-02-14 ENCOUNTER — Ambulatory Visit (INDEPENDENT_AMBULATORY_CARE_PROVIDER_SITE_OTHER): Payer: BC Managed Care – PPO | Admitting: Orthopaedic Surgery

## 2019-02-14 ENCOUNTER — Other Ambulatory Visit: Payer: Self-pay

## 2019-02-14 ENCOUNTER — Encounter: Payer: Self-pay | Admitting: Orthopaedic Surgery

## 2019-02-14 DIAGNOSIS — M25511 Pain in right shoulder: Secondary | ICD-10-CM | POA: Diagnosis not present

## 2019-02-14 DIAGNOSIS — G8929 Other chronic pain: Secondary | ICD-10-CM

## 2019-02-14 IMAGING — CT CT ABD-PELV W/ CM
2 of 5 series · 16 of 46 positions shown, 18 images · IV contrast (Omni 300)
Comparison: 10/15/2011

CLINICAL DATA: Diarrhea for 2 weeks, awoke this morning with
abdominal pain, diarrhea and nausea

EXAM:
CT ABDOMEN AND PELVIS WITH CONTRAST
TECHNIQUE: Multidetector CT imaging of the abdomen and pelvis was performed
using the standard protocol following bolus administration of
intravenous contrast. Sagittal and coronal MPR images reconstructed
from axial data set.
CONTRAST:  100mL [id number] IOPAMIDOL ([id number]) INJECTION 61% IV.
No oral contrast.

[Series 3: a/p w/ 5mm · axial · 0.76mm/px · z∈[+891,+1306]mm · 13 of 93 slices shown, 15 images]
[im 5/93  soft-tissue]
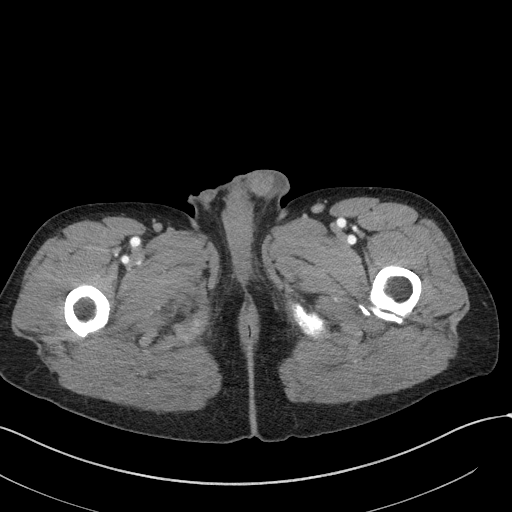
[im 5/93  bone]
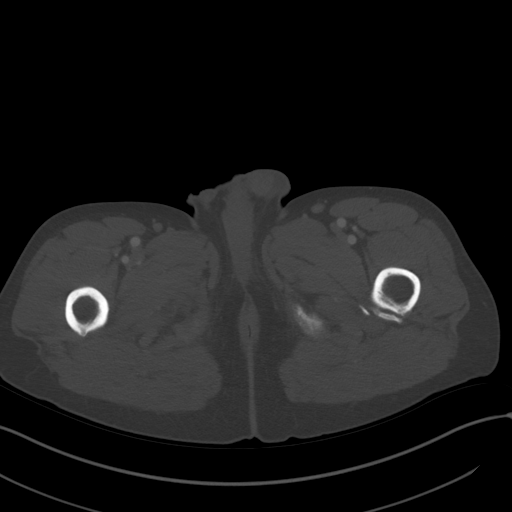
[im 15/93  soft-tissue]
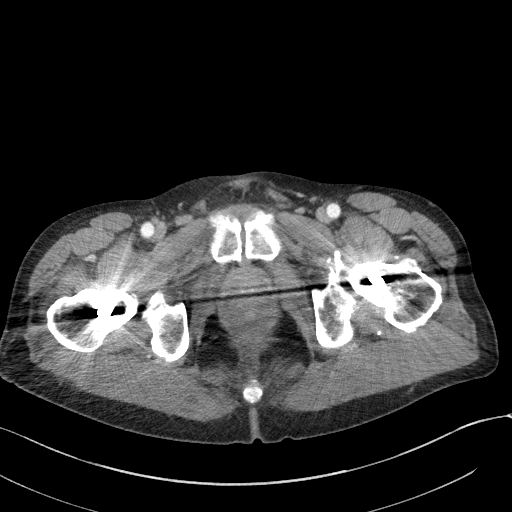
[im 20/93  soft-tissue]
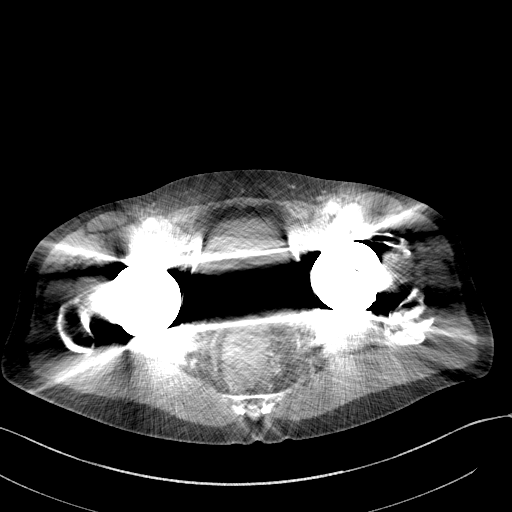
[im 25/93  soft-tissue]
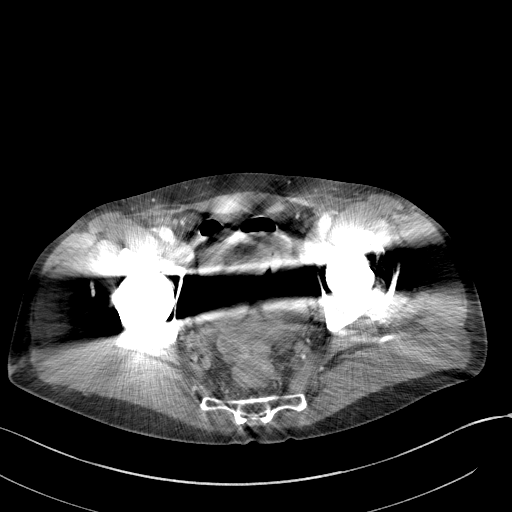
[im 34/93  soft-tissue]
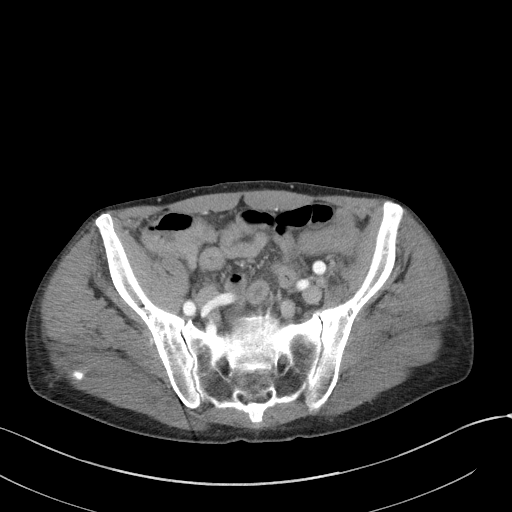
[im 39/93  soft-tissue]
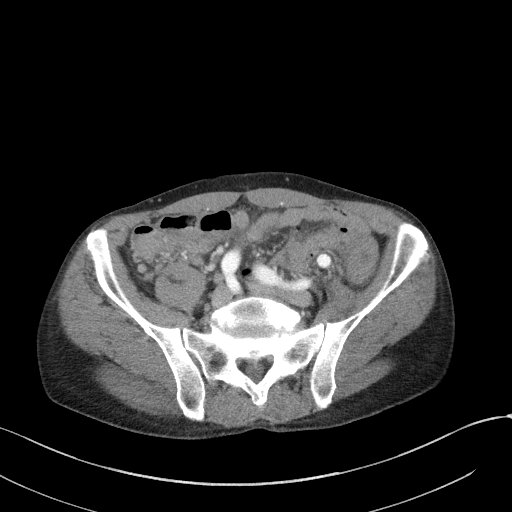
[im 49/93  soft-tissue]
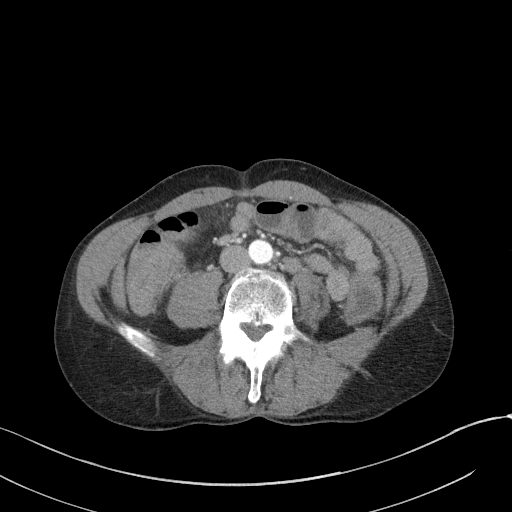
[im 54/93  soft-tissue]
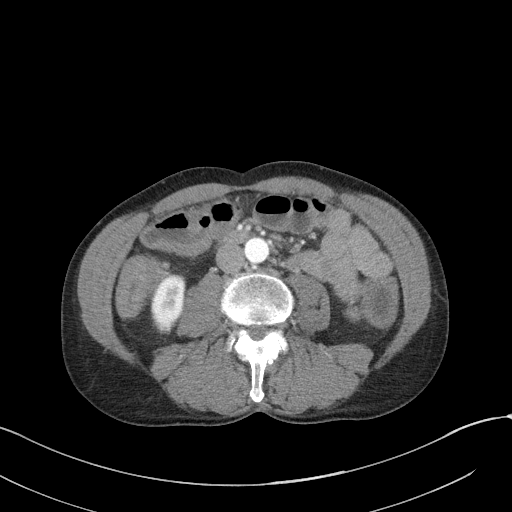
[im 59/93  soft-tissue]
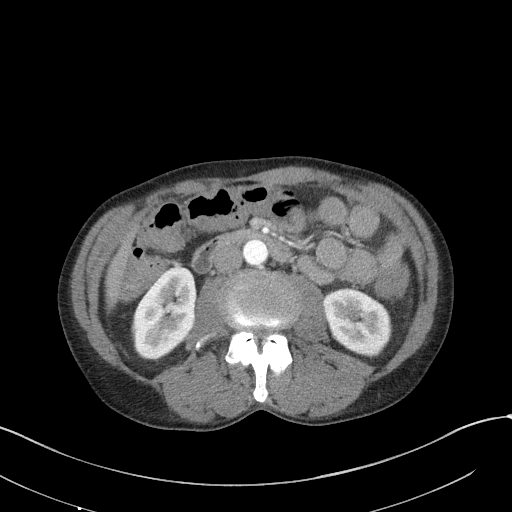
[im 59/93  bone]
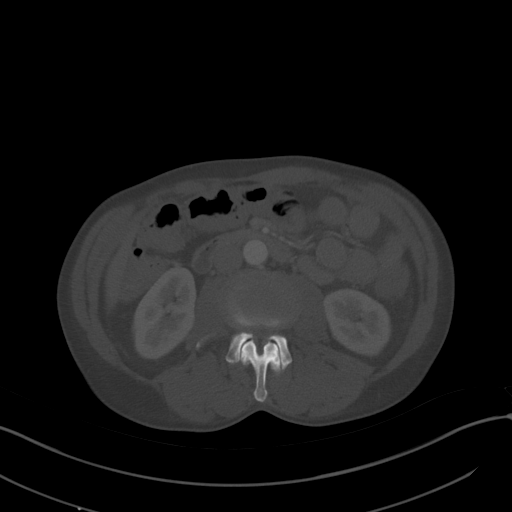
[im 68/93  soft-tissue]
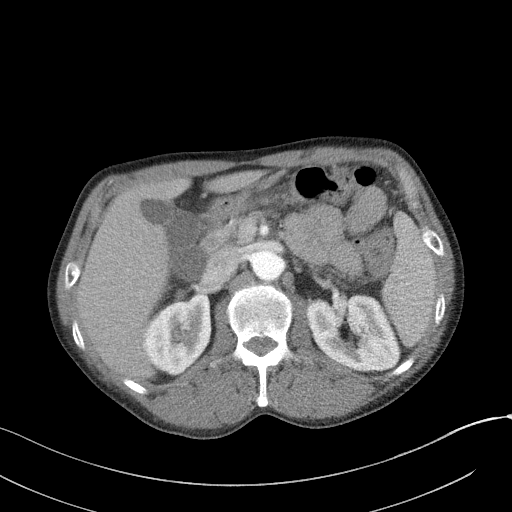
[im 73/93  soft-tissue]
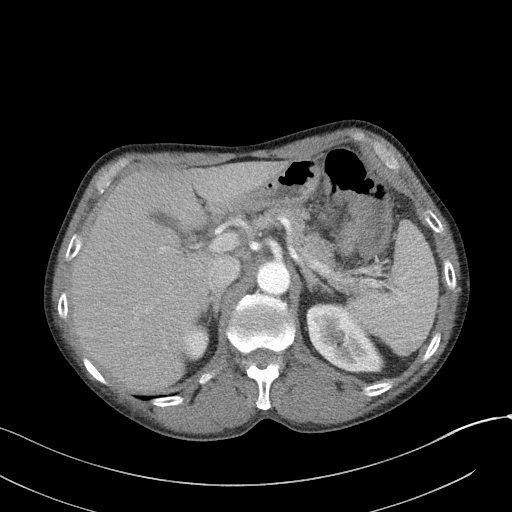
[im 78/93  soft-tissue]
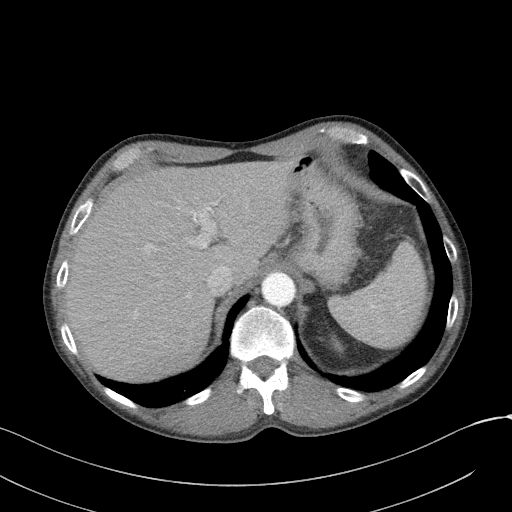
[im 88/93  soft-tissue]
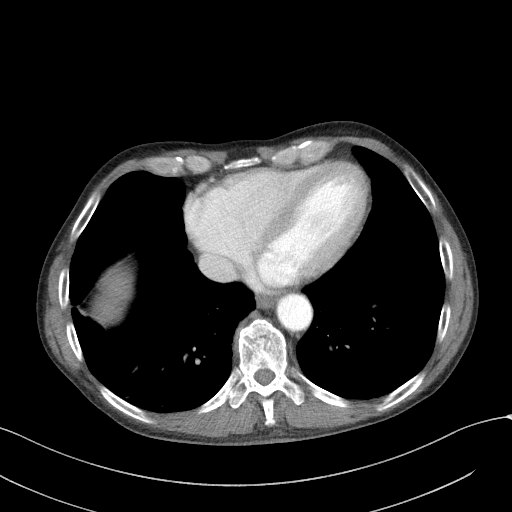

[Series 6: a/p w/ cor · coronal · 0.75mm/px · 3 of 135 slices shown]
[im 45/135  soft-tissue]
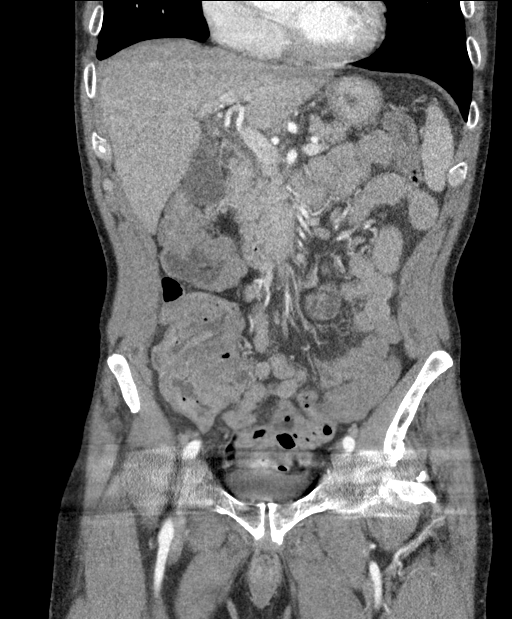
[im 60/135  soft-tissue]
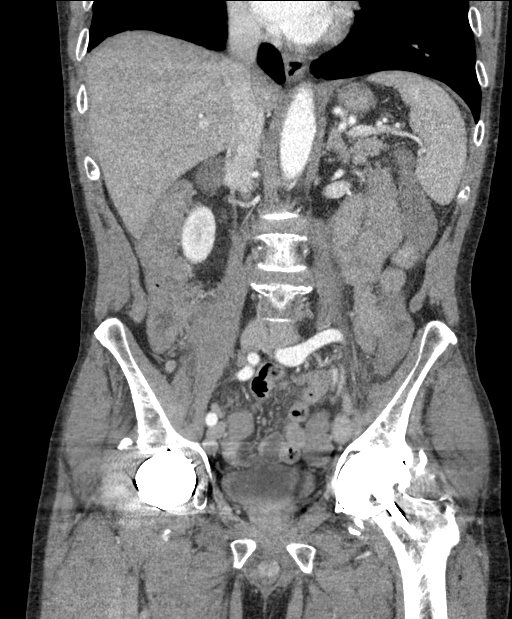
[im 75/135  soft-tissue]
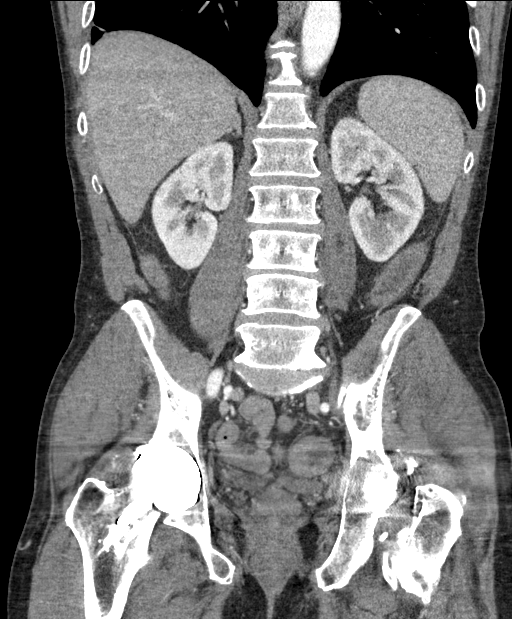

[16 of 46 positions shown; findings below may reference images not displayed]

FINDINGS: Lower chest: Dependent atelectasis RIGHT lower lobe.

Hepatobiliary: Gallbladder and liver unremarkable

Pancreas: Normal appearance

Spleen: Normal appearance

Adrenals/Urinary Tract: Adrenal glands normal appearance. Small
RIGHT renal cyst. Kidneys, and proximal/mid ureters normal
appearance. Distal ureters and bladder predominantly obscured by
beam hardening artifacts in pelvis, with visualized portions of the
anterior bladder grossly normal appearance.

Stomach/Bowel: Normal appendix coiled adjacent to cecal tip.
Probable wall thickening of the colon at scattered sites with subtle
adjacent infiltrative changes of pericolic fat suggesting colitis.
Bowel loops are suboptimally evaluated due to lack of opacification
and adequate distention. Small bowel loops grossly unremarkable.
Stomach decompressed.

Vascular/Lymphatic: Aorta normal caliber. Few normal sized lymph
nodes clustered in fat adjacent to cecum/ascending colon.

Reproductive: N/A

Other: No gross free air or free fluid though portions of pelvis are
obscured. No definite hernia.

Musculoskeletal: BILATERAL hip prostheses. Degenerative facet
disease changes lumbar spine.
IMPRESSION: Suspicious areas of scattered colonic wall thickening raising
question of colitis as discussed above.

No other definite intra-or intrapelvic abnormalities.

## 2019-02-14 NOTE — Progress Notes (Signed)
The patient comes in today to go over CT scan of his right shoulder.  He has had a previous arthroscopic intervention 6 years ago with a subacromial decompression and distal clavicle resection.  Over the years he has had worsening shoulder pain and slight decrease in function of the shoulder.  When I saw him about a month ago we did inject his shoulder and he is felt significant relief for the first time ever with injections.  We still send in for the CT scan of his right shoulder to assess the degree of osteoarthritis in the shoulder.  On exam he does have stiffness in the shoulder and lacks full internal rotation with adduction as well as full overhead reach but I do not feel significant grinding in the shoulder.  However the CT scan does show severe glenohumeral arthritic changes.  There are sclerotic changes in the humeral head and the glenoid.  There are cystic changes in the subchondral bone on both sides.  There are periarticular osteophytes all around the humeral head.  This point he is going to think about the possibility of having a shoulder replacement specialist see him.  All question concerns were answered addressed.  Follow-up as otherwise as needed.  He will let me know if he needs a referral.  We can always repeat injections in his right shoulder this fall if needed.

## 2019-03-07 DIAGNOSIS — Z79899 Other long term (current) drug therapy: Secondary | ICD-10-CM | POA: Diagnosis not present

## 2019-03-07 DIAGNOSIS — Z1159 Encounter for screening for other viral diseases: Secondary | ICD-10-CM | POA: Diagnosis not present

## 2019-03-07 DIAGNOSIS — F411 Generalized anxiety disorder: Secondary | ICD-10-CM | POA: Diagnosis not present

## 2019-03-07 DIAGNOSIS — M545 Low back pain: Secondary | ICD-10-CM | POA: Diagnosis not present

## 2019-03-07 DIAGNOSIS — M5136 Other intervertebral disc degeneration, lumbar region: Secondary | ICD-10-CM | POA: Diagnosis not present

## 2019-03-08 ENCOUNTER — Ambulatory Visit: Payer: BC Managed Care – PPO | Admitting: Orthopaedic Surgery

## 2019-03-29 DIAGNOSIS — L821 Other seborrheic keratosis: Secondary | ICD-10-CM | POA: Diagnosis not present

## 2019-03-29 DIAGNOSIS — L281 Prurigo nodularis: Secondary | ICD-10-CM | POA: Diagnosis not present

## 2019-04-01 DIAGNOSIS — Z79899 Other long term (current) drug therapy: Secondary | ICD-10-CM | POA: Diagnosis not present

## 2019-04-01 DIAGNOSIS — M5136 Other intervertebral disc degeneration, lumbar region: Secondary | ICD-10-CM | POA: Diagnosis not present

## 2019-04-01 DIAGNOSIS — M545 Low back pain: Secondary | ICD-10-CM | POA: Diagnosis not present

## 2019-04-29 DIAGNOSIS — M25511 Pain in right shoulder: Secondary | ICD-10-CM | POA: Diagnosis not present

## 2019-04-29 DIAGNOSIS — F411 Generalized anxiety disorder: Secondary | ICD-10-CM | POA: Diagnosis not present

## 2019-04-29 DIAGNOSIS — M545 Low back pain: Secondary | ICD-10-CM | POA: Diagnosis not present

## 2019-04-29 DIAGNOSIS — M5136 Other intervertebral disc degeneration, lumbar region: Secondary | ICD-10-CM | POA: Diagnosis not present

## 2019-04-29 DIAGNOSIS — Z79899 Other long term (current) drug therapy: Secondary | ICD-10-CM | POA: Diagnosis not present

## 2019-05-31 DIAGNOSIS — F411 Generalized anxiety disorder: Secondary | ICD-10-CM | POA: Diagnosis not present

## 2019-05-31 DIAGNOSIS — Z79899 Other long term (current) drug therapy: Secondary | ICD-10-CM | POA: Diagnosis not present

## 2019-05-31 DIAGNOSIS — M545 Low back pain: Secondary | ICD-10-CM | POA: Diagnosis not present

## 2019-05-31 DIAGNOSIS — M5136 Other intervertebral disc degeneration, lumbar region: Secondary | ICD-10-CM | POA: Diagnosis not present

## 2019-06-24 ENCOUNTER — Encounter (HOSPITAL_COMMUNITY): Payer: Self-pay | Admitting: Emergency Medicine

## 2019-06-24 ENCOUNTER — Emergency Department (HOSPITAL_COMMUNITY)
Admission: EM | Admit: 2019-06-24 | Discharge: 2019-06-25 | Disposition: A | Discharge status: Home / Self Care | Payer: BC Managed Care – PPO | Attending: Emergency Medicine | Admitting: Emergency Medicine

## 2019-06-24 ENCOUNTER — Other Ambulatory Visit: Payer: Self-pay

## 2019-06-24 DIAGNOSIS — R001 Bradycardia, unspecified: Secondary | ICD-10-CM | POA: Diagnosis not present

## 2019-06-24 DIAGNOSIS — Z20828 Contact with and (suspected) exposure to other viral communicable diseases: Secondary | ICD-10-CM | POA: Diagnosis not present

## 2019-06-24 DIAGNOSIS — R519 Headache, unspecified: Secondary | ICD-10-CM | POA: Insufficient documentation

## 2019-06-24 DIAGNOSIS — Z79899 Other long term (current) drug therapy: Secondary | ICD-10-CM | POA: Diagnosis not present

## 2019-06-24 DIAGNOSIS — R531 Weakness: Secondary | ICD-10-CM | POA: Diagnosis not present

## 2019-06-24 DIAGNOSIS — I10 Essential (primary) hypertension: Secondary | ICD-10-CM | POA: Diagnosis not present

## 2019-06-24 DIAGNOSIS — R112 Nausea with vomiting, unspecified: Secondary | ICD-10-CM | POA: Diagnosis not present

## 2019-06-24 DIAGNOSIS — R0902 Hypoxemia: Secondary | ICD-10-CM | POA: Diagnosis not present

## 2019-06-24 DIAGNOSIS — Z209 Contact with and (suspected) exposure to unspecified communicable disease: Secondary | ICD-10-CM | POA: Diagnosis not present

## 2019-06-24 LAB — CBC WITH DIFFERENTIAL/PLATELET
Abs Immature Granulocytes: 0.05 10*3/uL (ref 0.00–0.07)
Basophils Absolute: 0 10*3/uL (ref 0.0–0.1)
Basophils Relative: 0 %
Eosinophils Absolute: 0 10*3/uL (ref 0.0–0.5)
Eosinophils Relative: 0 %
HCT: 42.7 % (ref 39.0–52.0)
Hemoglobin: 14.1 g/dL (ref 13.0–17.0)
Immature Granulocytes: 0 %
Lymphocytes Relative: 8 %
Lymphs Abs: 0.9 10*3/uL (ref 0.7–4.0)
MCH: 28.3 pg (ref 26.0–34.0)
MCHC: 33 g/dL (ref 30.0–36.0)
MCV: 85.7 fL (ref 80.0–100.0)
Monocytes Absolute: 0.6 10*3/uL (ref 0.1–1.0)
Monocytes Relative: 5 %
Neutro Abs: 10.6 10*3/uL — ABNORMAL HIGH (ref 1.7–7.7)
Neutrophils Relative %: 87 %
Platelets: 197 10*3/uL (ref 150–400)
RBC: 4.98 MIL/uL (ref 4.22–5.81)
RDW: 12.6 % (ref 11.5–15.5)
WBC: 12.2 10*3/uL — ABNORMAL HIGH (ref 4.0–10.5)
nRBC: 0 % (ref 0.0–0.2)

## 2019-06-24 LAB — COMPREHENSIVE METABOLIC PANEL
ALT: 13 U/L (ref 0–44)
AST: 27 U/L (ref 15–41)
Albumin: 4.2 g/dL (ref 3.5–5.0)
Alkaline Phosphatase: 94 U/L (ref 38–126)
Anion gap: 12 (ref 5–15)
BUN: 18 mg/dL (ref 8–23)
CO2: 25 mmol/L (ref 22–32)
Calcium: 8.9 mg/dL (ref 8.9–10.3)
Chloride: 101 mmol/L (ref 98–111)
Creatinine, Ser: 0.73 mg/dL (ref 0.61–1.24)
GFR calc Af Amer: >60 mL/min (ref >60)
GFR calc non Af Amer: >60 mL/min (ref >60)
Glucose, Bld: 153 mg/dL — ABNORMAL HIGH (ref 70–99)
Potassium: 3.2 mmol/L — ABNORMAL LOW (ref 3.5–5.1)
Sodium: 138 mmol/L (ref 135–145)
Total Bilirubin: 0.9 mg/dL (ref 0.3–1.2)
Total Protein: 6.8 g/dL (ref 6.5–8.1)

## 2019-06-24 NOTE — ED Triage Notes (Signed)
Pt brought to ED by EMS from home for c/o generalized body aches, nausea, vomiting and chills for the past 4 days, pt report change on taste and smell, denies any contact with COVID-19. BP 150/80, HR 58, SPO2 97% RA, CBGF 200, Temp 97. 4 mg Zofran given by EMS pta and 500 mL NS bolus.

## 2019-06-25 LAB — SARS CORONAVIRUS 2 (TAT 6-24 HRS): SARS Coronavirus 2: NEGATIVE

## 2019-06-25 MED ORDER — SODIUM CHLORIDE 0.9 % IV BOLUS
1000.0000 mL | Freq: Once | INTRAVENOUS | Status: AC
  Administered 2019-06-25: 1000 mL via INTRAVENOUS

## 2019-06-25 MED ORDER — DIPHENHYDRAMINE HCL 50 MG/ML IJ SOLN
12.5000 mg | Freq: Once | INTRAMUSCULAR | Status: AC
  Administered 2019-06-25: 12.5 mg via INTRAVENOUS
  Filled 2019-06-25: qty 1

## 2019-06-25 MED ORDER — ONDANSETRON 4 MG PO TBDP: 4.0000 mg | ORAL_TABLET | Freq: Three times a day (TID) | ORAL | 0 refills | Status: DC | PRN

## 2019-06-25 MED ORDER — HYDROMORPHONE HCL 1 MG/ML IJ SOLN
2.0000 mg | Freq: Once | INTRAMUSCULAR | Status: AC
  Administered 2019-06-25: 2 mg via INTRAVENOUS
  Filled 2019-06-25: qty 2

## 2019-06-25 MED ORDER — METOCLOPRAMIDE HCL 5 MG/ML IJ SOLN
10.0000 mg | Freq: Once | INTRAMUSCULAR | Status: AC
  Administered 2019-06-25: 05:00:00 10 mg via INTRAVENOUS
  Filled 2019-06-25: qty 2

## 2019-06-25 NOTE — Discharge Instructions (Signed)
Use Zofran for nausea as directed. Continue to drink plenty of fluids.   Your COVID test should result in up to 24 hours. You can check for the result in MyChart.   Get plenty of rest. If you still have symptoms of generalized weakness, vomiting, diarrhea, please see your doctor for re-evaluation.

## 2019-06-25 NOTE — ED Provider Notes (Signed)
[facility] EMERGENCY DEPARTMENT Provider Note   CSN: [id number] Arrival date & time: 06/24/19  2208     History   Chief Complaint Chief Complaint  Patient presents with  . Generalized Body Aches    HPI Paul Ray is a 63 y.o. male.     Patient to ED with generalized weakness, chills, nausea, vomiting onset after intense yard work yesterday afternoon. He reports chopping and digging up root systems for 2-3 hours in the afternoon, taking in very little water. He started walking toward the house to rest and states he was too weak to walk so he laid down in the grass. No chest pain, abdominal pain or headache. He became nauseous and vomited several times. He was able to get to the house and to bed where he started having severe chills and continued vomiting. No hematemesis, diarrhea, cough, congestion or body aches.   The history is provided by the patient. No language interpreter was used.    Past Medical History:  Diagnosis Date  . Abscess of lung(513.0)    Right lower lobe  . ADD (attention deficit disorder)   . Anxiety   . Arthritis   . Bipolar depression ([place])   . Empyema lung ([place])   . Hemorrhoids   . Hepatitis C   . Pneumonia    last year  . Prostate abscess   . Prostate troubles     Patient Active Problem List   Diagnosis Date Noted  . Psychoactive substance-induced psychosis ([place])   . Rib pain 07/01/2017  . Closed fracture of one rib of left side 07/01/2017  . Chronic right shoulder pain 05/04/2017  . Impingement syndrome of right shoulder 07/05/2013  . Empyema lung ([place])   . Abscess of lung(513.0)   . Bipolar depression ([place])   . ADD (attention deficit disorder)   . Hepatitis C   . Lung mass 10/22/2010  . Pleural effusion 10/15/2010    Past Surgical History:  Procedure Laterality Date  . ELBOW SURGERY  2005   Right  . FOOT NEUROMA SURGERY  2006   Left  . HIP RESURFACING     left hip at [facility] 01/2011  . LUMBAR  LAMINECTOMY/DECOMPRESSION MICRODISCECTOMY  07/24/2011   Procedure: LUMBAR LAMINECTOMY/DECOMPRESSION MICRODISCECTOMY;  Surgeon: Eustace Moore;  Location: [place] [facility];  Service: Neurosurgery;  Laterality: Bilateral;  Bilateral  , Lumbar Three-Four, Lumbar Four-Five Decompressive Laminectomy Rm # 32  . LUNG SURGERY     for empyema  . SHOULDER ARTHROSCOPY Right 07/05/2013   Procedure: RIGHT SHOULDER ARTHROSCOPY WITH DEBRIDEMENT;  Surgeon: Mcarthur Rossetti, MD;  Location: [place];  Service: Orthopedics;  Laterality: Right;  . TENDON TRANSFER Left 01/26/2014   Procedure: LEFT THUMB TRAPEZIECTOMY KNOTTED TENDON INTRAPOSITION TRANSFER OF ABDUCTOR POLLICIS LONGUS TO [place];  Surgeon: Cammie Sickle, MD;  Location: [place];  Service: Orthopedics;  Laterality: Left;  . THORACOTOMY  November 14 2010   rt        Home Medications    Prior to Admission medications   Medication Sig Start Date End Date Taking? Authorizing Provider  alprazolam Duanne Moron) 2 MG tablet Take 2 mg by mouth 2 (two) times daily as needed for anxiety. 10/28/16   [provider]  amphetamine-dextroamphetamine (ADDERALL XR) 30 MG 24 hr capsule Take 60 mg by mouth every morning.    [provider]  amphetamine-dextroamphetamine (ADDERALL) 20 MG tablet Take 20 mg by mouth daily. 08/13/16   [provider]  doxycycline (VIBRA-TABS) 100 MG tablet Take 1 tablet (100 mg total) by mouth 2 (two) times daily. 07/07/18   Mcarthur Rossetti, MD  FLUoxetine HCl (PROZAC PO) Take 20 mg by mouth daily.     [provider]  Glucosamine HCl-MSM (GLUCOSAMINE-MSM PO) Take 1 tablet by mouth every morning. 1500mg  of each drug    [provider]  HYDROmorphone (DILAUDID) 4 MG tablet Take 4 mg by mouth every 4 (four) hours.  08/14/16   [provider]  lamoTRIgine (LAMICTAL) 100 MG tablet Take 100 mg by mouth daily.     [provider]  lidocaine (XYLOCAINE) 2 % solution  Use as directed 5-10 mLs in the mouth or throat as needed for mouth pain. 07/04/18   Tereasa Coop, PA-C  MAGNESIUM PO Take 1 tablet by mouth daily.    [provider]  morphine (MS CONTIN) 30 MG 12 hr tablet Take 60 mg by mouth 2 (two) times daily.  08/14/16   [provider]  Multiple Vitamin (MULTIVITAMIN) capsule Take 1 capsule by mouth daily.      [provider]  OLANZapine (ZYPREXA PO) Take 10 mg by mouth daily.     [provider]  Tamsulosin HCl (FLOMAX) 0.4 MG CAPS Take 0.8 mg by mouth daily.     [provider]  traMADol (ULTRAM) 50 MG tablet Take 1-2 tablets (50-100 mg total) by mouth every 6 (six) hours as needed. 07/07/18   Mcarthur Rossetti, MD    Family History Family History  Problem Relation Age of Onset  . COPD Mother   . Heart disease Mother   . Hypertension Mother   . Coronary artery disease Father   . Dementia Father   . Heart failure Father   . Prostate cancer Paternal Grandfather        also had bone cancer  . Breast cancer Maternal Grandmother   . Cancer Paternal Grandmother        unsure what kind    Social History Social History   Tobacco Use  . Smoking status: Never Smoker  . Smokeless tobacco: Never Used  Substance Use Topics  . Alcohol use: No    Comment: quit drinking in 1989  . Drug use: No     Allergies   Patient has no known allergies.   Review of Systems Review of Systems  Constitutional: Positive for chills. Negative for fever.  HENT: Negative.  Negative for congestion.   Respiratory: Negative.  Negative for cough and shortness of breath.   Cardiovascular: Negative.  Negative for chest pain.  Gastrointestinal: Positive for nausea and vomiting. Negative for abdominal pain and diarrhea.  Genitourinary: Negative for decreased urine volume.  Musculoskeletal: Negative.   Skin: Negative.   Neurological: Positive for weakness and light-headedness. Negative for syncope.     Physical  Exam Updated Vital Signs BP 131/74 (BP Location: Left Arm)   Pulse 63   Temp 97.8 F (36.6 C) (Oral)   Resp 18   Ht 5\' 7"  (1.702 m)   Wt 77.1 kg   SpO2 100%   BMI 26.63 kg/m   Physical Exam Vitals signs and nursing note reviewed.  Constitutional:      Appearance: He is well-developed.  HENT:     Head: Normocephalic and atraumatic.     Mouth/Throat:     Mouth: Mucous membranes are dry.  Neck:     Musculoskeletal: Normal range of motion and neck supple.  Cardiovascular:  Rate and Rhythm: Normal rate and regular rhythm.  Pulmonary:     Effort: Pulmonary effort is normal.     Breath sounds: Normal breath sounds. No wheezing, rhonchi or rales.  Chest:     Chest wall: No tenderness.  Abdominal:     General: Bowel sounds are normal.     Palpations: Abdomen is soft.     Tenderness: There is no abdominal tenderness. There is no guarding or rebound.  Musculoskeletal: Normal range of motion.  Skin:    General: Skin is warm and dry.  Neurological:     Mental Status: He is alert and oriented to person, place, and time.     Sensory: No sensory deficit.     Motor: No weakness.     Coordination: Coordination normal.     Comments: CN's 3-12 grossly intact. Speech is clear and focused. No facial asymmetry. No lateralizing weakness. No deficits of coordination. Ambulatory without imbalance.        ED Treatments / Results  Labs (all labs ordered are listed, but only abnormal results are displayed) Labs Reviewed  CBC WITH DIFFERENTIAL/PLATELET - Abnormal; Notable for the following components:      Result Value   WBC 12.2 (*)    Neutro Abs 10.6 (*)    All other components within normal limits  COMPREHENSIVE METABOLIC PANEL - Abnormal; Notable for the following components:   Potassium 3.2 (*)    Glucose, Bld 153 (*)    All other components within normal limits  SARS CORONAVIRUS 2 (TAT 6-24 HRS)  URINALYSIS, ROUTINE W REFLEX MICROSCOPIC    EKG None  Radiology No  results found.  Procedures Procedures (including critical care time)  Medications Ordered in ED Medications  sodium chloride 0.9 % bolus 1,000 mL (has no administration in time range)  HYDROmorphone (DILAUDID) injection 2 mg (has no administration in time range)     Initial Impression / Assessment and Plan / ED Course  I have reviewed the triage vital signs and the nursing notes.  Pertinent labs & imaging results that were available during my care of the patient were reviewed by me and considered in my medical decision making (see chart for details).        Patient to ED after heavy physical yard work for several hours yesterday, with subsequent onset chills, N, V, generalized weakness, fatigue.   He is overall well appearing. No tachycardia. Urine appears dark/concentrated. Suspect some degree of dehydration. IVF's ordered. He is on chronic pain management and states he is due for his regular dose. IV Dilaudid provided.   On recheck, the patient complains of headache. Reglan, Benadryl given for headache. Patient sleeping on recheck.   He is felt appropriate for discharge home. Rx Zofran, rest, PCP follow up.  Final Clinical Impressions(s) / ED Diagnoses   Final diagnoses:  None   1. Nausea and vomiting 2. Weakness  ED Discharge Orders    None       Charlann Lange, PA-C 06/25/19 0539    Merryl Hacker, MD 06/25/19 [phone]

## 2019-06-28 DIAGNOSIS — Z79899 Other long term (current) drug therapy: Secondary | ICD-10-CM | POA: Diagnosis not present

## 2019-06-28 DIAGNOSIS — M545 Low back pain: Secondary | ICD-10-CM | POA: Diagnosis not present

## 2019-06-29 ENCOUNTER — Ambulatory Visit: Payer: BC Managed Care – PPO | Admitting: Orthopaedic Surgery

## 2019-07-04 DIAGNOSIS — F6381 Intermittent explosive disorder: Secondary | ICD-10-CM | POA: Diagnosis not present

## 2019-07-04 DIAGNOSIS — F3181 Bipolar II disorder: Secondary | ICD-10-CM | POA: Diagnosis not present

## 2019-07-04 DIAGNOSIS — F900 Attention-deficit hyperactivity disorder, predominantly inattentive type: Secondary | ICD-10-CM | POA: Diagnosis not present

## 2019-07-04 DIAGNOSIS — F410 Panic disorder [episodic paroxysmal anxiety] without agoraphobia: Secondary | ICD-10-CM | POA: Diagnosis not present

## 2019-07-06 ENCOUNTER — Other Ambulatory Visit: Payer: Self-pay

## 2019-07-06 ENCOUNTER — Encounter: Payer: Self-pay | Admitting: Orthopaedic Surgery

## 2019-07-06 ENCOUNTER — Ambulatory Visit (INDEPENDENT_AMBULATORY_CARE_PROVIDER_SITE_OTHER): Payer: BC Managed Care – PPO | Admitting: Orthopaedic Surgery

## 2019-07-06 DIAGNOSIS — G8929 Other chronic pain: Secondary | ICD-10-CM

## 2019-07-06 DIAGNOSIS — M25511 Pain in right shoulder: Secondary | ICD-10-CM | POA: Diagnosis not present

## 2019-07-06 MED ORDER — LIDOCAINE HCL 1 % IJ SOLN
3.0000 mL | INTRAMUSCULAR | Status: AC | PRN
  Administered 2019-07-06: 3 mL

## 2019-07-06 MED ORDER — METHYLPREDNISOLONE ACETATE 40 MG/ML IJ SUSP
40.0000 mg | INTRAMUSCULAR | Status: AC | PRN
  Administered 2019-07-06: 40 mg via INTRA_ARTICULAR

## 2019-07-06 NOTE — Progress Notes (Signed)
Office Visit Note   Patient: Paul Ray           Date of Birth: 02-16-1956           MRN: [id number] Visit Date: 07/06/2019              Requested by: Shirline Frees, MD [address] [place],  [place] [address] PCP: Shirline Frees, MD   Assessment & Plan: Visit Diagnoses:  1. Chronic right shoulder pain     Plan: Per his wish we did provide a steroid injection in his right shoulder without difficulty.  All question concerns were answered and addressed.  Follow-up will be as needed.  Follow-Up Instructions: Return if symptoms worsen or fail to improve.   Orders:  Orders Placed This Encounter  Procedures  . Large Joint Inj   No orders of the defined types were placed in this encounter.     Procedures: Large Joint Inj: R subacromial bursa on 07/06/2019 10:56 AM Indications: pain and diagnostic evaluation Details: 22 G 1.5 in needle  Arthrogram: No  Medications: 3 mL lidocaine 1 %; 40 mg methylPREDNISolone acetate 40 MG/ML Outcome: tolerated well, no immediate complications Procedure, treatment alternatives, risks and benefits explained, specific risks discussed. Consent was given by the patient. Immediately prior to procedure a time out was called to verify the correct patient, procedure, equipment, support staff and site/side marked as required. Patient was prepped and draped in the usual sterile fashion.       Clinical Data: No additional findings.   Subjective: Chief Complaint  Patient presents with  . Right Shoulder - Pain  The patient comes in today with an acute flareup of pain in his right shoulder.  He does deal with chronic pain in the right shoulder and has known glenohumeral arthritic changes in the shoulder.  We did perform an arthroscopic intervention about 6 years ago.  We last injected his shoulder on the right side about 6 months ago.  He does a lot of heavy manual labor and is an acute flareup of pain in that shoulder itself.  He is  requesting injection today.  HPI  Review of Systems He currently denies any headache, chest pain, shortness of breath, fever, chills, nausea, vomiting  Objective: Vital Signs: There were no vitals taken for this visit.  Physical Exam He is alert and orient x3 and in no acute distress Ortho Exam Examination of his right shoulder shows is clinically well located with full range of motion just global tenderness.  There is no significant weakness. Specialty Comments:  No specialty comments available.  Imaging: No results found.   PMFS History: Patient Active Problem List   Diagnosis Date Noted  . Psychoactive substance-induced psychosis ([place])   . Rib pain 07/01/2017  . Closed fracture of one rib of left side 07/01/2017  . Chronic right shoulder pain 05/04/2017  . Impingement syndrome of right shoulder 07/05/2013  . Empyema lung ([place])   . Abscess of lung(513.0)   . Bipolar depression ([place])   . ADD (attention deficit disorder)   . Hepatitis C   . Lung mass 10/22/2010  . Pleural effusion 10/15/2010   Past Medical History:  Diagnosis Date  . Abscess of lung(513.0)    Right lower lobe  . ADD (attention deficit disorder)   . Anxiety   . Arthritis   . Bipolar depression ([place])   . Empyema lung ([place])   . Hemorrhoids   . Hepatitis C   .  Pneumonia    last year  . Prostate abscess   . Prostate troubles     Family History  Problem Relation Age of Onset  . COPD Mother   . Heart disease Mother   . Hypertension Mother   . Coronary artery disease Father   . Dementia Father   . Heart failure Father   . Prostate cancer Paternal Grandfather        also had bone cancer  . Breast cancer Maternal Grandmother   . Cancer Paternal Grandmother        unsure what kind    Past Surgical History:  Procedure Laterality Date  . ELBOW SURGERY  2005   Right  . FOOT NEUROMA SURGERY  2006   Left  . HIP RESURFACING     [facility] hip [facility] 01/2011  . LUMBAR LAMINECTOMY/DECOMPRESSION  MICRODISCECTOMY  07/24/2011   Procedure: LUMBAR LAMINECTOMY/DECOMPRESSION MICRODISCECTOMY;  Surgeon: Eustace Moore;  Location: [place] [facility];  Service: Neurosurgery;  Laterality: Bilateral;  Bilateral  , Lumbar Three-Four, Lumbar Four-Five Decompressive Laminectomy Rm # 32  . LUNG SURGERY     for empyema  . SHOULDER ARTHROSCOPY Right 07/05/2013   Procedure: RIGHT SHOULDER ARTHROSCOPY WITH DEBRIDEMENT;  Surgeon: Mcarthur Rossetti, MD;  Location: [place];  Service: Orthopedics;  Laterality: Right;  . TENDON TRANSFER Left 01/26/2014   Procedure: LEFT THUMB TRAPEZIECTOMY KNOTTED TENDON INTRAPOSITION TRANSFER OF ABDUCTOR POLLICIS LONGUS TO [place];  Surgeon: Cammie Sickle, MD;  Location: [place];  Service: Orthopedics;  Laterality: Left;  . THORACOTOMY  November 14 2010   rt   Social History   Occupational History  . Occupation: Games developer  Tobacco Use  . Smoking status: Never Smoker  . Smokeless tobacco: Never Used  Substance and Sexual Activity  . Alcohol use: No    Comment: quit drinking in 1989  . Drug use: No  . Sexual activity: Not on file

## 2019-07-12 DIAGNOSIS — Z0000 Encounter for general adult medical examination without abnormal findings: Secondary | ICD-10-CM | POA: Diagnosis not present

## 2019-07-12 DIAGNOSIS — R350 Frequency of micturition: Secondary | ICD-10-CM | POA: Diagnosis not present

## 2019-07-12 DIAGNOSIS — Z8249 Family history of ischemic heart disease and other diseases of the circulatory system: Secondary | ICD-10-CM | POA: Diagnosis not present

## 2019-07-12 DIAGNOSIS — Z125 Encounter for screening for malignant neoplasm of prostate: Secondary | ICD-10-CM | POA: Diagnosis not present

## 2019-07-12 DIAGNOSIS — Z13220 Encounter for screening for lipoid disorders: Secondary | ICD-10-CM | POA: Diagnosis not present

## 2019-07-12 DIAGNOSIS — E876 Hypokalemia: Secondary | ICD-10-CM | POA: Diagnosis not present

## 2019-07-12 DIAGNOSIS — F3175 Bipolar disorder, in partial remission, most recent episode depressed: Secondary | ICD-10-CM | POA: Diagnosis not present

## 2019-07-15 DIAGNOSIS — M545 Low back pain: Secondary | ICD-10-CM | POA: Diagnosis not present

## 2019-07-15 DIAGNOSIS — M25511 Pain in right shoulder: Secondary | ICD-10-CM | POA: Diagnosis not present

## 2019-07-15 DIAGNOSIS — Z79899 Other long term (current) drug therapy: Secondary | ICD-10-CM | POA: Diagnosis not present

## 2019-07-15 DIAGNOSIS — M5136 Other intervertebral disc degeneration, lumbar region: Secondary | ICD-10-CM | POA: Diagnosis not present

## 2019-08-08 ENCOUNTER — Ambulatory Visit: Payer: BC Managed Care – PPO | Admitting: Physician Assistant

## 2019-08-09 ENCOUNTER — Encounter: Payer: Self-pay | Admitting: Family Medicine

## 2019-08-09 ENCOUNTER — Ambulatory Visit: Payer: Self-pay

## 2019-08-09 ENCOUNTER — Ambulatory Visit: Payer: BC Managed Care – PPO | Admitting: Family Medicine

## 2019-08-09 ENCOUNTER — Other Ambulatory Visit: Payer: Self-pay

## 2019-08-09 DIAGNOSIS — M79644 Pain in right finger(s): Secondary | ICD-10-CM | POA: Diagnosis not present

## 2019-08-09 MED ORDER — DOXYCYCLINE HYCLATE 100 MG PO CAPS: 100.0000 mg | ORAL_CAPSULE | Freq: Two times a day (BID) | ORAL | 0 refills | Status: DC

## 2019-08-09 NOTE — Progress Notes (Signed)
Office Visit Note   Patient: Paul Ray           Date of Birth: 03-Feb-1956           MRN: [id number] Visit Date: 08/09/2019 Requested by: Shirline Frees, MD [address] [place],  [place] [address] PCP: Shirline Frees, MD  Subjective: Chief Complaint  Patient presents with  . Right Thumb - Pain    Pain and swelling x 4 days. NKI.    HPI: He is here with right thumb pain.  Symptoms started about 4 days ago, no definite injury.  He does a lot of work with his hands at home and is constantly sustaining minor injuries.  This time he started noticing dorsal thumb pain at the IP joint along with some swelling.  He is on chronic pain medicines but it does not seem to be making much difference with his thumb pain.  No history of gout.  Denies fevers or chills.  He has not had any drainage from his thumb.              ROS:   All other systems were reviewed and are negative.  Objective: Vital Signs: There were no vitals taken for this visit.  Physical Exam:  General:  Alert and oriented, in no acute distress. Pulm:  Breathing unlabored. Psy:  Normal mood, congruent affect. Skin: There is slight erythema on the dorsum of his right thumb just proximal to the nail.  It is tender to touch, no drainage expressible. Right thumb: He is able to flex and extend the IP joint with intact tendon function.  No pain with medial/lateral squeeze of the distal phalanx.  No laxity of the collateral ligaments.  Imaging: X-rays right thumb: Moderate degenerative joint disease at the MCP joint with subchondral cystic changes at the distal first metacarpal.  Mild to moderate IP joint DJD.  No sign of fracture.  He has second MCP DJD with spurring of the metacarpal head.    Assessment & Plan: 1.  Right thumb pain and swelling, question paronychia -Discussed options with patient, elected to treat with doxycycline, warm compresses.  We will have a low threshold to have him come back in for  incision and drainage if symptoms worsen.     Procedures: No procedures performed  No notes on file     PMFS History: Patient Active Problem List   Diagnosis Date Noted  . Psychoactive substance-induced psychosis ([place])   . Rib pain 07/01/2017  . Closed fracture of one rib of left side 07/01/2017  . Chronic right shoulder pain 05/04/2017  . Osteoarthritis of right hip 12/02/2016  . Impingement syndrome of right shoulder 07/05/2013  . Empyema lung ([place])   . Abscess of lung(513.0)   . Bipolar depression ([place])   . ADD (attention deficit disorder)   . Hepatitis C   . Hip pain 05/02/2011  . History of total hip arthroplasty 04/23/2011  . Lung mass 10/22/2010  . Pleural effusion 10/15/2010   Past Medical History:  Diagnosis Date  . Abscess of lung(513.0)    Right lower lobe  . ADD (attention deficit disorder)   . Anxiety   . Arthritis   . Bipolar depression ([place])   . Empyema lung ([place])   . Hemorrhoids   . Hepatitis C   . Pneumonia    last year  . Prostate abscess   . Prostate troubles     Family History  Problem Relation Age of Onset  .  COPD Mother   . Heart disease Mother   . Hypertension Mother   . Coronary artery disease Father   . Dementia Father   . Heart failure Father   . Prostate cancer Paternal Grandfather        also had bone cancer  . Breast cancer Maternal Grandmother   . Cancer Paternal Grandmother        unsure what kind    Past Surgical History:  Procedure Laterality Date  . ELBOW SURGERY  2005   Right  . FOOT NEUROMA SURGERY  2006   Left  . HIP RESURFACING     [facility] hip at [facility] 01/2011  . LUMBAR LAMINECTOMY/DECOMPRESSION MICRODISCECTOMY  07/24/2011   Procedure: LUMBAR LAMINECTOMY/DECOMPRESSION MICRODISCECTOMY;  Surgeon: Eustace Moore;  Location: [place] [facility];  Service: Neurosurgery;  Laterality: Bilateral;  Bilateral  , Lumbar Three-Four, Lumbar Four-Five Decompressive Laminectomy Rm # 32  . LUNG SURGERY     for empyema  . SHOULDER  ARTHROSCOPY Right 07/05/2013   Procedure: RIGHT SHOULDER ARTHROSCOPY WITH DEBRIDEMENT;  Surgeon: Mcarthur Rossetti, MD;  Location: [place];  Service: Orthopedics;  Laterality: Right;  . TENDON TRANSFER Left 01/26/2014   Procedure: LEFT THUMB TRAPEZIECTOMY KNOTTED TENDON INTRAPOSITION TRANSFER OF ABDUCTOR POLLICIS LONGUS TO [place];  Surgeon: Cammie Sickle, MD;  Location: [place];  Service: Orthopedics;  Laterality: Left;  . THORACOTOMY  November 14 2010   rt   Social History   Occupational History  . Occupation: Games developer  Tobacco Use  . Smoking status: Never Smoker  . Smokeless tobacco: Never Used  Substance and Sexual Activity  . Alcohol use: No    Comment: quit drinking in 1989  . Drug use: No  . Sexual activity: Not on file

## 2019-08-10 ENCOUNTER — Ambulatory Visit (INDEPENDENT_AMBULATORY_CARE_PROVIDER_SITE_OTHER): Payer: BC Managed Care – PPO | Admitting: Family Medicine

## 2019-08-10 ENCOUNTER — Encounter: Payer: Self-pay | Admitting: Family Medicine

## 2019-08-10 ENCOUNTER — Telehealth: Payer: Self-pay

## 2019-08-10 DIAGNOSIS — M79644 Pain in right finger(s): Secondary | ICD-10-CM

## 2019-08-10 MED ORDER — MUPIROCIN 2 % EX OINT: 1.0000 "application " | TOPICAL_OINTMENT | Freq: Two times a day (BID) | CUTANEOUS | 0 refills | Status: DC

## 2019-08-10 MED ORDER — CLINDAMYCIN HCL 300 MG PO CAPS: 300.0000 mg | ORAL_CAPSULE | Freq: Three times a day (TID) | ORAL | 0 refills | Status: DC

## 2019-08-10 NOTE — Telephone Encounter (Signed)
Patient has appointment for this afternoon with Dr. Junius Roads.

## 2019-08-10 NOTE — Telephone Encounter (Signed)
Please advise 

## 2019-08-10 NOTE — Patient Instructions (Signed)
    Soak thumb in warm soapy water (Dial soap, etc.) for 10-15 minutes, 2-3 times daily.  Use Bactroban ointment on thumb after soaking, and apply bandaid or bandage.  Switch to clindamycin instead of doxycycline.  Return in a week to see Dr. Ninfa Linden, or go to the ER sooner if it gets even worse.

## 2019-08-10 NOTE — Telephone Encounter (Signed)
Might need to come in and see if we can get it to drain.

## 2019-08-10 NOTE — Progress Notes (Signed)
Office Visit Note   Patient: Paul Ray           Date of Birth: 10-08-1955           MRN: [id number] Visit Date: 08/10/2019 Requested by: Shirline Frees, MD [address] [place],  [place] [address] PCP: Shirline Frees, MD  Subjective: Chief Complaint  Patient presents with  . Right Thumb - Pain    Worsened pain in the thumb.    HPI: He is here with worsening right thumb pain.  He is on doxycycline but has not noticed much improvement in his symptoms, in fact his pain has become intensely worse.  He could not get any sleep because of his pain.               ROS: No fevers or chills.  All other systems were reviewed and are negative.  Objective: Vital Signs: There were no vitals taken for this visit.  Physical Exam:  General:  Alert and oriented, in no acute distress. Pulm:  Breathing unlabored. Psy:  Normal mood, congruent affect. Skin: Slight increase in erythema and swelling at the thumb distal phalanx on the dorsum just proximal to the nail.  No drainage expressible.   Imaging: None today  Assessment & Plan: 1.  Worsening right thumb pain and swelling, suspect paronychia -Discussed options with him and elected to perform a digital block and incision and drainage.  After sterile prep with Betadine, injected a total of 4 cc 1% lidocaine without epinephrine, 2 on each side of the thumb near the MCP joint.  Then using a 15 blade I made a small incision near the nail but no pus was expressible.  It was too painful for the patient to continue. -I discussed his case with Dr. Ninfa Linden and we elected to switch to clindamycin and Bactroban, with warm water soaks 2 or 3 times a day.  He will follow-up with Dr. Ninfa Linden in a week but go to the ER sooner if pain becomes worse.     Procedures: No procedures performed  No notes on file     PMFS History: Patient Active Problem List   Diagnosis Date Noted  . Psychoactive substance-induced psychosis ([place])   .  Rib pain 07/01/2017  . Closed fracture of one rib of left side 07/01/2017  . Chronic right shoulder pain 05/04/2017  . Osteoarthritis of right hip 12/02/2016  . Impingement syndrome of right shoulder 07/05/2013  . Empyema lung ([place])   . Abscess of lung(513.0)   . Bipolar depression ([place])   . ADD (attention deficit disorder)   . Hepatitis C   . Hip pain 05/02/2011  . History of total hip arthroplasty 04/23/2011  . Lung mass 10/22/2010  . Pleural effusion 10/15/2010   Past Medical History:  Diagnosis Date  . Abscess of lung(513.0)    Right lower lobe  . ADD (attention deficit disorder)   . Anxiety   . Arthritis   . Bipolar depression ([place])   . Empyema lung ([place])   . Hemorrhoids   . Hepatitis C   . Pneumonia    last year  . Prostate abscess   . Prostate troubles     Family History  Problem Relation Age of Onset  . COPD Mother   . Heart disease Mother   . Hypertension Mother   . Coronary artery disease Father   . Dementia Father   . Heart failure Father   . Prostate cancer Paternal Grandfather  also had bone cancer  . Breast cancer Maternal Grandmother   . Cancer Paternal Grandmother        unsure what kind    Past Surgical History:  Procedure Laterality Date  . ELBOW SURGERY  2005   Right  . FOOT NEUROMA SURGERY  2006   Left  . HIP RESURFACING     left hip at [facility] 01/2011  . LUMBAR LAMINECTOMY/DECOMPRESSION MICRODISCECTOMY  07/24/2011   Procedure: LUMBAR LAMINECTOMY/DECOMPRESSION MICRODISCECTOMY;  Surgeon: Eustace Moore;  Location: [place] [facility];  Service: Neurosurgery;  Laterality: Bilateral;  Bilateral  , Lumbar Three-Four, Lumbar Four-Five Decompressive Laminectomy Rm # 32  . LUNG SURGERY     for empyema  . SHOULDER ARTHROSCOPY Right 07/05/2013   Procedure: RIGHT SHOULDER ARTHROSCOPY WITH DEBRIDEMENT;  Surgeon: Mcarthur Rossetti, MD;  Location: [place];  Service: Orthopedics;  Laterality: Right;  . TENDON TRANSFER Left 01/26/2014   Procedure: LEFT  THUMB TRAPEZIECTOMY KNOTTED TENDON INTRAPOSITION TRANSFER OF ABDUCTOR POLLICIS LONGUS TO [place];  Surgeon: Cammie Sickle, MD;  Location: [place];  Service: Orthopedics;  Laterality: Left;  . THORACOTOMY  November 14 2010   rt   Social History   Occupational History  . Occupation: Games developer  Tobacco Use  . Smoking status: Never Smoker  . Smokeless tobacco: Never Used  Substance and Sexual Activity  . Alcohol use: No    Comment: quit drinking in 1989  . Drug use: No  . Sexual activity: Not on file

## 2019-08-10 NOTE — Telephone Encounter (Signed)
Patient called stating that he had some right thumb pain last night, but pain is worse this morning after using his hand to do some work.  Stated that right thumb is swollen and is hurting.  Cb# [phone].  Please advise.  Thank you.

## 2019-08-12 ENCOUNTER — Encounter (HOSPITAL_COMMUNITY): Payer: Self-pay | Admitting: Emergency Medicine

## 2019-08-12 ENCOUNTER — Emergency Department (HOSPITAL_COMMUNITY)
Admission: EM | Admit: 2019-08-12 | Discharge: 2019-08-12 | Disposition: A | Discharge status: Home / Self Care | Payer: BC Managed Care – PPO | Attending: Emergency Medicine | Admitting: Emergency Medicine

## 2019-08-12 ENCOUNTER — Other Ambulatory Visit: Payer: Self-pay

## 2019-08-12 DIAGNOSIS — Z96642 Presence of left artificial hip joint: Secondary | ICD-10-CM | POA: Diagnosis not present

## 2019-08-12 DIAGNOSIS — L03011 Cellulitis of right finger: Secondary | ICD-10-CM

## 2019-08-12 DIAGNOSIS — M79644 Pain in right finger(s): Secondary | ICD-10-CM | POA: Diagnosis present

## 2019-08-12 DIAGNOSIS — Z79899 Other long term (current) drug therapy: Secondary | ICD-10-CM | POA: Insufficient documentation

## 2019-08-12 MED ORDER — LIDOCAINE HCL (PF) 1 % IJ SOLN
5.0000 mL | Freq: Once | INTRAMUSCULAR | Status: AC
  Administered 2019-08-12: 5 mL via INTRADERMAL
  Filled 2019-08-12: qty 5

## 2019-08-12 MED ORDER — HYDROCODONE-ACETAMINOPHEN 5-325 MG PO TABS
1.0000 | ORAL_TABLET | Freq: Once | ORAL | Status: DC
  Filled 2019-08-12: qty 1

## 2019-08-12 NOTE — Discharge Instructions (Addendum)
Continue taking your clindamycin as directed.

## 2019-08-12 NOTE — ED Triage Notes (Signed)
Patient here with right thumb pain.  He states that he saw his MD on Tues and had his finger lanced.  It is now twice the size it normally is, throbbing and very painful.  He states that he has been unable to sleep due to the pain.  Patient is requesting to see a MD, not a PA.

## 2019-08-12 NOTE — ED Provider Notes (Signed)
[facility] EMERGENCY DEPARTMENT Provider Note  CSN: [id number] Arrival date & time: 08/12/19 0149  Chief Complaint(s) Hand Pain  HPI Paul Ray is a 64 y.o. male who presents to the emergency department after worsening right thumb pain.  Patient has had paronychia for the past 7 days.  Seen by Ortho care and initially placed on doxycycline.  He was transitioned to clindamycin 3 days ago.  He also had I&D of a paronychia 3 days ago.  He reports that the swelling has continued to worsen and still has the pain.  Pain is throbbing in nature.  Severe.  Worse with palpation.  No alleviating factors.  Denies any other physical complaints.  HPI  Past Medical History Past Medical History:  Diagnosis Date  . Abscess of lung(513.0)    Right lower lobe  . ADD (attention deficit disorder)   . Anxiety   . Arthritis   . Bipolar depression ([place])   . Empyema lung ([place])   . Hemorrhoids   . Hepatitis C   . Pneumonia    last year  . Prostate abscess   . Prostate troubles    Patient Active Problem List   Diagnosis Date Noted  . Psychoactive substance-induced psychosis ([place])   . Rib pain 07/01/2017  . Closed fracture of one rib of left side 07/01/2017  . Chronic right shoulder pain 05/04/2017  . Osteoarthritis of right hip 12/02/2016  . Impingement syndrome of right shoulder 07/05/2013  . Empyema lung ([place])   . Abscess of lung(513.0)   . Bipolar depression ([place])   . ADD (attention deficit disorder)   . Hepatitis C   . Hip pain 05/02/2011  . History of total hip arthroplasty 04/23/2011  . Lung mass 10/22/2010  . Pleural effusion 10/15/2010   Home Medication(s) Prior to Admission medications   Medication Sig Start Date End Date Taking? Authorizing Provider  alprazolam Duanne Moron) 2 MG tablet Take 2 mg by mouth 2 (two) times daily as needed for anxiety. 10/28/16   [provider]  amphetamine-dextroamphetamine (ADDERALL XR) 30 MG 24 hr capsule Take 60 mg by mouth  daily.     [provider]  clindamycin (CLEOCIN) 300 MG capsule Take 1 capsule (300 mg total) by mouth 3 (three) times daily. 08/10/19   Hilts, Legrand Como, MD  doxycycline (VIBRAMYCIN) 100 MG capsule Take 1 capsule (100 mg total) by mouth 2 (two) times daily. 08/09/19   Hilts, Legrand Como, MD  FLUoxetine (PROZAC) 20 MG capsule Take 20 mg by mouth daily. 04/20/19   [provider]  HYDROmorphone (DILAUDID) 4 MG tablet Take 4 mg by mouth every 4 (four) hours.  08/14/16   [provider]  lamoTRIgine (LAMICTAL) 200 MG tablet Take 200 mg by mouth at bedtime. 01/15/19   [provider]  MAGNESIUM PO Take 1 tablet by mouth daily.    [provider]  morphine (MS CONTIN) 30 MG 12 hr tablet Take 60 mg by mouth 2 (two) times daily.  08/14/16   [provider]  mupirocin ointment (BACTROBAN) 2 % Place 1 application into the nose 2 (two) times daily. 08/10/19   Hilts, Legrand Como, MD  Past Surgical History Past Surgical History:  Procedure Laterality Date  . ELBOW SURGERY  2005   Right  . FOOT NEUROMA SURGERY  2006   Left  . HIP RESURFACING     [facility] hip [facility] 01/2011  . LUMBAR LAMINECTOMY/DECOMPRESSION MICRODISCECTOMY  07/24/2011   Procedure: LUMBAR LAMINECTOMY/DECOMPRESSION MICRODISCECTOMY;  Surgeon: Eustace Moore;  Location: [place] NEURO ORS;  Service: Neurosurgery;  Laterality: Bilateral;  Bilateral  , Lumbar Three-Four, Lumbar Four-Five Decompressive Laminectomy Rm # 32  . LUNG SURGERY     for empyema  . SHOULDER ARTHROSCOPY Right 07/05/2013   Procedure: RIGHT SHOULDER ARTHROSCOPY WITH DEBRIDEMENT;  Surgeon: Mcarthur Rossetti, MD;  Location: [place];  Service: Orthopedics;  Laterality: Right;  . TENDON TRANSFER Left 01/26/2014   Procedure: LEFT THUMB TRAPEZIECTOMY KNOTTED TENDON INTRAPOSITION TRANSFER OF ABDUCTOR POLLICIS LONGUS TO  [place];  Surgeon: Cammie Sickle, MD;  Location: [place];  Service: Orthopedics;  Laterality: Left;  . THORACOTOMY  November 14 2010   rt   Family History Family History  Problem Relation Age of Onset  . COPD Mother   . Heart disease Mother   . Hypertension Mother   . Coronary artery disease Father   . Dementia Father   . Heart failure Father   . Prostate cancer Paternal Grandfather        also had bone cancer  . Breast cancer Maternal Grandmother   . Cancer Paternal Grandmother        unsure what kind    Social History Social History   Tobacco Use  . Smoking status: Never Smoker  . Smokeless tobacco: Never Used  Substance Use Topics  . Alcohol use: No    Comment: quit drinking in 1989  . Drug use: No   Allergies Patient has no known allergies.  Review of Systems Review of Systems All other systems are reviewed and are negative for acute change except as noted in the HPI  Physical Exam Vital Signs  I have reviewed the triage vital signs BP 139/77   Pulse 70   Temp 98.5 F (36.9 C) (Oral)   Resp 18   SpO2 97%   Physical Exam Vitals reviewed.  Constitutional:      General: He is not in acute distress.    Appearance: He is well-developed. He is not diaphoretic.  HENT:     Head: Normocephalic and atraumatic.     Jaw: No trismus.     Right Ear: External ear normal.     Left Ear: External ear normal.     Nose: Nose normal.  Eyes:     General: No scleral icterus.    Conjunctiva/sclera: Conjunctivae normal.  Neck:     Trachea: Phonation normal.  Cardiovascular:     Rate and Rhythm: Normal rate and regular rhythm.  Pulmonary:     Effort: Pulmonary effort is normal. No respiratory distress.     Breath sounds: No stridor.  Abdominal:     General: There is no distension.  Musculoskeletal:        General: Normal range of motion.       Arms:     Cervical back: Normal range of motion.  Neurological:     Mental Status: He is alert and  oriented to person, place, and time.  Psychiatric:        Behavior: Behavior normal.     ED Results and Treatments Labs (all labs ordered are listed, but only abnormal results are displayed)  Labs Reviewed - No data to display                                                                                                                       EKG  EKG Interpretation  Date/Time:    Ventricular Rate:    PR Interval:    QRS Duration:   QT Interval:    QTC Calculation:   R Axis:     Text Interpretation:        Radiology No results found.  Pertinent labs & imaging results that were available during my care of the patient were reviewed by me and considered in my medical decision making (see chart for details).  Medications Ordered in ED Medications  HYDROcodone-acetaminophen (NORCO/VICODIN) 5-325 MG per tablet 1 tablet (1 tablet Oral Not Given 08/12/19 0222)  lidocaine (PF) (XYLOCAINE) 1 % injection 5 mL (5 mLs Intradermal Given 08/12/19 0224)                                                                                                                                    Procedures Ultrasound ED Soft Tissue  Date/Time: 08/12/2019 2:25 AM Performed by: Fatima Blank, MD Authorized by: Fatima Blank, MD   Procedure details:    Indications: localization of abscess     Transverse view:  Visualized   Longitudinal view:  Visualized   Images: archived   Location:    Location: upper extremity     Side:  Right Findings:     no abscess present Drain paronychia  Date/Time: 08/12/2019 2:25 AM Performed by: Fatima Blank, MD Authorized by: Fatima Blank, MD  Consent: Verbal consent obtained. Risks and benefits: risks, benefits and alternatives were discussed Consent given by: patient Patient understanding: patient states understanding of the procedure being performed Patient identity confirmed: verbally with patient Time out: Immediately prior  to procedure a "time out" was called to verify the correct patient, procedure, equipment, support staff and site/side marked as required. Local anesthesia used: yes Anesthesia: digital block  Anesthesia: Local anesthesia used: yes Local Anesthetic: lidocaine 1% without epinephrine  Sedation: Patient sedated: no  Patient tolerance: patient tolerated the procedure well with no immediate complications     (including critical care time)  Medical Decision Making / ED Course I have reviewed the nursing notes for this encounter and the patient's prior records (if available in EHR or on provided  paperwork).   ANIAS RUMMER was evaluated in Emergency Department on 08/12/2019 for the symptoms described in the history of present illness. He was evaluated in the context of the global COVID-19 pandemic, which necessitated consideration that the patient might be at risk for infection with the SARS-CoV-2 virus that causes COVID-19. Institutional protocols and algorithms that pertain to the evaluation of patients at risk for COVID-19 are in a state of rapid change based on information released by regulatory bodies including the CDC and federal and state organizations. These policies and algorithms were followed during the patient's care in the ED.  Possible paronychia vs cellulitis.  Bedside ultrasound of the thumb pulp without evidence of a felon.  Exam is not suspicious for flexor tenosynovitis.   I&D was performed under digital block. No purulence extracted. Likely cellulitic.   Recommend the patient continue with the clindamycin regimen.  He already has follow-up appointment with Dr. Ninfa Linden from Ortho care for this upcoming week.      Final Clinical Impression(s) / ED Diagnoses Final diagnoses:  Cellulitis of right thumb    The patient appears reasonably screened and/or stabilized for discharge and I doubt any other medical condition or other [facility] requiring further screening, evaluation, or  treatment in the ED at this time prior to discharge.  Disposition: Discharge  Condition: Good  I have discussed the results, Dx and Tx plan with the patient who expressed understanding and agree(s) with the plan. Discharge instructions discussed at great length. The patient was given strict return precautions who verbalized understanding of the instructions. No further questions at time of discharge.    ED Discharge Orders    None        Follow Up: Mcarthur Rossetti, [place] [address] [phone]   as scheduled      This chart was dictated using voice recognition software.  Despite best efforts to proofread,  errors can occur which can change the documentation meaning.   Fatima Blank, MD 08/12/19 [phone]

## 2019-08-12 NOTE — ED Notes (Signed)
Discharge instructions and follow up care discussed with pt. Pt verbalized understanding with no questions at this time.

## 2019-08-15 ENCOUNTER — Telehealth: Payer: Self-pay | Admitting: Radiology

## 2019-08-15 ENCOUNTER — Other Ambulatory Visit: Payer: Self-pay

## 2019-08-15 DIAGNOSIS — L03011 Cellulitis of right finger: Secondary | ICD-10-CM

## 2019-08-15 DIAGNOSIS — M79644 Pain in right finger(s): Secondary | ICD-10-CM

## 2019-08-15 NOTE — Telephone Encounter (Signed)
Patient called triage this morning in regards to cellulitis in his hand. He states that he has seen Dr .Junius Ray for this problem and then had to go to ED due to increased pain and it getting worse. He is interested in being sent to someone who specializes in treating infections.  He has an appt scheduled with Paul Ray on Wednesday. I explained to patient that Paul Ray is not in the office today. He does not wish to see anyone here and asked if we could please reach out to Paul Ray and ask him the question of who is best we get him referred to.  Paul Ray states that he has Paul Ray personal cell, but does not wish to use it and would like for [place] to ask. I explained that I was not sure what Paul Ray was doing today, that he is not in the office, but that I would be glad to send a message back to see if this was an option. If it is not, I advised we could ask Paul Ray to see who he recommends. Patient did not want to come here and see anyone today.  Please try patient on cell phone first [phone]. Home first [phone]

## 2019-08-15 NOTE — Telephone Encounter (Signed)
[employer] [facility]

## 2019-08-15 NOTE — Telephone Encounter (Signed)
LMOM for patient letting him know that I sent an order to hand specialist for him.

## 2019-08-15 NOTE — Telephone Encounter (Signed)
Can you please advise?

## 2019-08-17 ENCOUNTER — Other Ambulatory Visit: Payer: Self-pay

## 2019-08-17 ENCOUNTER — Encounter: Payer: Self-pay | Admitting: Orthopaedic Surgery

## 2019-08-17 ENCOUNTER — Ambulatory Visit (INDEPENDENT_AMBULATORY_CARE_PROVIDER_SITE_OTHER): Payer: BC Managed Care – PPO | Admitting: Orthopaedic Surgery

## 2019-08-17 DIAGNOSIS — L03011 Cellulitis of right finger: Secondary | ICD-10-CM

## 2019-08-17 MED ORDER — CLINDAMYCIN HCL 300 MG PO CAPS: 300.0000 mg | ORAL_CAPSULE | Freq: Three times a day (TID) | ORAL | 0 refills | Status: DC

## 2019-08-17 NOTE — Progress Notes (Signed)
Paul Ray is following up in the office for right thumb paronychia.  He is on clindamycin 3 times a day.  He has had this lanced in the office last week.  He still reports a throbbing pain but overall does report some improvement.  On examination overall it looks good on his right thumb at the dorsal aspect of it.  There is slight drainage but it is minimal.  There is no redness.  I will have him soak his thumb daily and antibacterial soapy water for 15 to 20 minutes.  He will then dry the soft roll well and place mupirocin ointment over the wound.  Then place a Band-Aid on top of this.  He will repeat this daily.  He will continue his clindamycin.  I would like to see him back in follow-up in 2 weeks to make sure this is resolving.  If he worsens in any way he will let us know.  All question concerns were answered and addressed.

## 2019-08-18 ENCOUNTER — Telehealth: Payer: Self-pay | Admitting: Orthopaedic Surgery

## 2019-08-18 NOTE — Telephone Encounter (Signed)
Adair Patter from [facility] called.   They need a pre medication form for the patient's appointment today detailing all, if any, premedications.   Email: [email or web address]   Call back number: [phone]

## 2019-08-18 NOTE — Telephone Encounter (Signed)
Emailed [place] to let them know he does not need any pre-med

## 2019-09-13 ENCOUNTER — Telehealth: Payer: Self-pay

## 2019-09-13 NOTE — Telephone Encounter (Signed)
Sending as FYI  Patient called the triage phone stating that he had an emergency and needed to speak with Dr Ninfa Linden. I advised patient Dr Ninfa Linden was in surgery but I could try to help him. He said that he knew Dr Ninfa Linden well and was sure that he was in clinic this afternoon, again I advised patient he was in surgery but could try to see if I could help him. He proceeded to tell me that  he had gotten his feet dirty and decided he needed to clean them and used the solution from the bottom of a clorox wipe container to wash his feet and now his feet were on fire and burning, the only relief he could get was putting them in water.  I advised patient that he should seek treatment at an ER or Urgent Care.  He told me that I was not a provider and that should ask Dr Junius Roads if he was available. I did speak with him and he too recommended patient seek treatment at ER. Patient voiced that he was upset with information he was provided and hung up on me.

## 2019-10-04 ENCOUNTER — Ambulatory Visit: Payer: BC Managed Care – PPO | Admitting: Orthopaedic Surgery

## 2019-11-02 ENCOUNTER — Ambulatory Visit: Payer: BC Managed Care – PPO | Admitting: Orthopaedic Surgery

## 2019-11-02 ENCOUNTER — Other Ambulatory Visit: Payer: Self-pay

## 2019-11-02 ENCOUNTER — Encounter: Payer: Self-pay | Admitting: Orthopaedic Surgery

## 2019-11-02 DIAGNOSIS — M25511 Pain in right shoulder: Secondary | ICD-10-CM | POA: Diagnosis not present

## 2019-11-02 DIAGNOSIS — G8929 Other chronic pain: Secondary | ICD-10-CM

## 2019-11-02 MED ORDER — METHYLPREDNISOLONE ACETATE 40 MG/ML IJ SUSP
40.0000 mg | INTRAMUSCULAR | Status: AC | PRN
  Administered 2019-11-02: 40 mg via INTRA_ARTICULAR

## 2019-11-02 MED ORDER — LIDOCAINE HCL 1 % IJ SOLN
3.0000 mL | INTRAMUSCULAR | Status: AC | PRN
  Administered 2019-11-02: 3 mL

## 2019-11-02 NOTE — Progress Notes (Signed)
Paul Ray is well-known to me.  He deals with chronic pain and a lot of different areas.  He does have known glenohumeral arthritis of his right shoulder.  He is requesting a shoulder injection today with steroid.  Its been at least 4 months or more since his last injection so I do agree with trying this today.  We had a long thorough discussion about his pain and dealing with his shoulder.  He said no other acute changes medical status other than some chronic right-sided chest pain.  He states that his pain management doctor did recommend an ultrasound after plain films were negative for enteric of broken rib.  He does not appear labored in his breathing.  He does have right upper quadrant pain to deep palpation.  This could suggest gallbladder pathology.  He does have an appointment in the next 2 weeks with his primary care physician and I encouraged him to keep that visit so this could be worked up more thoroughly.  On examination his right shoulder is well located with good range of motion but it has global pain and tenderness.  There is good strength in the shoulder itself.  I did place a steroid injection in his right shoulder without difficulty.  All question concerns were answered and addressed.  Follow-up can be as needed.   Procedure Note  Patient: Paul Ray             Date of Birth: 1955/10/05           MRN: [id number]             Visit Date: 11/02/2019  Procedures: Visit Diagnoses:  1. Chronic right shoulder pain     Large Joint Inj: R subacromial bursa on 11/02/2019 11:00 AM Indications: pain and diagnostic evaluation Details: 22 G 1.5 in needle  Arthrogram: No  Medications: 3 mL lidocaine 1 %; 40 mg methylPREDNISolone acetate 40 MG/ML Outcome: tolerated well, no immediate complications Procedure, treatment alternatives, risks and benefits explained, specific risks discussed. Consent was given by the patient. Immediately prior to procedure a time out was called to verify the  correct patient, procedure, equipment, support staff and site/side marked as required. Patient was prepped and draped in the usual sterile fashion.

## 2019-11-09 ENCOUNTER — Ambulatory Visit: Payer: BC Managed Care – PPO | Admitting: Orthopaedic Surgery

## 2019-11-24 ENCOUNTER — Telehealth: Payer: Self-pay | Admitting: Orthopaedic Surgery

## 2019-11-24 NOTE — Telephone Encounter (Signed)
Patient called requesting a call back from Dr. Ninfa Linden. Patient would like to discuss surgery without appointment. Patient phone number is [phone].

## 2020-02-21 ENCOUNTER — Telehealth: Payer: Self-pay | Admitting: Orthopaedic Surgery

## 2020-02-21 NOTE — Telephone Encounter (Signed)
Patient aware of the below message  

## 2020-02-21 NOTE — Telephone Encounter (Signed)
Patient called asked if Dr. Ninfa Linden will call him. Patient said his left side rib cage is hurting. Patient said he was playing basketball and  took a hit to his ribs. Patient asked if he can come in and get an X-ray. Patient is trying to get his insurance. The number to contact patient is [phone]

## 2020-02-21 NOTE — Telephone Encounter (Signed)
Your week is FULL

## 2020-02-21 NOTE — Telephone Encounter (Signed)
There is really nothing to do about rib pain regardless.  Can't see him this week.  He can always go to urgent care of the ER.

## 2020-03-28 ENCOUNTER — Other Ambulatory Visit: Payer: Self-pay

## 2020-03-28 ENCOUNTER — Ambulatory Visit (INDEPENDENT_AMBULATORY_CARE_PROVIDER_SITE_OTHER): Payer: PPO

## 2020-03-28 ENCOUNTER — Encounter: Payer: Self-pay | Admitting: Orthopaedic Surgery

## 2020-03-28 ENCOUNTER — Ambulatory Visit (INDEPENDENT_AMBULATORY_CARE_PROVIDER_SITE_OTHER): Payer: PPO | Admitting: Orthopaedic Surgery

## 2020-03-28 DIAGNOSIS — G8929 Other chronic pain: Secondary | ICD-10-CM

## 2020-03-28 DIAGNOSIS — R0781 Pleurodynia: Secondary | ICD-10-CM

## 2020-03-28 DIAGNOSIS — M542 Cervicalgia: Secondary | ICD-10-CM

## 2020-03-28 DIAGNOSIS — M25511 Pain in right shoulder: Secondary | ICD-10-CM | POA: Diagnosis not present

## 2020-03-28 MED ORDER — LIDOCAINE HCL 1 % IJ SOLN
3.0000 mL | INTRAMUSCULAR | Status: AC | PRN
  Administered 2020-03-28: 3 mL

## 2020-03-28 MED ORDER — METHYLPREDNISOLONE ACETATE 40 MG/ML IJ SUSP
40.0000 mg | INTRAMUSCULAR | Status: AC | PRN
  Administered 2020-03-28: 40 mg via INTRA_ARTICULAR

## 2020-03-28 NOTE — Progress Notes (Signed)
Office Visit Note   Patient: Paul Ray           Date of Birth: Dec 22, 1955           MRN: [id number] Visit Date: 03/28/2020              Requested by: Shirline Frees, MD [address],  [place] [address] PCP: Shirline Frees, MD   Assessment & Plan: Visit Diagnoses:  1. Neck pain   2. Rib pain on right side     Plan: I do feel it is appropriate to send Aureliano to outpatient physical therapy to see what they can do to decrease his right-sided neck pain as well as his right shoulder pain.  Any modalities would be appropriate.  I think the neck is more important to work with.  I did provide a steroid injection in his right shoulder subacromial space since has been almost 5 months since that he has had an injection.  At some point he may benefit from a shoulder replacement.  We still need to continue the path of conservative treatment.  All question concerns were answered and addressed.  We will work on hopefully ordering physical therapy for his right neck and shoulder.  Follow-Up Instructions: Return in about 4 weeks (around 04/25/2020).   Orders:  Orders Placed This Encounter  Procedures  . XR Cervical Spine 2 or 3 views  . XR Ribs Unilateral Right   No orders of the defined types were placed in this encounter.     Procedures: Large Joint Inj: R subacromial bursa on 03/28/2020 10:06 AM Indications: pain and diagnostic evaluation Details: 22 G 1.5 in needle  Arthrogram: No  Medications: 3 mL lidocaine 1 %; 40 mg methylPREDNISolone acetate 40 MG/ML Outcome: tolerated well, no immediate complications Procedure, treatment alternatives, risks and benefits explained, specific risks discussed. Consent was given by the patient. Immediately prior to procedure a time out was called to verify the correct patient, procedure, equipment, support staff and site/side marked as required. Patient was prepped and draped in the usual sterile fashion.       Clinical  Data: No additional findings.   Subjective: Chief Complaint  Patient presents with  . Neck - Pain  Bleu is well-known to me.  He has been dealing with severe arthritis in his right shoulder for some time and has developed significant right-sided neck pain.  He is also been a patient who is had to endure bilateral hip replacements that were done elsewhere with hip resurfacing arthroplasties.  He has been on chronic pain management for years and lets me know that he stopped all pain medications 2 days ago after a long wean from pain medications.  He denies any numbness tingling his hands but does report a significant right-sided neck pain and continued right shoulder pain.  HPI  Review of Systems Today he denies any headache, chest pain, shortness of breath, fever, chills, nausea, vomiting  Objective: Vital Signs: There were no vitals taken for this visit.  Physical Exam He is alert and orient x3 and in no acute distress Ortho Exam Examination of his right shoulder shows a prominent AC joint with pain at the [facility] joint and the good internal joint with pretty good range of motion of the shoulder.  Examination of the cervical spine shows pain on the right side of his neck to lateral bending and rotation and pain in the trapezius area with tight muscles all around the right side  of his cervical spine. Specialty Comments:  No specialty comments available.  Imaging: XR Ribs Unilateral Right  Result Date: 03/28/2020 3 views of the ribs to the right side showed no obvious rib fracture.  XR Cervical Spine 2 or 3 views  Result Date: 03/28/2020 2 views of the cervical spine show some degenerative changes in the mid cervical spine.  The alignment is normal but there are osteophytes anteriorly and laterally at the mid to lower cervical spine.    PMFS History: Patient Active Problem List   Diagnosis Date Noted  . Psychoactive substance-induced psychosis ([place])   . Rib pain 07/01/2017  . Closed  fracture of one rib of left side 07/01/2017  . Chronic right shoulder pain 05/04/2017  . Osteoarthritis of right hip 12/02/2016  . Impingement syndrome of right shoulder 07/05/2013  . Empyema lung ([place])   . Abscess of lung(513.0)   . Bipolar depression ([place])   . ADD (attention deficit disorder)   . Hepatitis C   . Hip pain 05/02/2011  . History of total hip arthroplasty 04/23/2011  . Lung mass 10/22/2010  . Pleural effusion 10/15/2010   Past Medical History:  Diagnosis Date  . Abscess of lung(513.0)    Right lower lobe  . ADD (attention deficit disorder)   . Anxiety   . Arthritis   . Bipolar depression ([place])   . Empyema lung ([place])   . Hemorrhoids   . Hepatitis C   . Pneumonia    last year  . Prostate abscess   . Prostate troubles     Family History  Problem Relation Age of Onset  . COPD Mother   . Heart disease Mother   . Hypertension Mother   . Coronary artery disease Father   . Dementia Father   . Heart failure Father   . Prostate cancer Paternal Grandfather        also had bone cancer  . Breast cancer Maternal Grandmother   . Cancer Paternal Grandmother        unsure what kind    Past Surgical History:  Procedure Laterality Date  . ELBOW SURGERY  2005   Right  . FOOT NEUROMA SURGERY  2006   Left  . HIP RESURFACING     left hip at [facility] 01/2011  . LUMBAR LAMINECTOMY/DECOMPRESSION MICRODISCECTOMY  07/24/2011   Procedure: LUMBAR LAMINECTOMY/DECOMPRESSION MICRODISCECTOMY;  Surgeon: Eustace Moore;  Location: [place] NEURO ORS;  Service: Neurosurgery;  Laterality: Bilateral;  Bilateral  , Lumbar Three-Four, Lumbar Four-Five Decompressive Laminectomy Rm # 32  . LUNG SURGERY     for empyema  . SHOULDER ARTHROSCOPY Right 07/05/2013   Procedure: RIGHT SHOULDER ARTHROSCOPY WITH DEBRIDEMENT;  Surgeon: Mcarthur Rossetti, MD;  Location: [place];  Service: Orthopedics;  Laterality: Right;  . TENDON TRANSFER Left 01/26/2014   Procedure: LEFT THUMB TRAPEZIECTOMY KNOTTED TENDON  INTRAPOSITION TRANSFER OF ABDUCTOR POLLICIS LONGUS TO [place];  Surgeon: Cammie Sickle, MD;  Location: [place];  Service: Orthopedics;  Laterality: Left;  . THORACOTOMY  November 14 2010   rt   Social History   Occupational History  . Occupation: Games developer  Tobacco Use  . Smoking status: Never Smoker  . Smokeless tobacco: Never Used  Substance and Sexual Activity  . Alcohol use: No    Comment: quit drinking in 1989  . Drug use: No  . Sexual activity: Not on file

## 2020-04-04 ENCOUNTER — Other Ambulatory Visit: Payer: Self-pay

## 2020-04-04 ENCOUNTER — Telehealth: Payer: Self-pay | Admitting: Orthopaedic Surgery

## 2020-04-04 DIAGNOSIS — R0782 Intercostal pain: Secondary | ICD-10-CM

## 2020-04-04 DIAGNOSIS — R0781 Pleurodynia: Secondary | ICD-10-CM

## 2020-04-04 NOTE — Telephone Encounter (Signed)
Patient called requesting a call back from Dr. Ninfa Linden. Patient states needs to know the next step for his rib pains. Patient states this is an urgent call. Patient phone number is [phone].

## 2020-04-04 NOTE — Telephone Encounter (Signed)
Sent order.

## 2020-04-04 NOTE — Telephone Encounter (Signed)
Order a chest CT scan to evaluate chronic rib pain. Thanks

## 2020-04-13 ENCOUNTER — Other Ambulatory Visit: Payer: Self-pay

## 2020-04-13 ENCOUNTER — Encounter: Payer: Self-pay | Admitting: Rehabilitative and Restorative Service Providers"

## 2020-04-13 ENCOUNTER — Ambulatory Visit (INDEPENDENT_AMBULATORY_CARE_PROVIDER_SITE_OTHER): Payer: PPO | Admitting: Rehabilitative and Restorative Service Providers"

## 2020-04-13 ENCOUNTER — Ambulatory Visit: Payer: PPO | Admitting: Rehabilitative and Restorative Service Providers"

## 2020-04-13 DIAGNOSIS — R293 Abnormal posture: Secondary | ICD-10-CM | POA: Diagnosis not present

## 2020-04-13 DIAGNOSIS — M542 Cervicalgia: Secondary | ICD-10-CM | POA: Diagnosis not present

## 2020-04-13 DIAGNOSIS — R05 Cough: Secondary | ICD-10-CM | POA: Diagnosis not present

## 2020-04-13 NOTE — Patient Instructions (Signed)
Access Code: [email or web address] URL: [email or web address] Date: 04/13/2020 Prepared by: Vista Mink  Exercises Standing Scapular Retraction - 5 x daily - 7 x weekly - 1 sets - 5 reps - 5 hold Standing Isometric Cervical Retraction with Chin Tucks and Ball at [place] 5 x daily - 7 x weekly - 1 sets - 5 reps - 5 seconds hold Seated Isometric Cervical Sidebending - 5 x daily - 7 x weekly - 1 sets - 5 reps - 5 seconds hold

## 2020-04-13 NOTE — Therapy (Signed)
[facility] Physical Therapy [address] [place], [place], [address] Phone: [phone]   Fax:  [phone]  Physical Therapy Evaluation  Patient Details  Name: Paul Ray MRN: [id number] Date of Birth: [birth year] Referring Provider (PT): Jean Rosenthal MD   Encounter Date: 04/13/2020   PT End of Session - 04/13/20 1524    Visit Number 1    Number of Visits 16    PT Start Time 0272    PT Stop Time 5366    PT Time Calculation (min) 40 min    Activity Tolerance Patient tolerated treatment well;No increased pain    Behavior During Therapy Restless;Agitated           Past Medical History:  Diagnosis Date  . Abscess of lung(513.0)    Right lower lobe  . ADD (attention deficit disorder)   . Anxiety   . Arthritis   . Bipolar depression ([place])   . Empyema lung ([place])   . Hemorrhoids   . Hepatitis C   . Pneumonia    last year  . Prostate abscess   . Prostate troubles     Past Surgical History:  Procedure Laterality Date  . ELBOW SURGERY  2005   Right  . FOOT NEUROMA SURGERY  2006   Left  . HIP RESURFACING     left hip at [facility] 01/2011  . LUMBAR LAMINECTOMY/DECOMPRESSION MICRODISCECTOMY  07/24/2011   Procedure: LUMBAR LAMINECTOMY/DECOMPRESSION MICRODISCECTOMY;  Surgeon: Eustace Moore;  Location: [place] [facility];  Service: Neurosurgery;  Laterality: Bilateral;  Bilateral  , Lumbar Three-Four, Lumbar Four-Five Decompressive Laminectomy Rm # 32  . LUNG SURGERY     for empyema  . SHOULDER ARTHROSCOPY Right 07/05/2013   Procedure: RIGHT SHOULDER ARTHROSCOPY WITH DEBRIDEMENT;  Surgeon: Mcarthur Rossetti, MD;  Location: [place];  Service: Orthopedics;  Laterality: Right;  . TENDON TRANSFER Left 01/26/2014   Procedure: LEFT THUMB TRAPEZIECTOMY KNOTTED TENDON INTRAPOSITION TRANSFER OF ABDUCTOR POLLICIS LONGUS TO [place];  Surgeon: Cammie Sickle, MD;  Location: [place];  Service: Orthopedics;  Laterality: Left;  . THORACOTOMY  November 14 2010   rt    There were no vitals filed for this visit.    Subjective Assessment - 04/13/20 1115    Subjective Paul Ray reports a month + long history of neck and clavicular pain.    Pertinent History Previous laminectomy, shoulder arthroscopy, difficulty with getting off pain killers    Limitations Lifting    How long can you sit comfortably? Doesn't sit long    Patient Stated Goals Return to normal physically demanding activities like martial arts.    Currently in Pain? Yes    Pain Score 5     Pain Location Neck    Pain Orientation Lower    Pain Descriptors / Indicators Aching;Constant;Sore    Pain Type Chronic pain    Pain Radiating Towards Clavicle    Pain Onset More than a month ago    Pain Frequency Constant    Aggravating Factors  Lifting and physical activity    Pain Relieving Factors None    Effect of Pain on Daily Activities Limits participation in more physically demanding activities    Multiple Pain Sites No              [facility] PT Assessment - 04/13/20 0001      Assessment   Medical Diagnosis Neck pain    Referring Provider (PT) Jean Rosenthal MD      Balance Screen  Has the patient fallen in the past 6 months No    Has the patient had a decrease in activity level because of a fear of falling?  No    Is the patient reluctant to leave their home because of a fear of falling?  No      Cognition   Overall Cognitive Status Within Functional Limits for tasks assessed    Behaviors Poor frustration tolerance      ROM / Strength   AROM / PROM / Strength AROM;Strength      AROM   Overall AROM  Deficits    AROM Assessment Site Cervical    Cervical Extension 65    Cervical - Right Side Bend 15    Cervical - Left Side Bend 20    Cervical - Right Rotation 55    Cervical - Left Rotation 40      Strength   Overall Strength Deficits    Strength Assessment Site Cervical    Cervical Extension 4-/5    Cervical - Right Side Bend 5/5    Cervical - Left  Side Bend 4-/5                      Objective measurements completed on examination: See above findings.       [facility] Adult PT Treatment/Exercise - 04/13/20 0001      Therapeutic Activites    Therapeutic Activities ADL's    ADL's Posture, basic lift techniques and spine anatomy education      Exercises   Exercises Neck      Neck Exercises: Theraband   Scapula Retraction 20 reps   5 seconds shoulder blade pinches     Neck Exercises: Standing   Other Standing Exercises Cervical Isometrics Extension and lateral bending 10X each 5 seconds                  PT Education - 04/13/20 1523    Education Details Prescribed and reviewed an appropriate HEP.  Discussed and demonstrated correct posture, basic lift techniques and reviewed spine anatomy.    Person(s) Educated Patient    Methods Explanation;Demonstration;Verbal cues;Tactile cues;Handout    Comprehension Returned demonstration;Need further instruction;Verbalized understanding;Verbal cues required;Tactile cues required               PT Long Term Goals - 04/13/20 1529      PT LONG TERM GOAL #1   Title Paul Ray will report cervical and clavicular pain consistently 0-3/10 on the Numeric Pain Rating Scale.    Baseline Can be 10/10    Time 8    Period Weeks    Status New    Target Date 06/08/20      PT LONG TERM GOAL #2   Title Paul Ray will be able to return to his martial arts and physically demanding hobbies at [place].    Baseline Out of these activities.    Time 8    Period Weeks    Status New    Target Date 06/08/20      PT LONG TERM GOAL #3   Title Improve cervical AROM for rotation to 60 degrees bilaterally, lateral bending to 30 degrees bilaterally and extension to 65 degrees without pain.    Baseline See objective.    Time 8    Period Weeks    Status New    Target Date 06/08/20      PT LONG TERM GOAL #4   Title Improve cervical strength to 5/5 MMT.  Baseline See objective.    Time 8     Period Weeks    Status New    Target Date 06/08/20      PT LONG TERM GOAL #5   Title Paul Ray will be independent and compliant with an appropriate HEP at [place].    Time 8    Period Weeks    Status New    Target Date 06/08/20                  Plan - 04/13/20 1148    Clinical Impression Statement Paul Ray has neck pain that is at least partially related to his posture.  Correcting rounded shoulders and poor body mechanics will complement cervical strength work to return Paul Ray to the physically active life he had pre-episode.  He is just getting off long-term pain medication use and is having some difficulty with that.    Personal Factors and Comorbidities Behavior Pattern;Comorbidity 1    Comorbidities Previous lumbar laminectomy    Examination-Activity Limitations Lift    Examination-Participation Restrictions Community Activity;Other    Stability/Clinical Decision Making Stable/Uncomplicated    Clinical Decision Making Low    Rehab Potential Good    PT Frequency 1x / week    PT Duration 12 weeks    PT Treatment/Interventions ADLs/Self Care Home Management;Cryotherapy;Traction;Therapeutic exercise;Therapeutic activities;Patient/family education;Dry needling    PT Next Visit Plan Postural and cervical strength work along with posture and body mechanics practical work.    PT Home Exercise Plan Access Code: [email or web address]    Consulted and Agree with Plan of Care Patient           Patient will benefit from skilled therapeutic intervention in order to improve the following deficits and impairments:  Decreased activity tolerance, Decreased endurance, Decreased safety awareness, Decreased range of motion, Decreased strength, Impaired perceived functional ability, Postural dysfunction, Improper body mechanics, Pain  Visit Diagnosis: Cervicalgia  Abnormal posture     Problem List Patient Active Problem List   Diagnosis Date Noted  . Psychoactive substance-induced psychosis ([place])   .  Rib pain 07/01/2017  . Closed fracture of one rib of left side 07/01/2017  . Chronic right shoulder pain 05/04/2017  . Osteoarthritis of right hip 12/02/2016  . Impingement syndrome of right shoulder 07/05/2013  . Empyema lung ([place])   . Abscess of lung(513.0)   . Bipolar depression ([place])   . ADD (attention deficit disorder)   . Hepatitis C   . Hip pain 05/02/2011  . History of total hip arthroplasty 04/23/2011  . Lung mass 10/22/2010  . Pleural effusion 10/15/2010    Farley Ly PT, MPT 04/13/2020, 3:34 PM  [facility] [address] [place], [place], [address] Phone: [phone]   Fax:  [phone]  Name: Paul Ray MRN: [id number] Date of Birth: July 04, 1956

## 2020-04-13 NOTE — Addendum Note (Signed)
Addended by: Maricela Bo on: 04/13/2020 03:51 PM   Modules accepted: Orders

## 2020-04-17 ENCOUNTER — Other Ambulatory Visit: Payer: PPO

## 2020-04-19 DIAGNOSIS — Z23 Encounter for immunization: Secondary | ICD-10-CM | POA: Diagnosis not present

## 2020-04-19 DIAGNOSIS — S90512A Abrasion, left ankle, initial encounter: Secondary | ICD-10-CM | POA: Diagnosis not present

## 2020-04-20 ENCOUNTER — Other Ambulatory Visit: Payer: Self-pay

## 2020-04-20 ENCOUNTER — Ambulatory Visit (INDEPENDENT_AMBULATORY_CARE_PROVIDER_SITE_OTHER): Payer: PPO | Admitting: Rehabilitative and Restorative Service Providers"

## 2020-04-20 ENCOUNTER — Encounter: Payer: Self-pay | Admitting: Rehabilitative and Restorative Service Providers"

## 2020-04-20 DIAGNOSIS — R293 Abnormal posture: Secondary | ICD-10-CM | POA: Diagnosis not present

## 2020-04-20 DIAGNOSIS — M542 Cervicalgia: Secondary | ICD-10-CM | POA: Diagnosis not present

## 2020-04-20 NOTE — Therapy (Signed)
[facility] Physical Therapy [address] [place], [place], [address] Phone: [phone]   Fax:  [phone]  Physical Therapy Treatment  Patient Details  Name: Paul Ray MRN: [id number] Date of Birth: 1956-04-12 Referring Provider (PT): Jean Rosenthal MD   Encounter Date: 04/20/2020   PT End of Session - 04/20/20 1800    Visit Number 2    Number of Visits 16    PT Start Time 9381    PT Stop Time 8299    PT Time Calculation (min) 43 min    Activity Tolerance Patient tolerated treatment well;No increased pain    Behavior During Therapy WFL for tasks assessed/performed           Past Medical History:  Diagnosis Date  . Abscess of lung(513.0)    Right lower lobe  . ADD (attention deficit disorder)   . Anxiety   . Arthritis   . Bipolar depression ([place])   . Empyema lung ([place])   . Hemorrhoids   . Hepatitis C   . Pneumonia    last year  . Prostate abscess   . Prostate troubles     Past Surgical History:  Procedure Laterality Date  . ELBOW SURGERY  2005   Right  . FOOT NEUROMA SURGERY  2006   Left  . HIP RESURFACING     left hip at [facility] 01/2011  . LUMBAR LAMINECTOMY/DECOMPRESSION MICRODISCECTOMY  07/24/2011   Procedure: LUMBAR LAMINECTOMY/DECOMPRESSION MICRODISCECTOMY;  Surgeon: Eustace Moore;  Location: [place] [facility];  Service: Neurosurgery;  Laterality: Bilateral;  Bilateral  , Lumbar Three-Four, Lumbar Four-Five Decompressive Laminectomy Rm # 32  . LUNG SURGERY     for empyema  . SHOULDER ARTHROSCOPY Right 07/05/2013   Procedure: RIGHT SHOULDER ARTHROSCOPY WITH DEBRIDEMENT;  Surgeon: Mcarthur Rossetti, MD;  Location: [place];  Service: Orthopedics;  Laterality: Right;  . TENDON TRANSFER Left 01/26/2014   Procedure: LEFT THUMB TRAPEZIECTOMY KNOTTED TENDON INTRAPOSITION TRANSFER OF ABDUCTOR POLLICIS LONGUS TO [place];  Surgeon: Cammie Sickle, MD;  Location: [place];  Service: Orthopedics;  Laterality: Left;  .  THORACOTOMY  November 14 2010   rt    There were no vitals filed for this visit.   Subjective Assessment - 04/20/20 1018    Subjective Paul Ray reports challenges with his HEP.  They were addressed today.    Pertinent History Previous laminectomy, shoulder arthroscopy, difficulty with getting off pain killers    Limitations Lifting    How long can you sit comfortably? Doesn't sit long    Patient Stated Goals Return to normal physically demanding activities like martial arts.    Currently in Pain? Yes    Pain Score 5     Pain Location Neck    Pain Orientation Lower    Pain Descriptors / Indicators Aching;Constant;Sore    Pain Type Chronic pain    Pain Radiating Towards Clavicle    Pain Onset More than a month ago    Pain Frequency Constant    Aggravating Factors  Heavy physical work    Effect of Pain on Daily Activities Limits Paul Ray's ability to do physically challenging things.    Multiple Pain Sites No                             [facility] Adult PT Treatment/Exercise - 04/20/20 0001      Exercises   Exercises Neck      Neck Exercises: Theraband  Scapula Retraction 20 reps   5 seconds shoulder blade pinches   Scapula Retraction Limitations Regular SBP and SBP with shoulder hike 20X 5 seconds each    Rows 20 reps;Blue   Slow eccentrics   Shoulder External Rotation Red;10 reps   3 seconds slow eccentrics   Other Theraband Exercises Palms up extension scapular retraction 10X red slow eccentrics      Neck Exercises: Standing   Other Standing Exercises Cervical Isometrics Extension and lateral bending 10X each 5 seconds                  PT Education - 04/20/20 1759    Education Details Reviewed HEP and addressed challenges with isometrics by adding pillows and correcting posture.  Added new scapular and postural strengthening activities to Paul Ray's HEP.    Person(s) Educated Patient    Methods Explanation;Demonstration;Verbal cues;Tactile cues;Handout     Comprehension Verbalized understanding;Returned demonstration;Verbal cues required;Tactile cues required;Need further instruction               PT Long Term Goals - 04/20/20 1800      PT LONG TERM GOAL #1   Title Paul Ray will report cervical and clavicular pain consistently 0-3/10 on the Numeric Pain Rating Scale.    Baseline Can be 10/10    Time 8    Period Weeks    Status On-going      PT LONG TERM GOAL #2   Title Paul Ray will be able to return to his martial arts and physically demanding hobbies at [place].    Baseline Out of these activities.    Time 8    Period Weeks    Status On-going      PT LONG TERM GOAL #3   Title Improve cervical AROM for rotation to 60 degrees bilaterally, lateral bending to 30 degrees bilaterally and extension to 65 degrees without pain.    Baseline See objective.    Time 8    Period Weeks    Status On-going      PT LONG TERM GOAL #4   Title Improve cervical strength to 5/5 MMT.    Baseline See objective.    Time 8    Period Weeks    Status On-going      PT LONG TERM GOAL #5   Title Paul Ray will be independent and compliant with an appropriate HEP at [place].    Time 8    Period Weeks    Status On-going                 Plan - 04/20/20 1100    Clinical Impression Statement Paul Ray reports doing some heavy overhead lifting that flared-up his neck.  We reviewed the importance of being selective with heavier physical demand level activities, watching mechanics and the advantages of waiting until stronger/better before returning to those activities.  Cervical, scapular and postural strengthening remains the focus, along with body mechanics and behavior modification.    Personal Factors and Comorbidities Comorbidity 1    Comorbidities Previous lumbar laminectomy    Examination-Activity Limitations Lift    Examination-Participation Restrictions Community Activity;Other    Stability/Clinical Decision Making Stable/Uncomplicated    Rehab Potential Good     PT Frequency 1x / week    PT Duration 12 weeks    PT Treatment/Interventions ADLs/Self Care Home Management;Cryotherapy;Traction;Therapeutic exercise;Therapeutic activities;Patient/family education;Dry needling    PT Next Visit Plan Postural and cervical strength work along with posture and body mechanics practical work.    PT  Home Exercise Plan Access Code: [email or web address], Access Code: [email or web address]    Consulted and Agree with Plan of Care Patient           Patient will benefit from skilled therapeutic intervention in order to improve the following deficits and impairments:  Decreased activity tolerance, Decreased endurance, Decreased safety awareness, Decreased range of motion, Decreased strength, Impaired perceived functional ability, Postural dysfunction, Improper body mechanics, Pain  Visit Diagnosis: Abnormal posture  Cervicalgia     Problem List Patient Active Problem List   Diagnosis Date Noted  . Psychoactive substance-induced psychosis ([place])   . Rib pain 07/01/2017  . Closed fracture of one rib of left side 07/01/2017  . Chronic right shoulder pain 05/04/2017  . Osteoarthritis of right hip 12/02/2016  . Impingement syndrome of right shoulder 07/05/2013  . Empyema lung ([place])   . Abscess of lung(513.0)   . Bipolar depression ([place])   . ADD (attention deficit disorder)   . Hepatitis C   . Hip pain 05/02/2011  . History of total hip arthroplasty 04/23/2011  . Lung mass 10/22/2010  . Pleural effusion 10/15/2010    Farley Ly PT, MPT 04/20/2020, 6:03 PM  [facility] Physical Therapy [address] [place], [place], [address] Phone: [phone]   Fax:  [phone]  Name: Paul Ray MRN: [id number] Date of Birth: Nov 06, 1955

## 2020-04-20 NOTE — Patient Instructions (Signed)
Access Code: [email or web address] URL: [email or web address] Date: 04/20/2020 Prepared by: Vista Mink  Exercises Scapular Retraction with Resistance - 1 x daily - 7 x weekly - 1-2 sets - 20 reps - 5 seconds hold Shoulder External Rotation with Anchored Resistance with Towel Under Elbow - 1 x daily - 7 x weekly - 1-2 sets - 10 reps - 3 hold Shoulder Extension with Resistance Hands Down - 1 x daily - 7 x weekly - 1-2 sets - 10 reps

## 2020-04-25 ENCOUNTER — Ambulatory Visit (INDEPENDENT_AMBULATORY_CARE_PROVIDER_SITE_OTHER): Payer: PPO | Admitting: Orthopaedic Surgery

## 2020-04-25 ENCOUNTER — Encounter: Payer: Self-pay | Admitting: Orthopaedic Surgery

## 2020-04-25 ENCOUNTER — Other Ambulatory Visit: Payer: Self-pay

## 2020-04-25 DIAGNOSIS — G8929 Other chronic pain: Secondary | ICD-10-CM | POA: Diagnosis not present

## 2020-04-25 DIAGNOSIS — R0781 Pleurodynia: Secondary | ICD-10-CM

## 2020-04-25 DIAGNOSIS — M25511 Pain in right shoulder: Secondary | ICD-10-CM

## 2020-04-25 DIAGNOSIS — M542 Cervicalgia: Secondary | ICD-10-CM

## 2020-04-25 NOTE — Progress Notes (Signed)
Paul Ray has been ~2 physical therapy sessions for his neck and right shoulder.  He is very pleased with the physical therapist and what they are doing for him.  He is still having significant and severe right shoulder pain.  He has good range of motion of the shoulder but it certainly grinding at the [facility] joint and the glenohumeral joint.  At this point we will obtain a CT scan with thin cuts and without contrast of the right shoulder to assess the glenohumeral joint due to the possibility of the need for shoulder replacement surgery.  We have performed arthroscopic intervention on the shoulder in the past and found significant cartilage changes at the glenohumeral joint.  He agrees with this treatment plan.  We will see him back in 2 weeks to go over the CT scan.  He will continue therapy in the interim.

## 2020-04-27 ENCOUNTER — Encounter: Payer: Self-pay | Admitting: Rehabilitative and Restorative Service Providers"

## 2020-04-27 ENCOUNTER — Other Ambulatory Visit: Payer: Self-pay

## 2020-04-27 ENCOUNTER — Ambulatory Visit (INDEPENDENT_AMBULATORY_CARE_PROVIDER_SITE_OTHER): Payer: PPO | Admitting: Rehabilitative and Restorative Service Providers"

## 2020-04-27 DIAGNOSIS — M542 Cervicalgia: Secondary | ICD-10-CM | POA: Diagnosis not present

## 2020-04-27 DIAGNOSIS — R293 Abnormal posture: Secondary | ICD-10-CM

## 2020-04-27 NOTE — Therapy (Signed)
[facility] Physical Therapy [address] [place], [place], [address] Phone: [phone]   Fax:  [phone]  Physical Therapy Treatment  Patient Details  Name: Paul Ray MRN: [id number] Date of Birth: [birth year] Referring Provider (PT): Jean Rosenthal MD   Encounter Date: 04/27/2020   PT End of Session - 04/27/20 1208    Visit Number 3    Number of Visits 16    PT Start Time 9675    PT Stop Time 9163    PT Time Calculation (min) 43 min    Activity Tolerance Patient tolerated treatment well;No increased pain    Behavior During Therapy WFL for tasks assessed/performed           Past Medical History:  Diagnosis Date  . Abscess of lung(513.0)    Right lower lobe  . ADD (attention deficit disorder)   . Anxiety   . Arthritis   . Bipolar depression ([place])   . Empyema lung ([place])   . Hemorrhoids   . Hepatitis C   . Pneumonia    last year  . Prostate abscess   . Prostate troubles     Past Surgical History:  Procedure Laterality Date  . ELBOW SURGERY  2005   Right  . FOOT NEUROMA SURGERY  2006   Left  . HIP RESURFACING     left hip at [facility] 01/2011  . LUMBAR LAMINECTOMY/DECOMPRESSION MICRODISCECTOMY  07/24/2011   Procedure: LUMBAR LAMINECTOMY/DECOMPRESSION MICRODISCECTOMY;  Surgeon: Eustace Moore;  Location: [place] [facility];  Service: Neurosurgery;  Laterality: Bilateral;  Bilateral  , Lumbar Three-Four, Lumbar Four-Five Decompressive Laminectomy Rm # 32  . LUNG SURGERY     for empyema  . SHOULDER ARTHROSCOPY Right 07/05/2013   Procedure: RIGHT SHOULDER ARTHROSCOPY WITH DEBRIDEMENT;  Surgeon: Mcarthur Rossetti, MD;  Location: [place];  Service: Orthopedics;  Laterality: Right;  . TENDON TRANSFER Left 01/26/2014   Procedure: LEFT THUMB TRAPEZIECTOMY KNOTTED TENDON INTRAPOSITION TRANSFER OF ABDUCTOR POLLICIS LONGUS TO [place];  Surgeon: Cammie Sickle, MD;  Location: [place];  Service: Orthopedics;  Laterality: Left;  .  THORACOTOMY  November 14 2010   rt    There were no vitals filed for this visit.   Subjective Assessment - 04/27/20 1205    Subjective Paul Ray reports progress with his neck pain.  He does have some intercostal pain that he is following-up with Dr. Ninfa Linden on.    Pertinent History Previous laminectomy, shoulder arthroscopy, difficulty with getting off pain killers    Limitations Lifting    How long can you sit comfortably? Doesn't sit long    Patient Stated Goals Return to normal physically demanding activities like martial arts.    Currently in Pain? Yes    Pain Score 4     Pain Location Neck    Pain Descriptors / Indicators Aching;Sore    Pain Type Chronic pain    Pain Radiating Towards Clavicle    Pain Onset More than a month ago    Pain Frequency Intermittent    Aggravating Factors  Heavy physical work    Pain Relieving Factors Exercises and postural correction    Effect of Pain on Daily Activities Unable to do physically demanding activities or participate in martial arts.    Multiple Pain Sites No                             [facility] Adult PT Treatment/Exercise - 04/27/20 0001  Exercises   Exercises Neck      Neck Exercises: Theraband   Scapula Retraction 20 reps   5 seconds shoulder blade pinches   Scapula Retraction Limitations Regular SBP and SBP with shoulder hike 20X 5 seconds each    Rows 20 reps;Blue   Slow eccentrics   Shoulder External Rotation 10 reps;Green   3 seconds slow eccentrics 2 sets   Other Theraband Exercises Palms up extension scapular retraction 10X green slow eccentrics      Neck Exercises: Standing   Other Standing Exercises Cervical Isometrics Extension and lateral bending 10X each 5 seconds      Neck Exercises: Prone   Other Prone Exercise Prone opposite arm & leg extensions 2 sets of 10 3 seconds    Other Prone Exercise Prone B arm & leg extensions 10X  3 seconds                   PT Education - 04/27/20 1207     Education Details Reviewed HEP.  Added progressions to postural, upper back and scapular strengthening.    Person(s) Educated Patient    Methods Explanation;Demonstration;Tactile cues;Handout    Comprehension Verbal cues required;Returned demonstration;Verbalized understanding;Need further instruction               PT Long Term Goals - 04/27/20 1208      PT LONG TERM GOAL #1   Title Paul Ray will report cervical and clavicular pain consistently 0-3/10 on the Numeric Pain Rating Scale.    Baseline Can be 10/10    Time 8    Period Weeks    Status On-going      PT LONG TERM GOAL #2   Title Paul Ray will be able to return to his martial arts and physically demanding hobbies at [place].    Baseline Out of these activities.    Time 8    Period Weeks    Status On-going      PT LONG TERM GOAL #3   Title Improve cervical AROM for rotation to 60 degrees bilaterally, lateral bending to 30 degrees bilaterally and extension to 65 degrees without pain.    Baseline See objective.    Time 8    Period Weeks    Status On-going      PT LONG TERM GOAL #4   Title Improve cervical strength to 5/5 MMT.    Baseline See objective.    Time 8    Period Weeks    Status On-going      PT LONG TERM GOAL #5   Title Paul Ray will be independent and compliant with an appropriate HEP at DC.    Time 8    Period Weeks    Status On-going                 Plan - 04/27/20 1210    Clinical Impression Statement Paul Ray reports early progress with his strength although his neck still hurts.  I encouraged him to give it another 1-2 weeks before expecting any significant change in his pain.  Progressed postural/scapular/upper back strengthening today.  Continue current plan with RA in 2 weeks to assess progress towards long-term goals established at evaluation.    Personal Factors and Comorbidities Comorbidity 1    Comorbidities Previous lumbar laminectomy    Examination-Activity Limitations Lift     Examination-Participation Restrictions Community Activity;Other    Stability/Clinical Decision Making Stable/Uncomplicated    Rehab Potential Good    PT Frequency 1x / week  PT Duration 12 weeks    PT Treatment/Interventions ADLs/Self Care Home Management;Cryotherapy;Traction;Therapeutic exercise;Therapeutic activities;Patient/family education;Dry needling    PT Next Visit Plan Postural and cervical strength work along with posture and body mechanics practical work.    PT Home Exercise Plan Access Code: [email or web address], Access Code: [email or web address]. Access Code: [email or web address]    Consulted and Agree with Plan of Care Patient           Patient will benefit from skilled therapeutic intervention in order to improve the following deficits and impairments:  Decreased activity tolerance, Decreased endurance, Decreased safety awareness, Decreased range of motion, Decreased strength, Impaired perceived functional ability, Postural dysfunction, Improper body mechanics, Pain  Visit Diagnosis: Abnormal posture  Cervicalgia     Problem List Patient Active Problem List   Diagnosis Date Noted  . Psychoactive substance-induced psychosis ([place])   . Rib pain 07/01/2017  . Closed fracture of one rib of left side 07/01/2017  . Chronic right shoulder pain 05/04/2017  . Osteoarthritis of right hip 12/02/2016  . Impingement syndrome of right shoulder 07/05/2013  . Empyema lung ([place])   . Abscess of lung(513.0)   . Bipolar depression ([place])   . ADD (attention deficit disorder)   . Hepatitis C   . Hip pain 05/02/2011  . History of total hip arthroplasty 04/23/2011  . Lung mass 10/22/2010  . Pleural effusion 10/15/2010    Farley Ly PT, MPT 04/27/2020, 12:14 PM  [facility] Physical Therapy [address] [place], [place], [address] Phone: [phone]   Fax:  [phone]  Name: Paul Ray MRN: [id number] Date of Birth: July 26, 1956

## 2020-04-27 NOTE — Patient Instructions (Signed)
Access Code: [email or web address] URL: [email or web address] Date: 04/27/2020 Prepared by: Vista Mink  Exercises Prone Alternating Arm and Leg Lifts - 1 x daily - 7 x weekly - 2 sets - 10 reps - 3 seconds hold Superman - 1 x daily - 7 x weekly - 1 sets - 10 reps - 3 seconds hold

## 2020-05-03 DIAGNOSIS — R197 Diarrhea, unspecified: Secondary | ICD-10-CM | POA: Diagnosis not present

## 2020-05-03 DIAGNOSIS — R059 Cough, unspecified: Secondary | ICD-10-CM | POA: Diagnosis not present

## 2020-05-04 ENCOUNTER — Ambulatory Visit (INDEPENDENT_AMBULATORY_CARE_PROVIDER_SITE_OTHER): Payer: PPO | Admitting: Rehabilitative and Restorative Service Providers"

## 2020-05-04 ENCOUNTER — Other Ambulatory Visit: Payer: Self-pay

## 2020-05-04 ENCOUNTER — Encounter: Payer: Self-pay | Admitting: Rehabilitative and Restorative Service Providers"

## 2020-05-04 DIAGNOSIS — M6281 Muscle weakness (generalized): Secondary | ICD-10-CM | POA: Diagnosis not present

## 2020-05-04 DIAGNOSIS — M542 Cervicalgia: Secondary | ICD-10-CM | POA: Diagnosis not present

## 2020-05-04 DIAGNOSIS — R0781 Pleurodynia: Secondary | ICD-10-CM | POA: Diagnosis not present

## 2020-05-04 DIAGNOSIS — R293 Abnormal posture: Secondary | ICD-10-CM

## 2020-05-04 NOTE — Therapy (Signed)
[facility] Physical Therapy [address] [place], [place], [address] Phone: [phone]   Fax:  [phone]  Physical Therapy Treatment/Reassessment  Patient Details  Name: Paul Ray MRN: [id number] Date of Birth: [birth year] Referring Provider (PT): Jean Rosenthal MD   Encounter Date: 05/04/2020   PT End of Session - 05/04/20 1641    Visit Number 4    Number of Visits 16    PT Start Time 7628    PT Stop Time 3151    PT Time Calculation (min) 40 min    Activity Tolerance Patient tolerated treatment well;No increased pain    Behavior During Therapy WFL for tasks assessed/performed           Past Medical History:  Diagnosis Date  . Abscess of lung(513.0)    Right lower lobe  . ADD (attention deficit disorder)   . Anxiety   . Arthritis   . Bipolar depression ([place])   . Empyema lung ([place])   . Hemorrhoids   . Hepatitis C   . Pneumonia    last year  . Prostate abscess   . Prostate troubles     Past Surgical History:  Procedure Laterality Date  . ELBOW SURGERY  2005   Right  . FOOT NEUROMA SURGERY  2006   Left  . HIP RESURFACING     left hip at [facility] 01/2011  . LUMBAR LAMINECTOMY/DECOMPRESSION MICRODISCECTOMY  07/24/2011   Procedure: LUMBAR LAMINECTOMY/DECOMPRESSION MICRODISCECTOMY;  Surgeon: Eustace Moore;  Location: [place] [facility];  Service: Neurosurgery;  Laterality: Bilateral;  Bilateral  , Lumbar Three-Four, Lumbar Four-Five Decompressive Laminectomy Rm # 32  . LUNG SURGERY     for empyema  . SHOULDER ARTHROSCOPY Right 07/05/2013   Procedure: RIGHT SHOULDER ARTHROSCOPY WITH DEBRIDEMENT;  Surgeon: Mcarthur Rossetti, MD;  Location: [place];  Service: Orthopedics;  Laterality: Right;  . TENDON TRANSFER Left 01/26/2014   Procedure: LEFT THUMB TRAPEZIECTOMY KNOTTED TENDON INTRAPOSITION TRANSFER OF ABDUCTOR POLLICIS LONGUS TO [place];  Surgeon: Cammie Sickle, MD;  Location: [place];  Service: Orthopedics;  Laterality:  Left;  . THORACOTOMY  November 14 2010   rt    There were no vitals filed for this visit.   Subjective Assessment - 05/04/20 1638    Subjective Paul Ray reports "40%" improvement in his pain since starting PT.    Pertinent History Previous laminectomy, shoulder arthroscopy, difficulty with getting off pain killers    Limitations Lifting    How long can you sit comfortably? Doesn't sit long    Patient Stated Goals Return to normal physically demanding activities like martial arts.    Currently in Pain? Yes    Pain Score 3     Pain Location Rib cage    Pain Orientation Right    Pain Descriptors / Indicators Aching    Pain Type Chronic pain    Pain Radiating Towards Clavicle    Pain Onset More than a month ago    Pain Frequency Intermittent    Aggravating Factors  Heavy work, playing basketball    Pain Relieving Factors Exercises and rest    Effect of Pain on Daily Activities Starting to return to previous heavy physical demand level    Multiple Pain Sites No              [facility] PT Assessment - 05/04/20 0001      ROM / Strength   AROM / PROM / Strength AROM;Strength      AROM  Overall AROM  Deficits    AROM Assessment Site Cervical    Cervical Extension 70    Cervical - Right Side Bend 20    Cervical - Left Side Bend 20    Cervical - Right Rotation 70    Cervical - Left Rotation 65      Strength   Overall Strength Deficits    Strength Assessment Site Cervical    Cervical Extension 4/5    Cervical - Right Side Bend 5/5    Cervical - Left Side Bend 4/5                         [facility] Adult PT Treatment/Exercise - 05/04/20 0001      Exercises   Exercises Neck      Neck Exercises: Theraband   Scapula Retraction 20 reps   5 seconds shoulder blade pinches   Scapula Retraction Limitations Regular SBP and SBP with shoulder hike 20X 5 seconds each    Rows 20 reps;Blue   Slow eccentrics   Shoulder External Rotation 10 reps;Green   3 seconds slow eccentrics 2  sets   Other Theraband Exercises Palms up extension scapular retraction 10X green slow eccentrics      Neck Exercises: Standing   Other Standing Exercises Cervical Isometrics Extension and lateral bending 10X each 5 seconds    Other Standing Exercises Cervical lateral bending AROM with scapular retraction 10X 5 seconds      Neck Exercises: Prone   Other Prone Exercise Prone opposite arm & leg extensions 2 sets of 10 5 seconds    Other Prone Exercise Prone B arm & leg extensions 10X  5 seconds                   PT Education - 05/04/20 1640    Education Details Reviewed reassessment findings and home exercise program.  Added 1 activity to address cervical lateral bending tightness.    Person(s) Educated Patient    Methods Explanation;Demonstration;Verbal cues    Comprehension Verbalized understanding;Returned demonstration;Verbal cues required               PT Long Term Goals - 05/04/20 1640      PT LONG TERM GOAL #1   Title Paul Ray will report cervical and clavicular pain consistently 0-3/10 on the Numeric Pain Rating Scale.    Baseline Can be 10/10    Time 8    Period Weeks    Status Achieved      PT LONG TERM GOAL #2   Title Paul Ray will be able to return to his martial arts and physically demanding hobbies at [place].    Baseline Out of these activities.    Time 8    Period Weeks    Status On-going      PT LONG TERM GOAL #3   Title Improve cervical AROM for rotation to 60 degrees bilaterally, lateral bending to 30 degrees bilaterally and extension to 65 degrees without pain.    Baseline See objective.    Time 8    Period Weeks    Status Partially Met      PT LONG TERM GOAL #4   Title Improve cervical strength to 5/5 MMT.    Baseline See objective.    Time 8    Period Weeks    Status Partially Met      PT LONG TERM GOAL #5   Title Paul Ray will be independent and compliant with an appropriate  HEP at DC.    Time 8    Period Weeks    Status Achieved                  Plan - 05/04/20 1642    Clinical Impression Statement Paul Ray reports a "40%" improvement in his neck and clavicle pain since starting PT.  AROM is improved, as is strength and self-reported function.  We reviewed Paul Ray's HEP with one update to address cervial lateral bending tightness.  Paul Ray was given the option of DC but chose to attend 1 additional visit to review his program and answer any questions before DC to independent rehabilitation.    Personal Factors and Comorbidities Comorbidity 1    Comorbidities Previous lumbar laminectomy    Examination-Activity Limitations Lift    Examination-Participation Restrictions Community Activity;Other    Stability/Clinical Decision Making Stable/Uncomplicated    Rehab Potential Good    PT Frequency 1x / week    PT Duration 12 weeks    PT Treatment/Interventions ADLs/Self Care Home Management;Cryotherapy;Traction;Therapeutic exercise;Therapeutic activities;Patient/family education;Dry needling    PT Next Visit Plan Postural and cervical strength work along with posture and body mechanics practical work.    PT Home Exercise Plan Access Code: [email or web address], Access Code: [email or web address]. Access Code: [email or web address]    Consulted and Agree with Plan of Care Patient           Patient will benefit from skilled therapeutic intervention in order to improve the following deficits and impairments:  Decreased activity tolerance, Decreased endurance, Decreased safety awareness, Decreased range of motion, Decreased strength, Impaired perceived functional ability, Postural dysfunction, Improper body mechanics, Pain  Visit Diagnosis: Abnormal posture  Muscle weakness (generalized)  Cervicalgia  Rib pain     Problem List Patient Active Problem List   Diagnosis Date Noted  . Psychoactive substance-induced psychosis ([place])   . Rib pain 07/01/2017  . Closed fracture of one rib of left side 07/01/2017  . Chronic right shoulder pain 05/04/2017  .  Osteoarthritis of right hip 12/02/2016  . Impingement syndrome of right shoulder 07/05/2013  . Empyema lung ([place])   . Abscess of lung(513.0)   . Bipolar depression ([place])   . ADD (attention deficit disorder)   . Hepatitis C   . Hip pain 05/02/2011  . History of total hip arthroplasty 04/23/2011  . Lung mass 10/22/2010  . Pleural effusion 10/15/2010    Farley Ly PT, MPT 05/04/2020, 4:45 PM  [facility] Physical Therapy [address] [place], [place], [address] Phone: [phone]   Fax:  [phone]  Name: Paul Ray MRN: [id number] Date of Birth: [birth year]

## 2020-05-07 ENCOUNTER — Ambulatory Visit (INDEPENDENT_AMBULATORY_CARE_PROVIDER_SITE_OTHER): Payer: PPO | Admitting: Rehabilitative and Restorative Service Providers"

## 2020-05-07 ENCOUNTER — Other Ambulatory Visit: Payer: Self-pay

## 2020-05-07 ENCOUNTER — Encounter: Payer: Self-pay | Admitting: Rehabilitative and Restorative Service Providers"

## 2020-05-07 DIAGNOSIS — R293 Abnormal posture: Secondary | ICD-10-CM

## 2020-05-07 DIAGNOSIS — M542 Cervicalgia: Secondary | ICD-10-CM | POA: Diagnosis not present

## 2020-05-07 DIAGNOSIS — M6281 Muscle weakness (generalized): Secondary | ICD-10-CM

## 2020-05-07 NOTE — Therapy (Signed)
[place], [place], [address] Phone: [phone]   Fax:  [phone]  Physical Therapy Treatment/Discharge  Patient Details  Name: Paul Ray MRN: [id number] Date of Birth: 06/24/1956 Referring Provider (PT): Jean Rosenthal MD   Encounter Date: 05/07/2020    Past Medical History:  Diagnosis Date  . Abscess of lung(513.0)    Right lower lobe  . ADD (attention deficit disorder)   . Anxiety   . Arthritis   . Bipolar depression ([place])   . Empyema lung ([place])   . Hemorrhoids   . Hepatitis C   . Pneumonia    last year  . Prostate abscess   . Prostate troubles     Past Surgical History:  Procedure Laterality Date  . ELBOW SURGERY  2005   Right  . FOOT NEUROMA SURGERY  2006   Left  . HIP RESURFACING     left hip at [facility] 01/2011  . LUMBAR LAMINECTOMY/DECOMPRESSION MICRODISCECTOMY  07/24/2011   Procedure: LUMBAR LAMINECTOMY/DECOMPRESSION MICRODISCECTOMY;  Surgeon: Eustace Moore;  Location: [place] [facility];  Service: Neurosurgery;  Laterality: Bilateral;  Bilateral  , Lumbar Three-Four, Lumbar Four-Five Decompressive Laminectomy Rm # 32  . LUNG SURGERY     for empyema  . SHOULDER ARTHROSCOPY Right 07/05/2013   Procedure: RIGHT SHOULDER ARTHROSCOPY WITH DEBRIDEMENT;  Surgeon: Mcarthur Rossetti, MD;  Location: [place];  Service: Orthopedics;  Laterality: Right;  . TENDON TRANSFER Left 01/26/2014   Procedure: LEFT THUMB TRAPEZIECTOMY KNOTTED TENDON INTRAPOSITION TRANSFER OF ABDUCTOR POLLICIS LONGUS TO [place];  Surgeon: Cammie Sickle, MD;  Location: [place];  Service: Orthopedics;  Laterality: Left;  . THORACOTOMY  November 14 2010   rt    There were no vitals filed for this visit.   Subjective Assessment - 05/07/20 1130    Subjective Paul Ray reports "40%" improvement in his pain since starting PT.  He appears ready to transition into independent rehabilitation.    Pertinent History Previous  laminectomy, shoulder arthroscopy, difficulty with getting off pain killers    Limitations Lifting    How long can you sit comfortably? Doesn't sit long    Patient Stated Goals Return to normal physically demanding activities like martial arts.    Pain Onset More than a month ago                             [facility] Adult PT Treatment/Exercise - 05/07/20 0001      Exercises   Exercises Neck      Neck Exercises: Machines for Strengthening   UBE (Upper Arm Bike) Seat 8 Pull only 8 minutes Level 6.5      Neck Exercises: Theraband   Scapula Retraction 20 reps   5 seconds shoulder blade pinches   Scapula Retraction Limitations Regular SBP and SBP with shoulder hike 20X 5 seconds each    Rows 20 reps;Blue   Slow eccentrics   Shoulder External Rotation 10 reps;Green   3 seconds slow eccentrics 2 sets   Other Theraband Exercises Palms up extension scapular retraction 10X green slow eccentrics      Neck Exercises: Standing   Other Standing Exercises Cervical Isometrics Extension and lateral bending 10X each 5 seconds    Other Standing Exercises Cervical lateral bending AROM with scapular retraction 10X 5 seconds      Neck Exercises: Prone   Other Prone Exercise Prone opposite arm & leg extensions 2 sets  of 10 5 seconds    Other Prone Exercise Prone B arm & leg extensions 10X  5 seconds                        PT Long Term Goals - 05/04/20 1640      PT LONG TERM GOAL #1   Title Paul Ray will report cervical and clavicular pain consistently 0-3/10 on the Numeric Pain Rating Scale.    Baseline Can be 10/10    Time 8    Period Weeks    Status Achieved      PT LONG TERM GOAL #2   Title Paul Ray will be able to return to his martial arts and physically demanding hobbies at [place].    Baseline Out of these activities.    Time 8    Period Weeks    Status On-going      PT LONG TERM GOAL #3   Title Improve cervical AROM for rotation to 60 degrees bilaterally, lateral  bending to 30 degrees bilaterally and extension to 65 degrees without pain.    Baseline See objective.    Time 8    Period Weeks    Status Partially Met      PT LONG TERM GOAL #4   Title Improve cervical strength to 5/5 MMT.    Baseline See objective.    Time 8    Period Weeks    Status Partially Met      PT LONG TERM GOAL #5   Title Paul Ray will be independent and compliant with an appropriate HEP at DC.    Time 8    Period Weeks    Status Achieved                  Patient will benefit from skilled therapeutic intervention in order to improve the following deficits and impairments:     Visit Diagnosis: No diagnosis found.     Problem List Patient Active Problem List   Diagnosis Date Noted  . Psychoactive substance-induced psychosis ([place])   . Rib pain 07/01/2017  . Closed fracture of one rib of left side 07/01/2017  . Chronic right shoulder pain 05/04/2017  . Osteoarthritis of right hip 12/02/2016  . Impingement syndrome of right shoulder 07/05/2013  . Empyema lung ([place])   . Abscess of lung(513.0)   . Bipolar depression ([place])   . ADD (attention deficit disorder)   . Hepatitis C   . Hip pain 05/02/2011  . History of total hip arthroplasty 04/23/2011  . Lung mass 10/22/2010  . Pleural effusion 10/15/2010    Farley Ly PT, MPT 05/07/2020, 11:31 AM  [facility] Physical Therapy [address] [place], [place], [address] Phone: [phone]   Fax:  [phone]   PHYSICAL THERAPY DISCHARGE SUMMARY  Visits from Start of Care: 5  Current functional level related to goals / functional outcomes: Improved.  Met most goals, progress towards others.   Remaining deficits: See RA 05/04/2020   Education / Equipment: See summary Plan: Patient agrees to discharge.  Patient goals were partially met. Patient is being discharged due to being pleased with the current functional level.  ?????    Vista Mink PT, MPT   Name: Paul Ray MRN: [id number] Date of Birth: February 19, 1956

## 2020-05-09 ENCOUNTER — Ambulatory Visit
Admission: RE | Admit: 2020-05-09 | Discharge: 2020-05-09 | Disposition: A | Discharge status: Home / Self Care | Payer: PPO | Source: Ambulatory Visit | Attending: Orthopaedic Surgery | Admitting: Orthopaedic Surgery

## 2020-05-09 ENCOUNTER — Other Ambulatory Visit: Payer: Self-pay

## 2020-05-09 ENCOUNTER — Ambulatory Visit: Payer: PPO | Admitting: Orthopaedic Surgery

## 2020-05-09 DIAGNOSIS — M25511 Pain in right shoulder: Secondary | ICD-10-CM

## 2020-05-09 DIAGNOSIS — G8929 Other chronic pain: Secondary | ICD-10-CM

## 2020-05-09 DIAGNOSIS — M19011 Primary osteoarthritis, right shoulder: Secondary | ICD-10-CM | POA: Diagnosis not present

## 2020-05-09 DIAGNOSIS — Z9889 Other specified postprocedural states: Secondary | ICD-10-CM | POA: Diagnosis not present

## 2020-05-10 ENCOUNTER — Ambulatory Visit
Admission: RE | Admit: 2020-05-10 | Discharge: 2020-05-10 | Disposition: A | Discharge status: Home / Self Care | Payer: PPO | Source: Ambulatory Visit | Attending: Orthopaedic Surgery | Admitting: Orthopaedic Surgery

## 2020-05-10 DIAGNOSIS — I712 Thoracic aortic aneurysm, without rupture: Secondary | ICD-10-CM | POA: Diagnosis not present

## 2020-05-10 DIAGNOSIS — S2242XA Multiple fractures of ribs, left side, initial encounter for closed fracture: Secondary | ICD-10-CM | POA: Diagnosis not present

## 2020-05-10 DIAGNOSIS — I700 Atherosclerosis of aorta: Secondary | ICD-10-CM | POA: Diagnosis not present

## 2020-05-10 DIAGNOSIS — J929 Pleural plaque without asbestos: Secondary | ICD-10-CM | POA: Diagnosis not present

## 2020-05-10 DIAGNOSIS — R0782 Intercostal pain: Secondary | ICD-10-CM

## 2020-05-11 ENCOUNTER — Encounter: Payer: PPO | Admitting: Rehabilitative and Restorative Service Providers"

## 2020-05-14 ENCOUNTER — Encounter: Payer: Self-pay | Admitting: Orthopaedic Surgery

## 2020-05-14 ENCOUNTER — Ambulatory Visit (INDEPENDENT_AMBULATORY_CARE_PROVIDER_SITE_OTHER): Payer: PPO | Admitting: Orthopaedic Surgery

## 2020-05-14 VITALS — Ht 67.0 in | Wt 170.0 lb

## 2020-05-14 DIAGNOSIS — R0781 Pleurodynia: Secondary | ICD-10-CM

## 2020-05-14 DIAGNOSIS — G8929 Other chronic pain: Secondary | ICD-10-CM | POA: Diagnosis not present

## 2020-05-14 DIAGNOSIS — M25511 Pain in right shoulder: Secondary | ICD-10-CM | POA: Diagnosis not present

## 2020-05-14 NOTE — Progress Notes (Signed)
Paul Ray comes in today to go over an MRI of his chest as well as his right shoulder.  He has had chronic right shoulder issues and has had an arthroscopic intervention in the past on the right shoulder.  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  He also shoots a lot of basketball.  He hurts mainly with overhead activities.  He is always had good range of motion the shoulder but just a deep aching pain.  He has had a history of rib fractures in the past and had a thoracotomy of his chest back in 2012 due to significant pneumonia.  He said chronic chest pain since then around his ribs.  The chest CT scan does show a thoracic aortic aneurysm that was seen in 2012.  It is slightly larger in size.  The CT scan of his shoulder does show moderate to really severe glenohumeral arthritic changes of the right shoulder joint.  At this point we will send him to thoracic surgery for further evaluation treatment of his thoracic aortic aneurysm and his right-sided chest pain.  His surgery in 2012 was by Dr. Arlyce Dice who is since retired.  From a shoulder standpoint, I would like him to be seen by Dr. Marlou Sa here in the office to discuss the possibility of shoulder replacement surgery.  We will work on making these appointments.  All question concerns were answered and addressed.

## 2020-05-14 NOTE — Addendum Note (Signed)
Addended by: Meyer Cory on: 05/14/2020 04:22 PM   Modules accepted: Orders

## 2020-05-18 ENCOUNTER — Encounter: Payer: PPO | Admitting: Physical Therapy

## 2020-05-24 ENCOUNTER — Ambulatory Visit (INDEPENDENT_AMBULATORY_CARE_PROVIDER_SITE_OTHER): Payer: PPO | Admitting: Orthopedic Surgery

## 2020-05-24 ENCOUNTER — Other Ambulatory Visit: Payer: Self-pay

## 2020-05-24 DIAGNOSIS — M19011 Primary osteoarthritis, right shoulder: Secondary | ICD-10-CM | POA: Diagnosis not present

## 2020-05-24 NOTE — Progress Notes (Signed)
Office Visit Note   Patient: Paul Ray           Date of Birth: 09/24/1955           MRN: [id number] Visit Date: 05/24/2020 Requested by: Shirline Frees, MD [place],  [place] [address] PCP: Shirline Frees, MD  Subjective: Chief Complaint  Patient presents with  . Shoulder Pain    HPI: HURIEL Ray is a 64 y.o. male who presents to the office complaining of shoulder pain.  Patient has a long history of right shoulder pain.  He has been seeing Dr. Ninfa Linden.  He had right shoulder arthroscopy with debridement in 2014 that provided little relief.  He has severe glenohumeral osteoarthritis for which he has been receiving cortisone injections every 3 to 4 months.  They usually provide about 2 to 3 weeks of relief but the last one provided about 1 week.  He wakes up every night 4-5 times per night with severe pain.  He is retired.  His hobbies include remaining active and shooting basketballs on a court but not playing live games.  Is important for him to maintain this hobby as it helps him relax..                ROS: All systems reviewed are negative as they relate to the chief complaint within the history of present illness.  Patient denies fevers or chills.  Assessment & Plan: Visit Diagnoses: No diagnosis found.  Plan: Impression is end-stage right shoulder arthritis with well-maintained function in an active individual.  We talked at length about activities that would be considered advisable in those unadvisable with a total shoulder replacement.  Shooting basketball no problem.  Combat jujitsu which he would like to resume would not be advised.  He has some right-sided chest pain which does not necessarily sound cardiac in origin but he is seeing his cardiologist next week.  I think it well functioning shoulder replacement could improve his quality of life and he is interested in pursuing that after this other work-up is complete.  CT scan reviewed today does show  minimal glenoid deformity and humeral head deformity but end-stage arthritis is present.  Rotator cuff looks intact on exam and on the CT scan but that acromiohumeral distance is slightly narrowed.  Did discuss with him the potential for a change in intraoperative decision making based on the appearance and stability of that rotator cuff particularly the supraspinatus tendon.  He will call us after seeing the cardiologist to let us know if he wants to proceed.  Follow-Up Instructions: No follow-ups on file.   Orders:  No orders of the defined types were placed in this encounter.  No orders of the defined types were placed in this encounter.     Procedures: No procedures performed   Clinical Data: No additional findings.  Objective: Vital Signs: There were no vitals taken for this visit.  Physical Exam:  Constitutional: Patient appears well-developed HEENT:  Head: Normocephalic Eyes:EOM are normal Neck: Normal range of motion Cardiovascular: Normal rate Pulmonary/chest: Effort normal Neurologic: Patient is alert Skin: Skin is warm Psychiatric: Patient has normal mood and affect  Ortho Exam: Ortho exam demonstrates right shoulder with 60 degrees external rotation, 80 degrees abduction, 135 degrees forward flexion.  Excellent strength of the subscapularis.  Mild weakness of the supraspinatus compared with contralateral side as well as the infraspinatus compared with the left side.  Excellent active range of motion that  is equal to the passive range of motion of the shoulder.  Significant crepitus that is consistent with osteoarthritis with passive range of motion of the right shoulder.  5/5 motor strength of the bilateral grip strength, finger abduction, pronation/supination, bicep, tricep, deltoid.  Sensation intact through all dermatomes of bilateral upper extremities.  Specialty Comments:  No specialty comments available.  Imaging: No results found.   PMFS History: Patient  Active Problem List   Diagnosis Date Noted  . Psychoactive substance-induced psychosis ([place])   . Rib pain 07/01/2017  . Closed fracture of one rib of left side 07/01/2017  . Chronic right shoulder pain 05/04/2017  . Osteoarthritis of right hip 12/02/2016  . Impingement syndrome of right shoulder 07/05/2013  . Empyema lung ([place])   . Abscess of lung(513.0)   . Bipolar depression ([place])   . ADD (attention deficit disorder)   . Hepatitis C   . Hip pain 05/02/2011  . History of total hip arthroplasty 04/23/2011  . Lung mass 10/22/2010  . Pleural effusion 10/15/2010   Past Medical History:  Diagnosis Date  . Abscess of lung(513.0)    Right lower lobe  . ADD (attention deficit disorder)   . Anxiety   . Arthritis   . Bipolar depression ([place])   . Empyema lung ([place])   . Hemorrhoids   . Hepatitis C   . Pneumonia    last year  . Prostate abscess   . Prostate troubles     Family History  Problem Relation Age of Onset  . COPD Mother   . Heart disease Mother   . Hypertension Mother   . Coronary artery disease Father   . Dementia Father   . Heart failure Father   . Prostate cancer Paternal Grandfather        also had bone cancer  . Breast cancer Maternal Grandmother   . Cancer Paternal Grandmother        unsure what kind    Past Surgical History:  Procedure Laterality Date  . ELBOW SURGERY  2005   Right  . FOOT NEUROMA SURGERY  2006   Left  . HIP RESURFACING     left hip at [facility] 01/2011  . LUMBAR LAMINECTOMY/DECOMPRESSION MICRODISCECTOMY  07/24/2011   Procedure: LUMBAR LAMINECTOMY/DECOMPRESSION MICRODISCECTOMY;  Surgeon: Eustace Moore;  Location: [facility];  Service: Neurosurgery;  Laterality: Bilateral;  Bilateral  , Lumbar Three-Four, Lumbar Four-Five Decompressive Laminectomy Rm # 32  . LUNG SURGERY     for empyema  . SHOULDER ARTHROSCOPY Right 07/05/2013   Procedure: RIGHT SHOULDER ARTHROSCOPY WITH DEBRIDEMENT;  Surgeon: Mcarthur Rossetti, MD;  Location: [facility];   Service: Orthopedics;  Laterality: Right;  . TENDON TRANSFER Left 01/26/2014   Procedure: LEFT THUMB TRAPEZIECTOMY KNOTTED TENDON INTRAPOSITION TRANSFER OF ABDUCTOR POLLICIS LONGUS TO [place];  Surgeon: Cammie Sickle, MD;  Location: [facility];  Service: Orthopedics;  Laterality: Left;  . THORACOTOMY  November 14 2010   rt   Social History   Occupational History  . Occupation: Games developer  Tobacco Use  . Smoking status: Never Smoker  . Smokeless tobacco: Never Used  Substance and Sexual Activity  . Alcohol use: No    Comment: quit drinking in 1989  . Drug use: No  . Sexual activity: Not on file

## 2020-05-26 ENCOUNTER — Encounter: Payer: Self-pay | Admitting: Orthopedic Surgery

## 2020-05-28 ENCOUNTER — Encounter: Payer: PPO | Admitting: Cardiothoracic Surgery

## 2020-05-29 ENCOUNTER — Encounter: Payer: Self-pay | Admitting: Cardiothoracic Surgery

## 2020-05-29 ENCOUNTER — Other Ambulatory Visit: Payer: Self-pay

## 2020-05-29 ENCOUNTER — Institutional Professional Consult (permissible substitution) (INDEPENDENT_AMBULATORY_CARE_PROVIDER_SITE_OTHER): Payer: PPO | Admitting: Cardiothoracic Surgery

## 2020-05-29 ENCOUNTER — Encounter: Payer: PPO | Admitting: Cardiothoracic Surgery

## 2020-05-29 ENCOUNTER — Other Ambulatory Visit: Payer: Self-pay | Admitting: *Deleted

## 2020-05-29 VITALS — BP 170/86 | HR 80 | Resp 18 | Ht 67.0 in | Wt 169.4 lb

## 2020-05-29 DIAGNOSIS — I712 Thoracic aortic aneurysm, without rupture, unspecified: Secondary | ICD-10-CM

## 2020-05-29 DIAGNOSIS — Q231 Congenital insufficiency of aortic valve: Secondary | ICD-10-CM

## 2020-05-29 NOTE — Progress Notes (Signed)
Paul Ray       [place] [address]             [phone]                    [facility] Medical Record #[id number] Date of Birth: 07-28-1956  Referring: Mcarthur Rossetti* Primary Care: Shirline Frees, MD Primary Cardiologist: No primary care provider on file.  Chief Complaint:    Chief Complaint  Patient presents with  . Thoracic Aortic Aneurysm    CTA chest 05/10/20    History of Present Illness:    Paul Ray 64 y.o. male is seen in the office  today for evaluation of his dilated ascending aorta noted incidentally on CT of the shoulder.  Patient has had chronic right shoulder pain.  On review of old CTs patient has had known dilated ascending aorta since at least 2012-but has had no known follow-up on this and in fact was unaware of the diagnosis.  He has been followed by orthopedics for chronic right shoulder pain.  Since January of this year he notes right anterior chest pain along the right costal margin.  Patient's family history is significant for his mother COPD at age 43 his father at age 21 with dementia stroke and alcohol excess he denies any family history of aortic dissection, aortic aneurysms or sudden death in young family members of unknown causes      Current Activity/ Functional Status:  Patient is independent with mobility/ambulation, transfers, ADL's, IADL's.   Zubrod Score: At the time of surgery this patient's most appropriate activity status/level should be described as: [x]     0    Normal activity, no symptoms []     1    Restricted in physical strenuous activity but ambulatory, able to do out light work []     2    Ambulatory and capable of self care, unable to do work activities, up and about               >50 % of waking hours                              []     3    Only limited self care, in bed greater than 50% of waking hours []     4    Completely disabled, no self care, confined to bed or chair []      5    Moribund   Past Medical History:  Diagnosis Date  . Abscess of lung(513.0)    Right lower lobe  . ADD (attention deficit disorder)   . Anxiety   . Arthritis   . Bipolar depression ([place])   . Empyema lung ([place])   . Hemorrhoids   . Hepatitis C   . Pneumonia    last year  . Prostate abscess   . Prostate troubles     Past Surgical History:  Procedure Laterality Date  . ELBOW SURGERY  2005   Right  . FOOT NEUROMA SURGERY  2006   Left  . HIP RESURFACING     [facility] hip [facility] 01/2011  . LUMBAR LAMINECTOMY/DECOMPRESSION MICRODISCECTOMY  07/24/2011   Procedure: LUMBAR LAMINECTOMY/DECOMPRESSION MICRODISCECTOMY;  Surgeon: Eustace Moore;  Location: [facility];  Service: Neurosurgery;  Laterality: Bilateral;  Bilateral  , Lumbar Three-Four, Lumbar Four-Five Decompressive Laminectomy Rm # 32  . LUNG  SURGERY     for empyema  . SHOULDER ARTHROSCOPY Right 07/05/2013   Procedure: RIGHT SHOULDER ARTHROSCOPY WITH DEBRIDEMENT;  Surgeon: Mcarthur Rossetti, MD;  Location: [facility];  Service: Orthopedics;  Laterality: Right;  . TENDON TRANSFER Left 01/26/2014   Procedure: LEFT THUMB TRAPEZIECTOMY KNOTTED TENDON INTRAPOSITION TRANSFER OF ABDUCTOR POLLICIS LONGUS TO [place];  Surgeon: Cammie Sickle, MD;  Location: [facility];  Service: Orthopedics;  Laterality: Left;  . THORACOTOMY  November 14 2010   rt    Family History  Problem Relation Age of Onset  . COPD Mother   . Heart disease Mother   . Hypertension Mother   . Coronary artery disease Father   . Dementia Father   . Heart failure Father   . Prostate cancer Paternal Grandfather        also had bone cancer  . Breast cancer Maternal Grandmother   . Cancer Paternal Grandmother        unsure what kind     Social History   Tobacco Use  Smoking Status Never Smoker  Smokeless Tobacco Never Used    Social History   Substance and Sexual Activity  Alcohol Use No   Comment: quit drinking in 1989      Allergies  Allergen Reactions  . Quinolones     Patient was warned about not using Cipro and similar antibiotics. Recent studies have raised concern that fluoroquinolone antibiotics could be associated with an increased risk of aortic aneurysm Fluoroquinolones have non-antimicrobial properties that might jeopardise the integrity of the extracellular matrix of the vascular wall In a  propensity score matched cohort study in [place], there was a 66% increased rate of aortic aneurysm or dissection associated with oral fluoroquinolone use, compared wit    Current Outpatient Medications  Medication Sig Dispense Refill  . alprazolam (XANAX) 2 MG tablet Take 2 mg by mouth 2 (two) times daily as needed for anxiety.    Marland Kitchen amphetamine-dextroamphetamine (ADDERALL XR) 30 MG 24 hr capsule Take 20 mg by mouth daily.     Marland Kitchen lamoTRIgine (LAMICTAL) 200 MG tablet Take 200 mg by mouth at bedtime.    Marland Kitchen MAGNESIUM PO Take 1 tablet by mouth daily.    . tamsulosin (FLOMAX) 0.4 MG CAPS capsule Take by mouth.     No current facility-administered medications for this visit.    Pertinent items are noted in HPI.   Review of Systems:     Cardiac Review of Systems: [Y] = yes  or   [ N ] = no   Chest Pain [  Right rib  ]  Resting SOB [  n ] Exertional SOB  [ n ]  Orthopnea [ n ]   Pedal Edema [ n  ]    Palpitations [ n ] Syncope  [ n ]   Presyncope [  n ]   General Review of Systems: [Y] = yes [  ]=no Constitional: recent weight change [  ];  Wt loss over the last 3 months [   ] anorexia [  ]; fatigue [  ]; nausea [  ]; night sweats [  ]; fever [  ]; or chills [  ];           Eye : blurred vision [  ]; diplopia [   ]; vision changes [  ];  Amaurosis fugax[  ]; Resp: cough [ y ];  wheezing[  ];  hemoptysis[  ]; shortness of breath[  ];  paroxysmal nocturnal dyspnea[  ]; dyspnea on exertion[  ]; or orthopnea[  ];  GI:  gallstones[  ], vomiting[  ];  dysphagia[  ]; melena[  ];  hematochezia [  ]; heartburn[  ];   Hx  of  Colonoscopy[  ]; GU: kidney stones [  ]; hematuria[  ];   dysuria [  ];  nocturia[  ];  history of     obstruction [  ]; urinary frequency [  ]             Skin: rash, swelling[  ];, hair loss[  ];  peripheral edema[  ];  or itching[  ]; Musculosketetal: myalgias[  ];  joint swelling[  ];  joint erythema[  ];  joint pain[  ];  back pain[  ];rib pain  Heme/Lymph: bruising[  ];  bleeding[  ];  anemia[  ];  Neuro: TIA[  ];  headaches[  ];  stroke[  ];  vertigo[  ];  seizures[  ];   paresthesias[  ];  difficulty walking[  ];  Psych:depression[  ]; anxiety[  ];  Endocrine: diabetes[  ];  thyroid dysfunction[  ];  Immunizations: Flu up to date [  ]; Pneumococcal up to date [  ]; COVID-86 vacination completed  [ ?  ]  Other:     PHYSICAL EXAMINATION: BP (!) 170/86 (BP Location: Right Arm, Patient Position: Sitting)   Pulse 80   Resp 18   Ht 5\' 7"  (1.702 m)   Wt 169 lb 6.4 oz (76.8 kg)   SpO2 99% Comment: Ra with mask on  BMI 26.53 kg/m  General appearance: alert, cooperative and no distress Head: Normocephalic, without obvious abnormality, atraumatic Neck: no adenopathy, no carotid bruit, no JVD, supple, symmetrical, trachea midline and thyroid not enlarged, symmetric, no tenderness/mass/nodules Lymph nodes: Cervical, supraclavicular, and axillary nodes normal. Resp: clear to auscultation bilaterally Cardio: regular rate and rhythm, S1, S2 normal, no murmur, click, rub or gallop GI: soft, non-tender; bowel sounds normal; no masses,  no organomegaly Extremities: extremities normal, atraumatic, no cyanosis or edema Neurologic: Grossly normal No palpable popliteal or abdominal aneurysms appreciated on physical exam Palpable DP and PT pulses bilaterally  Diagnostic Studies & Laboratory data:     Recent Radiology Findings:   CT CHEST WO CONTRAST  Result Date: 05/11/2020 CLINICAL DATA:  Right anterior rib pain.  History of thoracotomy. EXAM: CT CHEST WITHOUT CONTRAST TECHNIQUE:  Multidetector CT imaging of the chest was performed following the standard protocol without IV contrast. COMPARISON:  October 18, 2010 FINDINGS: Cardiovascular: There is a thoracic aortic aneurysm involving the ascending aorta measuring approximately 4.9 cm (previously measuring 4.6 cm in 2012). The heart size is unremarkable. There are mild atherosclerotic changes of the thoracic aorta. Mediastinum/Nodes: --mild mediastinal adenopathy is noted. This is very similar to prior study in 2012. --minimal hilar adenopathy is noted. -- No axillary lymphadenopathy. -- No supraclavicular lymphadenopathy. -- Normal thyroid gland where visualized. -  Unremarkable esophagus. Lungs/Pleura: There is scarring versus atelectasis in the right lower lobe. The trachea is unremarkable. There is no pneumothorax or pleural effusion. There is chronic appearing pleural thickening on the right. Upper Abdomen: There is a large amount of stool in the partially visualized colon. Otherwise, there is no acute abnormality upper abdomen. Musculoskeletal: No chest wall abnormality. No bony spinal canal stenosis. There are old healed left-sided rib fractures. IMPRESSION: 1. No acute abnormality.  No acute displaced rib fracture. 2. Thoracic aortic aneurysm involving the ascending aorta  measuring approximately 4.9 cm (previously measuring 4.6 cm in 2012). Recommend semi-annual imaging followup by CTA or MRA and referral to cardiothoracic surgery if not already obtained. This recommendation follows 2010 ACCF/AHA/AATS/ACR/ASA/SCA/SCAI/SIR/STS/SVM Guidelines for the Diagnosis and Management of Patients With Thoracic Aortic Disease. Circulation. 2010; 121: U725-D664. Aortic aneurysm NOS (ICD10-I71.9) 3. Scarring versus atelectasis in the right lower lobe. 4. Large amount of stool in the partially visualized colon. Aortic Atherosclerosis (ICD10-I70.0). Electronically Signed   By: Constance Holster M.D.   On: 05/11/2020 22:09   CT SHOULDER RIGHT WO  CONTRAST  Result Date: 05/10/2020 CLINICAL DATA:  Chronic right shoulder pain. EXAM: CT OF THE UPPER RIGHT EXTREMITY WITHOUT CONTRAST TECHNIQUE: Multidetector CT imaging of the right shoulder was performed according to the standard protocol. COMPARISON:  CT right shoulder dated February 09, 2019. FINDINGS: Bones/Joint/Cartilage No fracture or dislocation. No joint effusion. Unchanged moderate to severe osteoarthritis of the glenohumeral joint with joint space narrowing, subchondral sclerosis, subchondral cystic changes and marginal osteophytosis. Unchanged intra-articular bodies in the subcoracoid recess. Similar postsurgical appearance of the acromioclavicular joint. Ligaments Ligaments are suboptimally evaluated by CT. Muscles and Tendons Grossly intact.  No muscle atrophy. Soft tissue No fluid collection or hematoma. No soft tissue mass. 4.7 cm ascending thoracic aortic aneurysm, previously 4.6 cm in 2012. 5.0 cm measurement on the prior CT shoulder study was likely accentuated by motion artifact. Partially visualized scarring in the right lower lobe. Visualized lungs are otherwise clear IMPRESSION: 1. Unchanged moderate to severe right glenohumeral osteoarthritis. 2. 4.7 cm ascending thoracic aortic aneurysm, previously 4.6 cm in 2012. 5.0 cm measurement on the prior CT shoulder study was likely accentuated by motion artifact. Recommend semi-annual imaging followup by CTA or MRA and referral to cardiothoracic surgery if not already obtained. This recommendation follows 2010 ACCF/AHA/AATS/ACR/ASA/SCA/SCAI/SIR/STS/SVM Guidelines for the Diagnosis and Management of Patients With Thoracic Aortic Disease. Circulation. 2010; 121: Q034-V425. Aortic aneurysm NOS (ICD10-I71.9) Electronically Signed   By: Titus Dubin M.D.   On: 05/10/2020 09:15    IN 2012: CT RADIOLOGY REPORT*   Clinical Data: Possible mass on recent chest radiograph. Right-  sided chest pain.   CT CHEST WITHOUT CONTRAST   Technique:  Multidetector CT imaging of the chest was performed  following the standard protocol without IV contrast.   Comparison: Chest radiograph 09/24/2010   Findings: No pathologically enlarged mediastinal or axillary lymph  nodes. Hilar regions are difficult to definitively evaluate  without IV contrast. Ascending aorta measures approximately 5.1 x  4.5 cm. Transverse and descending thoracic aorta are normal in  caliber. Heart size normal. No pericardial effusion.   Rounded air space consolidation is seen in the superior segment  right lower lobe, abutting the major fissure. There is surrounding  inflammatory ground-glass. Small to moderate right pleural  effusion is simple in appearance. Airway is unremarkable.   Incidental imaging of the upper abdomen shows small juxta  diaphragmatic lymph nodes. No worrisome lytic or sclerotic  lesions.   IMPRESSION:   1. Rounded air space consolidation in the right lower lobe is  suspicious for pneumonia, especially given the surrounding  inflammatory ground-glass. Follow-up to clearing is recommended,  to exclude the possibility of malignancy.  2. Ascending thoracic aortic aneurysm.  3. Small to moderate right pleural effusion.   Original Report Authenticated By: Luretha Rued, M  Ct scans are without contrast I have independently reviewed the above radiology studies  and reviewed the findings with the patient.   Recent Lab Findings: Lab Results  Component Value Date   WBC 12.2 (H) 06/24/2019   HGB 14.1 06/24/2019   HCT 42.7 06/24/2019   PLT 197 06/24/2019   GLUCOSE 153 (H) 06/24/2019   ALT 13 06/24/2019   AST 27 06/24/2019   NA 138 06/24/2019   K 3.2 (L) 06/24/2019   CL 101 06/24/2019   CREATININE 0.73 06/24/2019   BUN 18 06/24/2019   CO2 25 06/24/2019   INR 0.99 07/01/2013   Aortic Size Index=     4.7    /Body surface area is 1.91 meters squared. = 2.4   < 2.75 cm/m2      4% risk per year 2.75 to 4.25           8% risk per year > 4.25 cm/m2    20% risk per year  Aortic Cross section area/ Height ratio=    Assessment / Plan:   #1 dilated ascending aortic aneurysm, from noncontrasted CT scans appears to be present since 2012 with little change in size the patient's had no evaluation of this, no previous echo , is noted to be hypertensive in the office today-patient needs a contrasted CTA of the chest to fully evaluate the aortic size accurately, we'll also obtain an echocardiogram to determine size of the aortic root, and morphology of the aortic valve.  Patient was given printed material concerning dilated ascending aorta avoiding strenuous lifting, avoiding quinolone antibiotics, and the need for good blood sugar blood pressure control and cardiology and cardiac surgery follow-up-we'll refer to cardiology,  #2 previous right thoracotomy by Dr. Arlyce Dice in 2012 for inflammatory mass is of note malignancy  #3 right anterior rib pain costal margin patient notes this began in January 2021-there are no physical findings or CT findings to suggest etiology  #4 chronic right shoulder pain followed by orthopedics    I reviewed the patient's diagnosis, reviewed CT scans with him, recommend establishing cardiology care, obtain CTA of the chest and echocardiogram.   I  spent 60 minutes with  the patient face to face and greater then 50% of the time was spent in counseling and coordination of care.    [name] MD      [address] [place] [address] Office [phone]     05/29/2020 3:04 PM

## 2020-05-29 NOTE — Patient Instructions (Signed)

## 2020-05-29 NOTE — Progress Notes (Deleted)
[address]       [place] [address]             [phone]                    Paul Ray Medical Record #[id number] Date of Birth: 03-Jun-1956  Referring: Mcarthur Rossetti* Primary Care: Shirline Frees, MD Primary Cardiologist: No primary care provider on file.  Chief Complaint:   No chief complaint on file.   History of Present Illness:    Paul Ray 64 y.o. male is seen in the office  today for     CT 2012: Findings: No pathologically enlarged mediastinal or axillary lymph  nodes. Hilar regions are difficult to definitively evaluate  without IV contrast. Ascending aorta measures approximately 5.1 x  4.5 cm. Transverse and descending thoracic aorta are normal in  caliber. Heart size normal. No pericardial effusion.   Current Activity/ Functional Status:  {functional status:19517}   Zubrod Score: At the time of surgery this patient's most appropriate activity status/level should be described as: []     0    Normal activity, no symptoms []     1    Restricted in physical strenuous activity but ambulatory, able to do out light work []     2    Ambulatory and capable of self care, unable to do work activities, up and about               >50 % of waking hours                              []     3    Only limited self care, in bed greater than 50% of waking hours []     4    Completely disabled, no self care, confined to bed or chair []     5    Moribund   Past Medical History:  Diagnosis Date  . Abscess of lung(513.0)    Right lower lobe  . ADD (attention deficit disorder)   . Anxiety   . Arthritis   . Bipolar depression ([place])   . Empyema lung ([place])   . Hemorrhoids   . Hepatitis C   . Pneumonia    last year  . Prostate abscess   . Prostate troubles     Past Surgical History:  Procedure Laterality Date  . ELBOW SURGERY  2005   Right  . FOOT NEUROMA SURGERY  2006   Left  . HIP RESURFACING     left hip at [facility]  01/2011  . LUMBAR LAMINECTOMY/DECOMPRESSION MICRODISCECTOMY  07/24/2011   Procedure: LUMBAR LAMINECTOMY/DECOMPRESSION MICRODISCECTOMY;  Surgeon: Eustace Moore;  Location: [facility];  Service: Neurosurgery;  Laterality: Bilateral;  Bilateral  , Lumbar Three-Four, Lumbar Four-Five Decompressive Laminectomy Rm # 32  . LUNG SURGERY     for empyema  . SHOULDER ARTHROSCOPY Right 07/05/2013   Procedure: RIGHT SHOULDER ARTHROSCOPY WITH DEBRIDEMENT;  Surgeon: Mcarthur Rossetti, MD;  Location: [facility];  Service: Orthopedics;  Laterality: Right;  . TENDON TRANSFER Left 01/26/2014   Procedure: LEFT THUMB TRAPEZIECTOMY KNOTTED TENDON INTRAPOSITION TRANSFER OF ABDUCTOR POLLICIS LONGUS TO [place];  Surgeon: Cammie Sickle, MD;  Location: [facility];  Service: Orthopedics;  Laterality: Left;  . THORACOTOMY  November 14 2010   rt    Family History  Problem Relation Age  of Onset  . COPD Mother   . Heart disease Mother   . Hypertension Mother   . Coronary artery disease Father   . Dementia Father   . Heart failure Father   . Prostate cancer Paternal Grandfather        also had bone cancer  . Breast cancer Maternal Grandmother   . Cancer Paternal Grandmother        unsure what kind     Social History   Tobacco Use  Smoking Status Never Smoker  Smokeless Tobacco Never Used    Social History   Substance and Sexual Activity  Alcohol Use No   Comment: quit drinking in 1989     No Known Allergies  Current Outpatient Medications  Medication Sig Dispense Refill  . alprazolam (XANAX) 2 MG tablet Take 2 mg by mouth 2 (two) times daily as needed for anxiety.    Marland Kitchen amphetamine-dextroamphetamine (ADDERALL XR) 30 MG 24 hr capsule Take 60 mg by mouth daily.     . clindamycin (CLEOCIN) 300 MG capsule Take 1 capsule (300 mg total) by mouth 3 (three) times daily. 30 capsule 0  . doxycycline (VIBRAMYCIN) 100 MG capsule Take 1 capsule (100 mg total) by mouth 2 (two) times daily. 20  capsule 0  . FLUoxetine (PROZAC) 20 MG capsule Take 20 mg by mouth daily.    Marland Kitchen HYDROmorphone (DILAUDID) 4 MG tablet Take 4 mg by mouth every 4 (four) hours.   0  . lamoTRIgine (LAMICTAL) 200 MG tablet Take 200 mg by mouth at bedtime.    Marland Kitchen MAGNESIUM PO Take 1 tablet by mouth daily.    Marland Kitchen morphine (MS CONTIN) 30 MG 12 hr tablet Take 60 mg by mouth 2 (two) times daily.   0  . mupirocin ointment (BACTROBAN) 2 % Place 1 application into the nose 2 (two) times daily. 22 g 0  . tamsulosin (FLOMAX) 0.4 MG CAPS capsule Take by mouth.     No current facility-administered medications for this visit.    {Ros - complete:30496}   Review of Systems:     Cardiac Review of Systems: [Y] = yes  or   [ N ] = no   Chest Pain [    ]  Resting SOB [   ] Exertional SOB  [  ]  Orthopnea [  ]   Pedal Edema [   ]    Palpitations [  ] Syncope  [  ]   Presyncope [   ]   General Review of Systems: [Y] = yes [  ]=no Constitional: recent weight change [  ];  Wt loss over the last 3 months [   ] anorexia [  ]; fatigue [  ]; nausea [  ]; night sweats [  ]; fever [  ]; or chills [  ];           Eye : blurred vision [  ]; diplopia [   ]; vision changes [  ];  Amaurosis fugax[  ]; Resp: cough [  ];  wheezing[  ];  hemoptysis[  ]; shortness of breath[  ]; paroxysmal nocturnal dyspnea[  ]; dyspnea on exertion[  ]; or orthopnea[  ];  GI:  gallstones[  ], vomiting[  ];  dysphagia[  ]; melena[  ];  hematochezia [  ]; heartburn[  ];   Hx of  Colonoscopy[  ]; GU: kidney stones [  ]; hematuria[  ];   dysuria [  ];  nocturia[  ];  history of     obstruction [  ]; urinary frequency [  ]             Skin: rash, swelling[  ];, hair loss[  ];  peripheral edema[  ];  or itching[  ]; Musculosketetal: myalgias[  ];  joint swelling[  ];  joint erythema[  ];  joint pain[  ];  back pain[  ];  Heme/Lymph: bruising[  ];  bleeding[  ];  anemia[  ];  Neuro: TIA[  ];  headaches[  ];  stroke[  ];  vertigo[  ];  seizures[  ];   paresthesias[  ];   difficulty walking[  ];  Psych:depression[  ]; anxiety[  ];  Endocrine: diabetes[  ];  thyroid dysfunction[  ];  Immunizations: Flu up to date [  ]; Pneumococcal up to date [  ]; COVID-40 vacination completed  [   ]  Other:     PHYSICAL EXAMINATION: There were no vitals taken for this visit. {physical exam:21449}  Diagnostic Studies & Laboratory data:     Recent Radiology Findings:   CT CHEST WO CONTRAST  Result Date: 05/11/2020 CLINICAL DATA:  Right anterior rib pain.  History of thoracotomy. EXAM: CT CHEST WITHOUT CONTRAST TECHNIQUE: Multidetector CT imaging of the chest was performed following the standard protocol without IV contrast. COMPARISON:  October 18, 2010 FINDINGS: Cardiovascular: There is a thoracic aortic aneurysm involving the ascending aorta measuring approximately 4.9 cm (previously measuring 4.6 cm in 2012). The heart size is unremarkable. There are mild atherosclerotic changes of the thoracic aorta. Mediastinum/Nodes: --mild mediastinal adenopathy is noted. This is very similar to prior study in 2012. --minimal hilar adenopathy is noted. -- No axillary lymphadenopathy. -- No supraclavicular lymphadenopathy. -- Normal thyroid gland where visualized. -  Unremarkable esophagus. Lungs/Pleura: There is scarring versus atelectasis in the right lower lobe. The trachea is unremarkable. There is no pneumothorax or pleural effusion. There is chronic appearing pleural thickening on the right. Upper Abdomen: There is a large amount of stool in the partially visualized colon. Otherwise, there is no acute abnormality upper abdomen. Musculoskeletal: No chest wall abnormality. No bony spinal canal stenosis. There are old healed left-sided rib fractures. IMPRESSION: 1. No acute abnormality.  No acute displaced rib fracture. 2. Thoracic aortic aneurysm involving the ascending aorta measuring approximately 4.9 cm (previously measuring 4.6 cm in 2012). Recommend semi-annual imaging followup by CTA  or MRA and referral to cardiothoracic surgery if not already obtained. This recommendation follows 2010 ACCF/AHA/AATS/ACR/ASA/SCA/SCAI/SIR/STS/SVM Guidelines for the Diagnosis and Management of Patients With Thoracic Aortic Disease. Circulation. 2010; 121: I627-O350. Aortic aneurysm NOS (ICD10-I71.9) 3. Scarring versus atelectasis in the right lower lobe. 4. Large amount of stool in the partially visualized colon. Aortic Atherosclerosis (ICD10-I70.0). Electronically Signed   By: Constance Holster M.D.   On: 05/11/2020 22:09   CT SHOULDER RIGHT WO CONTRAST  Result Date: 05/10/2020 CLINICAL DATA:  Chronic right shoulder pain. EXAM: CT OF THE UPPER RIGHT EXTREMITY WITHOUT CONTRAST TECHNIQUE: Multidetector CT imaging of the right shoulder was performed according to the standard protocol. COMPARISON:  CT right shoulder dated February 09, 2019. FINDINGS: Bones/Joint/Cartilage No fracture or dislocation. No joint effusion. Unchanged moderate to severe osteoarthritis of the glenohumeral joint with joint space narrowing, subchondral sclerosis, subchondral cystic changes and marginal osteophytosis. Unchanged intra-articular bodies in the subcoracoid recess. Similar postsurgical appearance of the acromioclavicular joint. Ligaments Ligaments are suboptimally evaluated by CT. Muscles and Tendons Grossly intact.  No muscle atrophy. Soft tissue No  fluid collection or hematoma. No soft tissue mass. 4.7 cm ascending thoracic aortic aneurysm, previously 4.6 cm in 2012. 5.0 cm measurement on the prior CT shoulder study was likely accentuated by motion artifact. Partially visualized scarring in the right lower lobe. Visualized lungs are otherwise clear IMPRESSION: 1. Unchanged moderate to severe right glenohumeral osteoarthritis. 2. 4.7 cm ascending thoracic aortic aneurysm, previously 4.6 cm in 2012. 5.0 cm measurement on the prior CT shoulder study was likely accentuated by motion artifact. Recommend semi-annual imaging followup by  CTA or MRA and referral to cardiothoracic surgery if not already obtained. This recommendation follows 2010 ACCF/AHA/AATS/ACR/ASA/SCA/SCAI/SIR/STS/SVM Guidelines for the Diagnosis and Management of Patients With Thoracic Aortic Disease. Circulation. 2010; 121: Z563-O756. Aortic aneurysm NOS (ICD10-I71.9) Electronically Signed   By: Titus Dubin M.D.   On: 05/10/2020 09:15    *RADIOLOGY REPORT*   Clinical Data: Evaluate pleural effusion. Status post  thoracentesis.   CT CHEST WITHOUT CONTRAST   Technique: Multidetector CT imaging of the chest was performed  following the standard protocol without IV contrast.   Comparison: 09/25/2010   Findings:  No enlarged axillary or supraclavicular lymph nodes. No  pathologically enlarged mediastinal or hilar lymph nodes. There is  no pericardial effusion. Decrease in volume of right effusion  after thoracentesis. Right lower lobe mass-like consolidation  measures 5.5 x 3.8 cm. Previously this measured  with 4.3 x 4.7 cm. Improvement in surrounding ground-glass  attenuation from previous exam.   No new findings are identified.   Review of the visualized osseous structures reveals no suspicious  bony abnormalities.   No aggressive bone lesions identified.   IMPRESSION:   1. Decrease in right pleural effusion status post thoracentesis.  2. Persistent mass - like airspace consolidation within the right  lower lobe. Is not significantly improved in size from 09/25/2010.  Differential considerations include resolved pulmonary infection  versus malignancy. Correlation with results from thoracentesis  recommended. Continued follow-up is advised. If this does not  improve then PET CT and biopsy is recommended.   Original Report Authenticated By: Angelita Ingles, M.D. I have independently reviewed the above radiology studies  and reviewed the findings with the patient.   Recent Lab Findings: Lab Results  Component Value Date     WBC 12.2 (H) 06/24/2019   HGB 14.1 06/24/2019   HCT 42.7 06/24/2019   PLT 197 06/24/2019   GLUCOSE 153 (H) 06/24/2019   ALT 13 06/24/2019   AST 27 06/24/2019   NA 138 06/24/2019   K 3.2 (L) 06/24/2019   CL 101 06/24/2019   CREATININE 0.73 06/24/2019   BUN 18 06/24/2019   CO2 25 06/24/2019   INR 0.99 07/01/2013      Assessment / Plan:        I  spent {CHL ONC TIME VISIT - EPPIR:[id number]} with  the patient face to face and greater then 50% of the time was spent in counseling and coordination of care.    [name] MD      [address] [place] [address] Office [phone]     05/29/2020 7:24 AM

## 2020-05-30 ENCOUNTER — Telehealth: Payer: Self-pay | Admitting: Nurse Practitioner

## 2020-05-30 NOTE — Telephone Encounter (Signed)
    I went in pt's chart to see who called him yesterday. It was Edmon Crape, call was transferred to her voicemail.

## 2020-06-05 ENCOUNTER — Ambulatory Visit (HOSPITAL_COMMUNITY): Payer: PPO | Attending: Cardiology

## 2020-06-05 ENCOUNTER — Other Ambulatory Visit: Payer: Self-pay

## 2020-06-05 DIAGNOSIS — Q231 Congenital insufficiency of aortic valve: Secondary | ICD-10-CM | POA: Diagnosis not present

## 2020-06-05 DIAGNOSIS — I712 Thoracic aortic aneurysm, without rupture, unspecified: Secondary | ICD-10-CM

## 2020-06-05 LAB — ECHOCARDIOGRAM COMPLETE
Area-P 1/2: 2.48 cm2
S' Lateral: 3 cm

## 2020-06-13 ENCOUNTER — Ambulatory Visit
Admission: RE | Admit: 2020-06-13 | Discharge: 2020-06-13 | Disposition: A | Discharge status: Home / Self Care | Payer: PPO | Source: Ambulatory Visit | Attending: Cardiothoracic Surgery | Admitting: Cardiothoracic Surgery

## 2020-06-13 DIAGNOSIS — I712 Thoracic aortic aneurysm, without rupture, unspecified: Secondary | ICD-10-CM

## 2020-06-13 DIAGNOSIS — J9811 Atelectasis: Secondary | ICD-10-CM | POA: Diagnosis not present

## 2020-06-13 DIAGNOSIS — J3489 Other specified disorders of nose and nasal sinuses: Secondary | ICD-10-CM | POA: Diagnosis not present

## 2020-06-13 DIAGNOSIS — I2510 Atherosclerotic heart disease of native coronary artery without angina pectoris: Secondary | ICD-10-CM | POA: Diagnosis not present

## 2020-06-13 MED ORDER — IOPAMIDOL (ISOVUE-370) INJECTION 76%
75.0000 mL | Freq: Once | INTRAVENOUS | Status: AC | PRN
  Administered 2020-06-13: 75 mL via INTRAVENOUS

## 2020-06-14 ENCOUNTER — Inpatient Hospital Stay: Admission: RE | Admit: 2020-06-14 | Payer: PPO | Source: Ambulatory Visit

## 2020-06-14 NOTE — Progress Notes (Addendum)
Cardiology Office Note    Date:  06/15/2020   ID:  Paul Ray, DOB 1955-08-19, MRN [id number]  PCP:  Paul Frees, MD  Cardiologist:  Paul Him, MD   Chief Complaint  Patient presents with  . New Patient (Initial Visit)    aortic anerusym, elevated BP,     History of Present Illness:  Paul Ray is a 64 y.o. male who is being seen today for the evaluation of HTN at the request of Paul Isaac, MD.  This is a 64yo male with a hx of arthritis, ADD, anxiety and ascending aortic aneurysm that was incidentally noted on a CT of his shoulder.  Patient has had chronic right shoulder pain. He has had known dilated ascending aorta since at least 2012-but has had no known follow-up on this and in fact was unaware of the diagnosis.  He has been followed by orthopedics for chronic right shoulder pain.  Since January of this year he notes right anterior chest pain along the right costal margin. He has a hx of right thoracotomy by Dr. Arlyce Ray in 2012 for an inflammatory mass>nonmalignant.    Patient's family history is significant for his mother COPD at age 63 his father at age 69 with dementia stroke and alcohol excess he denies any family history of aortic dissection, aortic aneurysms or sudden death in young family members of unknown causes.  He was seen by Dr. Servando Ray with CVTS surgery who reviewed his CTs and felt there had not been any significant change in size since 2012.    He is now referred to Cardiology for evaluation and treatment of HTN.  He had a chest CTA done this week showing a moderate sized ascending aortic aneurysm measuring 4.7cm and coronary atherosclerosis. 2D echo 11/9/20201 showed moderate ascending aortic aneurysm measuring 27mm, normal LVF EF 60-65% with G1DD, mild MR and normal appearing AV with trivial AI.    He is here today  and is doing well.  He tells me that he has had problems with pain over the right rib cage in his chest below his breast that he  feels like he has been hit there by a hammer.  He notices it at night and when he wakes up and once he is up moving around in the day it goes away.  He also has been coughing up green phlegm for a while that has persisted despite 3 rounds of antibx.  He has an appt soon to see Pulmonary.  The CP feels like a bruise.  He denies any  SOB, DOE, PND, orthopnea, LE edema, dizziness, palpitations or syncope. He is compliant with his meds and is tolerating meds with no SE.     Past Medical History:  Diagnosis Date  . Abscess of lung(513.0)    Right lower lobe  . ADD (attention deficit disorder)   . Anxiety   . Arthritis   . Ascending aortic aneurysm ([place])    72mm by Chest CTA 05/2020 and 14mm by echo 05/2020  . Benign essential HTN   . Bipolar depression ([place])   . Coronary artery calcification seen on CAT scan   . Empyema lung ([place])   . Hemorrhoids   . Hepatitis C   . Pneumonia    last year  . Prostate abscess   . Prostate troubles     Past Surgical History:  Procedure Laterality Date  . ELBOW SURGERY  2005   Right  . FOOT NEUROMA SURGERY  2006  Left  . HIP RESURFACING     left hip at [facility] 01/2011  . LUMBAR LAMINECTOMY/DECOMPRESSION MICRODISCECTOMY  07/24/2011   Procedure: LUMBAR LAMINECTOMY/DECOMPRESSION MICRODISCECTOMY;  Surgeon: Paul Ray;  Location: [facility];  Service: Neurosurgery;  Laterality: Bilateral;  Bilateral  , Lumbar Three-Four, Lumbar Four-Five Decompressive Laminectomy Rm # 32  . LUNG SURGERY     for empyema  . SHOULDER ARTHROSCOPY Right 07/05/2013   Procedure: RIGHT SHOULDER ARTHROSCOPY WITH DEBRIDEMENT;  Surgeon: Paul Rossetti, MD;  Location: [facility];  Service: Orthopedics;  Laterality: Right;  . TENDON TRANSFER Left 01/26/2014   Procedure: LEFT THUMB TRAPEZIECTOMY KNOTTED TENDON INTRAPOSITION TRANSFER OF ABDUCTOR POLLICIS LONGUS TO [place];  Surgeon: Paul Sickle, MD;  Location: [facility];  Service: Orthopedics;  Laterality: Left;   . THORACOTOMY  November 14 2010   rt    Current Medications: Current Meds  Medication Sig  . alprazolam (XANAX) 2 MG tablet Take 2 mg by mouth 2 (two) times daily as needed for anxiety.  Marland Kitchen amphetamine-dextroamphetamine (ADDERALL XR) 30 MG 24 hr capsule Take 20 mg by mouth daily.   Marland Kitchen lamoTRIgine (LAMICTAL) 200 MG tablet Take 200 mg by mouth at bedtime.  Marland Kitchen MAGNESIUM PO Take 1 tablet by mouth daily.  . tamsulosin (FLOMAX) 0.4 MG CAPS capsule Take by mouth.    Allergies:   Quinolones   Social History   Socioeconomic History  . Marital status: Single    Spouse name: Not on file  . Number of children: 2  . Years of education: Not on file  . Highest education level: Not on file  Occupational History  . Occupation: Games developer  Tobacco Use  . Smoking status: Never Smoker  . Smokeless tobacco: Never Used  Substance and Sexual Activity  . Alcohol use: No    Comment: quit drinking in 1989  . Drug use: No  . Sexual activity: Not on file  Other Topics Concern  . Not on file  Social History Narrative   2 children: Paul Ray and Paul Ray   Social Determinants of Health   Financial Resource Strain:   . Difficulty of Paying Living Expenses: Not on file  Food Insecurity:   . Worried About Charity fundraiser in the Last Year: Not on file  . Ran Out of Food in the Last Year: Not on file  Transportation Needs:   . Lack of Transportation (Medical): Not on file  . Lack of Transportation (Non-Medical): Not on file  Physical Activity:   . Days of Exercise per Week: Not on file  . Minutes of Exercise per Session: Not on file  Stress:   . Feeling of Stress : Not on file  Social Connections:   . Frequency of Communication with Friends and Family: Not on file  . Frequency of Social Gatherings with Friends and Family: Not on file  . Attends Religious Services: Not on file  . Active Member of Clubs or Organizations: Not on file  . Attends Archivist Meetings: Not on file  . Marital  Status: Not on file     Family History:  The patient's family history includes Breast cancer in his maternal grandmother; COPD in his mother; Cancer in his paternal grandmother; Coronary artery disease in his father; Dementia in his father; Heart disease in his mother; Heart failure in his father; Hypertension in his mother; Prostate cancer in his paternal grandfather.   ROS:   Please see the history of present illness.  ROS All other systems reviewed and are negative.  PAD Screen 06/15/2020  Previous PAD dx? No  Previous surgical procedure? No  Pain with walking? No  Feet/toe relief with dangling? No  Painful, non-healing ulcers? No  Extremities discolored? No       PHYSICAL EXAM:   VS:  BP 134/78   Pulse 81   Ht 5\' 7"  (1.702 m)   Wt 163 lb 9.6 oz (74.2 kg)   SpO2 99%   BMI 25.62 kg/m    GEN: Well nourished, well developed, in no acute distress  HEENT: normal  Neck: no JVD, carotid bruits, or masses Cardiac: RRR; no murmurs, rubs, or gallops,no edema.  Intact distal pulses bilaterally.  Respiratory:  clear to auscultation bilaterally, normal work of breathing GI: soft, nontender, nondistended, + BS MS: no deformity or atrophy  Skin: warm and dry, no rash Neuro:  Alert and Oriented x 3, Strength and sensation are intact Psych: euthymic mood, full affect  Wt Readings from Last 3 Encounters:  06/15/20 163 lb 9.6 oz (74.2 kg)  05/29/20 169 lb 6.4 oz (76.8 kg)  05/14/20 170 lb (77.1 kg)      Studies/Labs Reviewed:   EKG:  EKG is ordered today.  The ekg ordered today demonstrates NSR with LAD  Recent Labs: 06/24/2019: ALT 13; BUN 18; Creatinine, Ser 0.73; Hemoglobin 14.1; Platelets 197; Potassium 3.2; Sodium 138   Lipid Panel No results found for: CHOL, TRIG, HDL, CHOLHDL, VLDL, LDLCALC, LDLDIRECT    Additional studies/ records that were reviewed today include:  Notes from CVTS and chest CTA  2D echo 06/05/2020 IMPRESSIONS   1. Aortic valve leaflets are  not thickened or calcified and appear to  open well.  2. There is severe ascending aortic aneurysm with maximum diameter 48 mm,  a gated chest CTA is recommended for further evaluation.  3. Left ventricular ejection fraction, by estimation, is 60 to 65%. The  left ventricle has normal function. The left ventricle has no regional  wall motion abnormalities. Left ventricular diastolic parameters are  consistent with Grade I diastolic  dysfunction (impaired relaxation). The average left ventricular global  longitudinal strain is -19.2 %. The global longitudinal strain is normal.  4. Right ventricular systolic function is normal. The right ventricular  size is normal. There is normal pulmonary artery systolic pressure.  5. The mitral valve is normal in structure. Mild mitral valve  regurgitation. No evidence of mitral stenosis.  6. The aortic valve is normal in structure. Aortic valve regurgitation is  trivial. No aortic stenosis is present.  7. Aortic dilatation noted. There is severe dilatation of the ascending  aorta, measuring 48 mm.  8. The inferior vena cava is normal in size with greater than 50%  respiratory variability, suggesting right atrial pressure of 3 mmHg.    ASSESSMENT:    1. Ascending aortic aneurysm (HCC)   2. Elevated BP without diagnosis of hypertension   3. Coronary artery calcification seen on CAT scan      PLAN:  In order of problems listed above:  1.  Ascending aortic aneurysm -recent Chest CTA showed a stable moderate aneurysm measuring 48mm (51mm by echo) -no evidence of aortic root or AV pathology -followed by Dr. Servando Ray -needs aggressive BP control (goal < 130/22mmHg) -check FLP and add statin if LDL no < 70 -Abd CT in 2019 with no AAA  2.  Elevated BP with no dx of HTN -BP controlled on exam today -I am going to get  a 48 hour BP monitor to assess adequacy of control of BP  3.  Coronary artery calcifications -noted on chest CT -I will  get a coronary Ca score to assess future risk -if Ca score high then will get a Lexiscan myoview -await FLP as he will likely need to be on statin therapy for this as well as aortic disease    Medication Adjustments/Labs and Tests Ordered: Current medicines are reviewed at length with the patient today.  Concerns regarding medicines are outlined above.  Medication changes, Labs and Tests ordered today are listed in the Patient Instructions below.  There are no Patient Instructions on file for this visit.   Signed, Paul Him, MD  06/15/2020 8:53 AM    [address], [place], [place]  [address] Phone: [phone]; Fax: [phone]

## 2020-06-15 ENCOUNTER — Ambulatory Visit (INDEPENDENT_AMBULATORY_CARE_PROVIDER_SITE_OTHER): Payer: PPO | Admitting: Cardiology

## 2020-06-15 ENCOUNTER — Encounter: Payer: Self-pay | Admitting: Cardiology

## 2020-06-15 ENCOUNTER — Other Ambulatory Visit: Payer: Self-pay

## 2020-06-15 VITALS — BP 134/78 | HR 81 | Ht 67.0 in | Wt 163.6 lb

## 2020-06-15 DIAGNOSIS — I712 Thoracic aortic aneurysm, without rupture: Secondary | ICD-10-CM | POA: Diagnosis not present

## 2020-06-15 DIAGNOSIS — I2510 Atherosclerotic heart disease of native coronary artery without angina pectoris: Secondary | ICD-10-CM | POA: Insufficient documentation

## 2020-06-15 DIAGNOSIS — R030 Elevated blood-pressure reading, without diagnosis of hypertension: Secondary | ICD-10-CM | POA: Diagnosis not present

## 2020-06-15 DIAGNOSIS — I10 Essential (primary) hypertension: Secondary | ICD-10-CM | POA: Insufficient documentation

## 2020-06-15 DIAGNOSIS — I7121 Aneurysm of the ascending aorta, without rupture: Secondary | ICD-10-CM | POA: Insufficient documentation

## 2020-06-15 NOTE — Patient Instructions (Signed)
Medication Instructions:  Your physician recommends that you continue on your current medications as directed. Please refer to the Current Medication list given to you today.  *If you need a refill on your cardiac medications before your next appointment, please call your pharmacy*   Lab Work: Fasting lipids and CMET If you have labs (blood work) drawn today and your tests are completely normal, you will receive your results only by: Marland Kitchen MyChart Message (if you have MyChart) OR . A paper copy in the mail If you have any lab test that is abnormal or we need to change your treatment, we will call you to review the results.   Testing/Procedures: Your provider recommends that you have a calcium score CT scan.   Your provider recommends that you wear a 48 hour BP monitor.   Follow-Up: At [facility], you and your health needs are our priority.  As part of our continuing mission to provide you with exceptional heart care, we have created designated Provider Care Teams.  These Care Teams include your primary Cardiologist (physician) and Advanced Practice Providers (APPs -  Physician Assistants and Nurse Practitioners) who all work together to provide you with the care you need, when you need it.  Your next appointment:   1 year(s)  The format for your next appointment:   In Person  Provider:   You may see Fransico Him, MD or one of the following Advanced Practice Providers on your designated Care Team:    Melina Copa, PA-C  Ermalinda Barrios, PA-C

## 2020-06-15 NOTE — Addendum Note (Signed)
Addended by: Antonieta Iba on: 06/15/2020 09:13 AM   Modules accepted: Orders

## 2020-06-26 ENCOUNTER — Other Ambulatory Visit: Payer: Self-pay

## 2020-06-26 ENCOUNTER — Ambulatory Visit (INDEPENDENT_AMBULATORY_CARE_PROVIDER_SITE_OTHER)
Admission: RE | Admit: 2020-06-26 | Discharge: 2020-06-26 | Disposition: A | Discharge status: Home / Self Care | Payer: Self-pay | Source: Ambulatory Visit | Attending: Cardiology | Admitting: Cardiology

## 2020-06-26 ENCOUNTER — Other Ambulatory Visit: Payer: PPO

## 2020-06-26 DIAGNOSIS — I712 Thoracic aortic aneurysm, without rupture: Secondary | ICD-10-CM

## 2020-06-26 DIAGNOSIS — I2510 Atherosclerotic heart disease of native coronary artery without angina pectoris: Secondary | ICD-10-CM

## 2020-06-26 DIAGNOSIS — I7121 Aneurysm of the ascending aorta, without rupture: Secondary | ICD-10-CM

## 2020-06-28 ENCOUNTER — Encounter: Payer: PPO | Admitting: Cardiothoracic Surgery

## 2020-07-05 ENCOUNTER — Other Ambulatory Visit: Payer: Self-pay

## 2020-07-05 ENCOUNTER — Encounter: Payer: Self-pay | Admitting: Pulmonary Disease

## 2020-07-05 ENCOUNTER — Ambulatory Visit: Payer: PPO | Admitting: Pulmonary Disease

## 2020-07-05 VITALS — BP 144/88 | HR 85 | Temp 97.2°F | Ht 67.0 in | Wt 165.0 lb

## 2020-07-05 DIAGNOSIS — R072 Precordial pain: Secondary | ICD-10-CM

## 2020-07-05 DIAGNOSIS — R053 Chronic cough: Secondary | ICD-10-CM | POA: Diagnosis not present

## 2020-07-05 MED ORDER — FLUTICASONE PROPIONATE 50 MCG/ACT NA SUSP: 1.0000 | Freq: Two times a day (BID) | NASAL | 2 refills | Status: DC

## 2020-07-05 MED ORDER — GABAPENTIN 300 MG PO CAPS: 300.0000 mg | ORAL_CAPSULE | Freq: Every day | ORAL | 0 refills | Status: DC

## 2020-07-05 NOTE — Patient Instructions (Signed)
Nice to meet you  Your nose and the back of your throat looked quite inflamed, red.  This makes me think the cough is most likely related to inflammation in the nasal passages and the back of the throat.  I have prescribed a nasal spray to use 1 spray in each nostril twice a day to treat inflammation and hopefully improve the cough.  I suspect the chest pain related to nerve pain possibly from your fluid from lung infection or the surgery itself.  Your cardiac work-up and chest imaging have all been reassuring and normal.  Take gabapentin 300 mg at night.  This can make you sleepy.  I will send a message to Dr. Kenton Kingfisher as I feel he should take the rains on this and continue treatment as needed.  Return to clinic in 3 months.

## 2020-07-05 NOTE — Progress Notes (Signed)
@Patient  ID: Paul Ray, male    DOB: March 26, 1956, 64 y.o.   MRN: [id number]  Chief Complaint  Patient presents with  . Cough    Prod cough with green mucus X3 mos. Treated with 3 rounds of abx by PCP.   Marland Kitchen Chest Pain    Intermittent R sided chest pain X 1 year.     Referring provider: Shirline Frees, MD  HPI:   64 year old man whom are seen in consultation at request of [name], MD for evaluation of chronic cough.  Chief complaint today is cough as well as right-sided chronic chest pain.  Note from referring provider reviewed.  He has productive cough for about 3 months.  Occasionally produces green sputum but not with every episode of coughing.  No clear timing throughout the day when better or worse.  No alleviating or exacerbating factors per his report.  He did receive 3 antibiotic courses without improvement in cough.  Not worse with eating or drinking.  He denies any reflux symptoms, no heartburn.  Does note occasional runny nose, no frank postnasal drip per his description.  Denies any dyspnea on exertion, regular exercises.  Denies history of asthma in the past for recurrent bouts of bronchitis.  Notes he has been clean his house and there are a lot of dust over the last several weeks.  Right-sided chest pain present for the last year or so.  Unsure if this has been there longer.  Seems to worsen over the last several months.  On direct questioning he notes that pain does seem worse as he is weaned off and now complete stop chronic narcotics.  He started weaning off his narcotics 5 months ago and has been off for about 3 months per his report.  Described as someone hit with a hammer, often stabbing, sharp. Usually worse in the morning and gradually eases off during the day.  Personally reviewed 3 CT chest in the last 2 months including recent cardiac CT demonstrates clear lungs, no emphysema no parenchymal or intrathoracic reason for his chest pain, on my interpretation.  PMH:  Chronic joint pain, empyema, pneumonia in 2012 Surgery: Multiple joint surgeries and lumbar fusion, VATS and thoracotomy 2012 for empyema Family history: Mother with COPD, CAD, hypertension, father with CAD, heart failure Social history: Lives in [place], on chronic narcotics for MSK issues he is weaned off over the last 1 month subjectively well for 3 to half months, denies illicit drug use, never smoker  Licensed conveyancer / Pulmonary Flowsheets:   ACT:  No flowsheet data found.  MMRC: No flowsheet data found.  Epworth:  No flowsheet data found.  Tests:   FENO:  No results found for: NITRICOXIDE  PFT: No flowsheet data found.  WALK:  No flowsheet data found.  Imaging: Personally reviewed as per discussion in this note and in EMR CT CARDIAC SCORING  Addendum Date: 06/26/2020   ADDENDUM REPORT: 06/26/2020 16:52 CLINICAL DATA:  Risk stratification EXAM: Coronary Calcium Score TECHNIQUE: The patient was scanned on a [employer]. Axial non-contrast 3 mm slices were carried out through the heart. The data set was analyzed on a dedicated work station and scored using the [place]. FINDINGS: Non-cardiac: See separate report from [facility]. Ascending Aorta: Moderate ascending aortic aneurysm measuring 4.8cm. No calcifications. Pericardium: Normal Coronary arteries: Normal coronary origins. IMPRESSION: Coronary calcium score of 0. This was 0 percentile for age and sex matched control. Moderate ascending aortic aneurysm. Fransico Him Electronically Signed  By: Fransico Him   On: 06/26/2020 16:52   Result Date: 06/26/2020 EXAM: OVER-READ INTERPRETATION  CT CHEST The following report is an over-read performed by radiologist Dr. Vinnie Langton of [facility], [place] on 06/26/2020. This over-read does not include interpretation of cardiac or coronary anatomy or pathology. The coronary calcium score interpretation by the cardiologist is attached. COMPARISON:   Chest CTA 06/13/2020. FINDINGS: Aneurysmal dilatation of the ascending thoracic aorta (4.8 cm in diameter). Within the visualized portions of the thorax there are no suspicious appearing pulmonary nodules or masses, there is no acute consolidative airspace disease, no pleural effusions, no pneumothorax and no lymphadenopathy. Visualized portions of the upper abdomen are unremarkable. There are no aggressive appearing lytic or blastic lesions noted in the visualized portions of the skeleton. IMPRESSION: 1. Ascending thoracic aortic aneurysm measuring 4.8 cm in diameter. Ascending thoracic aortic aneurysm. Recommend semi-annual imaging followup by CTA or MRA and referral to cardiothoracic surgery if not already obtained. This recommendation follows 2010 ACCF/AHA/AATS/ACR/ASA/SCA/SCAI/SIR/STS/SVM Guidelines for the Diagnosis and Management of Patients With Thoracic Aortic Disease. Circulation. 2010; 121: K481-E563. Aortic aneurysm NOS (ICD10-I71.9). Electronically Signed: By: Vinnie Langton M.D. On: 06/26/2020 13:44   CT ANGIO CHEST AORTA W/CM & OR WO/CM  Result Date: 06/13/2020 CLINICAL DATA:  Thoracic aortic aneurysm. EXAM: CT ANGIOGRAPHY CHEST WITH CONTRAST TECHNIQUE: Multidetector CT imaging of the chest was performed using the standard protocol during bolus administration of intravenous contrast. Multiplanar CT image reconstructions and MIPs were obtained to evaluate the vascular anatomy. CONTRAST:  11mL ISOVUE-370 IOPAMIDOL (ISOVUE-370) INJECTION 76% COMPARISON:  05/10/2020 FINDINGS: Cardiovascular: Double oblique imaging of the thoracic aorta today reveals maximum transaxial orthogonal measurements in the mid ascending segment of 4.7 x 4.5 cm. Double oblique transaxial orthogonal imaging at the sino-tubular junction is mildly motion degraded with maximum dimensions of 4.1 x 3.8 cm. Double oblique transaxial imaging through the sinuses of Valsalva reveals orthogonal measurements of 4.3 x 3.9 cm. Mid  descending thoracic aorta measures 2.8 x 2.6 cm in transaxial orthogonal dimensions. The heart size is normal. No substantial pericardial effusion. Coronary artery calcification is evident. No substantial calcification in the wall of the thoracic aorta. Mediastinum/Nodes: No mediastinal lymphadenopathy. There is no hilar lymphadenopathy. The esophagus has normal imaging features. There is no axillary lymphadenopathy. Lungs/Pleura: 5 mm perifissural track with shaped right upper lobe nodule on 73/6 is stable, likely subpleural lymph node. 7 mm nodule deep posterior right costophrenic sulcus on 118/6 is stable and likely scarring related, not substantially changed since abdomen CT of 08/18/2017. No new suspicious nodule or mass. No focal airspace consolidation. No pleural effusion. Dependent atelectasis noted in the lower lobes. Upper Abdomen: Unremarkable. Musculoskeletal: No worrisome lytic or sclerotic osseous abnormality. Review of the MIP images confirms the above findings. IMPRESSION: 1. Stable aneurysmal dilatation of the ascending thoracic aorta measuring up to 4.7 cm in diameter. Detailed measurements above. 2. Stable tiny right pulmonary nodules, most suggestive of benign etiology. 3. Coronary artery atherosclerosis. Electronically Signed   By: Misty Stanley M.D.   On: 06/13/2020 15:38   ECHOCARDIOGRAM COMPLETE  Result Date: 06/05/2020    ECHOCARDIOGRAM REPORT   Patient Name:   Herve ISAO SELTZER   Date of Exam: 06/05/2020 Medical Rec #:  [id number]     Height:       67.0 in Accession #:    [id number]    Weight:       169.4 lb Date of Birth:  1955/07/30     BSA:  1.884 m Patient Age:    84 years      BP:           170/86 mmHg Patient Gender: M             HR:           76 bpm. Exam Location:  [address] Procedure: 3D Echo, 2D Echo, Cardiac Doppler, Color Doppler and Strain Analysis Indications:    Q23.1 Bicuspid aortic valve  History:        Patient has no prior history of Echocardiogram examinations.                  Emphysema.  Sonographer:    Jessee Avers, RDCS Referring Phys: [phone] Lilia Argue ZMOQHUTM IMPRESSIONS  1. Aortic valve leaflets are not thickened or calcified and appear to open well.  2. There is severe ascending aortic aneurysm with maximum diameter 48 mm, a gated chest CTA is recommended for further evaluation.  3. Left ventricular ejection fraction, by estimation, is 60 to 65%. The left ventricle has normal function. The left ventricle has no regional wall motion abnormalities. Left ventricular diastolic parameters are consistent with Grade I diastolic dysfunction (impaired relaxation). The average left ventricular global longitudinal strain is -19.2 %. The global longitudinal strain is normal.  4. Right ventricular systolic function is normal. The right ventricular size is normal. There is normal pulmonary artery systolic pressure.  5. The mitral valve is normal in structure. Mild mitral valve regurgitation. No evidence of mitral stenosis.  6. The aortic valve is normal in structure. Aortic valve regurgitation is trivial. No aortic stenosis is present.  7. Aortic dilatation noted. There is severe dilatation of the ascending aorta, measuring 48 mm.  8. The inferior vena cava is normal in size with greater than 50% respiratory variability, suggesting right atrial pressure of 3 mmHg. FINDINGS  Left Ventricle: Left ventricular ejection fraction, by estimation, is 60 to 65%. The left ventricle has normal function. The left ventricle has no regional wall motion abnormalities. The average left ventricular global longitudinal strain is -19.2 %. The global longitudinal strain is normal. The left ventricular internal cavity size was normal in size. There is no left ventricular hypertrophy. Left ventricular diastolic parameters are consistent with Grade I diastolic dysfunction (impaired relaxation). Normal left ventricular filling pressure. Right Ventricle: The right ventricular size is normal. No increase in  right ventricular wall thickness. Right ventricular systolic function is normal. There is normal pulmonary artery systolic pressure. Left Atrium: Left atrial size was normal in size. Right Atrium: Right atrial size was normal in size. Pericardium: There is no evidence of pericardial effusion. Mitral Valve: The mitral valve is normal in structure. Mild mitral valve regurgitation. No evidence of mitral valve stenosis. Tricuspid Valve: The tricuspid valve is normal in structure. Tricuspid valve regurgitation is mild . No evidence of tricuspid stenosis. Aortic Valve: The aortic valve is normal in structure. Aortic valve regurgitation is trivial. No aortic stenosis is present. Pulmonic Valve: The pulmonic valve was normal in structure. Pulmonic valve regurgitation is not visualized. No evidence of pulmonic stenosis. Aorta: Aortic dilatation noted. There is severe dilatation of the ascending aorta, measuring 48 mm. Venous: The inferior vena cava is normal in size with greater than 50% respiratory variability, suggesting right atrial pressure of 3 mmHg. IAS/Shunts: No atrial level shunt detected by color flow Doppler.  LEFT VENTRICLE PLAX 2D LVIDd:         4.50 cm  Diastology LVIDs:  3.00 cm  LV e' medial:    6.18 cm/s LV PW:         0.90 cm  LV E/e' medial:  13.0 LV IVS:        0.70 cm  LV e' lateral:   11.70 cm/s LVOT diam:     2.20 cm  LV E/e' lateral: 6.9 LV SV:         118 LV SV Index:   63       2D Longitudinal Strain LVOT Area:     3.80 cm 2D Strain GLS (A2C):   -19.7 %                         2D Strain GLS (A3C):   -16.6 %                         2D Strain GLS (A4C):   -21.2 %                         2D Strain GLS Avg:     -19.2 %                          3D Volume EF:                         3D EF:        55 %                         LV EDV:       147 ml                         LV ESV:       66 ml                         LV SV:        80 ml RIGHT VENTRICLE RV Basal diam:  3.50 cm RV S prime:     9.34 cm/s  TAPSE (M-mode): 2.3 cm LEFT ATRIUM           Index       RIGHT ATRIUM           Index LA diam:      3.00 cm 1.59 cm/m  RA Pressure: 3.00 mmHg LA Vol (A2C): 47.5 ml 25.21 ml/m RA Area:     16.20 cm LA Vol (A4C): 34.1 ml 18.10 ml/m RA Volume:   37.40 ml  19.85 ml/m  AORTIC VALVE LVOT Vmax:   132.00 cm/s LVOT Vmean:  96.300 cm/s LVOT VTI:    0.311 m  AORTA Ao Root diam: 4.10 cm Ao Asc diam:  4.80 cm MITRAL VALVE               TRICUSPID VALVE                            Estimated RAP:  3.00 mmHg  MV E velocity: 80.50 cm/s  SHUNTS MV A velocity: 75.70 cm/s  Systemic VTI:  0.31 m MV E/A ratio:  1.06        Systemic Diam: 2.20 cm Ena Dawley MD Electronically signed by Ena Dawley MD Signature Date/Time: 06/05/2020/2:27:42 PM  Final     Lab Results: Personally reviewed, no significant elevation in eosinophils CBC    Component Value Date/Time   WBC 12.2 (H) 06/24/2019 2235   RBC 4.98 06/24/2019 2235   HGB 14.1 06/24/2019 2235   HCT 42.7 06/24/2019 2235   PLT 197 06/24/2019 2235   MCV 85.7 06/24/2019 2235   MCH 28.3 06/24/2019 2235   MCHC 33.0 06/24/2019 2235   RDW 12.6 06/24/2019 2235   LYMPHSABS 0.9 06/24/2019 2235   MONOABS 0.6 06/24/2019 2235   EOSABS 0.0 06/24/2019 2235   BASOSABS 0.0 06/24/2019 2235    BMET    Component Value Date/Time   NA 138 06/24/2019 2235   K 3.2 (L) 06/24/2019 2235   CL 101 06/24/2019 2235   CO2 25 06/24/2019 2235   GLUCOSE 153 (H) 06/24/2019 2235   BUN 18 06/24/2019 2235   CREATININE 0.73 06/24/2019 2235   CALCIUM 8.9 06/24/2019 2235   GFRNONAA >60 06/24/2019 2235   GFRAA >60 06/24/2019 2235    BNP No results found for: BNP  ProBNP No results found for: PROBNP  Specialty Problems      Pulmonary Problems   Pleural effusion    One liter thoracentesis 09/2010: 11K WBC's, mostly eos; LDH 641, -cultures, -path F/u ct chest after tap with very little residual effusion.       Lung mass   Empyema lung (HCC)      Allergies  Allergen  Reactions  . Quinolones     Patient was warned about not using Cipro and similar antibiotics. Recent studies have raised concern that fluoroquinolone antibiotics could be associated with an increased risk of aortic aneurysm Fluoroquinolones have non-antimicrobial properties that might jeopardise the integrity of the extracellular matrix of the vascular wall In a  propensity score matched cohort study in [place], there was a 66% increased rate of aortic aneurysm or dissection associated with oral fluoroquinolone use, compared wit    Immunization History  Administered Date(s) Administered  . Td 04/19/2020  . Tdap 02/11/2018    Past Medical History:  Diagnosis Date  . Abscess of lung(513.0)    Right lower lobe  . ADD (attention deficit disorder)   . Anxiety   . Arthritis   . Ascending aortic aneurysm ([place])    62mm by Chest CTA 05/2020 and 53mm by echo 05/2020  . Benign essential HTN   . Bipolar depression ([place])   . Coronary artery calcification seen on CAT scan   . Empyema lung ([place])   . Hemorrhoids   . Hepatitis C   . Pneumonia    last year  . Prostate abscess   . Prostate troubles     Tobacco History: Social History   Tobacco Use  Smoking Status Never Smoker  Smokeless Tobacco Never Used   Counseling given: Not Answered   Continue to not smoke  Outpatient Encounter Medications as of 07/05/2020  Medication Sig  . alprazolam (XANAX) 2 MG tablet Take 2 mg by mouth 2 (two) times daily as needed for anxiety.  Marland Kitchen amphetamine-dextroamphetamine (ADDERALL XR) 30 MG 24 hr capsule Take 20 mg by mouth daily.   Marland Kitchen lamoTRIgine (LAMICTAL) 200 MG tablet Take 200 mg by mouth at bedtime.  Marland Kitchen MAGNESIUM PO Take 1 tablet by mouth daily.  . tamsulosin (FLOMAX) 0.4 MG CAPS capsule Take by mouth.  . fluticasone (FLONASE) 50 MCG/ACT nasal spray Place 1 spray into both nostrils in the morning and at bedtime.  . gabapentin (NEURONTIN) 300 MG capsule Take 1  capsule (300 mg total) by mouth at  bedtime.   No facility-administered encounter medications on file as of 07/05/2020.     Review of Systems  Review of Systems  No chest pain exertion.  No dyspnea exertion.  No orthopnea or PND.  Comprehensive review system otherwise negative. Physical Exam  BP (!) 144/88 (BP Location: Left Arm, Cuff Size: Normal)   Pulse 85   Temp (!) 97.2 F (36.2 C) (Temporal)   Ht 5\' 7"  (1.702 m)   Wt 165 lb (74.8 kg)   SpO2 98%   BMI 25.84 kg/m   Wt Readings from Last 5 Encounters:  07/05/20 165 lb (74.8 kg)  06/15/20 163 lb 9.6 oz (74.2 kg)  05/29/20 169 lb 6.4 oz (76.8 kg)  05/14/20 170 lb (77.1 kg)  06/24/19 170 lb (77.1 kg)    BMI Readings from Last 5 Encounters:  07/05/20 25.84 kg/m  06/15/20 25.62 kg/m  05/29/20 26.53 kg/m  05/14/20 26.63 kg/m  06/24/19 26.63 kg/m     Physical Exam General: Well-appearing, no acute distress Neck: Supple, no JVD appreciated Eyes: EOMI, no icterus Respiratory: Clear, no wheezes Cardiovascular: Regular rhythm, murmurs Abdomen/GI: Nondistended, bowel sounds present MSK: No synovitis, no joint effusion Neuro: Normal gait, no weakness Psych: Normal mood, full affect.  Tearful at times.   Assessment & Plan:   Chronic cough: Present 3 months, produces phlegm occasionally, draining.  Nasal passages are quite inflamed and edematous as are posterior oropharynx with erythema.  Suspect postnasal drip based on exam.  No obvious asthma symptoms in the past and no dyspnea exertion although cough variant asthma is possible.  Consider GERD but denies reflux or heartburn symptoms.  We will start therapy for postnasal drip with Flonase 1 spray each nostril twice daily.  Chest pain: Chronic.  Suspect emerging in this setting of weaning off and completely stopping his long-term narcotic use.  Etiology is likely nerve versus muscular.  Query post thoracotomy chest pain versus inflammation from prior empyema in 2012.  3 CT chest in the last 2 months  showed clear lungs, no recurrence of fluid, no obvious intra thoracic cause of chest pain.  Recommend gabapentin 300 mg at night.  Will message PCP for ongoing titration and therapy.  This was relayed and explained to the patient.  Neck pain: Expressed to discuss with his PCP.  Suspect neuropathic pain event description.  Again, suspect his issues are emerging as he is off chronic narcotics.   Return in about 3 months (around 10/03/2020).   Lanier Clam, MD 07/05/2020

## 2020-07-12 DIAGNOSIS — Z125 Encounter for screening for malignant neoplasm of prostate: Secondary | ICD-10-CM | POA: Diagnosis not present

## 2020-07-12 DIAGNOSIS — Z0000 Encounter for general adult medical examination without abnormal findings: Secondary | ICD-10-CM | POA: Diagnosis not present

## 2020-07-12 DIAGNOSIS — F3175 Bipolar disorder, in partial remission, most recent episode depressed: Secondary | ICD-10-CM | POA: Diagnosis not present

## 2020-07-12 DIAGNOSIS — N401 Enlarged prostate with lower urinary tract symptoms: Secondary | ICD-10-CM | POA: Diagnosis not present

## 2020-07-12 DIAGNOSIS — Z136 Encounter for screening for cardiovascular disorders: Secondary | ICD-10-CM | POA: Diagnosis not present

## 2020-07-12 DIAGNOSIS — M546 Pain in thoracic spine: Secondary | ICD-10-CM | POA: Diagnosis not present

## 2020-07-19 ENCOUNTER — Telehealth: Payer: Self-pay | Admitting: Radiology

## 2020-07-19 NOTE — Telephone Encounter (Signed)
Patient aware that Paul Ray is not in the office this afternoon and to use ice/heat and anti-inflammatory over the weekend and if not better to call next week and we can see him

## 2020-07-19 NOTE — Telephone Encounter (Signed)
Patient calling answering service c/o pain in right arm and right leg, he would like someone to call him back.  [phone]

## 2020-07-23 ENCOUNTER — Telehealth: Payer: Self-pay | Admitting: *Deleted

## 2020-07-23 ENCOUNTER — Telehealth: Payer: Self-pay

## 2020-07-23 NOTE — Telephone Encounter (Signed)
Lab results received from patient's PCP show LDL at 84. Per Dr. Mayford Knife goal of LDL is less than 70 due to aortic aneurysm, start Lipitor 20 mg daily and get FLP/ALT in 6 weeks.

## 2020-07-23 NOTE — Telephone Encounter (Signed)
Patient declined appointment for 48 hour ambulatory blood pressure monitor.

## 2020-07-25 DIAGNOSIS — R059 Cough, unspecified: Secondary | ICD-10-CM | POA: Diagnosis not present

## 2020-07-25 DIAGNOSIS — U071 COVID-19: Secondary | ICD-10-CM | POA: Diagnosis not present

## 2020-07-25 MED ORDER — ATORVASTATIN CALCIUM 20 MG PO TABS: 20.0000 mg | ORAL_TABLET | Freq: Every day | ORAL | 3 refills | Status: DC

## 2020-07-25 NOTE — Telephone Encounter (Signed)
Spoke with the patient and advised him of Dr. Norris Cross recommendations to start Lipitor 20 mg daily. Patient verbalized understanding. Rx has been sent in. He states that he would prefer to have lab work done by PCP.

## 2020-08-20 ENCOUNTER — Emergency Department (HOSPITAL_COMMUNITY)
Admission: EM | Admit: 2020-08-20 | Discharge: 2020-08-20 | Disposition: A | Discharge status: Home / Self Care | Payer: PPO | Attending: Emergency Medicine | Admitting: Emergency Medicine

## 2020-08-20 ENCOUNTER — Encounter (HOSPITAL_COMMUNITY): Payer: Self-pay

## 2020-08-20 ENCOUNTER — Emergency Department (HOSPITAL_COMMUNITY): Payer: PPO

## 2020-08-20 ENCOUNTER — Other Ambulatory Visit: Payer: Self-pay

## 2020-08-20 DIAGNOSIS — R079 Chest pain, unspecified: Secondary | ICD-10-CM

## 2020-08-20 DIAGNOSIS — I10 Essential (primary) hypertension: Secondary | ICD-10-CM | POA: Insufficient documentation

## 2020-08-20 DIAGNOSIS — J1282 Pneumonia due to coronavirus disease 2019: Secondary | ICD-10-CM | POA: Diagnosis not present

## 2020-08-20 DIAGNOSIS — U071 COVID-19: Secondary | ICD-10-CM | POA: Insufficient documentation

## 2020-08-20 DIAGNOSIS — R000 Tachycardia, unspecified: Secondary | ICD-10-CM | POA: Diagnosis not present

## 2020-08-20 DIAGNOSIS — R0602 Shortness of breath: Secondary | ICD-10-CM | POA: Diagnosis not present

## 2020-08-20 DIAGNOSIS — I213 ST elevation (STEMI) myocardial infarction of unspecified site: Secondary | ICD-10-CM | POA: Diagnosis not present

## 2020-08-20 DIAGNOSIS — R0789 Other chest pain: Secondary | ICD-10-CM | POA: Diagnosis not present

## 2020-08-20 DIAGNOSIS — R0781 Pleurodynia: Secondary | ICD-10-CM | POA: Diagnosis not present

## 2020-08-20 DIAGNOSIS — I712 Thoracic aortic aneurysm, without rupture: Secondary | ICD-10-CM | POA: Diagnosis not present

## 2020-08-20 DIAGNOSIS — J189 Pneumonia, unspecified organism: Secondary | ICD-10-CM

## 2020-08-20 DIAGNOSIS — J9811 Atelectasis: Secondary | ICD-10-CM | POA: Diagnosis not present

## 2020-08-20 LAB — COMPREHENSIVE METABOLIC PANEL
ALT: 15 U/L (ref 0–44)
AST: 32 U/L (ref 15–41)
Albumin: 3.4 g/dL — ABNORMAL LOW (ref 3.5–5.0)
Alkaline Phosphatase: 85 U/L (ref 38–126)
Anion gap: 10 (ref 5–15)
BUN: 15 mg/dL (ref 8–23)
CO2: 24 mmol/L (ref 22–32)
Calcium: 8.7 mg/dL — ABNORMAL LOW (ref 8.9–10.3)
Chloride: 99 mmol/L (ref 98–111)
Creatinine, Ser: 0.66 mg/dL (ref 0.61–1.24)
GFR, Estimated: >60 mL/min (ref >60)
Glucose, Bld: 120 mg/dL — ABNORMAL HIGH (ref 70–99)
Potassium: 4 mmol/L (ref 3.5–5.1)
Sodium: 133 mmol/L — ABNORMAL LOW (ref 135–145)
Total Bilirubin: 0.9 mg/dL (ref 0.3–1.2)
Total Protein: 7.2 g/dL (ref 6.5–8.1)

## 2020-08-20 LAB — TROPONIN I (HIGH SENSITIVITY)
Troponin I (High Sensitivity): 12 ng/L (ref <18)
Troponin I (High Sensitivity): 12 ng/L (ref <18)

## 2020-08-20 LAB — CBC
HCT: 41 % (ref 39.0–52.0)
Hemoglobin: 13.8 g/dL (ref 13.0–17.0)
MCH: 28.5 pg (ref 26.0–34.0)
MCHC: 33.7 g/dL (ref 30.0–36.0)
MCV: 84.7 fL (ref 80.0–100.0)
Platelets: 264 10*3/uL (ref 150–400)
RBC: 4.84 MIL/uL (ref 4.22–5.81)
RDW: 13.1 % (ref 11.5–15.5)
WBC: 8.9 10*3/uL (ref 4.0–10.5)
nRBC: 0 % (ref 0.0–0.2)

## 2020-08-20 LAB — SARS CORONAVIRUS 2 (TAT 6-24 HRS): SARS Coronavirus 2: POSITIVE — AB

## 2020-08-20 MED ORDER — OXYCODONE HCL 5 MG PO TABS
5.0000 mg | ORAL_TABLET | Freq: Once | ORAL | Status: AC
  Administered 2020-08-20: 5 mg via ORAL
  Filled 2020-08-20: qty 1

## 2020-08-20 MED ORDER — LIDOCAINE 5 % EX PTCH: 1.0000 | MEDICATED_PATCH | CUTANEOUS | 0 refills | Status: DC

## 2020-08-20 MED ORDER — AZITHROMYCIN 250 MG PO TABS: ORAL_TABLET | ORAL | 0 refills | Status: DC

## 2020-08-20 MED ORDER — IOHEXOL 350 MG/ML SOLN
100.0000 mL | Freq: Once | INTRAVENOUS | Status: AC | PRN
  Administered 2020-08-20: 100 mL via INTRAVENOUS

## 2020-08-20 MED ORDER — HYDROMORPHONE HCL 1 MG/ML IJ SOLN
0.5000 mg | Freq: Once | INTRAMUSCULAR | Status: AC
  Administered 2020-08-20: 0.5 mg via INTRAVENOUS
  Filled 2020-08-20: qty 1

## 2020-08-20 MED ORDER — METHOCARBAMOL 500 MG PO TABS: 500.0000 mg | ORAL_TABLET | Freq: Three times a day (TID) | ORAL | 0 refills | Status: DC | PRN

## 2020-08-20 NOTE — ED Notes (Signed)
Patient transported to CT 

## 2020-08-20 NOTE — ED Notes (Signed)
Code STEMI downgraded at this time

## 2020-08-20 NOTE — Consult Note (Addendum)
Cardiology Consultation:   Patient ID: Paul Ray MRN: [id number]; DOB: 02-26-1956  Admit date: 08/20/2020 Date of Consult: 08/20/2020  Primary Care Provider: Shirline Frees, MD [facility] Cardiologist: Fransico Him, MD  [facility] Electrophysiologist:  None    Patient Profile:   Paul Ray is a 65 y.o. male with a hx of thoracic aortic aneurysm followed by Dr. Servando Snare, arthritis, and ADD who is being seen today for the evaluation of chest pain and abnormal EKG at the request of Dr. Sedonia Small.  History of Present Illness:   Paul Ray reports that he was playing basketball around 2 PM and began to notice chest pain. He noticed some right-sided chest pain, which has been a chronic issue for him since he fell and injured it few years ago. However, he began to have a sharp left-sided chest pain though he is unable to provide further details about it. The pain resolved on its own but recurred around 10:30 tonight when Paul Ray was in bed. He describes it as a severe stabbing pain in the left side of the chest without radiation. He denies shortness of breath, palpitations, and lightheadedness.  He reports having had "the flu" about a month ago and has not felt particularly well since then.  Paul Ray contacted EMS due to the pain after midnight. EMS EKG was concerning for possible inferior ST segment elevation, though code STEMI was canceled by the EDP on arrival. He continues to complain of severe pain.   Past Medical History:  Diagnosis Date  . Abscess of lung(513.0)    Right lower lobe  . ADD (attention deficit disorder)   . Anxiety   . Arthritis   . Ascending aortic aneurysm ([place])    59mm by Chest CTA 05/2020 and 54mm by echo 05/2020  . Benign essential HTN   . Bipolar depression ([place])   . Coronary artery calcification seen on CAT scan   . Empyema lung ([place])   . Hemorrhoids   . Hepatitis C   . Pneumonia    last year  . Prostate abscess   . Prostate troubles     Past  Surgical History:  Procedure Laterality Date  . ELBOW SURGERY  2005   Right  . FOOT NEUROMA SURGERY  2006   Left  . HIP RESURFACING     [facility] hip at [facility] 01/2011  . LUMBAR LAMINECTOMY/DECOMPRESSION MICRODISCECTOMY  07/24/2011   Procedure: LUMBAR LAMINECTOMY/DECOMPRESSION MICRODISCECTOMY;  Surgeon: Eustace Moore;  Location: [place] NEUR[facility]S;  Service: Neurosurgery;  Laterality: Bilateral;  Bilateral  , Lumbar Three-Four, Lumbar Four-Five Decompressive Laminectomy Rm # 32  . LUNG SURGERY     for empyema  . SHOULDER ARTHROSCOPY Right 07/05/2013   Procedure: RIGHT SHOULDER ARTHROSCOPY WITH DEBRIDEMENT;  Surgeon: Mcarthur Rossetti, MD;  Location: [place];  Service: Orthopedics;  Laterality: Right;  . TENDON TRANSFER Left 01/26/2014   Procedure: LEFT THUMB TRAPEZIECTOMY KNOTTED TENDON INTRAPOSITION TRANSFER OF ABDUCTOR POLLICIS LONGUS TO [place];  Surgeon: Cammie Sickle, MD;  Location: [place];  Service: Orthopedics;  Laterality: Left;  . THORACOTOMY  November 14 2010   rt     Home Medications:  Prior to Admission medications   Medication Sig Start Date Paul Ray Date Taking? Authorizing Provider  alprazolam Duanne Moron) 2 MG tablet Take 2 mg by mouth 2 (two) times daily as needed for anxiety. 10/28/16   [provider]  amphetamine-dextroamphetamine (ADDERALL XR) 30 MG 24 hr capsule Take 20 mg by mouth  daily.     [provider]  atorvastatin (LIPITOR) 20 MG tablet Take 1 tablet (20 mg total) by mouth daily. 07/25/20   Sueanne Margarita, MD  fluticasone (FLONASE) 50 MCG/ACT nasal spray Place 1 spray into both nostrils in the morning and at bedtime. 07/05/20   Hunsucker, Bonna Gains, MD  gabapentin (NEURONTIN) 300 MG capsule Take 1 capsule (300 mg total) by mouth at bedtime. 07/05/20   Hunsucker, Bonna Gains, MD  lamoTRIgine (LAMICTAL) 200 MG tablet Take 200 mg by mouth at bedtime. 01/15/19   [provider]  MAGNESIUM PO Take 1 tablet by mouth daily.    [provider]  tamsulosin (FLOMAX) 0.4 MG CAPS capsule Take by mouth. 04/10/20   [provider]    Inpatient Medications: Scheduled Meds:  Continuous Infusions:  PRN Meds:   Allergies:    Allergies  Allergen Reactions  . Quinolones     Patient was warned about not using Cipro and similar antibiotics. Recent studies have raised concern that fluoroquinolone antibiotics could be associated with an increased risk of aortic aneurysm Fluoroquinolones have non-antimicrobial properties that might jeopardise the integrity of the extracellular matrix of the vascular wall In a  propensity score matched cohort study in [place], there was a 66% increased rate of aortic aneurysm or dissection associated with oral fluoroquinolone use, compared wit    Social History:   Social History   Tobacco Use  . Smoking status: Never Smoker  . Smokeless tobacco: Never Used  Vaping Use  . Vaping Use: Never used  Substance Use Topics  . Alcohol use: No    Comment: quit drinking in 1989  . Drug use: No     Family History:   Family History  Problem Relation Age of Onset  . COPD Mother   . Heart disease Mother   . Hypertension Mother   . Coronary artery disease Father   . Dementia Father   . Heart failure Father   . Prostate cancer Paternal Grandfather        also had bone cancer  . Breast cancer Maternal Grandmother   . Cancer Paternal Grandmother        unsure what kind     ROS:  Please see the history of present illness.  All other ROS reviewed and negative.     Physical Exam/Data:   Vitals:   08/20/20 0121 08/20/20 0122 08/20/20 0129  BP: 130/78 130/78   Pulse: 81 86   Resp: (!) 24 15   Temp:  99.1 F (37.3 C)   TempSrc:  Oral   SpO2: 96% 95% 96%   No intake or output data in the 24 hours ending 08/20/20 0137 Last 3 Weights 07/05/2020 06/15/2020 05/29/2020  Weight (lbs) 165 lb 163 lb 9.6 oz 169 lb 6.4 oz  Weight (kg) 74.844 kg 74.208 kg 76.839 kg     There is no  height or weight on file to calculate BMI.  General: Anxious appearing man, lying on gurney in the hallway HEENT: normal Lymph: no adenopathy Neck: no JVD Endocrine:  No thryomegaly Vascular: No carotid bruits; FA pulses 2+ bilaterally without bruits  Cardiac: Regular rate and rhythm without murmurs, rubs, or gallops. Lungs:  clear to auscultation bilaterally, no wheezing, rhonchi or rales  Abd: soft, nontender, no hepatomegaly  Ext: no edema Musculoskeletal:  No deformities, BUE and BLE strength normal and equal Skin: warm and dry  Neuro:  CNs 2-12 intact, no focal abnormalities noted Psych: Slightly pressured  speech noted.  EKG:  The EKG was personally reviewed and demonstrates: Normal sinus rhythm with borderline LVH and poor R wave progression. No significant change from prior tracing on 06/15/2020. Telemetry:  None.  Relevant CV Studies: TTE (06/05/2020): Normal LV size and wall thickness. LVEF 60-65% with grade 1 diastolic dysfunction. Normal RV size and function. Mild mitral regurgitation. Dilated ascending aorta measuring 4.8 cm.  Laboratory Data:  High Sensitivity Troponin:  No results for input(s): TROPONINIHS in the last 720 hours.   ChemistryNo results for input(s): NA, K, CL, CO2, GLUCOSE, BUN, CREATININE, CALCIUM, GFRNONAA, GFRAA, ANIONGAP in the last 168 hours.  No results for input(s): PROT, ALBUMIN, AST, ALT, ALKPHOS, BILITOT in the last 168 hours. HematologyNo results for input(s): WBC, RBC, HGB, HCT, MCV, MCH, MCHC, RDW, PLT in the last 168 hours. BNPNo results for input(s): BNP, PROBNP in the last 168 hours.  DDimer No results for input(s): DDIMER in the last 168 hours.   Radiology/Studies:  No results found.   Assessment and Plan:   Chest pain and thoracic aortic aneurysm: Ms. Hakim reports chronic intermittent right-sided chest pain that has also been present today. However he notes new left-sided chest pain that began while he was playing basketball this  afternoon and seems to have resolved before returning while getting ready for bed this evening. He continues to have severe left-sided chest pain that is sharp and nonradiating. I have reviewed his EMS and ED EKGs; they do not meet STEMI criteria and are similar to the prior tracing from 06/15/2020. I do not recommend emergent cardiac catheterization at this time. Given known thoracic aortic aneurysm with acute onset of sharp chest pain, I recommend further assessment for acute aortic syndrome, including CTA of the chest.  Pericarditis in the setting of recent suspected viral illness is also a consideration.  Serial EKGs and troponins should also be monitored. I will defer ongoing work-up to Dr. Sedonia Small and the ED team. I have spoken with Dr. Sedonia Small in person about my findings and recommendations.   For questions or updates, please contact [place] Please consult [email or web address] for contact info under    Signed, Nelva Bush, MD  08/20/2020 1:37 AM

## 2020-08-20 NOTE — ED Triage Notes (Signed)
Pt coming from home with c/o CP that began while playing basketball at 1400. Pt woke up tonight with severe CP at 2330 10/10; sharp pain LT side of chest non radiating. Pt also reports SOB. Given 324 ASA, nitro, and morphine.

## 2020-08-20 NOTE — ED Notes (Signed)
MD Jonne Ply at bedside with pt

## 2020-08-20 NOTE — Discharge Instructions (Addendum)
You were evaluated in the Emergency Department and after careful evaluation, we did not find any emergent condition requiring admission or further testing in the hospital.  Your exam/testing today was overall reassuring.  Symptoms seem to be due to inflammation of the lining of the lung.  Take the azithromycin antibiotics as directed and use the Robaxin and Lidoderm patches as needed for pain.  We also recommend Tylenol and Motrin at home.  Please return to the Emergency Department if you experience any worsening of your condition.  Thank you for allowing [place] to be a part of your care.

## 2020-08-20 NOTE — ED Notes (Signed)
Patient transported to X-ray 

## 2020-08-20 NOTE — ED Provider Notes (Addendum)
[facility] Emergency Department Provider Note MRN:  [id number]  Arrival date & time: 08/20/20     Chief Complaint   Code STEMI   History of Present Illness   Paul Ray is a 65 y.o. year-old male with a history of lung abscess, aortic aneurysm presenting to the ED with chief complaint of code STEMI.  Chest pain that began at 2 PM while playing basketball, progressively worsening since that time.  Pain woke him from sleep at 10 PM and he called his primary care doctor who advised him to call EMS.  Pain is severe but improved after aspirin and morphine given by EMS.  Denies dizziness or diaphoresis, no nausea or vomiting.  Pain is much worse with deep breathing, described as sharp, middle of the chest and left-sided.  Review of Systems  A complete 10 system review of systems was obtained and all systems are negative except as noted in the HPI and PMH.   Patient's Health History    Past Medical History:  Diagnosis Date  . Abscess of lung(513.0)    Right lower lobe  . ADD (attention deficit disorder)   . Anxiety   . Arthritis   . Ascending aortic aneurysm ([place])    66mm by Chest CTA 05/2020 and 32mm by echo 05/2020  . Benign essential HTN   . Bipolar depression ([place])   . Coronary artery calcification seen on CAT scan   . Empyema lung ([place])   . Hemorrhoids   . Hepatitis C   . Pneumonia    last year  . Prostate abscess   . Prostate troubles     Past Surgical History:  Procedure Laterality Date  . ELBOW SURGERY  2005   Right  . FOOT NEUROMA SURGERY  2006   Left  . HIP RESURFACING     [facility] hip [facility] 01/2011  . LUMBAR LAMINECTOMY/DECOMPRESSION MICRODISCECTOMY  07/24/2011   Procedure: LUMBAR LAMINECTOMY/DECOMPRESSION MICRODISCECTOMY;  Surgeon: Eustace Moore;  Location: [place] NEURO ORS;  Service: Neurosurgery;  Laterality: Bilateral;  Bilateral  , Lumbar Three-Four, Lumbar Four-Five Decompressive Laminectomy Rm # 32  . LUNG SURGERY     for empyema  .  SHOULDER ARTHROSCOPY Right 07/05/2013   Procedure: RIGHT SHOULDER ARTHROSCOPY WITH DEBRIDEMENT;  Surgeon: Mcarthur Rossetti, MD;  Location: [place];  Service: Orthopedics;  Laterality: Right;  . TENDON TRANSFER Left 01/26/2014   Procedure: LEFT THUMB TRAPEZIECTOMY KNOTTED TENDON INTRAPOSITION TRANSFER OF ABDUCTOR POLLICIS LONGUS TO [place];  Surgeon: Cammie Sickle, MD;  Location: [place];  Service: Orthopedics;  Laterality: Left;  . THORACOTOMY  November 14 2010   rt    Family History  Problem Relation Age of Onset  . COPD Mother   . Heart disease Mother   . Hypertension Mother   . Coronary artery disease Father   . Dementia Father   . Heart failure Father   . Prostate cancer Paternal Grandfather        also had bone cancer  . Breast cancer Maternal Grandmother   . Cancer Paternal Grandmother        unsure what kind    Social History   Socioeconomic History  . Marital status: Single    Spouse name: Not on file  . Number of children: 2  . Years of education: Not on file  . Highest education level: Not on file  Occupational History  . Occupation: Games developer  Tobacco Use  . Smoking status: Never Smoker  .  Smokeless tobacco: Never Used  Vaping Use  . Vaping Use: Never used  Substance and Sexual Activity  . Alcohol use: No    Comment: quit drinking in 1989  . Drug use: No  . Sexual activity: Not on file  Other Topics Concern  . Not on file  Social History Narrative   2 children: Paul Ray and Paul Ray   Social Determinants of Health   Financial Resource Strain: Not on file  Food Insecurity: Not on file  Transportation Needs: Not on file  Physical Activity: Not on file  Stress: Not on file  Social Connections: Not on file  Intimate Partner Violence: Not on file     Physical Exam   Vitals:   08/20/20 0339 08/20/20 0428  BP: 134/76 137/82  Pulse: 78 87  Resp: 18 (!) 26  Temp:    SpO2: 97% 97%    CONSTITUTIONAL: Well-appearing, NAD NEURO:  Alert  and oriented x 3, no focal deficits EYES:  eyes equal and reactive ENT/NECK:  no LAD, no JVD CARDIO: Regular rate, well-perfused, normal S1 and S2 PULM:  CTAB no wheezing or rhonchi GI/GU:  normal bowel sounds, non-distended, non-tender MSK/SPINE:  No gross deformities, no edema SKIN:  no rash, atraumatic PSYCH:  Appropriate speech and behavior  *Additional and/or pertinent findings included in MDM below  Diagnostic and Interventional Summary    EKG Interpretation  Date/Time:  Monday August 20 2020 01:11:57 EST Ventricular Rate:  81 PR Interval:  150 QRS Duration: 94 QT Interval:  382 QTC Calculation: 443 R Axis:   -19 Text Interpretation: Normal sinus rhythm Minimal voltage criteria for LVH, may be normal variant ( Cornell product ) Possible Anterior infarct , age undetermined Abnormal ECG Confirmed by Gerlene Fee [id number]) on 08/20/2020 3:31:11 AM      Labs Reviewed  COMPREHENSIVE METABOLIC PANEL - Abnormal; Notable for the following components:      Result Value   Sodium 133 (*)    Glucose, Bld 120 (*)    Calcium 8.7 (*)    Albumin 3.4 (*)    All other components within normal limits  SARS CORONAVIRUS 2 (TAT 6-24 HRS)  CBC  TROPONIN I (HIGH SENSITIVITY)  TROPONIN I (HIGH SENSITIVITY)    CT ANGIO CHEST AORTA W/CM & OR WO/CM  Final Result    DG Chest 1 View  Final Result      Medications  HYDROmorphone (DILAUDID) injection 0.5 mg (0.5 mg Intravenous Given 08/20/20 0135)  iohexol (OMNIPAQUE) 350 MG/ML injection 100 mL (100 mLs Intravenous Contrast Given 08/20/20 0327)  oxyCODONE (Oxy IR/ROXICODONE) immediate release tablet 5 mg (5 mg Oral Given 08/20/20 0427)     Procedures  /  Critical Care Procedures  ED Course and Medical Decision Making  I have reviewed the triage vital signs, the nursing notes, and pertinent available records from the EMR.  Listed above are laboratory and imaging tests that I personally ordered, reviewed, and interpreted and then  considered in my medical decision making (see below for details).  Code STEMI called prior to arrival.  EKG does not seem to meet STEMI criteria.  Cardiology at bedside evaluating the patient.  Pain is more pleuritic.  Documented history of ascending aortic aneurysm.  Will need to exclude dissection versus PE.  Code STEMI canceled by cardiology, emergent PCI not indicated at this time.  Awaiting labs, CT.     CT is overall reassuring, no dissection, unchanged aneurysm, no PE.  Troponin negative x2.  CT does demonstrate signs  of multifocal pneumonia.  Unclear if this is lingering findings from recent Covid but given the increase in pain will treat for community-acquired pneumonia.  Patient is appropriate for discharge.  Update: I had considered sending patient home with a short course of oxycodone given his degree of pain.  PMP review showed that he has filled 6 different prescriptions for opioids this month alone, totaling over 200 tablets.  He is now expressing his dissatisfaction to nursing regarding his pain management.  Upset that he was only offered 5 mg tablet of oxycodone rather than IV Dilaudid.  He is specifically requesting IV Dilaudid.  There is no emergent condition, he is appropriate for discharge.  We will not be prescribing opioids.  Barth Kirks. Sedonia Small, MD [place] [email or web address]  Final Clinical Impressions(s) / ED Diagnoses     ICD-10-CM   1. Community acquired pneumonia, unspecified laterality  J18.9     ED Discharge Orders         Ordered    azithromycin (ZITHROMAX) 250 MG tablet        08/20/20 0432    methocarbamol (ROBAXIN) 500 MG tablet  Every 8 hours PRN        08/20/20 0432    lidocaine (LIDODERM) 5 %  Every 24 hours        08/20/20 0432           Discharge Instructions Discussed with and Provided to Patient:     Discharge Instructions     You were evaluated in the Emergency Department and after careful  evaluation, we did not find any emergent condition requiring admission or further testing in the hospital.  Your exam/testing today was overall reassuring.  Symptoms seem to be due to inflammation of the lining of the lung.  Take the azithromycin antibiotics as directed and use the Robaxin and Lidoderm patches as needed for pain.  We also recommend Tylenol and Motrin at home.  Please return to the Emergency Department if you experience any worsening of your condition.  Thank you for allowing [place] to be a part of your care.        Maudie Flakes, MD 08/20/20 1610    Maudie Flakes, MD 08/20/20 [phone]

## 2020-08-21 ENCOUNTER — Encounter: Payer: Self-pay | Admitting: Physician Assistant

## 2020-09-03 ENCOUNTER — Telehealth: Payer: Self-pay | Admitting: Pulmonary Disease

## 2020-09-03 NOTE — Telephone Encounter (Signed)
Spoke with patient. He stated that the right side chest pain has returned. A few weeks ago, he fell outside and landed on his right side. He went to the ED to get checked out and was told the pain was now coming from the fall. He has using the lidocaine patches and gabapentin for the pain. They are not helping. He wanted to get scheduled for the sooner appt with MH. Patient has been scheduled for 09/06/20 at 915.   Nothing further needed at time of call.

## 2020-09-04 DIAGNOSIS — L821 Other seborrheic keratosis: Secondary | ICD-10-CM | POA: Diagnosis not present

## 2020-09-04 DIAGNOSIS — L570 Actinic keratosis: Secondary | ICD-10-CM | POA: Diagnosis not present

## 2020-09-04 DIAGNOSIS — L720 Epidermal cyst: Secondary | ICD-10-CM | POA: Diagnosis not present

## 2020-09-04 DIAGNOSIS — L814 Other melanin hyperpigmentation: Secondary | ICD-10-CM | POA: Diagnosis not present

## 2020-09-06 ENCOUNTER — Other Ambulatory Visit: Payer: Self-pay

## 2020-09-06 ENCOUNTER — Ambulatory Visit: Payer: PPO | Admitting: Pulmonary Disease

## 2020-09-06 ENCOUNTER — Encounter: Payer: Self-pay | Admitting: Pulmonary Disease

## 2020-09-06 VITALS — BP 126/80 | HR 82 | Temp 98.4°F | Ht 67.0 in | Wt 154.6 lb

## 2020-09-06 DIAGNOSIS — R0789 Other chest pain: Secondary | ICD-10-CM

## 2020-09-06 DIAGNOSIS — M25511 Pain in right shoulder: Secondary | ICD-10-CM

## 2020-09-06 DIAGNOSIS — G8929 Other chronic pain: Secondary | ICD-10-CM | POA: Diagnosis not present

## 2020-09-06 NOTE — Progress Notes (Signed)
@Patient  ID: Paul Ray, male    DOB: Aug 16, 1955, 65 y.o.   MRN: [id number]  Chief Complaint  Patient presents with  . Follow-up    F/U on right sided chest pain. Was seen in the hospital on 1/24 and diagnosed with PNA and covid.     Referring provider: Shirline Frees, MD  HPI:   65 year old man whom are seeing in follow up for chronic R sided chest pain. Recent ED notes reviewed.   Patient tested positive for Covid (to which he refers to as the flu variant) earlier in January 2022. On 08/19/20, while playing basketball, he developed new left sided chest pain. Awoke from sleep that night with similar pain. Went to ED 08/20/20. Initial STEMI but downgraded after cardiology review. CTA with bilateral peripheral infiltrates consistent with COVID Pna on my interpretation. Sent home with azithromycin for possible CAP. Felt a bit better during azithromycin. Left side chest pain better. Right side persists. [place] a few weeks ago so he thinks this is why a little worse. Endorses chronic R sided neck pain and R shoulder pain.   HPI at initial visit:  He has productive cough for about 3 months.  Occasionally produces green sputum but not with every episode of coughing.  No clear timing throughout the day when better or worse.  No alleviating or exacerbating factors per his report.  He did receive 3 antibiotic courses without improvement in cough.  Not worse with eating or drinking.  He denies any reflux symptoms, no heartburn.  Does note occasional runny nose, no frank postnasal drip per his description.  Denies any dyspnea on exertion, regular exercises.  Denies history of asthma in the past for recurrent bouts of bronchitis.  Notes he has been clean his house and there are a lot of dust over the last several weeks.  Right-sided chest pain present for the last year or so.  Unsure if this has been there longer.  Seems to worsen over the last several months.  On direct questioning he notes that pain does  seem worse as he is weaned off and now complete stop chronic narcotics.  He started weaning off his narcotics 5 months ago and has been off for about 3 months per his report.  Described as someone hit with a hammer, often stabbing, sharp. Usually worse in the morning and gradually eases off during the day.  Personally reviewed 3 CT chest in the last 2 months including recent cardiac CT demonstrates clear lungs, no emphysema no parenchymal or intrathoracic reason for his chest pain, on my interpretation.  PMH: Chronic joint pain, empyema, pneumonia in 2012 Surgery: Multiple joint surgeries and lumbar fusion, VATS and thoracotomy 2012 for empyema Family history: Mother with COPD, CAD, hypertension, father with CAD, heart failure Social history: Lives in [place], on chronic narcotics for MSK issues he is weaned off over the last 1 month subjectively well for 3 to half months, denies illicit drug use, never smoker  Licensed conveyancer / Pulmonary Flowsheets:   ACT:  No flowsheet data found.  MMRC: No flowsheet data found.  Epworth:  No flowsheet data found.  Tests:   FENO:  No results found for: NITRICOXIDE  PFT: No flowsheet data found.  WALK:  No flowsheet data found.  Imaging: Personally reviewed as per discussion in this note and in EMR DG Chest 1 View  Result Date: 08/20/2020 CLINICAL DATA:  Chest pain, left rib pain EXAM: CHEST  1 VIEW COMPARISON:  03/28/2020, CT chest 06/13/2020  FINDINGS: Patchy pulmonary infiltrate has developed within the left mid lung zone peripherally. This may be infectious or inflammatory in the acute setting. Discoid atelectasis within the right mid lung zone. No pneumothorax or pleural effusion. Cardiac size within normal limits. Pulmonary vascularity is normal. No acute bone abnormality. IMPRESSION: Interval development of patchy pulmonary infiltrate within the peripheral left mid lung zone, possibly infectious or inflammatory. Electronically Signed   By:  Fidela Salisbury MD   On: 08/20/2020 02:10   CT ANGIO CHEST AORTA W/CM & OR WO/CM  Result Date: 08/20/2020 CLINICAL DATA:  Patient fell while playing basketball tonight. Woke up with left-sided chest pain radiating down the body. Shortness of breath. Aortic dissection is suggested. EXAM: CT ANGIOGRAPHY CHEST WITH CONTRAST TECHNIQUE: Multidetector CT imaging of the chest was performed using the standard protocol during bolus administration of intravenous contrast. Multiplanar CT image reconstructions and MIPs were obtained to evaluate the vascular anatomy. CONTRAST:  142mL OMNIPAQUE IOHEXOL 350 MG/ML SOLN COMPARISON:  06/13/2020 FINDINGS: Cardiovascular: Arterial phase contrast-enhanced images of the chest demonstrate aneurysm of the ascending thoracic aorta. Maximal AP dimension is 4.7 cm, unchanged since the previous study. The thoracic aorta is patent without evidence of dissection. Great vessel origins are patent. Good opacification of the pulmonary arteries without evidence of significant pulmonary embolus. Normal heart size. No pericardial effusions. Mediastinum/Nodes: Esophagus is decompressed. Mediastinal lymph nodes are not pathologically enlarged. Lungs/Pleura: Scattered nodular infiltrates in the lungs, new since prior study. Appearance suggests multifocal pneumonia. No pleural effusions. No pneumothorax. Airways are patent. Upper Abdomen: Cholelithiasis. No inflammatory changes involving the gallbladder. No acute abnormalities demonstrated in the visualized upper abdomen. Musculoskeletal: No chest wall abnormality. No acute or significant osseous findings. Review of the MIP images confirms the above findings. IMPRESSION: 1. No evidence of significant pulmonary embolus. 2. Stable aneurysm of the ascending thoracic aorta. No evidence of dissection. 3. Scattered nodular infiltrates in the lungs, new since prior study. Appearance suggests multifocal pneumonia. 4. Cholelithiasis. 5. Aortic atherosclerosis.  Aortic Atherosclerosis (ICD10-I70.0). Electronically Signed   By: Lucienne Capers M.D.   On: 08/20/2020 03:40    Lab Results: Personally reviewed, no significant elevation in eosinophils, no anemia CBC    Component Value Date/Time   WBC 8.9 08/20/2020 0015   RBC 4.84 08/20/2020 0015   HGB 13.8 08/20/2020 0015   HCT 41.0 08/20/2020 0015   PLT 264 08/20/2020 0015   MCV 84.7 08/20/2020 0015   MCH 28.5 08/20/2020 0015   MCHC 33.7 08/20/2020 0015   RDW 13.1 08/20/2020 0015   LYMPHSABS 0.9 06/24/2019 2235   MONOABS 0.6 06/24/2019 2235   EOSABS 0.0 06/24/2019 2235   BASOSABS 0.0 06/24/2019 2235    BMET    Component Value Date/Time   NA 133 (L) 08/20/2020 0015   K 4.0 08/20/2020 0015   CL 99 08/20/2020 0015   CO2 24 08/20/2020 0015   GLUCOSE 120 (H) 08/20/2020 0015   BUN 15 08/20/2020 0015   CREATININE 0.66 08/20/2020 0015   CALCIUM 8.7 (L) 08/20/2020 0015   GFRNONAA >60 08/20/2020 0015   GFRAA >60 06/24/2019 2235    BNP No results found for: BNP  ProBNP No results found for: PROBNP  Specialty Problems      Pulmonary Problems   Pleural effusion    One liter thoracentesis 09/2010: 11K WBC's, mostly eos; LDH 641, -cultures, -path F/u ct chest after tap with very little residual effusion.       Lung mass   Empyema lung ([place])  Allergies  Allergen Reactions  . Quinolones     Patient was warned about not using Cipro and similar antibiotics. Recent studies have raised concern that fluoroquinolone antibiotics could be associated with an increased risk of aortic aneurysm Fluoroquinolones have non-antimicrobial properties that might jeopardise the integrity of the extracellular matrix of the vascular wall In a  propensity score matched cohort study in [place], there was a 66% increased rate of aortic aneurysm or dissection associated with oral fluoroquinolone use, compared wit    Immunization History  Administered Date(s) Administered  . Td 04/19/2020  . Tdap  02/11/2018    Past Medical History:  Diagnosis Date  . Abscess of lung(513.0)    Right lower lobe  . ADD (attention deficit disorder)   . Anxiety   . Arthritis   . Ascending aortic aneurysm ([place])    56mm by Chest CTA 05/2020 and 58mm by echo 05/2020  . Benign essential HTN   . Bipolar depression ([place])   . Coronary artery calcification seen on CAT scan   . Empyema lung ([place])   . Hemorrhoids   . Hepatitis C   . Pneumonia    last year  . Prostate abscess     Tobacco History: Social History   Tobacco Use  Smoking Status Never Smoker  Smokeless Tobacco Never Used   Counseling given: Not Answered   Continue to not smoke  Outpatient Encounter Medications as of 09/06/2020  Medication Sig  . alprazolam (XANAX) 2 MG tablet Take 2 mg by mouth 2 (two) times daily as needed for anxiety.  Marland Kitchen atorvastatin (LIPITOR) 20 MG tablet Take 1 tablet (20 mg total) by mouth daily.  Marland Kitchen gabapentin (NEURONTIN) 300 MG capsule Take 1 capsule (300 mg total) by mouth at bedtime. (Patient taking differently: Take 600 mg by mouth 2 (two) times daily.)  . lamoTRIgine (LAMICTAL) 200 MG tablet Take 100 mg by mouth daily.  . Multiple Vitamin (MULTIVITAMINS PO) Take 1 tablet by mouth daily.  . pregabalin (LYRICA) 150 MG capsule Take 150 mg by mouth 2 (two) times daily.  . tamsulosin (FLOMAX) 0.4 MG CAPS capsule Take 0.8 mg by mouth daily.  . [DISCONTINUED] lidocaine (LIDODERM) 5 % Place 1 patch onto the skin daily. Remove & Discard patch within 12 hours or as directed by MD  . [DISCONTINUED] methocarbamol (ROBAXIN) 500 MG tablet Take 1 tablet (500 mg total) by mouth every 8 (eight) hours as needed for muscle spasms.  . [DISCONTINUED] Oxycodone HCl 10 MG TABS Take 10 mg by mouth 4 (four) times daily as needed (pain).  . [DISCONTINUED] azithromycin (ZITHROMAX) 250 MG tablet Take 2 tablets together on the first day, then 1 every day until finished.   No facility-administered encounter medications on file as of  09/06/2020.     Review of Systems  Review of Systems  No chest pain exertion.  No dyspnea exertion.  No orthopnea or PND.  Comprehensive review system otherwise negative. Physical Exam  BP 126/80 (BP Location: Left Arm, Patient Position: Sitting, Cuff Size: Normal)   Pulse 82   Temp 98.4 F (36.9 C) (Temporal)   Ht 5\' 7"  (1.702 m)   Wt 154 lb 9.6 oz (70.1 kg)   SpO2 100% Comment: on RA  BMI 24.21 kg/m   Wt Readings from Last 5 Encounters:  09/06/20 154 lb 9.6 oz (70.1 kg)  08/20/20 156 lb (70.8 kg)  07/05/20 165 lb (74.8 kg)  06/15/20 163 lb 9.6 oz (74.2 kg)  05/29/20 169 lb 6.4  oz (76.8 kg)    BMI Readings from Last 5 Encounters:  09/06/20 24.21 kg/m  08/20/20 24.43 kg/m  07/05/20 25.84 kg/m  06/15/20 25.62 kg/m  05/29/20 26.53 kg/m     Physical Exam General: Well-appearing, no acute distress Neck: Supple, no JVD appreciated Eyes: EOMI, no icterus Respiratory: Clear, no wheezes Cardiovascular: Regular rhythm, murmurs Abdomen/GI: Nondistended, bowel sounds present MSK: No synovitis, no joint effusion Neuro: Normal gait, no weakness   Assessment & Plan:   Chronic cough: Does nto complain fo this at today's visit. Continue Flonase.   Chest pain: Chronic.  Suspect emerging in this setting of weaning off and completely stopping his long-term narcotic use. ED note 08/20/20 reprots review of narcotic database and multiple prescription for pain meds 07/2019.   Etiology is likely nerve versus muscular.  Query post thoracotomy chest pain versus inflammation from prior empyema in 2012.  Mulitple CT chest in the last few months showed clear lungs, no recurrence of fluid, no obvious intra thoracic cause of chest pain.  Defer pharmacologic management to PCP. Refer to PT.  Neck pain: Chronic. Refer to PT   Return if symptoms worsen or fail to improve.   Lanier Clam, MD 09/06/2020  I spent 42 minutes in care of this patient the overwhelming majority of which  was spent face to face discussing symptoms, likely etiology, recommendations for care but also included review of notes, tests, labs etc and coordination of care.

## 2020-09-06 NOTE — Patient Instructions (Addendum)
Continue Gabapentin - advise your PCP continue to try to find right dose if able  [facility] referral [facility]  Contact the shoulder surgeon to consider surgery  If the above does not improve things you are welcome to come back and see [place] at any time

## 2020-09-14 ENCOUNTER — Telehealth: Payer: Self-pay | Admitting: Orthopaedic Surgery

## 2020-09-14 NOTE — Telephone Encounter (Signed)
Patient called asked if he can be referred to a provider for neck pain? Patient said he is in a lot of pain.  The number to contact patient is [phone]

## 2020-09-14 NOTE — Telephone Encounter (Signed)
Can you schedule him with yates?

## 2020-09-14 NOTE — Telephone Encounter (Signed)
Paul Ray should be fine.

## 2020-09-14 NOTE — Telephone Encounter (Signed)
Want to set him up with Lorin [facility]? Or send him out?

## 2020-09-19 ENCOUNTER — Ambulatory Visit: Payer: PPO | Admitting: Rehabilitative and Restorative Service Providers"

## 2020-10-02 ENCOUNTER — Ambulatory Visit (INDEPENDENT_AMBULATORY_CARE_PROVIDER_SITE_OTHER): Payer: PPO | Admitting: Orthopaedic Surgery

## 2020-10-02 ENCOUNTER — Other Ambulatory Visit: Payer: Self-pay

## 2020-10-02 ENCOUNTER — Encounter: Payer: Self-pay | Admitting: Orthopaedic Surgery

## 2020-10-02 DIAGNOSIS — M4722 Other spondylosis with radiculopathy, cervical region: Secondary | ICD-10-CM

## 2020-10-02 NOTE — Progress Notes (Signed)
Office Visit Note   Patient: Paul Ray           Date of Birth: 11/14/1955           MRN: [id number] Visit Date: 10/02/2020              Requested by: Shirline Frees, MD [address] [place],  [place] [id number] PCP: Shirline Frees, MD   Assessment & Plan: Visit Diagnoses:  1. Other spondylosis with radiculopathy, cervical region     Plan: Patient is taking medication topical rubs physical therapy without relief persistent symptoms increased which he rates at severe pain.  Plain radiograph showed mid cervical spondylosis and spurring worse on the facet joints on the right than left.  Would recommend proceeding with MRI scan of the cervical spine office follow-up after scan for review.  Follow-Up Instructions: No follow-ups on file.   Orders:  No orders of the defined types were placed in this encounter.  No orders of the defined types were placed in this encounter.     Procedures: No procedures performed   Clinical Data: No additional findings.   Subjective: Chief Complaint  Patient presents with  . Neck - Pain    HPI 65 year old male referred by Dr.Blackman for problems with cervical spondylosis.  Patient seen Dr. Lelon Mast for his arthritic shoulder on the right.  Patient states his neck "hurts like hell".  Pain in his neck radiates into the right shoulder.  Seems to be worse at night with stabbing pain hurts to touch his clavicle and sometimes radiates to the ribs.  Patient's had previous physical therapy with our therapist Herbie Baltimore with least 5 visits for cervical spine.  He has had persistent neck and shoulder pain.  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  He does pick up some scrap metal as a hobby.  Review of Systems positive for history of empyema abscess along bipolar depression hepatitis C.  Positive cervical spondylosis.  4.7 cm  asending thoracic aortic aneurysm moderate to severe right shoulder  osteoarthritis.   Objective: Vital Signs: BP (!) 151/84 (BP Location: Left Arm, Patient Position: Sitting)   Pulse 70   Ht 5\' 7"  (1.702 m)   Wt 155 lb (70.3 kg)   BMI 24.28 kg/m   Physical Exam Constitutional:      Appearance: He is well-developed and well-nourished.  HENT:     Head: Normocephalic and atraumatic.  Eyes:     Extraocular Movements: EOM normal.     Pupils: Pupils are equal, round, and reactive to light.  Neck:     Thyroid: No thyromegaly.     Trachea: No tracheal deviation.  Cardiovascular:     Rate and Rhythm: Normal rate.  Pulmonary:     Effort: Pulmonary effort is normal.     Breath sounds: No wheezing.  Abdominal:     General: Bowel sounds are normal.     Palpations: Abdomen is soft.  Skin:    General: Skin is warm and dry.     Capillary Refill: Capillary refill takes less than 2 seconds.  Neurological:     Mental Status: He is alert and oriented to person, place, and time.  Psychiatric:        Mood and Affect: Mood and affect normal.        Behavior: Behavior normal.        Thought Content: Thought content normal.  Judgment: Judgment normal.     Ortho Exam pain shoulder range of motion is able get his right arm up over his head but has significant pain with range of motion.  Brachial plexus tenderness right worse than left.  Upper extremity reflexes intact.  Positive Spurling on the right.  Specialty Comments:  No specialty comments available.  Imaging: No results found.   PMFS History: Patient Active Problem List   Diagnosis Date Noted  . Other spondylosis with radiculopathy, cervical region 10/02/2020  . Ascending aortic aneurysm ([place])   . Benign essential HTN   . Coronary artery calcification seen on CAT scan   . Psychoactive substance-induced psychosis ([place])   . Rib pain 07/01/2017  . Closed fracture of one rib of left side 07/01/2017  . Chronic right shoulder pain 05/04/2017  . Osteoarthritis of right hip 12/02/2016  .  Impingement syndrome of right shoulder 07/05/2013  . Empyema lung ([place])   . Abscess of lung(513.0)   . Bipolar depression ([place])   . ADD (attention deficit disorder)   . Hepatitis C   . Hip pain 05/02/2011  . History of total hip arthroplasty 04/23/2011  . Lung mass 10/22/2010  . Pleural effusion 10/15/2010   Past Medical History:  Diagnosis Date  . Abscess of lung(513.0)    Right lower lobe  . ADD (attention deficit disorder)   . Anxiety   . Arthritis   . Ascending aortic aneurysm ([place])    65mm by Chest CTA 05/2020 and 13mm by echo 05/2020  . Benign essential HTN   . Bipolar depression ([place])   . Coronary artery calcification seen on CAT scan   . Empyema lung ([place])   . Hemorrhoids   . Hepatitis C   . Pneumonia    last year  . Prostate abscess     Family History  Problem Relation Age of Onset  . COPD Mother   . Heart disease Mother   . Hypertension Mother   . Coronary artery disease Father   . Dementia Father   . Heart failure Father   . Prostate cancer Paternal Grandfather        also had bone cancer  . Breast cancer Maternal Grandmother   . Cancer Paternal Grandmother        unsure what kind    Past Surgical History:  Procedure Laterality Date  . ELBOW SURGERY  2005   Right  . FOOT NEUROMA SURGERY  2006   Left  . HIP RESURFACING     [facility]  . LUMBAR LAMINECTOMY/DECOMPRESSION MICRODISCECTOMY  07/24/2011   Procedure: LUMBAR LAMINECTOMY/DECOMPRESSION MICRODISCECTOMY;  Surgeon: Eustace Moore;  Location: [facility];  Service: Neurosurgery;  Laterality: Bilateral;  Bilateral  , Lumbar Three-Four, Lumbar Four-Five Decompressive Laminectomy Rm # [id number]  . LUNG SURGERY     for empyema  . SHOULDER ARTHROSCOPY Right 07/05/2013   Procedure: RIGHT SHOULDER ARTHROSCOPY WITH DEBRIDEMENT;  Surgeon: Mcarthur Rossetti, MD;  Location: [facility];  Service: Orthopedics;  Laterality: Right;  . TENDON TRANSFER Left 01/26/2014   Procedure: LEFT THUMB TRAPEZIECTOMY  KNOTTED TENDON INTRAPOSITION TRANSFER OF ABDUCTOR POLLICIS LONGUS TO [place];  Surgeon: Cammie Sickle, MD;  Location: [facility];  Service: Orthopedics;  Laterality: Left;  . THORACOTOMY  November 14 2010   rt   Social History   Occupational History  . Occupation: Games developer  Tobacco Use  . Smoking status: Never Smoker  . Smokeless tobacco: Never Used  Vaping  Use  . Vaping Use: Never used  Substance and Sexual Activity  . Alcohol use: No    Comment: quit drinking in 1989  . Drug use: No  . Sexual activity: Not on file

## 2020-10-09 ENCOUNTER — Other Ambulatory Visit: Payer: Self-pay

## 2020-10-09 ENCOUNTER — Ambulatory Visit
Admission: RE | Admit: 2020-10-09 | Discharge: 2020-10-09 | Disposition: A | Discharge status: Home / Self Care | Payer: PPO | Source: Ambulatory Visit | Attending: Orthopaedic Surgery | Admitting: Orthopaedic Surgery

## 2020-10-09 DIAGNOSIS — M4802 Spinal stenosis, cervical region: Secondary | ICD-10-CM | POA: Diagnosis not present

## 2020-10-09 DIAGNOSIS — M4722 Other spondylosis with radiculopathy, cervical region: Secondary | ICD-10-CM

## 2020-10-14 ENCOUNTER — Other Ambulatory Visit: Payer: PPO

## 2020-10-17 ENCOUNTER — Ambulatory Visit (INDEPENDENT_AMBULATORY_CARE_PROVIDER_SITE_OTHER): Payer: PPO | Admitting: Orthopaedic Surgery

## 2020-10-17 ENCOUNTER — Encounter: Payer: Self-pay | Admitting: Orthopaedic Surgery

## 2020-10-17 ENCOUNTER — Other Ambulatory Visit: Payer: Self-pay

## 2020-10-17 DIAGNOSIS — M4722 Other spondylosis with radiculopathy, cervical region: Secondary | ICD-10-CM

## 2020-10-17 DIAGNOSIS — G8929 Other chronic pain: Secondary | ICD-10-CM | POA: Diagnosis not present

## 2020-10-17 DIAGNOSIS — M542 Cervicalgia: Secondary | ICD-10-CM

## 2020-10-17 DIAGNOSIS — M19011 Primary osteoarthritis, right shoulder: Secondary | ICD-10-CM

## 2020-10-17 DIAGNOSIS — M25511 Pain in right shoulder: Secondary | ICD-10-CM

## 2020-10-17 MED ORDER — LIDOCAINE HCL 1 % IJ SOLN
3.0000 mL | INTRAMUSCULAR | Status: AC | PRN
  Administered 2020-10-17: 3 mL

## 2020-10-17 MED ORDER — METHYLPREDNISOLONE ACETATE 40 MG/ML IJ SUSP
40.0000 mg | INTRAMUSCULAR | Status: AC | PRN
  Administered 2020-10-17: 40 mg via INTRA_ARTICULAR

## 2020-10-17 NOTE — Progress Notes (Signed)
Office Visit Note   Patient: Paul Ray           Date of Birth: 07/16/1956           MRN: [id number] Visit Date: 10/17/2020              Requested by: Shirline Frees, MD [place],  [place] [address] PCP: Shirline Frees, MD   Assessment & Plan: Visit Diagnoses:  1. Other spondylosis with radiculopathy, cervical region   2. Arthritis of right shoulder region   3. Chronic right shoulder pain     Plan: I did provide a steroid injection in his right shoulder today.  I would like to send him to Dr. Ernestina Patches for a cervical epidural steroid injection at the lower level to the right-sided usually it is a C6-C7 versus C7-T1 level but is provided.  He is interested in this intervention as well.  He had a steroid injection in his lumbar spine remotely by Dr. Ernestina Patches.  After he sees Dr. Ernestina Patches for his neck injection he can be set up for a follow-up again with Dr. Lorin Mercy if needed.  This would be for cervical spine.  Follow-Up Instructions: No follow-ups on file.   Orders:  No orders of the defined types were placed in this encounter.  No orders of the defined types were placed in this encounter.     Procedures: Large Joint Inj: R subacromial bursa on 10/17/2020 3:24 PM Indications: pain and diagnostic evaluation Details: 22 G 1.5 in needle  Arthrogram: No  Medications: 3 mL lidocaine 1 %; 40 mg methylPREDNISolone acetate 40 MG/ML Outcome: tolerated well, no immediate complications Procedure, treatment alternatives, risks and benefits explained, specific risks discussed. Consent was given by the patient. Immediately prior to procedure a time out was called to verify the correct patient, procedure, equipment, support staff and site/side marked as required. Patient was prepped and draped in the usual sterile fashion.       Clinical Data: No additional findings.   Subjective: Chief Complaint  Patient presents with  . Right Shoulder - Follow-up  The  patient comes in today as a work in for right shoulder pain.  He has known glenohumeral arthritis and Dr. Marlou Sa has recommended shoulder replacement.  He last had a steroid injection at shoulder several months ago.  He also saw Dr. Lorin Mercy recently due to significant radicular symptoms and right-sided neck pain.  He has had an MRI recently of the cervical spine.  He wants me to go over that today.  He has had severe right shoulder pain recently and would like to have a steroid injection in the right shoulder today.  He has not been getting asleep recently.  HPI  Review of Systems He denies any headache, chest pain, shortness of breath, fever, chills, nausea, vomiting  Objective: Vital Signs: There were no vitals taken for this visit.  Physical Exam He is alert and orient x3 and in no acute distress Ortho Exam Examination of his right shoulder shows fluid range of motion of shoulder but it is painful.  He does have radicular symptoms with a positive Spurling sign to the right side of his neck. Specialty Comments:  No specialty comments available.  Imaging: No results found. The MRI of his cervical spine shows severe foraminal stenosis bilaterally at C 67.  There is arthritic changes at other levels as well that are more prominent right.  PMFS History: Patient Active Problem List   Diagnosis  Date Noted  . Other spondylosis with radiculopathy, cervical region 10/02/2020  . Ascending aortic aneurysm ([place])   . Benign essential HTN   . Coronary artery calcification seen on CAT scan   . Psychoactive substance-induced psychosis ([place])   . Rib pain 07/01/2017  . Closed fracture of one rib of left side 07/01/2017  . Chronic right shoulder pain 05/04/2017  . Osteoarthritis of right hip 12/02/2016  . Impingement syndrome of right shoulder 07/05/2013  . Empyema lung ([place])   . Abscess of lung(513.0)   . Bipolar depression ([place])   . ADD (attention deficit disorder)   . Hepatitis C   . Hip pain  05/02/2011  . History of total hip arthroplasty 04/23/2011  . Lung mass 10/22/2010  . Pleural effusion 10/15/2010   Past Medical History:  Diagnosis Date  . Abscess of lung(513.0)    Right lower lobe  . ADD (attention deficit disorder)   . Anxiety   . Arthritis   . Ascending aortic aneurysm ([place])    31mm by Chest CTA 05/2020 and 62mm by echo 05/2020  . Benign essential HTN   . Bipolar depression ([place])   . Coronary artery calcification seen on CAT scan   . Empyema lung ([place])   . Hemorrhoids   . Hepatitis C   . Pneumonia    last year  . Prostate abscess     Family History  Problem Relation Age of Onset  . COPD Mother   . Heart disease Mother   . Hypertension Mother   . Coronary artery disease Father   . Dementia Father   . Heart failure Father   . Prostate cancer Paternal Grandfather        also had bone cancer  . Breast cancer Maternal Grandmother   . Cancer Paternal Grandmother        unsure what kind    Past Surgical History:  Procedure Laterality Date  . ELBOW SURGERY  2005   Right  . FOOT NEUROMA SURGERY  2006   Left  . HIP RESURFACING     [facility] hip [facility] 01/2011  . LUMBAR LAMINECTOMY/DECOMPRESSION MICRODISCECTOMY  07/24/2011   Procedure: LUMBAR LAMINECTOMY/DECOMPRESSION MICRODISCECTOMY;  Surgeon: Eustace Moore;  Location: [facility];  Service: Neurosurgery;  Laterality: Bilateral;  Bilateral  , Lumbar Three-Four, Lumbar Four-Five Decompressive Laminectomy Rm # [id number]  . LUNG SURGERY     for empyema  . SHOULDER ARTHROSCOPY Right 07/05/2013   Procedure: RIGHT SHOULDER ARTHROSCOPY WITH DEBRIDEMENT;  Surgeon: Mcarthur Rossetti, MD;  Location: [facility];  Service: Orthopedics;  Laterality: Right;  . TENDON TRANSFER Left 01/26/2014   Procedure: LEFT THUMB TRAPEZIECTOMY KNOTTED TENDON INTRAPOSITION TRANSFER OF ABDUCTOR POLLICIS LONGUS TO [place];  Surgeon: Cammie Sickle, MD;  Location: [facility];  Service: Orthopedics;  Laterality: Left;  .  THORACOTOMY  November 14 2010   rt   Social History   Occupational History  . Occupation: Games developer  Tobacco Use  . Smoking status: Never Smoker  . Smokeless tobacco: Never Used  Vaping Use  . Vaping Use: Never used  Substance and Sexual Activity  . Alcohol use: No    Comment: quit drinking in 1989  . Drug use: No  . Sexual activity: Not on file

## 2020-11-07 ENCOUNTER — Ambulatory Visit: Payer: PPO | Admitting: Physical Medicine and Rehabilitation

## 2020-11-09 ENCOUNTER — Encounter (HOSPITAL_COMMUNITY): Payer: Self-pay

## 2020-11-09 ENCOUNTER — Ambulatory Visit (HOSPITAL_COMMUNITY)
Admission: EM | Admit: 2020-11-09 | Discharge: 2020-11-09 | Disposition: A | Discharge status: Home / Self Care | Payer: PPO | Attending: Emergency Medicine | Admitting: Emergency Medicine

## 2020-11-09 ENCOUNTER — Other Ambulatory Visit: Payer: Self-pay

## 2020-11-09 ENCOUNTER — Emergency Department (HOSPITAL_COMMUNITY): Payer: PPO

## 2020-11-09 ENCOUNTER — Inpatient Hospital Stay (HOSPITAL_COMMUNITY)
Admission: EM | Admit: 2020-11-09 | Discharge: 2020-11-11 | DRG: 316 | Disposition: A | Discharge status: Home / Self Care | Payer: PPO | Attending: Cardiology | Admitting: Cardiology

## 2020-11-09 DIAGNOSIS — Z8042 Family history of malignant neoplasm of prostate: Secondary | ICD-10-CM | POA: Diagnosis not present

## 2020-11-09 DIAGNOSIS — F419 Anxiety disorder, unspecified: Secondary | ICD-10-CM | POA: Diagnosis not present

## 2020-11-09 DIAGNOSIS — R0981 Nasal congestion: Secondary | ICD-10-CM | POA: Diagnosis present

## 2020-11-09 DIAGNOSIS — Z20822 Contact with and (suspected) exposure to covid-19: Secondary | ICD-10-CM | POA: Diagnosis not present

## 2020-11-09 DIAGNOSIS — R519 Headache, unspecified: Secondary | ICD-10-CM | POA: Diagnosis present

## 2020-11-09 DIAGNOSIS — Z825 Family history of asthma and other chronic lower respiratory diseases: Secondary | ICD-10-CM

## 2020-11-09 DIAGNOSIS — E785 Hyperlipidemia, unspecified: Secondary | ICD-10-CM | POA: Diagnosis present

## 2020-11-09 DIAGNOSIS — Z8709 Personal history of other diseases of the respiratory system: Secondary | ICD-10-CM | POA: Diagnosis not present

## 2020-11-09 DIAGNOSIS — Z8616 Personal history of COVID-19: Secondary | ICD-10-CM | POA: Diagnosis not present

## 2020-11-09 DIAGNOSIS — R778 Other specified abnormalities of plasma proteins: Secondary | ICD-10-CM | POA: Diagnosis not present

## 2020-11-09 DIAGNOSIS — N400 Enlarged prostate without lower urinary tract symptoms: Secondary | ICD-10-CM | POA: Diagnosis present

## 2020-11-09 DIAGNOSIS — I351 Nonrheumatic aortic (valve) insufficiency: Secondary | ICD-10-CM | POA: Diagnosis not present

## 2020-11-09 DIAGNOSIS — Z8701 Personal history of pneumonia (recurrent): Secondary | ICD-10-CM | POA: Diagnosis not present

## 2020-11-09 DIAGNOSIS — I7121 Aneurysm of the ascending aorta, without rupture: Secondary | ICD-10-CM | POA: Diagnosis present

## 2020-11-09 DIAGNOSIS — I712 Thoracic aortic aneurysm, without rupture: Secondary | ICD-10-CM | POA: Diagnosis not present

## 2020-11-09 DIAGNOSIS — R0789 Other chest pain: Secondary | ICD-10-CM | POA: Diagnosis not present

## 2020-11-09 DIAGNOSIS — R079 Chest pain, unspecified: Secondary | ICD-10-CM | POA: Diagnosis present

## 2020-11-09 DIAGNOSIS — Z79899 Other long term (current) drug therapy: Secondary | ICD-10-CM | POA: Diagnosis not present

## 2020-11-09 DIAGNOSIS — I319 Disease of pericardium, unspecified: Principal | ICD-10-CM | POA: Diagnosis present

## 2020-11-09 DIAGNOSIS — I2510 Atherosclerotic heart disease of native coronary artery without angina pectoris: Secondary | ICD-10-CM | POA: Diagnosis not present

## 2020-11-09 DIAGNOSIS — F988 Other specified behavioral and emotional disorders with onset usually occurring in childhood and adolescence: Secondary | ICD-10-CM | POA: Diagnosis present

## 2020-11-09 DIAGNOSIS — F319 Bipolar disorder, unspecified: Secondary | ICD-10-CM | POA: Diagnosis not present

## 2020-11-09 DIAGNOSIS — Z8249 Family history of ischemic heart disease and other diseases of the circulatory system: Secondary | ICD-10-CM | POA: Diagnosis not present

## 2020-11-09 DIAGNOSIS — J9811 Atelectasis: Secondary | ICD-10-CM | POA: Diagnosis not present

## 2020-11-09 DIAGNOSIS — I10 Essential (primary) hypertension: Secondary | ICD-10-CM | POA: Diagnosis not present

## 2020-11-09 DIAGNOSIS — R1110 Vomiting, unspecified: Secondary | ICD-10-CM | POA: Diagnosis not present

## 2020-11-09 DIAGNOSIS — R42 Dizziness and giddiness: Secondary | ICD-10-CM | POA: Diagnosis not present

## 2020-11-09 DIAGNOSIS — R110 Nausea: Secondary | ICD-10-CM | POA: Diagnosis not present

## 2020-11-09 DIAGNOSIS — Z8619 Personal history of other infectious and parasitic diseases: Secondary | ICD-10-CM | POA: Diagnosis not present

## 2020-11-09 DIAGNOSIS — R112 Nausea with vomiting, unspecified: Secondary | ICD-10-CM | POA: Diagnosis not present

## 2020-11-09 DIAGNOSIS — B1920 Unspecified viral hepatitis C without hepatic coma: Secondary | ICD-10-CM | POA: Diagnosis present

## 2020-11-09 DIAGNOSIS — R531 Weakness: Secondary | ICD-10-CM | POA: Diagnosis not present

## 2020-11-09 LAB — BASIC METABOLIC PANEL
Anion gap: 9 (ref 5–15)
BUN: 14 mg/dL (ref 8–23)
CO2: 29 mmol/L (ref 22–32)
Calcium: 8.7 mg/dL — ABNORMAL LOW (ref 8.9–10.3)
Chloride: 97 mmol/L — ABNORMAL LOW (ref 98–111)
Creatinine, Ser: 0.71 mg/dL (ref 0.61–1.24)
GFR, Estimated: >60 mL/min (ref >60)
Glucose, Bld: 95 mg/dL (ref 70–99)
Potassium: 4 mmol/L (ref 3.5–5.1)
Sodium: 135 mmol/L (ref 135–145)

## 2020-11-09 LAB — CBC WITH DIFFERENTIAL/PLATELET
Abs Immature Granulocytes: 0.02 10*3/uL (ref 0.00–0.07)
Basophils Absolute: 0 10*3/uL (ref 0.0–0.1)
Basophils Relative: 0 %
Eosinophils Absolute: 0.1 10*3/uL (ref 0.0–0.5)
Eosinophils Relative: 2 %
HCT: 46.6 % (ref 39.0–52.0)
Hemoglobin: 15.3 g/dL (ref 13.0–17.0)
Immature Granulocytes: 0 %
Lymphocytes Relative: 12 %
Lymphs Abs: 0.9 10*3/uL (ref 0.7–4.0)
MCH: 29.7 pg (ref 26.0–34.0)
MCHC: 32.8 g/dL (ref 30.0–36.0)
MCV: 90.3 fL (ref 80.0–100.0)
Monocytes Absolute: 0.7 10*3/uL (ref 0.1–1.0)
Monocytes Relative: 10 %
Neutro Abs: 5.6 10*3/uL (ref 1.7–7.7)
Neutrophils Relative %: 76 %
Platelets: 167 10*3/uL (ref 150–400)
RBC: 5.16 MIL/uL (ref 4.22–5.81)
RDW: 14 % (ref 11.5–15.5)
WBC: 7.4 10*3/uL (ref 4.0–10.5)
nRBC: 0 % (ref 0.0–0.2)

## 2020-11-09 LAB — RESP PANEL BY RT-PCR (FLU A&B, COVID) ARPGX2
Influenza A by PCR: NEGATIVE
Influenza B by PCR: NEGATIVE
SARS Coronavirus 2 by RT PCR: NEGATIVE

## 2020-11-09 LAB — D-DIMER, QUANTITATIVE: D-Dimer, Quant: 1.42 ug/mL-FEU — ABNORMAL HIGH (ref 0.00–0.50)

## 2020-11-09 LAB — TROPONIN I (HIGH SENSITIVITY)
Troponin I (High Sensitivity): 173 ng/L (ref <18)
Troponin I (High Sensitivity): 155 ng/L (ref <18)
Troponin I (High Sensitivity): 104 ng/L (ref <18)
Troponin I (High Sensitivity): 113 ng/L (ref <18)
Troponin I (High Sensitivity): 111 ng/L (ref <18)

## 2020-11-09 LAB — SEDIMENTATION RATE: Sed Rate: 4 mm/hr (ref 0–16)

## 2020-11-09 LAB — C-REACTIVE PROTEIN: CRP: 13.7 mg/dL — ABNORMAL HIGH (ref <1.0)

## 2020-11-09 LAB — HIV ANTIBODY (ROUTINE TESTING W REFLEX): HIV Screen 4th Generation wRfx: NONREACTIVE

## 2020-11-09 MED ORDER — HYDROMORPHONE HCL 1 MG/ML IJ SOLN
0.5000 mg | INTRAMUSCULAR | Status: DC | PRN
  Administered 2020-11-09: 0.5 mg via INTRAVENOUS
  Filled 2020-11-09: qty 1

## 2020-11-09 MED ORDER — TAMSULOSIN HCL 0.4 MG PO CAPS
0.8000 mg | ORAL_CAPSULE | Freq: Every day | ORAL | Status: DC
  Administered 2020-11-10 – 2020-11-11 (×2): 0.8 mg via ORAL
  Filled 2020-11-09 (×2): qty 2

## 2020-11-09 MED ORDER — COLCHICINE 0.6 MG PO TABS
0.6000 mg | ORAL_TABLET | Freq: Two times a day (BID) | ORAL | Status: DC
  Administered 2020-11-09 – 2020-11-11 (×4): 0.6 mg via ORAL
  Filled 2020-11-09 (×4): qty 1

## 2020-11-09 MED ORDER — ONDANSETRON HCL 4 MG/2ML IJ SOLN: 4.0000 mg | Freq: Four times a day (QID) | INTRAMUSCULAR | Status: DC | PRN

## 2020-11-09 MED ORDER — NITROGLYCERIN 0.4 MG SL SUBL: 0.4000 mg | SUBLINGUAL_TABLET | SUBLINGUAL | Status: DC | PRN

## 2020-11-09 MED ORDER — MORPHINE SULFATE (PF) 4 MG/ML IV SOLN
4.0000 mg | Freq: Once | INTRAVENOUS | Status: AC
  Administered 2020-11-09: 4 mg via INTRAVENOUS
  Filled 2020-11-09: qty 1

## 2020-11-09 MED ORDER — NITROGLYCERIN 0.4 MG SL SUBL
0.4000 mg | SUBLINGUAL_TABLET | SUBLINGUAL | Status: DC | PRN
  Administered 2020-11-09 (×3): 0.4 mg via SUBLINGUAL
  Filled 2020-11-09 (×3): qty 1

## 2020-11-09 MED ORDER — LIDOCAINE VISCOUS HCL 2 % MT SOLN
15.0000 mL | Freq: Once | OROMUCOSAL | Status: AC
  Administered 2020-11-09: 15 mL via ORAL
  Filled 2020-11-09: qty 15

## 2020-11-09 MED ORDER — HEPARIN (PORCINE) 25000 UT/250ML-% IV SOLN
1350.0000 [IU]/h | INTRAVENOUS | Status: DC
  Administered 2020-11-09: 900 [IU]/h via INTRAVENOUS
  Filled 2020-11-09 (×2): qty 250

## 2020-11-09 MED ORDER — HYDROMORPHONE HCL 1 MG/ML IJ SOLN
1.0000 mg | Freq: Once | INTRAMUSCULAR | Status: AC
  Administered 2020-11-09: 1 mg via INTRAVENOUS
  Filled 2020-11-09: qty 1

## 2020-11-09 MED ORDER — IOHEXOL 350 MG/ML SOLN
75.0000 mL | Freq: Once | INTRAVENOUS | Status: AC | PRN
  Administered 2020-11-09: 75 mL via INTRAVENOUS

## 2020-11-09 MED ORDER — ALUM & MAG HYDROXIDE-SIMETH 200-200-20 MG/5ML PO SUSP
30.0000 mL | Freq: Once | ORAL | Status: AC
  Administered 2020-11-09: 30 mL via ORAL
  Filled 2020-11-09: qty 30

## 2020-11-09 MED ORDER — ENOXAPARIN SODIUM 40 MG/0.4ML ~~LOC~~ SOLN
40.0000 mg | SUBCUTANEOUS | Status: DC
  Administered 2020-11-09: 40 mg via SUBCUTANEOUS
  Filled 2020-11-09: qty 0.4

## 2020-11-09 MED ORDER — ASPIRIN 81 MG PO CHEW: 324.0000 mg | CHEWABLE_TABLET | ORAL | Status: DC

## 2020-11-09 MED ORDER — NITROGLYCERIN IN D5W 200-5 MCG/ML-% IV SOLN: 0.0000 ug/min | INTRAVENOUS | Status: DC

## 2020-11-09 MED ORDER — ALPRAZOLAM 0.5 MG PO TABS
2.0000 mg | ORAL_TABLET | Freq: Two times a day (BID) | ORAL | Status: DC
  Administered 2020-11-09 – 2020-11-11 (×5): 2 mg via ORAL
  Filled 2020-11-09 (×2): qty 8
  Filled 2020-11-09 (×3): qty 4

## 2020-11-09 MED ORDER — HYDROMORPHONE HCL 1 MG/ML IJ SOLN
0.5000 mg | Freq: Once | INTRAMUSCULAR | Status: AC
  Administered 2020-11-09: 0.5 mg via INTRAVENOUS
  Filled 2020-11-09: qty 1

## 2020-11-09 MED ORDER — ACETAMINOPHEN 325 MG PO TABS: 650.0000 mg | ORAL_TABLET | ORAL | Status: DC | PRN

## 2020-11-09 MED ORDER — ASPIRIN 81 MG PO CHEW
324.0000 mg | CHEWABLE_TABLET | Freq: Once | ORAL | Status: AC
  Administered 2020-11-09: 324 mg via ORAL
  Filled 2020-11-09: qty 4

## 2020-11-09 MED ORDER — MORPHINE SULFATE (PF) 2 MG/ML IV SOLN
2.0000 mg | INTRAVENOUS | Status: DC | PRN
  Administered 2020-11-09: 2 mg via INTRAVENOUS
  Filled 2020-11-09: qty 1

## 2020-11-09 MED ORDER — ASPIRIN EC 81 MG PO TBEC
81.0000 mg | DELAYED_RELEASE_TABLET | Freq: Every day | ORAL | Status: DC
  Administered 2020-11-10 – 2020-11-11 (×2): 81 mg via ORAL
  Filled 2020-11-09 (×2): qty 1

## 2020-11-09 MED ORDER — ASPIRIN 300 MG RE SUPP: 300.0000 mg | RECTAL | Status: DC

## 2020-11-09 MED ORDER — LAMOTRIGINE 100 MG PO TABS
200.0000 mg | ORAL_TABLET | Freq: Every morning | ORAL | Status: DC
  Administered 2020-11-10 – 2020-11-11 (×2): 200 mg via ORAL
  Filled 2020-11-09 (×3): qty 2

## 2020-11-09 MED ORDER — ATORVASTATIN CALCIUM 10 MG PO TABS
20.0000 mg | ORAL_TABLET | Freq: Every day | ORAL | Status: DC
  Administered 2020-11-09: 20 mg via ORAL
  Filled 2020-11-09: qty 2

## 2020-11-09 NOTE — ED Notes (Signed)
Notified Dr. Almyra Free of initial troponin of 173

## 2020-11-09 NOTE — Progress Notes (Addendum)
Pt with recurrent chest pain. No relief wit 3 NTG.  Will repeat EKG, and give morphine.  Dr. Gardiner Rhyme aware.   Will repeat troponin hs as well.   No aortic dissection on CTA of chest - no coronary artery calcifications no PE.  Will order GI cocktail as well.  ekg still with hyper T waves ant and early repol inf leads that were present on old EKGs  pain continues despite morphine, will change to dilaudid and begin IV NTG and IV heparin.

## 2020-11-09 NOTE — ED Notes (Signed)
Pt in CT.

## 2020-11-09 NOTE — Progress Notes (Signed)
ANTICOAGULATION CONSULT NOTE - Initial Consult  Pharmacy Consult for heparin Indication: chest pain/ACS  Allergies  Allergen Reactions  . Quinolones Other (See Comments)    Patient was warned about not using Cipro and similar antibiotics. Recent studies have raised concern that fluoroquinolone antibiotics could be associated with an increased risk of aortic aneurysm Fluoroquinolones have non-antimicrobial properties that might jeopardise the integrity of the extracellular matrix of the vascular wall In a  propensity score matched cohort study in [place], there was a 66% increased rate of aortic aneurysm or dissection associated with oral fluoroquinolone use, compared wit    Patient Measurements: Height: 5\' 8"  (172.7 cm) Weight: 74.8 kg (165 lb) IBW/kg (Calculated) : 68.4 Heparin Dosing Weight: 74.8  Vital Signs: Temp: 98.5 F (36.9 C) (04/15 2000) Temp Source: Oral (04/15 2000) BP: 86/75 (04/15 2000) Pulse Rate: 75 (04/15 2000)  Labs: Recent Labs    11/09/20 0928 11/09/20 1140 11/09/20 1556 11/09/20 1910  HGB 15.3  --   --   --   HCT 46.6  --   --   --   PLT 167  --   --   --   CREATININE 0.71  --   --   --   TROPONINIHS 173* 155* 104* 113*    Estimated Creatinine Clearance: 91.4 mL/min (by C-G formula based on SCr of 0.71 mg/dL).   Medical History: Past Medical History:  Diagnosis Date  . Abscess of lung(513.0)    Right lower lobe  . ADD (attention deficit disorder)   . Anxiety   . Arthritis   . Ascending aortic aneurysm ([place])    37mm by Chest CTA 05/2020 and 69mm by echo 05/2020  . Benign essential HTN   . Bipolar depression ([place])   . Coronary artery calcification seen on CAT scan   . Empyema lung ([place])   . Hemorrhoids   . Hepatitis C   . Pneumonia    last year  . Prostate abscess    Assessment: 65 yo male with recurrent CP to start heparin for ACS. Patient received lovenox injection at ~1730 so will not bolus.    Goal of Therapy:  Heparin level  0.3-0.7 units/ml Monitor platelets by anticoagulation protocol: Yes   Plan:  No bolus Heparin drip at 900 units/hr Heparin level in 6 hours Daily Heparin level and CBC  Darrow Barreiro A. Levada Dy, PharmD, BCPS, FNKF Clinical Pharmacist [facility] Please utilize Amion for appropriate phone number to reach the unit pharmacist ([place])    Theotis Burrow 11/09/2020,9:11 PM

## 2020-11-09 NOTE — ED Notes (Signed)
EMS offered, pt prefer go by POV.

## 2020-11-09 NOTE — H&P (Addendum)
Cardiology Admission:   Patient ID: Paul Ray MRN: [id number]; DOB: [birth year]  Admit date: 11/09/2020 Date of Consult: 11/09/2020  PCP:  Shirline Frees, MD   [facility]  Cardiologist:  Fransico Him, MD  Advanced Practice Provider:  No care team member to display Electrophysiologist:  None  [id number]    Patient Profile:   Paul Ray is a 65 y.o. male with a hx of thoracic aortic aneurysm, cervical stenosis with chronic right shoulder pain, Bipolar Disorder , and ADD who is being seen today for the evaluation of chest pain at the request of Dr. Almyra Free.  History of Present Illness:   Paul Ray had Initial onset of pain was approximately 6 hours ago. The patient's chest pain is well-localized, is sharp and is not worse with exertion. The patient's chest pain is in middle of his sternum , is not described as heaviness/pressure/tightness and does not radiate to the arms/jaw/neck. The patient does not complain of nausea and denies diaphoresis. The patient has no history of stroke, has no history of peripheral artery disease, has not smoked in the past 90 days, denies any history of treated diabetes,mother and father had heart disease, has no lipid panel on file, and does not have an elevated BMI (>=30). He did lift a lot of scrap metal on Monday, but denies any more recent strenuous lifting. He has been anxious and frustrated he can no focus enough to clean his home or yard. Has had some intermittent sputum production since December after a mild Covid-19 infection.Denies any history of GERD , gastric ulcers, emesis, or difficulty swallowing.  Past Medical History:  Diagnosis Date  . Abscess of lung(513.0)    Right lower lobe  . ADD (attention deficit disorder)   . Anxiety   . Arthritis   . Ascending aortic aneurysm ([place])    17m by Chest CTA 05/2020 and 433mby echo 05/2020  . Benign essential HTN   . Bipolar depression ([facility]  . Coronary artery calcification seen  on CAT scan   . Empyema lung ([place]  . Hemorrhoids   . Hepatitis C   . Pneumonia    last year  . Prostate abscess     Past Surgical History:  Procedure Laterality Date  . ELBOW SURGERY  2005   Right  . FOOT NEUROMA SURGERY  2006   Left  . HIP RESURFACING     [facility]  . LUMBAR LAMINECTOMY/DECOMPRESSION MICRODISCECTOMY  07/24/2011   Procedure: LUMBAR LAMINECTOMY/DECOMPRESSION MICRODISCECTOMY;  Surgeon: DaEustace Moore [place];  Service: Neurosurgery;  Laterality: Bilateral;  Bilateral  , Lumbar Three-Four, Lumbar Four-Five Decompressive Laminectomy Rm # 32  . LUNG SURGERY     for empyema  . SHOULDER ARTHROSCOPY Right 07/05/2013   Procedure: RIGHT SHOULDER ARTHROSCOPY WITH DEBRIDEMENT;  Surgeon: ChMcarthur RossettiMD;  [place] [facility] Service: [facility];  Laterality: Right;  . TENDON TRANSFER Left 01/26/2014   Procedure: LEFT THUMB TRAPEZIECTOMY KNOTTED TENDON INTRAPOSITION TRANSFER OF ABDUCTOR POLLICIS LONGUS [place] [facility] [place] Surgeon: RoCammie SickleMD;  [place] [facility] Service: Orthopedics;  Laterality: Left;  . THORACOTOMY  November 14 2010   rt     Home Medications:  Prior to Admission medications   Medication Sig Start Date End Date Taking? Authorizing Provider  alprazolam (XDuanne Moron2 MG tablet Take 2 mg by mouth 2 (two) times daily. 10/28/16  Yes [provider]  atorvastatin (LIPITOR) 20 MG tablet Take 1 tablet (20 mg total) by mouth daily. Patient taking differently: Take 20 mg by mouth at bedtime. 07/25/20  Yes Sueanne Margarita, MD  lamoTRIgine (LAMICTAL) 200 MG tablet Take 200 mg by mouth in the morning. 01/15/19  Yes [provider]  tamsulosin (FLOMAX) 0.4 MG CAPS capsule Take 0.8 mg by mouth daily. 04/10/20  Yes [provider]  gabapentin (NEURONTIN) 300 MG capsule Take 1 capsule (300 mg total) by mouth at bedtime. Patient not taking: No sig reported 07/05/20   Hunsucker, Bonna Gains, MD     Inpatient Medications: Scheduled Meds: . alprazolam  2 mg Oral BID  . [START ON 11/10/2020] aspirin EC  81 mg Oral Daily  . atorvastatin  20 mg Oral QHS  . enoxaparin (LOVENOX) injection  40 mg Subcutaneous Q24H  . [START ON 11/10/2020] lamoTRIgine  200 mg Oral q AM  . [START ON 11/10/2020] tamsulosin  0.8 mg Oral Daily   Continuous Infusions:  PRN Meds: acetaminophen, nitroGLYCERIN, ondansetron (ZOFRAN) IV  Allergies:    Allergies  Allergen Reactions  . Quinolones Other (See Comments)    Patient was warned about not using Cipro and similar antibiotics. Recent studies have raised concern that fluoroquinolone antibiotics could be associated with an increased risk of aortic aneurysm Fluoroquinolones have non-antimicrobial properties that might jeopardise the integrity of the extracellular matrix of the vascular wall In a  propensity score matched cohort study in [place], there was a 66% increased rate of aortic aneurysm or dissection associated with oral fluoroquinolone use, compared wit    Social History:   Social History   Socioeconomic History  . Marital status: Single    Spouse name: Not on file  . Number of children: 2  . Years of education: Not on file  . Highest education level: Not on file  Occupational History  . Occupation: Games developer  Tobacco Use  . Smoking status: Never Smoker  . Smokeless tobacco: Never Used  Vaping Use  . Vaping Use: Never used  Substance and Sexual Activity  . Alcohol use: No    Comment: quit drinking in 1989  . Drug use: No  . Sexual activity: Not on file  Other Topics Concern  . Not on file  Social History Narrative   2 children: [name] and [name]   Social Determinants of Health   Financial Resource Strain: Not on file  Food Insecurity: Not on file  Transportation Needs: Not on file  Physical Activity: Not on file  Stress: Not on file  Social Connections: Not on file  Intimate Partner Violence: Not on file    Family History:     Family History  Problem Relation Age of Onset  . COPD Mother   . Heart disease Mother   . Hypertension Mother   . Coronary artery disease Father   . Dementia Father   . Heart failure Father   . Prostate cancer Paternal Grandfather        also had bone cancer  . Breast cancer Maternal Grandmother   . Cancer Paternal Grandmother        unsure what kind     ROS:  Please see the history of present illness.  All other ROS reviewed and negative.     Physical Exam/Data:   Vitals:   11/09/20 1330 11/09/20 1400 11/09/20 1415 11/09/20 1500  BP: 113/60 112/70 125/70 115/84  Pulse: 73 80 78 81  Resp: 13 (!) _0 Temp:  TempSrc:      SpO2: 96% 96% 95% 94%   No intake or output data in the 24 hours ending 11/09/20 1540 Last 3 Weights 10/02/2020 09/06/2020 08/20/2020  Weight (lbs) 155 lb 154 lb 9.6 oz 156 lb  Weight (kg) 70.308 kg 70.126 kg 70.761 kg     There is no height or weight on file to calculate BMI.  General:  Well nourished, well developed, resting in bed , appears comfortable HEENT: [place]/AT, moist mucous membranes, no sclera icterus  Lymph: no adenopathy Neck: no JVD Endocrine:  No thryomegaly Vascular: No carotid bruits; FA pulses 2+ bilaterally without bruits  Cardiac:  normal S1, S2; RRR; no murmur Lungs:  clear to auscultation bilaterally, no wheezing, rhonchi or rales  Abd: soft, nontender, active bowel sounds Ext: no edema Musculoskeletal:  No deformities, BUE and BLE strength normal and equal Skin: warm and dry  Neuro: Alert and oriented x4, non focal  Psych:  anxious affect ,anxious mood  EKG:  The EKG was personally reviewed and demonstrates:  NSR , 75/min PR  .15 QRS .09 QT nl for rate normal axis. Similar to previous ECG's  - Personally Reviewed   Relevant CV Studies: ECHO 06/05/2020  IMPRESSIONS  1. Aortic valve leaflets are not thickened or calcified and appear to  open well.  2. There is severe ascending aortic aneurysm with maximum  diameter 48 mm,  a gated chest CTA is recommended for further evaluation.  3. Left ventricular ejection fraction, by estimation, is 60 to 65%. The  left ventricle has normal function. The left ventricle has no regional  wall motion abnormalities. Left ventricular diastolic parameters are  consistent with Grade I diastolic  dysfunction (impaired relaxation). The average left ventricular global  longitudinal strain is -19.2 %. The global longitudinal strain is normal.  4. Right ventricular systolic function is normal. The right ventricular  size is normal. There is normal pulmonary artery systolic pressure.  5. The mitral valve is normal in structure. Mild mitral valve  regurgitation. No evidence of mitral stenosis.  6. The aortic valve is normal in structure. Aortic valve regurgitation is  trivial. No aortic stenosis is present.  7. Aortic dilatation noted. There is severe dilatation of the ascending  aorta, measuring 48 mm.  8. The inferior vena cava is normal in size with greater than 50%  respiratory variability, suggesting right atrial pressure of 3 mmHg.   ADDENDUM REPORT: 06/26/2020 16:52  CLINICAL DATA:  Risk stratification  EXAM: Coronary Calcium Score  TECHNIQUE: The patient was scanned on a [employer]. Axial non-contrast 3 mm slices were carried out through the heart. The data set was analyzed on a dedicated work station and scored using the [place].  FINDINGS: Non-cardiac: See separate report from [facility] Radiology.  Ascending Aorta: Moderate ascending aortic aneurysm measuring 4.8cm. No calcifications.  Pericardium: Normal  Coronary arteries: Normal coronary origins.  IMPRESSION: Coronary calcium score of 0. This was 0 percentile for age and sex matched control.  Moderate ascending aortic aneurysm.    Laboratory Data:  High Sensitivity Troponin:   Recent Labs  Lab 11/09/20 0928 11/09/20 1140  TROPONINIHS 173*  155*     Chemistry Recent Labs  Lab 11/09/20 0928  NA 135  K 4.0  CL 97*  CO2 29  GLUCOSE 95  BUN 14  CREATININE 0.71  CALCIUM 8.7*  GFRNONAA >60  ANIONGAP 9    No results for input(s): PROT, ALBUMIN, AST, ALT, ALKPHOS, BILITOT in the last  168 hours. Hematology Recent Labs  Lab 11/09/20 0928  WBC 7.4  RBC 5.16  HGB 15.3  HCT 46.6  MCV 90.3  MCH 29.7  MCHC 32.8  RDW 14.0  PLT 167   BNPNo results for input(s): BNP, PROBNP in the last 168 hours.  DDimer No results for input(s): DDIMER in the last 168 hours.   Radiology/Studies:  DG Chest Port 1 View  Result Date: 11/09/2020 CLINICAL DATA:  Triage notes: Pt presents with constant chest pain, headache, since he woke up 2 hrs ago. Denies dizziness, weakness, nausea, vomiting, vision changes EXAM: PORTABLE CHEST 1 VIEW COMPARISON:  08/20/2020. FINDINGS: Cardiac silhouette is normal in size. No mediastinal or hilar masses. Mild linear opacity at the lung bases is consistent with atelectasis. Remainder of the lungs is clear. Hazy opacities noted on the prior chest radiograph have resolved. No convincing pleural effusion and no pneumothorax. Skeletal structures are grossly intact. IMPRESSION: No acute cardiopulmonary disease. Electronically Signed   By: Lajean Manes M.D.   On: 11/09/2020 12:19   CT Angio Chest Aorta w/CM &/OR wo/CM  Result Date: 11/09/2020 CLINICAL DATA:  Chest pain. Elevated troponin. History of thoracic aortic aneurysm. EXAM: CT ANGIOGRAPHY CHEST WITH CONTRAST TECHNIQUE: Multidetector CT imaging of the chest was performed using the standard protocol during bolus administration of intravenous contrast. Multiplanar CT image reconstructions and MIPs were obtained to evaluate the vascular anatomy. CONTRAST:  34m OMNIPAQUE IOHEXOL 350 MG/ML SOLN COMPARISON:  08/20/2020 FINDINGS: Cardiovascular: Dilated ascending thoracic aorta to 4.7 cm, unchanged from the prior exam. No aortic dissection. No significant  atherosclerosis. Arch branch vessels are widely patent. Heart is normal in size and configuration. No coronary artery calcifications. No pericardial effusion. Pulmonary arteries are normal in caliber. Pulmonary arteries are well opacified. No evidence of a pulmonary embolism. Mediastinum/Nodes: Visualized thyroid is unremarkable. No neck base or axillary masses or enlarged lymph nodes. Subcentimeter mediastinal and hilar lymph nodes are noted, stable from the prior study. No masses or enlarged lymph nodes. Trachea and esophagus are unremarkable. Lungs/Pleura: Linear/reticular opacities in the dependent lower lobes consistent with atelectasis, improved compared to the prior exam. No convincing pneumonia and no pulmonary edema. No mass or suspicious nodule. No pleural effusion or pneumothorax. Upper Abdomen: No acute abnormality. Musculoskeletal: No acute fracture or acute finding. No bone lesion. Old right-sided rib fractures. Review of the MIP images confirms the above findings. IMPRESSION: 1. Stable, 4.7 cm ascending thoracic aortic aneurysm. No dissection. No atherosclerosis. Ascending thoracic aortic aneurysm. Recommend semi-annual imaging followup by CTA or MRA and referral to cardiothoracic surgery if not already obtained. This recommendation follows 2010 ACCF/AHA/AATS/ACR/ASA/SCA/SCAI/SIR/STS/SVM Guidelines for the Diagnosis and Management of Patients With Thoracic Aortic Disease. Circulation. 2010; 121:: [id number] Aortic aneurysm NOS (ICD10-I71.9) 2. No acute findings within the chest. 3. Mild dependent atelectasis in the lower lobes which has improved when compared to the prior CT. No evidence of pneumonia or pulmonary edema. Aortic aneurysm NOS (ICD10-I71.9). Electronically Signed   By: DLajean ManesM.D.   On: 11/09/2020 12:18     Assessment and Plan:   #Chest Pain  Patient presented to ED 07/2020 and had reassuring workup for similar chest pain. Has also been seen in the outpatient setting by  Pulmonary and their evaluation suggest chest pain is from post thoracotomy chest pain after stopping long-term narcotic use. Patient was having 10/10 chest pain before receiving pain medications, with improvement to 5/10. Patient is has recently been able to play basketball and do physical labor without  developing chest pain.  CT angio chest reassuring showing ascending thoracic aortic aneurysm is stable at 4.7 cm.  No evidence of PE, pneumothorax, pericardial effusion, or esophageal rupture.   Patient's first high sensitive troponin elevated at 173, trended to 153. EKG machine reads borderline ST elevation in anterior leads, on review the large T waves are present on previous EKG. Reassuring coronary calcium score of 0 , 06/26/2020. Echo 05/2020, EF 60-65% with grade I diastolic dysfunction. Normotensive on exam.   Overall impression is workup has been reassuring with the exception of elevated Troponin. Calcium score 0 last year but it is possible he has non calcified CAD. Plan to admit for further workup and stress test tomorrow. Plan: - TTE - Myoview, weekend team called by PA - Monitor on telemetry overnight -NPO at midnight - On DVT prophylaxis only , unless HS-Troponin trend up on repeat, then Heparin gtt should be started.  - Given Asprin 324 in ED, Continue Asprin 81 mg  - PRN SL nitro for chest pain   #Ascending Aortic Aneurysm - aneurysm stable as mentioned above - Started on Lipitor 20 mg 4 months ago for LDL of 84 (labs under media tab) , with goal LDL of 70 for AAA.  - Lipid Panel  #BPH - Continue home Tamsulosin  #Bipolar Disorder/ Anxiety Continue home medications, Lamotrigine and Xanax 50m BID        Risk Assessment/Risk Scores:     HEAR Score (for undifferentiated chest pain): 2   If score is blank >> Go to the HEART Pathway tab, then [facility].     ::[id number]}         For questions or updates, please contact CD[place]villePlease consult [email or web address]  for contact info under    Signed, JLorene Dy MD  PGY2, IM  11/09/2020 3:40 PM   Patient seen and examined.  Agree with above documentation.  Mr. FHardmanis a 65year old male with a history of thoracic aortic aneurysm, cervical stenosis, BPD who we are consulted to see for evaluation of chest pain at the request of Dr. HAlmyra Free  He reports that he started having chest pain at approximately 6 AM today.  Reports pain on left side of his chest, describes as sharp.  Has been continuous since 6 AM.  Reports pain worsens with deep inspiration.  Pain is not positional.  Given his pain, he presented to the ED.  In the ED, BP 141/93, pulse 71, SPO2 100% on room air.  Labs notable for creatinine 0.7, hemoglobin 15.3, WBC 7.4, COVID negative, troponin 173 >155.  CTA chest was done which showed stable 4.7 cm ascending thoracic aortic aneurysm, no acute findings.  EKG shows LVH with repolarization abnormalities, normal sinus rhythm, rate 78.  On exam, patient is alert and oriented, regular rate and rhythm, no murmurs, lungs CTAB, no LE edema or JVD.   For his chest pain, description is atypical.  Concerning for possible MSK injury as reports he did heavy lifting on Monday, and does have tenderness to palpation.  However unclear cause of troponin elevation.  Notably though, troponins are downtrending, does not have the typical rise and fall of troponins consistent with an acute coronary syndrome.  We will hold off on IV heparin unless troponin rising.  Will check echocardiogram.  Does have pleuritic component to pain, will check ESR, CRP.  Pulmonary arteries were well opacified on CTA chest, no evidence of PE.  [facility]  If low  risk, no further work-up recommended.  ADDENDUM: Labs with significantly elevated CRP and EKG does show diffuse concave ST elevations, appear more pronounced from prior.  Chest pain could represent pericarditis, and with mild troponin elevation, likely represents  myopericarditis.  Will start colchicine.  Donato Heinz, MD

## 2020-11-09 NOTE — ED Triage Notes (Signed)
Pt here with reports of waking up to chest pain this morning. Reports pain is central in location. Pt states over the last week he has had a productive cough. States dx with covid the end of December and has not felt right since.

## 2020-11-09 NOTE — ED Notes (Signed)
Cardiology at bedside.

## 2020-11-09 NOTE — ED Triage Notes (Signed)
Pt presents with constant chest pain, headache,  since he woke up 2 hrs ago. Denies dizziness, weakness, nausea, vomiting, vision changes.

## 2020-11-09 NOTE — ED Provider Notes (Signed)
I discussed pts stable appearing EKG with him in office along with reassuring vital signs. Given his PMH we discussed my recommendation to be evaluated in the ED as I am unable to run troponins or do a chest CT to assess his aneurysm. He is agreeable but elects to walk to the ED as he reports having a "worst experience" the last time he came for chest pain and was sent via EMS. Since his vitals are stable and EKG is stable low risk for complication for a drive/walk across the small parking lot as this is patient preference.    Hughie Closs, [place] 11/09/20 [phone]

## 2020-11-09 NOTE — ED Provider Notes (Signed)
[facility] EMERGENCY DEPARTMENT Provider Note   CSN: [id number] Arrival date & time: 11/09/20  0859     History Chief Complaint  Patient presents with  . Chest Pain    Paul Ray is a 65 y.o. male.  Patient presents chief complaint of chest pain describes as persistent sharp pressure-like pain in the mid chest.  Does not radiate elsewhere.  Worse when he moves his torso a certain way and worse when he takes deep breaths or coughs.  He is complaining of a cough ongoing for about a week.  Has subjective fever last night.  Also complaining of nasal congestion.  States he had COVID about 2 months ago.        Past Medical History:  Diagnosis Date  . Abscess of lung(513.0)    Right lower lobe  . ADD (attention deficit disorder)   . Anxiety   . Arthritis   . Ascending aortic aneurysm ([place])    64mm by Chest CTA 05/2020 and 54mm by echo 05/2020  . Benign essential HTN   . Bipolar depression ([place])   . Coronary artery calcification seen on CAT scan   . Empyema lung ([place])   . Hemorrhoids   . Hepatitis C   . Pneumonia    last year  . Prostate abscess     Patient Active Problem List   Diagnosis Date Noted  . Other spondylosis with radiculopathy, cervical region 10/02/2020  . Ascending aortic aneurysm ([place])   . Benign essential HTN   . Coronary artery calcification seen on CAT scan   . Psychoactive substance-induced psychosis ([place])   . Rib pain 07/01/2017  . Closed fracture of one rib of left side 07/01/2017  . Chronic right shoulder pain 05/04/2017  . Osteoarthritis of right hip 12/02/2016  . Impingement syndrome of right shoulder 07/05/2013  . Empyema lung ([place])   . Abscess of lung(513.0)   . Bipolar depression ([place])   . ADD (attention deficit disorder)   . Hepatitis C   . Hip pain 05/02/2011  . History of total hip arthroplasty 04/23/2011  . Lung mass 10/22/2010  . Pleural effusion 10/15/2010    Past Surgical History:  Procedure Laterality  Date  . ELBOW SURGERY  2005   Right  . FOOT NEUROMA SURGERY  2006   Left  . HIP RESURFACING     [facility] hip [facility] 01/2011  . LUMBAR LAMINECTOMY/DECOMPRESSION MICRODISCECTOMY  07/24/2011   Procedure: LUMBAR LAMINECTOMY/DECOMPRESSION MICRODISCECTOMY;  Surgeon: Eustace Moore;  Location: [facility];  Service: Neurosurgery;  Laterality: Bilateral;  Bilateral  , Lumbar Three-Four, Lumbar Four-Five Decompressive Laminectomy Rm # [phone]  . LUNG SURGERY     for empyema  . SHOULDER ARTHROSCOPY Right 07/05/2013   Procedure: RIGHT SHOULDER ARTHROSCOPY WITH DEBRIDEMENT;  Surgeon: Mcarthur Rossetti, MD;  Location: [facility];  Service: Orthopedics;  Laterality: Right;  . TENDON TRANSFER Left 01/26/2014   Procedure: LEFT THUMB TRAPEZIECTOMY KNOTTED TENDON INTRAPOSITION TRANSFER OF ABDUCTOR POLLICIS LONGUS TO [place];  Surgeon: Cammie Sickle, MD;  Location: [facility];  Service: Orthopedics;  Laterality: Left;  . THORACOTOMY  November 14 2010   rt       Family History  Problem Relation Age of Onset  . COPD Mother   . Heart disease Mother   . Hypertension Mother   . Coronary artery disease Father   . Dementia Father   . Heart failure Father   . Prostate cancer Paternal Grandfather  also had bone cancer  . Breast cancer Maternal Grandmother   . Cancer Paternal Grandmother        unsure what kind    Social History   Tobacco Use  . Smoking status: Never Smoker  . Smokeless tobacco: Never Used  Vaping Use  . Vaping Use: Never used  Substance Use Topics  . Alcohol use: No    Comment: quit drinking in 1989  . Drug use: No    Home Medications Prior to Admission medications   Medication Sig Start Date End Date Taking? Authorizing Provider  alprazolam Duanne Moron) 2 MG tablet Take 2 mg by mouth 2 (two) times daily. 10/28/16  Yes [provider]  atorvastatin (LIPITOR) 20 MG tablet Take 1 tablet (20 mg total) by mouth daily. Patient taking differently: Take 20 mg by  mouth at bedtime. 07/25/20  Yes Sueanne Margarita, MD  lamoTRIgine (LAMICTAL) 200 MG tablet Take 200 mg by mouth in the morning. 01/15/19  Yes [provider]  tamsulosin (FLOMAX) 0.4 MG CAPS capsule Take 0.8 mg by mouth daily. 04/10/20  Yes [provider]  gabapentin (NEURONTIN) 300 MG capsule Take 1 capsule (300 mg total) by mouth at bedtime. Patient not taking: No sig reported 07/05/20   Hunsucker, Bonna Gains, MD    Allergies    Quinolones  Review of Systems   Review of Systems  Constitutional: Negative for fever.  HENT: Negative for ear pain and sore throat.   Eyes: Negative for pain.  Respiratory: Positive for cough.   Cardiovascular: Positive for chest pain.  Gastrointestinal: Negative for abdominal pain.  Genitourinary: Negative for flank pain.  Musculoskeletal: Negative for back pain.  Skin: Negative for color change and rash.  Neurological: Negative for syncope.  All other systems reviewed and are negative.   Physical Exam Updated Vital Signs BP 125/70   Pulse 78   Temp 98.6 F (37 C) (Oral)   Resp 12   SpO2 95%   Physical Exam  ED Results / Procedures / Treatments   Labs (all labs ordered are listed, but only abnormal results are displayed) Labs Reviewed  BASIC METABOLIC PANEL - Abnormal; Notable for the following components:      Result Value   Chloride 97 (*)    Calcium 8.7 (*)    All other components within normal limits  TROPONIN I (HIGH SENSITIVITY) - Abnormal; Notable for the following components:   Troponin I (High Sensitivity) 173 (*)    All other components within normal limits  TROPONIN I (HIGH SENSITIVITY) - Abnormal; Notable for the following components:   Troponin I (High Sensitivity) 155 (*)    All other components within normal limits  RESP PANEL BY RT-PCR (FLU A&B, COVID) [id number]  CBC WITH DIFFERENTIAL/PLATELET    EKG EKG Interpretation  Date/Time:  Friday November 09 2020 09:19:41 EDT Ventricular Rate:  70 PR  Interval:  156 QRS Duration: 90 QT Interval:  385 QTC Calculation: 416 R Axis:   0 Text Interpretation: Sinus rhythm Left atrial enlargement Inferior infarct, acute (LCx) Borderline ST elevation, anterior leads Lateral leads are also involved Similar to prior EKG's in past. mild depression seen inaVL Reconfirmed by Thamas Jaegers ([id number]) on 11/09/2020 9:26:09 AM   Radiology DG Chest Port 1 View  Result Date: 11/09/2020 CLINICAL DATA:  Triage notes: Pt presents with constant chest pain, headache, since he woke up 2 hrs ago. Denies dizziness, weakness, nausea, vomiting, vision changes EXAM: PORTABLE CHEST 1 VIEW COMPARISON:  08/20/2020. FINDINGS: Cardiac  silhouette is normal in size. No mediastinal or hilar masses. Mild linear opacity at the lung bases is consistent with atelectasis. Remainder of the lungs is clear. Hazy opacities noted on the prior chest radiograph have resolved. No convincing pleural effusion and no pneumothorax. Skeletal structures are grossly intact. IMPRESSION: No acute cardiopulmonary disease. Electronically Signed   By: Lajean Manes M.D.   On: 11/09/2020 12:19   CT Angio Chest Aorta w/CM &/OR wo/CM  Result Date: 11/09/2020 CLINICAL DATA:  Chest pain. Elevated troponin. History of thoracic aortic aneurysm. EXAM: CT ANGIOGRAPHY CHEST WITH CONTRAST TECHNIQUE: Multidetector CT imaging of the chest was performed using the standard protocol during bolus administration of intravenous contrast. Multiplanar CT image reconstructions and MIPs were obtained to evaluate the vascular anatomy. CONTRAST:  50mL OMNIPAQUE IOHEXOL 350 MG/ML SOLN COMPARISON:  08/20/2020 FINDINGS: Cardiovascular: Dilated ascending thoracic aorta to 4.7 cm, unchanged from the prior exam. No aortic dissection. No significant atherosclerosis. Arch branch vessels are widely patent. Heart is normal in size and configuration. No coronary artery calcifications. No pericardial effusion. Pulmonary arteries are normal in  caliber. Pulmonary arteries are well opacified. No evidence of a pulmonary embolism. Mediastinum/Nodes: Visualized thyroid is unremarkable. No neck base or axillary masses or enlarged lymph nodes. Subcentimeter mediastinal and hilar lymph nodes are noted, stable from the prior study. No masses or enlarged lymph nodes. Trachea and esophagus are unremarkable. Lungs/Pleura: Linear/reticular opacities in the dependent lower lobes consistent with atelectasis, improved compared to the prior exam. No convincing pneumonia and no pulmonary edema. No mass or suspicious nodule. No pleural effusion or pneumothorax. Upper Abdomen: No acute abnormality. Musculoskeletal: No acute fracture or acute finding. No bone lesion. Old right-sided rib fractures. Review of the MIP images confirms the above findings. IMPRESSION: 1. Stable, 4.7 cm ascending thoracic aortic aneurysm. No dissection. No atherosclerosis. Ascending thoracic aortic aneurysm. Recommend semi-annual imaging followup by CTA or MRA and referral to cardiothoracic surgery if not already obtained. This recommendation follows 2010 ACCF/AHA/AATS/ACR/ASA/SCA/SCAI/SIR/STS/SVM Guidelines for the Diagnosis and Management of Patients With Thoracic Aortic Disease. Circulation. 2010; 121: P509-T267. Aortic aneurysm NOS (ICD10-I71.9) 2. No acute findings within the chest. 3. Mild dependent atelectasis in the lower lobes which has improved when compared to the prior CT. No evidence of pneumonia or pulmonary edema. Aortic aneurysm NOS (ICD10-I71.9). Electronically Signed   By: Lajean Manes M.D.   On: 11/09/2020 12:18    Procedures .Critical Care E&M Performed by: Luna Fuse, MD  Critical care provider statement:    Critical care time (minutes):  30   Critical care time was exclusive of:  Separately billable procedures and treating other patients   Critical care was necessary to treat or prevent imminent or life-threatening deterioration of the following conditions:   Cardiac failure After initial E/M assessment, critical care services were subsequently performed that were exclusive of separately billable procedures or treatment.   Comments:     Elevated troponin      Medications Ordered in ED Medications  nitroGLYCERIN (NITROSTAT) SL tablet 0.4 mg (has no administration in time range)  morphine 4 MG/ML injection 4 mg (4 mg Intravenous Given 11/09/20 1034)  aspirin chewable tablet 324 mg (324 mg Oral Given 11/09/20 1108)  HYDROmorphone (DILAUDID) injection 0.5 mg (0.5 mg Intravenous Given 11/09/20 1149)  iohexol (OMNIPAQUE) 350 MG/ML injection 75 mL (75 mLs Intravenous Contrast Given 11/09/20 1150)  HYDROmorphone (DILAUDID) injection 1 mg (1 mg Intravenous Given 11/09/20 1339)    ED Course  I have reviewed the triage  vital signs and the nursing notes.  Pertinent labs & imaging results that were available during my care of the patient were reviewed by me and considered in my medical decision making (see chart for details).  Clinical Course as of 11/09/20 1458  Fri Nov 09, 2020  1203 D/w cardiology  [JH]    Clinical Course User Index [JH] Almyra Free Greggory Brandy, MD   MDM Rules/Calculators/A&P                          Patient presents with chest pain appears atypical on history.  However troponin is elevated.  EKG appears similar to prior EKGs.  Is read as a STEMI by the computer however appears very similar to majority of his prior EKGs in the past.  Second troponin also sent slightly downtrending.  CT of the chest pursued given history of aortic aneurysm.  This appears unremarkable compared to prior CTs and unchanged.  In consultation with cardiology, they will admit the patient for further evaluation assessment.  Final Clinical Impression(s) / ED Diagnoses Final diagnoses:  Midsternal chest pain  Elevated troponin    Rx / DC Orders ED Discharge Orders    None       Luna Fuse, MD 11/09/20 1459

## 2020-11-10 ENCOUNTER — Encounter (HOSPITAL_COMMUNITY): Payer: Self-pay | Admitting: Cardiology

## 2020-11-10 ENCOUNTER — Observation Stay (HOSPITAL_COMMUNITY): Payer: PPO

## 2020-11-10 DIAGNOSIS — R0981 Nasal congestion: Secondary | ICD-10-CM | POA: Diagnosis present

## 2020-11-10 DIAGNOSIS — E785 Hyperlipidemia, unspecified: Secondary | ICD-10-CM | POA: Diagnosis present

## 2020-11-10 DIAGNOSIS — I319 Disease of pericardium, unspecified: Secondary | ICD-10-CM | POA: Diagnosis present

## 2020-11-10 DIAGNOSIS — R519 Headache, unspecified: Secondary | ICD-10-CM | POA: Diagnosis present

## 2020-11-10 DIAGNOSIS — R778 Other specified abnormalities of plasma proteins: Secondary | ICD-10-CM | POA: Diagnosis present

## 2020-11-10 DIAGNOSIS — Z8619 Personal history of other infectious and parasitic diseases: Secondary | ICD-10-CM | POA: Diagnosis not present

## 2020-11-10 DIAGNOSIS — R079 Chest pain, unspecified: Secondary | ICD-10-CM | POA: Diagnosis not present

## 2020-11-10 DIAGNOSIS — F419 Anxiety disorder, unspecified: Secondary | ICD-10-CM | POA: Diagnosis present

## 2020-11-10 DIAGNOSIS — Z8616 Personal history of COVID-19: Secondary | ICD-10-CM | POA: Diagnosis not present

## 2020-11-10 DIAGNOSIS — Z79899 Other long term (current) drug therapy: Secondary | ICD-10-CM | POA: Diagnosis not present

## 2020-11-10 DIAGNOSIS — Z8701 Personal history of pneumonia (recurrent): Secondary | ICD-10-CM | POA: Diagnosis not present

## 2020-11-10 DIAGNOSIS — I351 Nonrheumatic aortic (valve) insufficiency: Secondary | ICD-10-CM | POA: Diagnosis not present

## 2020-11-10 DIAGNOSIS — N400 Enlarged prostate without lower urinary tract symptoms: Secondary | ICD-10-CM | POA: Diagnosis present

## 2020-11-10 DIAGNOSIS — Z8249 Family history of ischemic heart disease and other diseases of the circulatory system: Secondary | ICD-10-CM | POA: Diagnosis not present

## 2020-11-10 DIAGNOSIS — F319 Bipolar disorder, unspecified: Secondary | ICD-10-CM | POA: Diagnosis present

## 2020-11-10 DIAGNOSIS — Z20822 Contact with and (suspected) exposure to covid-19: Secondary | ICD-10-CM | POA: Diagnosis present

## 2020-11-10 DIAGNOSIS — Z8709 Personal history of other diseases of the respiratory system: Secondary | ICD-10-CM | POA: Diagnosis not present

## 2020-11-10 DIAGNOSIS — Z8042 Family history of malignant neoplasm of prostate: Secondary | ICD-10-CM | POA: Diagnosis not present

## 2020-11-10 DIAGNOSIS — I2510 Atherosclerotic heart disease of native coronary artery without angina pectoris: Secondary | ICD-10-CM | POA: Diagnosis present

## 2020-11-10 DIAGNOSIS — Z825 Family history of asthma and other chronic lower respiratory diseases: Secondary | ICD-10-CM | POA: Diagnosis not present

## 2020-11-10 DIAGNOSIS — I712 Thoracic aortic aneurysm, without rupture: Secondary | ICD-10-CM | POA: Diagnosis present

## 2020-11-10 DIAGNOSIS — I10 Essential (primary) hypertension: Secondary | ICD-10-CM | POA: Diagnosis present

## 2020-11-10 DIAGNOSIS — F988 Other specified behavioral and emotional disorders with onset usually occurring in childhood and adolescence: Secondary | ICD-10-CM | POA: Diagnosis present

## 2020-11-10 LAB — NM MYOCAR MULTI W/SPECT W/WALL MOTION / EF
MPHR: 157 {beats}/min
Peak HR: 90 {beats}/min
Percent HR: 57 %
Rest HR: 69 {beats}/min

## 2020-11-10 LAB — ECHOCARDIOGRAM COMPLETE
AR max vel: 3.78 cm2
AV Area VTI: 3.85 cm2
AV Area mean vel: 3.67 cm2
AV Mean grad: 5 mmHg
AV Peak grad: 8.2 mmHg
Ao pk vel: 1.43 m/s
Area-P 1/2: 4.63 cm2
Height: 68 in
S' Lateral: 2.4 cm
Weight: 2631.41 oz

## 2020-11-10 LAB — BASIC METABOLIC PANEL
Anion gap: 6 (ref 5–15)
BUN: 17 mg/dL (ref 8–23)
CO2: 30 mmol/L (ref 22–32)
Calcium: 8.2 mg/dL — ABNORMAL LOW (ref 8.9–10.3)
Chloride: 96 mmol/L — ABNORMAL LOW (ref 98–111)
Creatinine, Ser: 0.72 mg/dL (ref 0.61–1.24)
GFR, Estimated: >60 mL/min (ref >60)
Glucose, Bld: 101 mg/dL — ABNORMAL HIGH (ref 70–99)
Potassium: 3.6 mmol/L (ref 3.5–5.1)
Sodium: 132 mmol/L — ABNORMAL LOW (ref 135–145)

## 2020-11-10 LAB — RAPID URINE DRUG SCREEN, HOSP PERFORMED
Amphetamines: NOT DETECTED
Barbiturates: NOT DETECTED
Benzodiazepines: POSITIVE — AB
Cocaine: NOT DETECTED
Opiates: POSITIVE — AB
Tetrahydrocannabinol: NOT DETECTED

## 2020-11-10 LAB — TROPONIN I (HIGH SENSITIVITY): Troponin I (High Sensitivity): 158 ng/L (ref <18)

## 2020-11-10 LAB — CBC
HCT: 39.4 % (ref 39.0–52.0)
Hemoglobin: 13.5 g/dL (ref 13.0–17.0)
MCH: 30 pg (ref 26.0–34.0)
MCHC: 34.3 g/dL (ref 30.0–36.0)
MCV: 87.6 fL (ref 80.0–100.0)
Platelets: 141 10*3/uL — ABNORMAL LOW (ref 150–400)
RBC: 4.5 MIL/uL (ref 4.22–5.81)
RDW: 13.9 % (ref 11.5–15.5)
WBC: 6 10*3/uL (ref 4.0–10.5)
nRBC: 0 % (ref 0.0–0.2)

## 2020-11-10 LAB — LIPID PANEL
Cholesterol: 97 mg/dL (ref 0–200)
HDL: 52 mg/dL (ref >40)
LDL Cholesterol: 37 mg/dL (ref 0–99)
Total CHOL/HDL Ratio: 1.9 RATIO
Triglycerides: 40 mg/dL (ref <150)
VLDL: 8 mg/dL (ref 0–40)

## 2020-11-10 LAB — HEPARIN LEVEL (UNFRACTIONATED)
Heparin Unfractionated: 0.16 IU/mL — ABNORMAL LOW (ref 0.30–0.70)
Heparin Unfractionated: 0.14 IU/mL — ABNORMAL LOW (ref 0.30–0.70)

## 2020-11-10 LAB — APTT: aPTT: 48 seconds — ABNORMAL HIGH (ref 24–36)

## 2020-11-10 MED ORDER — HYDROMORPHONE HCL 1 MG/ML IJ SOLN
1.0000 mg | Freq: Once | INTRAMUSCULAR | Status: AC
Start: 2020-11-10 — End: 2020-11-10
  Administered 2020-11-10: 1 mg via INTRAVENOUS
  Filled 2020-11-10: qty 1

## 2020-11-10 MED ORDER — TECHNETIUM TC 99M TETROFOSMIN IV KIT
32.0000 | PACK | Freq: Once | INTRAVENOUS | Status: AC | PRN
  Administered 2020-11-10: 32 via INTRAVENOUS

## 2020-11-10 MED ORDER — HEPARIN BOLUS VIA INFUSION
2000.0000 [IU] | Freq: Once | INTRAVENOUS | Status: AC
  Administered 2020-11-10: 2000 [IU] via INTRAVENOUS
  Filled 2020-11-10: qty 2000

## 2020-11-10 MED ORDER — REGADENOSON 0.4 MG/5ML IV SOLN
INTRAVENOUS | Status: AC
  Filled 2020-11-10: qty 5

## 2020-11-10 MED ORDER — HYDROMORPHONE HCL 1 MG/ML IJ SOLN
0.5000 mg | Freq: Once | INTRAMUSCULAR | Status: AC
  Administered 2020-11-10: 0.5 mg via INTRAVENOUS
  Filled 2020-11-10: qty 1

## 2020-11-10 MED ORDER — MORPHINE SULFATE (PF) 2 MG/ML IV SOLN
2.0000 mg | INTRAVENOUS | Status: DC | PRN
  Administered 2020-11-10 – 2020-11-11 (×4): 2 mg via INTRAVENOUS
  Filled 2020-11-10 (×4): qty 1

## 2020-11-10 MED ORDER — REGADENOSON 0.4 MG/5ML IV SOLN
0.4000 mg | Freq: Once | INTRAVENOUS | Status: AC
  Administered 2020-11-10: 0.4 mg via INTRAVENOUS
  Filled 2020-11-10: qty 5

## 2020-11-10 MED ORDER — PANTOPRAZOLE SODIUM 40 MG PO TBEC
40.0000 mg | DELAYED_RELEASE_TABLET | Freq: Every day | ORAL | Status: DC
  Administered 2020-11-10 – 2020-11-11 (×2): 40 mg via ORAL
  Filled 2020-11-10 (×2): qty 1

## 2020-11-10 MED ORDER — ATORVASTATIN CALCIUM 10 MG PO TABS
10.0000 mg | ORAL_TABLET | Freq: Every day | ORAL | Status: DC
  Administered 2020-11-10: 10 mg via ORAL
  Filled 2020-11-10: qty 1

## 2020-11-10 MED ORDER — IBUPROFEN 600 MG PO TABS
800.0000 mg | ORAL_TABLET | Freq: Three times a day (TID) | ORAL | Status: DC
  Administered 2020-11-10 – 2020-11-11 (×3): 800 mg via ORAL
  Filled 2020-11-10 (×3): qty 1

## 2020-11-10 MED ORDER — ENOXAPARIN SODIUM 40 MG/0.4ML ~~LOC~~ SOLN
40.0000 mg | SUBCUTANEOUS | Status: DC
  Administered 2020-11-10: 40 mg via SUBCUTANEOUS
  Filled 2020-11-10: qty 0.4

## 2020-11-10 MED ORDER — TECHNETIUM TC 99M TETROFOSMIN IV KIT
10.0000 | PACK | Freq: Once | INTRAVENOUS | Status: AC | PRN
  Administered 2020-11-10: 10 via INTRAVENOUS

## 2020-11-10 NOTE — Progress Notes (Signed)
Pt awaken by lab and now complaining of CP. And requesting/demanding iv pain medications. He is refusing tylenol and nitro. MD paged and updated. Updated pt that I have paged MD. Vss. No acute distress noted. Pt resting comfortably in bed.

## 2020-11-10 NOTE — Progress Notes (Signed)
ANTICOAGULATION CONSULT NOTE - Follow Up Consult  Pharmacy Consult for Heparin  Indication: chest pain/ACS  Allergies  Allergen Reactions  . Quinolones Other (See Comments)    Patient was warned about not using Cipro and similar antibiotics. Recent studies have raised concern that fluoroquinolone antibiotics could be associated with an increased risk of aortic aneurysm Fluoroquinolones have non-antimicrobial properties that might jeopardise the integrity of the extracellular matrix of the vascular wall In a  propensity score matched cohort study in [place], there was a 66% increased rate of aortic aneurysm or dissection associated with oral fluoroquinolone use, compared wit    Patient Measurements: Height: 5\' 8"  (172.7 cm) Weight: 74.6 kg (164 lb 7.4 oz) IBW/kg (Calculated) : 68.4 kg Heparin Dosing Weight: 74.8 kg  Vital Signs: Temp: 98.5 F (36.9 C) (04/16 0716) Temp Source: Oral (04/16 0716) BP: 107/68 (04/16 0716) Pulse Rate: 71 (04/16 0716)  Labs: Recent Labs    11/09/20 0928 11/09/20 1140 11/09/20 1910 11/09/20 2124 11/10/20 0301 11/10/20 0505  HGB 15.3  --   --   --  13.5  --   HCT 46.6  --   --   --  39.4  --   PLT 167  --   --   --  141*  --   HEPARINUNFRC  --   --   --   --  0.16*  --   CREATININE 0.71  --   --   --  0.72  --   TROPONINIHS 173*   < > 113* 111*  --  158*   < > = values in this interval not displayed.    Estimated Creatinine Clearance: 91.4 mL/min (by C-G formula based on SCr of 0.72 mg/dL).   Assessment: 65 yo male presents with chest pain and high sensitivity troponin that continued to trend up. PTA the patient is not on anticoagulation. Pharmacy is consulted to dose heparin.   Heparin level is subtherapeutic at 0.14 after a 2000 unit bolus and an infusion running at 1100 units/hr. CBC is WNL. Per the RN, there have been no issues with the infusion and the patient is without signs or symptoms of bleeding.   I discussed extensively with the  nurse, she has not observed any issues with the infusion and there were no issues reported to her by 3rd shift either. The RN states that there is nothing she is aware of that would explain the decreased heparin level after a bolus and rate increase.   Goal of Therapy:  Heparin level 0.3-0.7 units/ml Monitor platelets by anticoagulation protocol: Yes   Plan:  Heparin IV bolus 2000 units x 1 dose Increase heparin IV to 1350 units/hr Obtain a 6-hr heparin level  Obtain daily heparin level and CBC Monitor for signs and symptoms of bleeding   Shauna Hugh, PharmD, [place]  PGY-1 Pharmacy Resident 11/10/2020 7:36 AM  Please check [email or web address] for unit-specific pharmacy phone numbers.

## 2020-11-10 NOTE — Progress Notes (Addendum)
Testing reviewed with Dr. Margaretann Loveless. Nuclear stress test showed minimal thinning inferiorly, consistent with soft tissue attenuation (diaphragm) and probable normal perfusion, no significant scar, LVEF 59%, low risk scan. 2D echo showed normal EF, grade 1 DD, bicuspid AV, ascending aortic aneurysm of 72mm, trivial pericardial effusion. Dr. Margaretann Loveless feels that constellation of EKG, troponin and echo findings are most consistent with pericarditis. Myopericarditis felt less likely given only mild, low level troponin elevation. She recommends initiation of ibuprofen 800mg  TID plus PPI with continuation of colchicine as ordered. (Given interaction between colchicine and statin, will reduce statin dose.) I relayed the testing results and plan for ibuprofen with the patient and he became somewhat upset, stating that ibuprofen does not work for him, he believes he is immune to oral pain medicine, and the only thing that typically helps is IV pain medicine. He states he typically has all over body pain daily and has struggled with chronic pain. We discussed that we have to be careful not to overuse opiate pain medication or give him too high of doses. He stated, "trust me, you are not giving me too much." I discussed the anti-inflammatory role of ibuprofen and he states he is agreeable to try this. We discussed pain management as a potential option in the future. He stated "I will never go back there. They are a bunch of clowns running a circus." His last fill of narcotic per PMDP review was via a hydrocodone/chlorphen suspension on 11/07/20, otherwise not since 07/2020. Per discussion with Dr. Margaretann Loveless she would favor steering away from more potent/higher dose opiates going forward since we are treating per guidelines with anti-inflammatory regimen, but has authorized morphine 2mg  q4hr as needed. Based on observations today there is some concern for DSB so would use judiciously. She also recommends d/c full dose IV heparin and  changing to DVT ppx. Patient agreeable to staying overnight with reassessment/labs and EKG in AM. Discussed plan with nurse.  Paul Susman PA-C

## 2020-11-10 NOTE — Progress Notes (Signed)
[facility] for heparin Indication: chest pain/ACS  Allergies  Allergen Reactions  . Quinolones Other (See Comments)    Patient was warned about not using Cipro and similar antibiotics. Recent studies have raised concern that fluoroquinolone antibiotics could be associated with an increased risk of aortic aneurysm Fluoroquinolones have non-antimicrobial properties that might jeopardise the integrity of the extracellular matrix of the vascular wall In a  propensity score matched cohort study in [place], there was a 66% increased rate of aortic aneurysm or dissection associated with oral fluoroquinolone use, compared wit    Patient Measurements: Height: 5\' 8"  (172.7 cm) Weight: 74.8 kg (165 lb) IBW/kg (Calculated) : 68.4 Heparin Dosing Weight: 74.8  Vital Signs: Temp: 98.4 F (36.9 C) (04/16 0000) Temp Source: Oral (04/16 0000) BP: 99/51 (04/16 0000) Pulse Rate: 84 (04/16 0000)  Labs: Recent Labs    11/09/20 0928 11/09/20 1140 11/09/20 1556 11/09/20 1910 11/09/20 2124 11/10/20 0301  HGB 15.3  --   --   --   --  13.5  HCT 46.6  --   --   --   --  39.4  PLT 167  --   --   --   --  141*  HEPARINUNFRC  --   --   --   --   --  0.16*  CREATININE 0.71  --   --   --   --  0.72  TROPONINIHS 173*   < > 104* 113* 111*  --    < > = values in this interval not displayed.    Estimated Creatinine Clearance: 91.4 mL/min (by C-G formula based on SCr of 0.72 mg/dL).  Assessment: 65 yo male with recurrent CP to start heparin for ACS. Patient received lovenox injection at ~1730 so will not bolus.   Heparin level subtherapeutic (0.16) on gtt at 900 units/hr. No issues with line or bleeding reported per RN.   Goal of Therapy:  Heparin level 0.3-0.7 units/ml Monitor platelets by anticoagulation protocol: Yes   Plan:  Heparin bolus 2000 units Increase heparin drip to 1100 units/hr Heparin level in 6 hours  Sherlon Handing, PharmD, BCPS Please see  amion for complete clinical pharmacist phone list 11/10/2020,3:59 AM

## 2020-11-10 NOTE — Progress Notes (Addendum)
Progress Note  Patient Name: Paul Ray Date of Encounter: 11/10/2020  [facility] Cardiologist: Fransico Him, MD   Subjective   Pt reports COVID infection in Jan with chronic cough since. Chest pain with pleuritic component. He is unsure if CP was improved with colchicine. No chest pain currently, some point tenderness.   Inpatient Medications    Scheduled Meds: . alprazolam  2 mg Oral BID  . aspirin EC  81 mg Oral Daily  . atorvastatin  20 mg Oral QHS  . colchicine  0.6 mg Oral BID  . lamoTRIgine  200 mg Oral q AM  . tamsulosin  0.8 mg Oral Daily   Continuous Infusions: . heparin 1,100 Units/hr (11/10/20 0436)   PRN Meds: acetaminophen, nitroGLYCERIN, ondansetron (ZOFRAN) IV   Vital Signs    Vitals:   11/09/20 2000 11/10/20 0000 11/10/20 0400 11/10/20 0716  BP: (!) 86/75 (!) 99/51 103/68 107/68  Pulse: 75 84 80 71  Resp: 18 18 18 18   Temp: 98.5 F (36.9 C) 98.4 F (36.9 C) 98.6 F (37 C) 98.5 F (36.9 C)  TempSrc: Oral Oral Oral Oral  SpO2: 99% 100% 100% 99%  Weight: 74.8 kg  74.6 kg   Height: 5\' 8"  (1.727 m)       Intake/Output Summary (Last 24 hours) at 11/10/2020 0805 Last data filed at 11/10/2020 0000 Gross per 24 hour  Intake 18.53 ml  Output --  Net 18.53 ml   Last 3 Weights 11/10/2020 11/09/2020 10/02/2020  Weight (lbs) 164 lb 7.4 oz 165 lb 155 lb  Weight (kg) 74.6 kg 74.844 kg 70.308 kg      Telemetry    Sinus rhythm with HR 60-80s, ST elevation - Personally Reviewed  ECG    Sinus rhythm HR 68, ST elevation inferior and lateral leads  - Personally Reviewed  Physical Exam   GEN: No acute distress.   Neck: No JVD Cardiac: RRR, ?light rub Respiratory: Clear to auscultation bilaterally. GI: Soft, nontender, non-distended  MS: No edema; No deformity. Neuro:  Nonfocal  Psych: Normal affect   Labs    High Sensitivity Troponin:   Recent Labs  Lab 11/09/20 1140 11/09/20 1556 11/09/20 1910 11/09/20 2124 11/10/20 0505  TROPONINIHS  155* 104* 113* 111* 158*      Chemistry Recent Labs  Lab 11/09/20 0928 11/10/20 0301  NA 135 132*  K 4.0 3.6  CL 97* 96*  CO2 29 30  GLUCOSE 95 101*  BUN 14 17  CREATININE 0.71 0.72  CALCIUM 8.7* 8.2*  GFRNONAA >60 >60  ANIONGAP 9 6     Hematology Recent Labs  Lab 11/09/20 0928 11/10/20 0301  WBC 7.4 6.0  RBC 5.16 4.50  HGB 15.3 13.5  HCT 46.6 39.4  MCV 90.3 87.6  MCH 29.7 30.0  MCHC 32.8 34.3  RDW 14.0 13.9  PLT 167 141*    BNPNo results for input(s): BNP, PROBNP in the last 168 hours.   DDimer  Recent Labs  Lab 11/09/20 1745  DDIMER 1.42*     Radiology    DG Chest Port 1 View  Result Date: 11/09/2020 CLINICAL DATA:  Triage notes: Pt presents with constant chest pain, headache, since he woke up 2 hrs ago. Denies dizziness, weakness, nausea, vomiting, vision changes EXAM: PORTABLE CHEST 1 VIEW COMPARISON:  08/20/2020. FINDINGS: Cardiac silhouette is normal in size. No mediastinal or hilar masses. Mild linear opacity at the lung bases is consistent with atelectasis. Remainder of the lungs is clear. Hazy  opacities noted on the prior chest radiograph have resolved. No convincing pleural effusion and no pneumothorax. Skeletal structures are grossly intact. IMPRESSION: No acute cardiopulmonary disease. Electronically Signed   By: Lajean Manes M.D.   On: 11/09/2020 12:19   CT Angio Chest Aorta w/CM &/OR wo/CM  Result Date: 11/09/2020 CLINICAL DATA:  Chest pain. Elevated troponin. History of thoracic aortic aneurysm. EXAM: CT ANGIOGRAPHY CHEST WITH CONTRAST TECHNIQUE: Multidetector CT imaging of the chest was performed using the standard protocol during bolus administration of intravenous contrast. Multiplanar CT image reconstructions and MIPs were obtained to evaluate the vascular anatomy. CONTRAST:  64mL OMNIPAQUE IOHEXOL 350 MG/ML SOLN COMPARISON:  08/20/2020 FINDINGS: Cardiovascular: Dilated ascending thoracic aorta to 4.7 cm, unchanged from the prior exam. No  aortic dissection. No significant atherosclerosis. Arch branch vessels are widely patent. Heart is normal in size and configuration. No coronary artery calcifications. No pericardial effusion. Pulmonary arteries are normal in caliber. Pulmonary arteries are well opacified. No evidence of a pulmonary embolism. Mediastinum/Nodes: Visualized thyroid is unremarkable. No neck base or axillary masses or enlarged lymph nodes. Subcentimeter mediastinal and hilar lymph nodes are noted, stable from the prior study. No masses or enlarged lymph nodes. Trachea and esophagus are unremarkable. Lungs/Pleura: Linear/reticular opacities in the dependent lower lobes consistent with atelectasis, improved compared to the prior exam. No convincing pneumonia and no pulmonary edema. No mass or suspicious nodule. No pleural effusion or pneumothorax. Upper Abdomen: No acute abnormality. Musculoskeletal: No acute fracture or acute finding. No bone lesion. Old right-sided rib fractures. Review of the MIP images confirms the above findings. IMPRESSION: 1. Stable, 4.7 cm ascending thoracic aortic aneurysm. No dissection. No atherosclerosis. Ascending thoracic aortic aneurysm. Recommend semi-annual imaging followup by CTA or MRA and referral to cardiothoracic surgery if not already obtained. This recommendation follows 2010 ACCF/AHA/AATS/ACR/ASA/SCA/SCAI/SIR/STS/SVM Guidelines for the Diagnosis and Management of Patients With Thoracic Aortic Disease. Circulation. 2010; 121: S283-T517. Aortic aneurysm NOS (ICD10-I71.9) 2. No acute findings within the chest. 3. Mild dependent atelectasis in the lower lobes which has improved when compared to the prior CT. No evidence of pneumonia or pulmonary edema. Aortic aneurysm NOS (ICD10-I71.9). Electronically Signed   By: Lajean Manes M.D.   On: 11/09/2020 12:18    Cardiac Studies   Echo today   Nuclear stress test today   Echo 05/2020 1. Aortic valve leaflets are not thickened or calcified and  appear to  open well.  2. There is severe ascending aortic aneurysm with maximum diameter 48 mm,  a gated chest CTA is recommended for further evaluation.  3. Left ventricular ejection fraction, by estimation, is 60 to 65%. The  left ventricle has normal function. The left ventricle has no regional  wall motion abnormalities. Left ventricular diastolic parameters are  consistent with Grade I diastolic  dysfunction (impaired relaxation). The average left ventricular global  longitudinal strain is -19.2 %. The global longitudinal strain is normal.  4. Right ventricular systolic function is normal. The right ventricular  size is normal. There is normal pulmonary artery systolic pressure.  5. The mitral valve is normal in structure. Mild mitral valve  regurgitation. No evidence of mitral stenosis.  6. The aortic valve is normal in structure. Aortic valve regurgitation is  trivial. No aortic stenosis is present.  7. Aortic dilatation noted. There is severe dilatation of the ascending  aorta, measuring 48 mm.  8. The inferior vena cava is normal in size with greater than 50%  respiratory variability, suggesting right atrial pressure of  3 mmHg.  Patient Profile     65 y.o. male with a hx of thoracic aortic aneurysm, cervical stenosis with chronic right shoulder pain, bipolar disorder, and ADD who is seen today for the evaluation of chest pain.  Assessment & Plan    Chest pain - hs troponin 173 --> 155 --> 104 --> 113 --> 111 --> 158 - hx of coronary calcium score of zero 05/2020 - EKG with diffuse ST elevations inferior and lateral leads - CTA negative for PE - CRP 13.7 - suspect pericarditis vs myocarditis process - hx of COVID infection in Jan 2022 - Sed rate WNL  - pleuritic chest pain described  - colchicine started last evening - has received 1 dose - not sure it his has helped - plan for myoview this morning (no ability to complete CT coronary today) - will await  echocardiogram   Ascending thoracic aortic aneurysm - stable at 4.7 cm on CTA   Hyperlipidemia with LDL goal < 70 (AAA) 11/10/2020: Cholesterol 97; HDL 52; LDL Cholesterol 37; Triglycerides 40; VLDL 8 Continue 20 mg lipitor   Hx of right thoracotomy - ?pleurodesis for recurrent pleural effusion 10 years ago   For questions or updates, please contact [facility] Please consult [email or web address] for contact info under        Signed, Ledora Bottcher, PA  11/10/2020, 8:05 AM    Patient seen and examined with Doreene Adas PA.  Agree as above, with the following exceptions and changes as noted below. Patient anxious to get an answer but also anxious to have tests done this morning. Gen: NAD, CV: RRR, no murmurs, Lungs: clear, Abd: soft, Extrem: Warm, well perfused, no edema, Neuro/Psych: alert and oriented x 3, normal mood and affect. All available labs, radiology testing, previous records reviewed. Will plan for lexiscan nuclear study since CCTA unavailable this weekend. Will also obtain echo. Strongly suspect pericarditis given ECG changes however will perform ischemic evaluation.   >35 minutes of encounter time independent of PA, including discussions with prior service, coordination of testing, and >50% of that time spent face to face with patient discussing his history and current care.  Elouise Munroe, MD 11/10/20 9:04 AM

## 2020-11-10 NOTE — Progress Notes (Signed)
Patient taken for stress test via WC at 0955hrs.

## 2020-11-10 NOTE — Progress Notes (Signed)
  Echocardiogram 2D Echocardiogram has been performed.  Paul Ray F 11/10/2020, 2:03 PM

## 2020-11-10 NOTE — Progress Notes (Signed)
   [name] presented for a nuclear stress test today.  No immediate complications.  Stress imaging is pending at this time.  Preliminary EKG findings may be listed in the chart, but the stress test result will not be finalized until perfusion imaging is complete.  Tami Lin Yianni Skilling, PA-C 11/10/2020, 11:24 AM

## 2020-11-10 NOTE — Plan of Care (Signed)

## 2020-11-10 NOTE — Progress Notes (Signed)
Patient returned from stress test at 1215hrs.

## 2020-11-10 NOTE — Progress Notes (Signed)
ANTICOAGULATION CONSULT NOTE - Initial Consult  Pharmacy Consult for enoxaparin  Indication: VTE prophylaxis  Allergies  Allergen Reactions  . Quinolones Other (See Comments)    Patient was warned about not using Cipro and similar antibiotics. Recent studies have raised concern that fluoroquinolone antibiotics could be associated with an increased risk of aortic aneurysm Fluoroquinolones have non-antimicrobial properties that might jeopardise the integrity of the extracellular matrix of the vascular wall In a  propensity score matched cohort study in [place], there was a 66% increased rate of aortic aneurysm or dissection associated with oral fluoroquinolone use, compared wit    Patient Measurements: Height: 5\' 8"  (172.7 cm) Weight: 74.6 kg (164 lb 7.4 oz) IBW/kg (Calculated) : 68.4   Vital Signs: Temp: 98.8 F (37.1 C) (04/16 1615) Temp Source: Oral (04/16 1615) BP: 118/84 (04/16 1615) Pulse Rate: 85 (04/16 1615)  Labs: Recent Labs    11/09/20 0928 11/09/20 1140 11/09/20 1910 11/09/20 2124 11/10/20 0301 11/10/20 0505 11/10/20 0930 11/10/20 1119  HGB 15.3  --   --   --  13.5  --   --   --   HCT 46.6  --   --   --  39.4  --   --   --   PLT 167  --   --   --  141*  --   --   --   APTT  --   --   --   --   --   --   --  48*  HEPARINUNFRC  --   --   --   --  0.16*  --  0.14*  --   CREATININE 0.71  --   --   --  0.72  --   --   --   TROPONINIHS 173*   < > 113* 111*  --  158*  --   --    < > = values in this interval not displayed.    Estimated Creatinine Clearance: 91.4 mL/min (by C-G formula based on SCr of 0.72 mg/dL).   Medical History: Past Medical History:  Diagnosis Date  . Abscess of lung(513.0)    Right lower lobe  . ADD (attention deficit disorder)   . Anxiety   . Arthritis   . Ascending aortic aneurysm ([place])    36mm by Chest CTA 05/2020 and 58mm by echo 05/2020  . Benign essential HTN   . Bipolar depression ([place])   . Coronary artery calcification  seen on CAT scan   . Empyema lung ([place])   . Hemorrhoids   . Hepatitis C   . Pneumonia    last year  . Prostate abscess    Assessment: 65 yo M with myopericarditis. Patient was initially started on heparin for ACS r/o, now pharmacy consulted for DVT prophylaxis. Per discussion with team, can use enoxaparin.   Good renal function. Pat[place] received heparin 2000 unit bolus at 12:20 and was on 1300 units/hr until 16:55. APTT before bolus and dose increase was 48. Will start enoxaparin for prophylaxis tonight.   Goal of Therapy:  Monitor platelets by anticoagulation protocol: Yes   Plan:  Stop heparin  Start enoxaparin 40 mg Q24 hr at 22:00  Monitor for signs/symptoms of bleeding    Benetta Spar, PharmD, BCPS, BCCP Clinical Pharmacist  Please check AMION for all [place] phone numbers After 10:00 PM, call [place] [phone]

## 2020-11-10 NOTE — Progress Notes (Signed)
Called to see patient for ongoing chest pain, 8/10 which he states has been constant since this AM.  It eased off shortly for a while after IV dilaudid. There is point tenderness to his mid chest as well as some chest pain with inspiration. He states he otherwise feels good and wants to eat. He is requesting additional pain medication. Discussed with Dr. Margaretann Loveless given abnormal EKG/troponins. She feels most likely dx is still pericarditis and would like to use Toradol but need to ensure nuclear stress test is OK first - therefore she has granted a one time lower dose of Dilaudid while we await results. Patient states he's eager to go home but also eager to get an answer of what's going on so he can return to karate and other physical activity. I did tell him that regardless of the testing results he would want to hold off on strenuous physical activity until seen back in the office for further discussion of next steps. Await echo/nuc. Per bedside d/w echo tech, no effusion seen.  Gottfried Standish PA-C

## 2020-11-10 NOTE — Discharge Summary (Addendum)
Discharge Summary    Patient ID: Paul Ray MRN: [id number]; DOB: 1956/01/08  Admit date: 11/09/2020 Discharge date: 11/11/2020  PCP:  Shirline Frees, [facility]  Cardiologist:  Fransico Him, MD    Discharge Diagnoses    Principal Problem:   Chest pain Active Problems:   Bipolar depression ([place])   ADD (attention deficit disorder)   Hepatitis C   Ascending aortic aneurysm ([place])   Benign essential HTN   Coronary artery calcification seen on CAT scan    Diagnostic Studies/Procedures    Echo 11/10/20: 1. Left ventricular ejection fraction, by estimation, is 60 to 65%. The  left ventricle has normal function. The left ventricle has no regional  wall motion abnormalities. There is mild left ventricular hypertrophy.  Left ventricular diastolic parameters  are consistent with Grade I diastolic dysfunction (impaired relaxation).   2. Right ventricular systolic function is normal. The right ventricular  size is normal. Tricuspid regurgitation signal is inadequate for assessing  PA pressure.   3. The mitral valve is normal in structure. Trivial mitral valve  regurgitation. No evidence of mitral stenosis.   4. The aortic valve is previously reported as bicuspid, and appears to  have fusion of right and left coronary cusps. Aortic valve regurgitation  is mild. No aortic stenosis is present.   5. Aortic dilatation noted. Aneurysm of the ascending aorta, measuring 47  mm.   6. Trivial pericardial effusion.   _____________  Nuclear stress test 11/10/20: Carlton Adam stress: Baseline EKG shows relatively diffuse ST elevation. This is unchanged during infusion and into recovery. Overall electrically equivical due to baseline changes. Myoview scan shows minimal thinning inferiorly. consistent with soft tissue attenuation (diaphragm) and probable normal perfusion No significant or scar LVEF calculated at 59% Low risk scan    History of Present Illness      Paul Ray is a 65 y.o. male with a hx of thoracic aortic aneurysm, cervical stenosis with chronic right shoulder pain, Bipolar Disorder , and ADD who is being seen today for the evaluation of chest pain.   Paul Ray had Initial onset of pain was approximately 6 hours prior to admission. The patient's chest pain is well-localized, is sharp and is not worse with exertion. The patient's chest pain is in middle of his sternum , is not described as heaviness/pressure/tightness and does not radiate to the arms/jaw/neck. The patient does not complain of nausea and denies diaphoresis. The patient has no history of stroke, has no history of peripheral artery disease, has not smoked in the past 90 days, denies any history of treated diabetes, mother and father had heart disease, has no lipid panel on file, and does not have an elevated BMI (>=30). He did lift a lot of scrap metal on Monday, but denies any more recent strenuous lifting. He has been anxious and frustrated he can not focus enough to clean his home or yard. Has had some intermittent sputum production since December after a mild Covid-19 infection. Denies any history of GERD , gastric ulcers, emesis, or difficulty swallowing.    Hospital Course     Consultants: none  Chest pain Pericarditis He was admitted to cardiology for further workup. Initial concern for MSK given his activity earlier in the week. However, did have troponin elevation. CE low and flat: 173 --> 158 Hx of coronary calcium score of zero 05/2020. EKG with diffuse St elevation in inferior and lateral leads. CTA negative for PE. CRP 13.7,  sed rate WNL. Suspect a pericarditis vs myocarditis process. He did have COVID in Jan 2022 and has had chronic cough since. He has a history or right thoracotomy for what sounds like pleurodesis following recurrent pleural effusion secondary to PNA about 10 years ago. He has continued to have chronic right chest wall pain that is worse off of  narcotics. CP with a pleuritic component and point tenderness on exam. Colchocine was started for suspicion of pericarditis. CT coronary was unavailable today.  He underwent nuclear stress test which revealed normal perfusion, no ischemia. Echocardiogram was obtained and showed EF 60-65%, mild LVH, grade 1 DD, trivial MR, aortic valve previously reported as bicuspid - appears to have fusion of right and left coronary cusps. AAA 4.7 mm, and trivial pericardial effusion. Pt does state CP resolves with dilaudid.   Given the above, suspect pericarditis. He is discharged with colchicine and ibuprofen. Will not prescribe narcotic pain medication.    Ascending thoracic aortic aneurysm.  Stable 4.7 cm on CTA.  Continue routine surveillance and BP control. Echo this admission with AAA 4.7 cm.    Hyperlipidemia with LDL goal < 70 (AAA) 11/10/2020: Cholesterol 97; HDL 52; LDL Cholesterol 37; Triglycerides 40; VLDL 8 Continue 20 mg lipitor.   I have arranged for follow up.   MD addendum: consider cardiac MRI as outpatient if clinically relevant for abnormal ECG, if pericarditis symptoms are not improving or concern for myocarditis is present.    Did the patient have an acute coronary syndrome (MI, NSTEMI, STEMI, etc) this admission?:  No                               Did the patient have a percutaneous coronary intervention (stent / angioplasty)?:  No.       _____________  Discharge Vitals Blood pressure 102/69, pulse 63, temperature 98.8 F (37.1 C), temperature source Oral, resp. rate 18, height 5\' 8"  (1.727 m), weight 74.6 kg, SpO2 96 %.  Filed Weights   11/09/20 2000 11/10/20 0400  Weight: 74.8 kg 74.6 kg    Labs & Radiologic Studies    CBC Recent Labs    11/09/20 0928 11/10/20 0301 11/11/20 0601  WBC 7.4 6.0 5.6  NEUTROABS 5.6  --   --   HGB 15.3 13.5 13.9  HCT 46.6 39.4 42.0  MCV 90.3 87.6 89.4  PLT 167 141* 387   Basic Metabolic Panel Recent Labs    11/10/20 0301  11/11/20 0601  NA 132* 136  K 3.6 3.8  CL 96* 101  CO2 30 29  GLUCOSE 101* 97  BUN 17 14  CREATININE 0.72 0.71  CALCIUM 8.2* 8.6*   Liver Function Tests No results for input(s): AST, ALT, ALKPHOS, BILITOT, PROT, ALBUMIN in the last 72 hours. No results for input(s): LIPASE, AMYLASE in the last 72 hours. High Sensitivity Troponin:   Recent Labs  Lab 11/09/20 1556 11/09/20 1910 11/09/20 2124 11/10/20 0505 11/11/20 0601  TROPONINIHS 104* 113* 111* 158* 51*    BNP Invalid input(s): POCBNP D-Dimer Recent Labs    11/09/20 1745  DDIMER 1.42*   Hemoglobin A1C No results for input(s): HGBA1C in the last 72 hours. Fasting Lipid Panel Recent Labs    11/10/20 0301  CHOL 97  HDL 52  LDLCALC 37  TRIG 40  CHOLHDL 1.9   Thyroid Function Tests No results for input(s): TSH, T4TOTAL, T3FREE, THYROIDAB in the last 72 hours.  Invalid  input(s): FREET3 _____________  NM Myocar Multi W/Spect W/Wall Motion / EF  Result Date: 11/10/2020  Lexiscan stress: Baseline EKG shows relatively diffuse ST elevation. This is unchanged during infusion and into recovery. Overall electrically equivical due to baseline changes.  Myoview scan shows minimal thinning inferiorly. consistent with soft tissue attenuation (diaphragm) and probable normal perfusion No significant or scar  LVEF calculated at 59%  Low risk scan    DG Chest Port 1 View  Result Date: 11/09/2020 CLINICAL DATA:  Triage notes: Pt presents with constant chest pain, headache, since he woke up 2 hrs ago. Denies dizziness, weakness, nausea, vomiting, vision changes EXAM: PORTABLE CHEST 1 VIEW COMPARISON:  08/20/2020. FINDINGS: Cardiac silhouette is normal in size. No mediastinal or hilar masses. Mild linear opacity at the lung bases is consistent with atelectasis. Remainder of the lungs is clear. Hazy opacities noted on the prior chest radiograph have resolved. No convincing pleural effusion and no pneumothorax. Skeletal structures are  grossly intact. IMPRESSION: No acute cardiopulmonary disease. Electronically Signed   By: Lajean Manes M.D.   On: 11/09/2020 12:19   CT Angio Chest Aorta w/CM &/OR wo/CM  Result Date: 11/09/2020 CLINICAL DATA:  Chest pain. Elevated troponin. History of thoracic aortic aneurysm. EXAM: CT ANGIOGRAPHY CHEST WITH CONTRAST TECHNIQUE: Multidetector CT imaging of the chest was performed using the standard protocol during bolus administration of intravenous contrast. Multiplanar CT image reconstructions and MIPs were obtained to evaluate the vascular anatomy. CONTRAST:  37mL OMNIPAQUE IOHEXOL 350 MG/ML SOLN COMPARISON:  08/20/2020 FINDINGS: Cardiovascular: Dilated ascending thoracic aorta to 4.7 cm, unchanged from the prior exam. No aortic dissection. No significant atherosclerosis. Arch branch vessels are widely patent. Heart is normal in size and configuration. No coronary artery calcifications. No pericardial effusion. Pulmonary arteries are normal in caliber. Pulmonary arteries are well opacified. No evidence of a pulmonary embolism. Mediastinum/Nodes: Visualized thyroid is unremarkable. No neck base or axillary masses or enlarged lymph nodes. Subcentimeter mediastinal and hilar lymph nodes are noted, stable from the prior study. No masses or enlarged lymph nodes. Trachea and esophagus are unremarkable. Lungs/Pleura: Linear/reticular opacities in the dependent lower lobes consistent with atelectasis, improved compared to the prior exam. No convincing pneumonia and no pulmonary edema. No mass or suspicious nodule. No pleural effusion or pneumothorax. Upper Abdomen: No acute abnormality. Musculoskeletal: No acute fracture or acute finding. No bone lesion. Old right-sided rib fractures. Review of the MIP images confirms the above findings. IMPRESSION: 1. Stable, 4.7 cm ascending thoracic aortic aneurysm. No dissection. No atherosclerosis. Ascending thoracic aortic aneurysm. Recommend semi-annual imaging followup by  CTA or MRA and referral to cardiothoracic surgery if not already obtained. This recommendation follows 2010 ACCF/AHA/AATS/ACR/ASA/SCA/SCAI/SIR/STS/SVM Guidelines for the Diagnosis and Management of Patients With Thoracic Aortic Disease. Circulation. 2010; 121: J941-D408. Aortic aneurysm NOS (ICD10-I71.9) 2. No acute findings within the chest. 3. Mild dependent atelectasis in the lower lobes which has improved when compared to the prior CT. No evidence of pneumonia or pulmonary edema. Aortic aneurysm NOS (ICD10-I71.9). Electronically Signed   By: Lajean Manes M.D.   On: 11/09/2020 12:18   ECHOCARDIOGRAM COMPLETE  Result Date: 11/10/2020    ECHOCARDIOGRAM REPORT   Patient Name:   Aseem JAN OLANO Date of Exam: 11/10/2020 Medical Rec #:  [id number]   Height:       68.0 in Accession #:    [id number]  Weight:       164.5 lb Date of Birth:  1956-01-21   BSA:  1.881 m Patient Age:    65 years    BP:           118/81 mmHg Patient Gender: M           HR:           76 bpm. Exam Location:  Inpatient Procedure: 2D Echo, Cardiac Doppler and Color Doppler Indications:    R07.9* Chest pain, unspecified  History:        Patient has prior history of Echocardiogram examinations, most                 recent 06/05/2020. Signs/Symptoms:Chest Pain. H/O Covid.                 Pericarditis like symptoms.  Sonographer:    Merrie Roof RDCS Referring Phys: [id number] [place]  1. Left ventricular ejection fraction, by estimation, is 60 to 65%. The left ventricle has normal function. The left ventricle has no regional wall motion abnormalities. There is mild left ventricular hypertrophy. Left ventricular diastolic parameters are consistent with Grade I diastolic dysfunction (impaired relaxation).  2. Right ventricular systolic function is normal. The right ventricular size is normal. Tricuspid regurgitation signal is inadequate for assessing PA pressure.  3. The mitral valve is normal in structure. Trivial mitral  valve regurgitation. No evidence of mitral stenosis.  4. The aortic valve is previously reported as bicuspid, and appears to have fusion of right and left coronary cusps. Aortic valve regurgitation is mild. No aortic stenosis is present.  5. Aortic dilatation noted. Aneurysm of the ascending aorta, measuring 47 mm.  6. Trivial pericardial effusion. FINDINGS  Left Ventricle: Left ventricular ejection fraction, by estimation, is 60 to 65%. The left ventricle has normal function. The left ventricle has no regional wall motion abnormalities. The left ventricular internal cavity size was normal in size. There is  mild left ventricular hypertrophy. Left ventricular diastolic parameters are consistent with Grade I diastolic dysfunction (impaired relaxation). Right Ventricle: The right ventricular size is normal. No increase in right ventricular wall thickness. Right ventricular systolic function is normal. Tricuspid regurgitation signal is inadequate for assessing PA pressure. Left Atrium: Left atrial size was normal in size. Right Atrium: Right atrial size was normal in size. Pericardium: Trivial pericardial effusion is present. Mitral Valve: The mitral valve is normal in structure. Trivial mitral valve regurgitation. No evidence of mitral valve stenosis. Tricuspid Valve: The tricuspid valve is normal in structure. Tricuspid valve regurgitation is not demonstrated. No evidence of tricuspid stenosis. Aortic Valve: The aortic valve is bicuspid. Aortic valve regurgitation is mild. No aortic stenosis is present. Aortic valve mean gradient measures 5.0 mmHg. Aortic valve peak gradient measures 8.2 mmHg. Aortic valve area, by VTI measures 3.85 cm. Pulmonic Valve: The pulmonic valve was normal in structure. Pulmonic valve regurgitation is trivial. No evidence of pulmonic stenosis. Aorta: The aortic root is normal in size and structure and aortic dilatation noted. There is an aneurysm involving the ascending aorta measuring 47  mm. Venous: The inferior vena cava was not well visualized. IAS/Shunts: No atrial level shunt detected by color flow Doppler.  LEFT VENTRICLE PLAX 2D LVIDd:         4.10 cm  Diastology LVIDs:         2.40 cm  LV e' medial:    8.38 cm/s LV PW:         1.00 cm  LV E/e' medial:  11.9 LV IVS:  1.20 cm  LV e' lateral:   11.20 cm/s LVOT diam:     2.10 cm  LV E/e' lateral: 8.9 LV SV:         111 LV SV Index:   59 LVOT Area:     3.46 cm  RIGHT VENTRICLE RV Basal diam:  4.00 cm LEFT ATRIUM             Index       RIGHT ATRIUM           Index LA diam:        3.60 cm 1.91 cm/m  RA Area:     24.70 cm LA Vol (A2C):   98.3 ml 52.27 ml/m RA Volume:   76.00 ml  40.41 ml/m LA Vol (A4C):   46.0 ml 24.46 ml/m LA Biplane Vol: 70.6 ml 37.54 ml/m  AORTIC VALVE AV Area (Vmax):    3.78 cm AV Area (Vmean):   3.67 cm AV Area (VTI):     3.85 cm AV Vmax:           143.00 cm/s AV Vmean:          101.000 cm/s AV VTI:            0.289 m AV Peak Grad:      8.2 mmHg AV Mean Grad:      5.0 mmHg LVOT Vmax:         156.00 cm/s LVOT Vmean:        107.000 cm/s LVOT VTI:          0.321 m LVOT/AV VTI ratio: 1.11  AORTA Ao Root diam: 3.40 cm Ao Asc diam:  4.70 cm MITRAL VALVE MV Area (PHT): 4.63 cm     SHUNTS MV Decel Time: 164 msec     Systemic VTI:  0.32 m MV E velocity: 99.40 cm/s   Systemic Diam: 2.10 cm MV A velocity: 111.00 cm/s MV E/A ratio:  0.90 Cherlynn Kaiser MD Electronically signed by Cherlynn Kaiser MD Signature Date/Time: 11/10/2020/2:14:02 PM    Final    Disposition   Pt is being discharged home today in good condition.  Follow-up Plans & Appointments     Follow-up Information     Sueanne Margarita, MD Follow up on 12/19/2020.   Specialty: Cardiology Why: 9:40 am Contact information: [address] [place] [address] [phone]                Discharge Instructions     Diet - low sodium heart healthy   Complete by: As directed    Increase activity slowly   Complete by: As  directed        Discharge Medications   Allergies as of 11/11/2020       Reactions   Quinolones Other (See Comments)   Patient was warned about not using Cipro and similar antibiotics. Recent studies have raised concern that fluoroquinolone antibiotics could be associated with an increased risk of aortic aneurysm Fluoroquinolones have non-antimicrobial properties that might jeopardise the integrity of the extracellular matrix of the vascular wall In a  propensity score matched cohort study in [place], there was a 66% increased rate of aortic aneurysm or dissection associated with oral fluoroquinolone use, compared wit        Medication List     STOP taking these medications    alprazolam 2 MG tablet Commonly known as: XANAX       TAKE these medications    atorvastatin  20 MG tablet Commonly known as: LIPITOR Take 1 tablet (20 mg total) by mouth daily. What changed: when to take this   colchicine 0.6 MG tablet Take 1 tablet (0.6 mg total) by mouth 2 (two) times daily.   gabapentin 300 MG capsule Commonly known as: Neurontin Take 1 capsule (300 mg total) by mouth at bedtime.   ibuprofen 800 MG tablet Commonly known as: ADVIL Take 1 tablet (800 mg total) by mouth 3 (three) times daily for 14 days.   lamoTRIgine 200 MG tablet Commonly known as: LAMICTAL Take 200 mg by mouth in the morning.   pantoprazole 40 MG tablet Commonly known as: PROTONIX Take 1 tablet (40 mg total) by mouth daily. Start taking on: November 12, 2020   tamsulosin 0.4 MG Caps capsule Commonly known as: FLOMAX Take 0.8 mg by mouth daily.           Outstanding Labs/Studies     Duration of Discharge Encounter   Greater than 30 minutes including physician time.  Signed, Tami Lin Duke, PA 11/11/2020, 10:50 AM

## 2020-11-11 LAB — CBC
HCT: 42 % (ref 39.0–52.0)
Hemoglobin: 13.9 g/dL (ref 13.0–17.0)
MCH: 29.6 pg (ref 26.0–34.0)
MCHC: 33.1 g/dL (ref 30.0–36.0)
MCV: 89.4 fL (ref 80.0–100.0)
Platelets: 157 10*3/uL (ref 150–400)
RBC: 4.7 MIL/uL (ref 4.22–5.81)
RDW: 13.6 % (ref 11.5–15.5)
WBC: 5.6 10*3/uL (ref 4.0–10.5)
nRBC: 0 % (ref 0.0–0.2)

## 2020-11-11 LAB — BASIC METABOLIC PANEL
Anion gap: 6 (ref 5–15)
BUN: 14 mg/dL (ref 8–23)
CO2: 29 mmol/L (ref 22–32)
Calcium: 8.6 mg/dL — ABNORMAL LOW (ref 8.9–10.3)
Chloride: 101 mmol/L (ref 98–111)
Creatinine, Ser: 0.71 mg/dL (ref 0.61–1.24)
GFR, Estimated: >60 mL/min (ref >60)
Glucose, Bld: 97 mg/dL (ref 70–99)
Potassium: 3.8 mmol/L (ref 3.5–5.1)
Sodium: 136 mmol/L (ref 135–145)

## 2020-11-11 LAB — TROPONIN I (HIGH SENSITIVITY): Troponin I (High Sensitivity): 51 ng/L — ABNORMAL HIGH (ref <18)

## 2020-11-11 LAB — SEDIMENTATION RATE: Sed Rate: 5 mm/hr (ref 0–16)

## 2020-11-11 LAB — C-REACTIVE PROTEIN: CRP: 9.5 mg/dL — ABNORMAL HIGH (ref <1.0)

## 2020-11-11 MED ORDER — PANTOPRAZOLE SODIUM 40 MG PO TBEC: 40.0000 mg | DELAYED_RELEASE_TABLET | Freq: Every day | ORAL | 3 refills | Status: DC

## 2020-11-11 MED ORDER — COLCHICINE 0.6 MG PO TABS: 0.6000 mg | ORAL_TABLET | Freq: Two times a day (BID) | ORAL | 0 refills | Status: DC

## 2020-11-11 MED ORDER — IBUPROFEN 800 MG PO TABS: 800.0000 mg | ORAL_TABLET | Freq: Three times a day (TID) | ORAL | 0 refills | Status: AC

## 2020-11-11 NOTE — Discharge Instructions (Signed)
Pericarditis  Pericarditis is inflammation of the pericardium. The pericardium is a thin, double-layered, fluid-filled sac that surrounds your heart. The pericardium protects and holds your heart in your chest cavity. Inflammation of your pericardium can cause rubbing (friction) between the two layers when your heart beats. Fluid may also build up between the layers of the sac (pericardial effusion). There are three types of this condition:  Acute pericarditis. Inflammation develops suddenly and causes pericardial effusion.  Chronic pericarditis. Inflammation may develop slowly, or it may continue after acute pericarditis and last longer than 6 months.  Constrictive pericarditis (rare). The layers of the pericardium stiffen and develop scar tissue. The scar tissue thickens and sticks together. This makes it difficult for the heart to work in a normal way. In most cases, pericarditis is acute and not serious. Chronic pericarditis and constrictive pericarditis are more serious and may require treatment. What are the causes? In most cases, the cause of this condition is not known. If a cause is found, it may be:  An infection from a virus.  A heart attack (myocardial infarction).  Problems after open-heart surgery.  Chest injury.  Autoimmune disorder. The body's defense system (immune system) attacks healthy tissues. Examples include lupus or rheumatoid arthritis.  Kidney failure.  Underactive thyroid gland (hypothyroidism).  Cancer that has spread (metastasized) to the pericardium from another part of the body.  Treatment using high-energy X-rays (radiation).  Certain medicines, including some seizure medicines, blood thinners, heart medicines, and antibiotics.  Infection from a fungus or bacteria (rare). What increases the risk? The following factors may make you more likely to develop this condition:  Being male.  Being 75-71 years old.  Having had pericarditis  before.  Having had a recent upper respiratory tract infection. What are the signs or symptoms? The main symptom of this condition is chest pain. The chest pain may:  Be in the center or the left side of your chest.  Not go away with rest.  Last for many hours or days.  Worsen when you lie down and get better when you sit up and lean forward.  Worsen when you swallow.  Move to your back, neck, or shoulder. Other symptoms may include:  A chronic, dry cough.  Heart palpitations. These may feel like rapid, fluttering, or pounding heartbeats.  Dizziness or fainting.  Tiredness or fatigue.  Fever.  Rapid breathing.  Shortness of breath when lying down. How is this diagnosed? This condition is diagnosed based on medical history, physical exam, and diagnostic tests. During your physical exam, your health care provider will listen for friction while your heart beats (pericardial rub). You may also have tests, including:  Blood tests to look for signs of infection and inflammation.  Electrocardiogram (ECG). This test measures the electrical activity in your heart.  Echocardiogram. This uses sound waves to make images of your heart.  CT scan.  MRI.  Culture of pericardial fluid.  Removing a tissue sample of the pericardium to look at under a microscope (biopsy). If tests show that you may have constrictive pericarditis, a small, thin tube may be inserted into your heart to confirm the diagnosis (cardiac catheterization). How is this treated? Treatment for this condition depends on the cause and type of pericarditis that you have. In most cases, acute pericarditis will clear up on its own. Treatment may include:  Limiting physical activity.  Medicines, such as: ? NSAIDs to relieve pain and inflammation. ? Steroids to reduce inflammation. ? Colchicine to relieve pain  and inflammation.  A procedure to remove fluid using a needle (pericardiocentesis) if fluid buildup puts  pressure on the heart.  Surgery to remove part of the pericardium if constrictive pericarditis develops. If another condition is causing your pericarditis, you may also need treatment for that condition. Follow these instructions at home:  Limit your activity as told by your health care provider until your symptoms improve. This usually includes: ? Resting or sitting for most of the day. ? No long walks. ? No exercise. ? No sports.  Athletes may need to limit competition for several months.  Do not use any products that contain nicotine or tobacco, such as cigarettes and e-cigarettes. If you need help quitting, ask your health care provider.  Eat a heart-healthy diet. Ask your dietitian what foods are healthy for your heart.  Take over-the-counter and prescription medicines only as told by your health care provider. ? If you were prescribed an antibiotic medicine, take it as told by your health care provider. Do not stop taking the antibiotic even if you start to feel better.  Keep all follow-up visits as told by your health care provider. This is important. Contact a health care provider if:  You continue to have symptoms of pericarditis.  You develop new symptoms of pericarditis.  Your symptoms get worse.  You have a fever. Get help right away if:  You have worsening chest pain.  You have difficulty breathing.  You pass out. These symptoms may represent a serious problem that is an emergency. Do not wait to see if the symptoms will go away. Get medical help right away. Call your local emergency services (911 in the [place] Do not drive yourself to the hospital. Summary  Pericarditis is inflammation of the pericardium.  The pericardium is a thin, double-layered, fluid-filled sac that surrounds your heart.  The main symptom of this condition is chest pain.  Treatment for this condition depends on the cause and type of pericarditis that you have. This information is not  intended to replace advice given to you by your health care provider. Make sure you discuss any questions you have with your health care provider. Document Revised: 08/20/2017 Document Reviewed: 08/20/2017 Elsevier Patient Education  2021 [employer].

## 2020-11-11 NOTE — Progress Notes (Signed)
Progress Note  Patient Name: Paul Ray Date of Encounter: 11/11/2020  [facility] Cardiologist: Fransico Him, MD   Subjective   Sleeping comfortably on exam. Stable telemetry and ECG suggestive of pericarditis, troponin downtrending. I asked if he feels ready to go home this morning, and said yes. Inquires which pain medication he will get this morning. We discussed treatment of pericardial pain with colchicine and ibuprofen.   Inpatient Medications    Scheduled Meds: . alprazolam  2 mg Oral BID  . aspirin EC  81 mg Oral Daily  . atorvastatin  10 mg Oral QHS  . colchicine  0.6 mg Oral BID  . enoxaparin (LOVENOX) injection  40 mg Subcutaneous Q24H  . ibuprofen  800 mg Oral Q8H  . lamoTRIgine  200 mg Oral q AM  . pantoprazole  40 mg Oral Daily  . tamsulosin  0.8 mg Oral Daily   Continuous Infusions:  PRN Meds: acetaminophen, morphine injection, nitroGLYCERIN, ondansetron (ZOFRAN) IV   Vital Signs    Vitals:   11/10/20 1615 11/10/20 1958 11/11/20 0557 11/11/20 0752  BP: 118/84 116/75 103/71 102/69  Pulse: 85 70 62 63  Resp: 18 20 18 18   Temp: 98.8 F (37.1 C) 98.8 F (37.1 C) 98.8 F (37.1 C) 98.8 F (37.1 C)  TempSrc: Oral Oral Oral Oral  SpO2: 97% 99% 99% 96%  Weight:      Height:        Intake/Output Summary (Last 24 hours) at 11/11/2020 0843 Last data filed at 11/10/2020 1900 Gross per 24 hour  Intake 695.52 ml  Output --  Net 695.52 ml   Last 3 Weights 11/10/2020 11/09/2020 10/02/2020  Weight (lbs) 164 lb 7.4 oz 165 lb 155 lb  Weight (kg) 74.6 kg 74.844 kg 70.308 kg      Telemetry    SR, ST elevations - Personally Reviewed  ECG    Sinus rhythm HR 62, ST elevation inferior and lateral leads, ST depressions avR  - Personally Reviewed  Physical Exam   GEN: No acute distress.   Neck: No JVD Cardiac: RRR, no murmurs, rubs, or gallops.  Respiratory: Clear to auscultation bilaterally. GI: Soft, nontender, non-distended  MS: No edema; No  deformity. Neuro:  Nonfocal  Psych: Normal affect   Labs    High Sensitivity Troponin:   Recent Labs  Lab 11/09/20 1556 11/09/20 1910 11/09/20 2124 11/10/20 0505 11/11/20 0601  TROPONINIHS 104* 113* 111* 158* 51*      Chemistry Recent Labs  Lab 11/09/20 0928 11/10/20 0301 11/11/20 0601  NA 135 132* 136  K 4.0 3.6 3.8  CL 97* 96* 101  CO2 29 30 29   GLUCOSE 95 101* 97  BUN 14 17 14   CREATININE 0.71 0.72 0.71  CALCIUM 8.7* 8.2* 8.6*  GFRNONAA >60 >60 >60  ANIONGAP 9 6 6      Hematology Recent Labs  Lab 11/09/20 0928 11/10/20 0301 11/11/20 0601  WBC 7.4 6.0 5.6  RBC 5.16 4.50 4.70  HGB 15.3 13.5 13.9  HCT 46.6 39.4 42.0  MCV 90.3 87.6 89.4  MCH 29.7 30.0 29.6  MCHC 32.8 34.3 33.1  RDW 14.0 13.9 13.6  PLT 167 141* 157    BNPNo results for input(s): BNP, PROBNP in the last 168 hours.   DDimer  Recent Labs  Lab 11/09/20 1745  DDIMER 1.42*     Radiology    NM Myocar Multi W/Spect W/Wall Motion / EF  Result Date: 11/10/2020  Lexiscan stress: Baseline EKG shows  relatively diffuse ST elevation. This is unchanged during infusion and into recovery. Overall electrically equivical due to baseline changes.  Myoview scan shows minimal thinning inferiorly. consistent with soft tissue attenuation (diaphragm) and probable normal perfusion No significant or scar  LVEF calculated at 59%  Low risk scan    DG Chest Port 1 View  Result Date: 11/09/2020 CLINICAL DATA:  Triage notes: Pt presents with constant chest pain, headache, since he woke up 2 hrs ago. Denies dizziness, weakness, nausea, vomiting, vision changes EXAM: PORTABLE CHEST 1 VIEW COMPARISON:  08/20/2020. FINDINGS: Cardiac silhouette is normal in size. No mediastinal or hilar masses. Mild linear opacity at the lung bases is consistent with atelectasis. Remainder of the lungs is clear. Hazy opacities noted on the prior chest radiograph have resolved. No convincing pleural effusion and no pneumothorax.  Skeletal structures are grossly intact. IMPRESSION: No acute cardiopulmonary disease. Electronically Signed   By: Lajean Manes M.D.   On: 11/09/2020 12:19   CT Angio Chest Aorta w/CM &/OR wo/CM  Result Date: 11/09/2020 CLINICAL DATA:  Chest pain. Elevated troponin. History of thoracic aortic aneurysm. EXAM: CT ANGIOGRAPHY CHEST WITH CONTRAST TECHNIQUE: Multidetector CT imaging of the chest was performed using the standard protocol during bolus administration of intravenous contrast. Multiplanar CT image reconstructions and MIPs were obtained to evaluate the vascular anatomy. CONTRAST:  26mL OMNIPAQUE IOHEXOL 350 MG/ML SOLN COMPARISON:  08/20/2020 FINDINGS: Cardiovascular: Dilated ascending thoracic aorta to 4.7 cm, unchanged from the prior exam. No aortic dissection. No significant atherosclerosis. Arch branch vessels are widely patent. Heart is normal in size and configuration. No coronary artery calcifications. No pericardial effusion. Pulmonary arteries are normal in caliber. Pulmonary arteries are well opacified. No evidence of a pulmonary embolism. Mediastinum/Nodes: Visualized thyroid is unremarkable. No neck base or axillary masses or enlarged lymph nodes. Subcentimeter mediastinal and hilar lymph nodes are noted, stable from the prior study. No masses or enlarged lymph nodes. Trachea and esophagus are unremarkable. Lungs/Pleura: Linear/reticular opacities in the dependent lower lobes consistent with atelectasis, improved compared to the prior exam. No convincing pneumonia and no pulmonary edema. No mass or suspicious nodule. No pleural effusion or pneumothorax. Upper Abdomen: No acute abnormality. Musculoskeletal: No acute fracture or acute finding. No bone lesion. Old right-sided rib fractures. Review of the MIP images confirms the above findings. IMPRESSION: 1. Stable, 4.7 cm ascending thoracic aortic aneurysm. No dissection. No atherosclerosis. Ascending thoracic aortic aneurysm. Recommend  semi-annual imaging followup by CTA or MRA and referral to cardiothoracic surgery if not already obtained. This recommendation follows 2010 ACCF/AHA/AATS/ACR/ASA/SCA/SCAI/SIR/STS/SVM Guidelines for the Diagnosis and Management of Patients With Thoracic Aortic Disease. Circulation. 2010; 121: S854-O270. Aortic aneurysm NOS (ICD10-I71.9) 2. No acute findings within the chest. 3. Mild dependent atelectasis in the lower lobes which has improved when compared to the prior CT. No evidence of pneumonia or pulmonary edema. Aortic aneurysm NOS (ICD10-I71.9). Electronically Signed   By: Lajean Manes M.D.   On: 11/09/2020 12:18   ECHOCARDIOGRAM COMPLETE  Result Date: 11/10/2020    ECHOCARDIOGRAM REPORT   Patient Name:   Paul Ray Date of Exam: 11/10/2020 Medical Rec #:  [id number]   Height:       68.0 in Accession #:    [id number]  Weight:       164.5 lb Date of Birth:  02-Mar-1956   BSA:          1.881 m Patient Age:    65 years    BP:  118/81 mmHg Patient Gender: M           HR:           76 bpm. Exam Location:  Inpatient Procedure: 2D Echo, Cardiac Doppler and Color Doppler Indications:    R07.9* Chest pain, unspecified  History:        Patient has prior history of Echocardiogram examinations, most                 recent 06/05/2020. Signs/Symptoms:Chest Pain. H/O Covid.                 Pericarditis like symptoms.  Sonographer:    Merrie Roof RDCS Referring Phys: [id number] [place]  1. Left ventricular ejection fraction, by estimation, is 60 to 65%. The left ventricle has normal function. The left ventricle has no regional wall motion abnormalities. There is mild left ventricular hypertrophy. Left ventricular diastolic parameters are consistent with Grade I diastolic dysfunction (impaired relaxation).  2. Right ventricular systolic function is normal. The right ventricular size is normal. Tricuspid regurgitation signal is inadequate for assessing PA pressure.  3. The mitral valve is normal  in structure. Trivial mitral valve regurgitation. No evidence of mitral stenosis.  4. The aortic valve is previously reported as bicuspid, and appears to have fusion of right and left coronary cusps. Aortic valve regurgitation is mild. No aortic stenosis is present.  5. Aortic dilatation noted. Aneurysm of the ascending aorta, measuring 47 mm.  6. Trivial pericardial effusion. FINDINGS  Left Ventricle: Left ventricular ejection fraction, by estimation, is 60 to 65%. The left ventricle has normal function. The left ventricle has no regional wall motion abnormalities. The left ventricular internal cavity size was normal in size. There is  mild left ventricular hypertrophy. Left ventricular diastolic parameters are consistent with Grade I diastolic dysfunction (impaired relaxation). Right Ventricle: The right ventricular size is normal. No increase in right ventricular wall thickness. Right ventricular systolic function is normal. Tricuspid regurgitation signal is inadequate for assessing PA pressure. Left Atrium: Left atrial size was normal in size. Right Atrium: Right atrial size was normal in size. Pericardium: Trivial pericardial effusion is present. Mitral Valve: The mitral valve is normal in structure. Trivial mitral valve regurgitation. No evidence of mitral valve stenosis. Tricuspid Valve: The tricuspid valve is normal in structure. Tricuspid valve regurgitation is not demonstrated. No evidence of tricuspid stenosis. Aortic Valve: The aortic valve is bicuspid. Aortic valve regurgitation is mild. No aortic stenosis is present. Aortic valve mean gradient measures 5.0 mmHg. Aortic valve peak gradient measures 8.2 mmHg. Aortic valve area, by VTI measures 3.85 cm. Pulmonic Valve: The pulmonic valve was normal in structure. Pulmonic valve regurgitation is trivial. No evidence of pulmonic stenosis. Aorta: The aortic root is normal in size and structure and aortic dilatation noted. There is an aneurysm involving the  ascending aorta measuring 47 mm. Venous: The inferior vena cava was not well visualized. IAS/Shunts: No atrial level shunt detected by color flow Doppler.  LEFT VENTRICLE PLAX 2D LVIDd:         4.10 cm  Diastology LVIDs:         2.40 cm  LV e' medial:    8.38 cm/s LV PW:         1.00 cm  LV E/e' medial:  11.9 LV IVS:        1.20 cm  LV e' lateral:   11.20 cm/s LVOT diam:     2.10 cm  LV E/e' lateral:  8.9 LV SV:         111 LV SV Index:   59 LVOT Area:     3.46 cm  RIGHT VENTRICLE RV Basal diam:  4.00 cm LEFT ATRIUM             Index       RIGHT ATRIUM           Index LA diam:        3.60 cm 1.91 cm/m  RA Area:     24.70 cm LA Vol (A2C):   98.3 ml 52.27 ml/m RA Volume:   76.00 ml  40.41 ml/m LA Vol (A4C):   46.0 ml 24.46 ml/m LA Biplane Vol: 70.6 ml 37.54 ml/m  AORTIC VALVE AV Area (Vmax):    3.78 cm AV Area (Vmean):   3.67 cm AV Area (VTI):     3.85 cm AV Vmax:           143.00 cm/s AV Vmean:          101.000 cm/s AV VTI:            0.289 m AV Peak Grad:      8.2 mmHg AV Mean Grad:      5.0 mmHg LVOT Vmax:         156.00 cm/s LVOT Vmean:        107.000 cm/s LVOT VTI:          0.321 m LVOT/AV VTI ratio: 1.11  AORTA Ao Root diam: 3.40 cm Ao Asc diam:  4.70 cm MITRAL VALVE MV Area (PHT): 4.63 cm     SHUNTS MV Decel Time: 164 msec     Systemic VTI:  0.32 m MV E velocity: 99.40 cm/s   Systemic Diam: 2.10 cm MV A velocity: 111.00 cm/s MV E/A ratio:  0.90 Cherlynn Kaiser MD Electronically signed by Cherlynn Kaiser MD Signature Date/Time: 11/10/2020/2:14:02 PM    Final     Cardiac Studies   Echo showed preserved EF and trivial pericardial effusion.  Nuc showed no ischemia or infarction.  Patient Profile     65 y.o. male with a hx of thoracic aortic aneurysm, cervical stenosis with chronic right shoulder pain, bipolar disorder, and ADD who is seen today for the evaluation of chest pain.  Assessment & Plan    Chest pain - hs troponin 173 --> 155 --> 104 --> 113 --> 111 --> 158 -->51 - hx of coronary  calcium score of zero 05/2020 - EKG with diffuse ST elevations inferior and lateral leads - CTA negative for PE - CRP 13.7 > 9.5 - suspect pericarditis  - Sed rate WNL x 2 - pleuritic chest pain described  - colchicine x 3 mo  and ibuprofen x 1 mo started with PPI   Ascending thoracic aortic aneurysm - stable at 4.7 cm on CTA   Hyperlipidemia with LDL goal < 70 (AAA) 11/10/2020: Cholesterol 97; HDL 52; LDL Cholesterol 37; Triglycerides 40; VLDL 8 Continue 10 mg lipitor - dose reduced due to use of colchicine - d/w pharmacy   Hx of right thoracotomy - ?pleurodesis for recurrent pleural effusion 10 years ago  Discussed with patient, stable for hospital discharge today.  Consider cardiac MRI as outpatient at follow up.  No narcotic pain medications to be prescribed home going given no cardiovascular indication. Pericardial pain to be treated with colchicine and ibuprofen.   For questions or updates, please contact [place] Please consult [email or web address] for contact info under  Signed, Elouise Munroe, MD  11/11/2020, 8:43 AM

## 2020-11-12 ENCOUNTER — Telehealth: Payer: Self-pay | Admitting: Orthopaedic Surgery

## 2020-11-12 NOTE — Telephone Encounter (Signed)
Please call pt back and inform thank you

## 2020-11-12 NOTE — Telephone Encounter (Signed)
Please advise 

## 2020-11-12 NOTE — Telephone Encounter (Signed)
Patient called requesting a call back from [facility]. Patient states he was in [facility] for chest pains and wants Renato Gails. Or Dr. Romona Curls team to answer his question if it's ok for him to still get injection. Please call patient [phone].

## 2020-11-14 ENCOUNTER — Telehealth: Payer: Self-pay | Admitting: Physical Medicine and Rehabilitation

## 2020-11-14 NOTE — Telephone Encounter (Signed)
Patient called asked for a call back concerning the message he left yesterday concerning him getting out of the hospital. Please see previous message. The number to contact patient is [phone]

## 2020-11-14 NOTE — Telephone Encounter (Signed)
Called pt and spoken with him.

## 2020-11-15 ENCOUNTER — Ambulatory Visit (INDEPENDENT_AMBULATORY_CARE_PROVIDER_SITE_OTHER): Payer: PPO | Admitting: Physical Medicine and Rehabilitation

## 2020-11-15 ENCOUNTER — Encounter: Payer: Self-pay | Admitting: Physical Medicine and Rehabilitation

## 2020-11-15 ENCOUNTER — Ambulatory Visit: Payer: Self-pay

## 2020-11-15 ENCOUNTER — Other Ambulatory Visit: Payer: Self-pay

## 2020-11-15 VITALS — BP 149/81 | HR 68

## 2020-11-15 DIAGNOSIS — M5412 Radiculopathy, cervical region: Secondary | ICD-10-CM | POA: Diagnosis not present

## 2020-11-15 MED ORDER — BETAMETHASONE SOD PHOS & ACET 6 (3-3) MG/ML IJ SUSP
12.0000 mg | Freq: Once | INTRAMUSCULAR | Status: AC
  Administered 2020-11-15: 12 mg

## 2020-11-15 NOTE — Progress Notes (Signed)
Pt state neck pain that travels up to his head and dow his right shoulder. Pt state he didn't know what cause the pain but its content. Pt state he take over the counter pain meds to help ease his pain.  Numeric Pain Rating Scale and Functional Assessment Average Pain 10   In the last MONTH (on 0-10 scale) has pain interfered with the following?  1. General activity like being  able to carry out your everyday physical activities such as walking, climbing stairs, carrying groceries, or moving a chair?  Rating(10)   +Driver, -BT, -Dye Allergies.

## 2020-11-15 NOTE — Patient Instructions (Signed)

## 2020-12-04 ENCOUNTER — Encounter: Payer: Self-pay | Admitting: Orthopaedic Surgery

## 2020-12-04 ENCOUNTER — Ambulatory Visit (INDEPENDENT_AMBULATORY_CARE_PROVIDER_SITE_OTHER): Payer: PPO | Admitting: Orthopaedic Surgery

## 2020-12-04 ENCOUNTER — Ambulatory Visit (INDEPENDENT_AMBULATORY_CARE_PROVIDER_SITE_OTHER): Payer: PPO

## 2020-12-04 DIAGNOSIS — M25521 Pain in right elbow: Secondary | ICD-10-CM

## 2020-12-04 DIAGNOSIS — M25561 Pain in right knee: Secondary | ICD-10-CM

## 2020-12-04 DIAGNOSIS — S8001XA Contusion of right knee, initial encounter: Secondary | ICD-10-CM

## 2020-12-04 DIAGNOSIS — M7021 Olecranon bursitis, right elbow: Secondary | ICD-10-CM

## 2020-12-04 NOTE — Progress Notes (Signed)
Office Visit Note   Patient: Paul Ray           Date of Birth: October 27, 1955           MRN: [id number] Visit Date: 12/04/2020              Requested by: Shirline Frees, MD [place],  [place] [address] PCP: Shirline Frees, MD   Assessment & Plan: Visit Diagnoses:  1. Right knee pain, unspecified chronicity   2. Pain in right elbow   3. Olecranon bursitis, right elbow   4. Contusion of right knee, initial encounter     Plan: I was able to aspirate about 5 cc of fluid that was clear and yellow from the right elbow consistent with olecranon bursitis.  I have advocated anti-inflammatories as needed as well as ice.  If the fluid recurs we can always see him back to repeat draining the olecranon area.  As far as the right knee goes, there is really no treatment needed other than time.  All questions and concerns were answered and addressed.  Follow-Up Instructions: Return if symptoms worsen or fail to improve.   Orders:  Orders Placed This Encounter  Procedures  . XR Elbow Complete Right (3+View)  . XR Knee 1-2 Views Right   No orders of the defined types were placed in this encounter.     Procedures: No procedures performed   Clinical Data: No additional findings.   Subjective: Chief Complaint  Patient presents with  . Right Knee - Pain  . Right Elbow - Pain  Patient comes in today with right elbow and right knee pain.  He fell about a week ago when he was jumping across a [employer] and he landed on his right knee and his right elbow.  He says he did not really experience much elbow pain until about 2 days ago and he noticed swelling pain and redness at the right elbow.  He is also had some right lateral knee pain he points to the fibular head as source of his pain.  He denies any instability.  He was hospitalized less than a month ago with chest pain.  Recently he has had an epidural steroid injection in cervical spine.  HPI  Review of  Systems He denies any fever, chills, nausea, vomiting  Objective: Vital Signs: There were no vitals taken for this visit.  Physical Exam He is alert and oriented and in no acute distress Ortho Exam Examination of his right elbow does show some olecranon fullness with fluid consistent with olecranon bursitis.  There is only slight redness but full range of motion and no drainage.  Examination of his right knee shows is ligamentously stable with full range of motion.  There is some mild pain over the fibular head but no instability on exam. Specialty Comments:  No specialty comments available.  Imaging: XR Elbow Complete Right (3+View)  Result Date: 12/04/2020 X-rays of the right elbow show no acute findings.  There is no evidence of fracture.  There is minimal soft tissue swelling at the olecranon area.  XR Knee 1-2 Views Right  Result Date: 12/04/2020 2 views of the right knee showed no gross abnormalities or alignment.  There may be a slight cortical irregularity at the fibular head.  Correlate clinically.    PMFS History: Patient Active Problem List   Diagnosis Date Noted  . Chest pain 11/09/2020  . Other spondylosis with radiculopathy, cervical region 10/02/2020  .  Ascending aortic aneurysm ([place])   . Benign essential HTN   . Coronary artery calcification seen on CAT scan   . Psychoactive substance-induced psychosis ([place])   . Rib pain 07/01/2017  . Closed fracture of one rib of left side 07/01/2017  . Chronic right shoulder pain 05/04/2017  . Osteoarthritis of right hip 12/02/2016  . Impingement syndrome of right shoulder 07/05/2013  . Empyema lung ([place])   . Abscess of lung(513.0)   . Bipolar depression ([place])   . ADD (attention deficit disorder)   . Hepatitis C   . Hip pain 05/02/2011  . History of total hip arthroplasty 04/23/2011  . Lung mass 10/22/2010  . Pleural effusion 10/15/2010   Past Medical History:  Diagnosis Date  . Abscess of lung(513.0)    Right  lower lobe  . ADD (attention deficit disorder)   . Anxiety   . Arthritis   . Ascending aortic aneurysm ([place])    70mm by Chest CTA 05/2020 and 108mm by echo 05/2020  . Benign essential HTN   . Bipolar depression ([place])   . Coronary artery calcification seen on CAT scan   . Empyema lung ([place])   . Hemorrhoids   . Hepatitis C   . Pneumonia    last year  . Prostate abscess     Family History  Problem Relation Age of Onset  . COPD Mother   . Heart disease Mother   . Hypertension Mother   . Coronary artery disease Father   . Dementia Father   . Heart failure Father   . Prostate cancer Paternal Grandfather        also had bone cancer  . Breast cancer Maternal Grandmother   . Cancer Paternal Grandmother        unsure what kind    Past Surgical History:  Procedure Laterality Date  . ELBOW SURGERY  2005   Right  . FOOT NEUROMA SURGERY  2006   Left  . HIP RESURFACING     [facility] hip [facility] 01/2011  . LUMBAR LAMINECTOMY/DECOMPRESSION MICRODISCECTOMY  07/24/2011   Procedure: LUMBAR LAMINECTOMY/DECOMPRESSION MICRODISCECTOMY;  Surgeon: Eustace Moore;  Location: [place] [facility];  Service: Neurosurgery;  Laterality: Bilateral;  Bilateral  , Lumbar Three-Four, Lumbar Four-Five Decompressive Laminectomy Rm # [id number]  . LUNG SURGERY     for empyema  . SHOULDER ARTHROSCOPY Right 07/05/2013   Procedure: RIGHT SHOULDER ARTHROSCOPY WITH DEBRIDEMENT;  Surgeon: Mcarthur Rossetti, MD;  Location: [place];  Service: Orthopedics;  Laterality: Right;  . TENDON TRANSFER Left 01/26/2014   Procedure: LEFT THUMB TRAPEZIECTOMY KNOTTED TENDON INTRAPOSITION TRANSFER OF ABDUCTOR POLLICIS LONGUS TO [place];  Surgeon: Cammie Sickle, MD;  Location: [place];  Service: Orthopedics;  Laterality: Left;  . THORACOTOMY  November 14 2010   rt   Social History   Occupational History  . Occupation: Games developer  Tobacco Use  . Smoking status: Never Smoker  . Smokeless tobacco: Never Used  Vaping Use  .  Vaping Use: Never used  Substance and Sexual Activity  . Alcohol use: No    Comment: quit drinking in 1989  . Drug use: No  . Sexual activity: Not on file

## 2020-12-06 DIAGNOSIS — Z20822 Contact with and (suspected) exposure to covid-19: Secondary | ICD-10-CM | POA: Diagnosis not present

## 2020-12-06 DIAGNOSIS — Z9181 History of falling: Secondary | ICD-10-CM | POA: Diagnosis not present

## 2020-12-06 DIAGNOSIS — S51001A Unspecified open wound of right elbow, initial encounter: Secondary | ICD-10-CM | POA: Diagnosis not present

## 2020-12-06 DIAGNOSIS — Z043 Encounter for examination and observation following other accident: Secondary | ICD-10-CM | POA: Diagnosis not present

## 2020-12-06 DIAGNOSIS — F250 Schizoaffective disorder, bipolar type: Secondary | ICD-10-CM | POA: Diagnosis not present

## 2020-12-06 DIAGNOSIS — W1839XA Other fall on same level, initial encounter: Secondary | ICD-10-CM | POA: Diagnosis not present

## 2020-12-06 DIAGNOSIS — Z811 Family history of alcohol abuse and dependence: Secondary | ICD-10-CM | POA: Diagnosis not present

## 2020-12-06 DIAGNOSIS — S5001XA Contusion of right elbow, initial encounter: Secondary | ICD-10-CM | POA: Diagnosis not present

## 2020-12-06 DIAGNOSIS — Y999 Unspecified external cause status: Secondary | ICD-10-CM | POA: Diagnosis not present

## 2020-12-06 DIAGNOSIS — Z813 Family history of other psychoactive substance abuse and dependence: Secondary | ICD-10-CM | POA: Diagnosis not present

## 2020-12-06 DIAGNOSIS — F10239 Alcohol dependence with withdrawal, unspecified: Secondary | ICD-10-CM | POA: Diagnosis not present

## 2020-12-06 DIAGNOSIS — F1020 Alcohol dependence, uncomplicated: Secondary | ICD-10-CM | POA: Diagnosis not present

## 2020-12-06 DIAGNOSIS — Z79899 Other long term (current) drug therapy: Secondary | ICD-10-CM | POA: Diagnosis not present

## 2020-12-06 DIAGNOSIS — F319 Bipolar disorder, unspecified: Secondary | ICD-10-CM | POA: Diagnosis not present

## 2020-12-06 DIAGNOSIS — G894 Chronic pain syndrome: Secondary | ICD-10-CM | POA: Diagnosis not present

## 2020-12-06 DIAGNOSIS — M19071 Primary osteoarthritis, right ankle and foot: Secondary | ICD-10-CM | POA: Diagnosis not present

## 2020-12-06 DIAGNOSIS — G4700 Insomnia, unspecified: Secondary | ICD-10-CM | POA: Diagnosis not present

## 2020-12-06 DIAGNOSIS — F419 Anxiety disorder, unspecified: Secondary | ICD-10-CM | POA: Diagnosis not present

## 2020-12-06 DIAGNOSIS — F10229 Alcohol dependence with intoxication, unspecified: Secondary | ICD-10-CM | POA: Diagnosis not present

## 2020-12-10 NOTE — Progress Notes (Signed)
Paul Ray - 65 y.o. male MRN [id number]  Date of birth: 1956/05/01  Office Visit Note: Visit Date: 11/15/2020 PCP: Shirline Frees, MD Referred by: Shirline Frees, MD  Subjective: Chief Complaint  Patient presents with  . Head - Pain  . Neck - Pain  . Right Shoulder - Pain   HPI:  Paul Ray is a 65 y.o. male who comes in today at the request of Dr. Jean Rosenthal for planned Right C7-T1 Cervical Interlaminar epidural steroid injection with fluoroscopic guidance.  The patient has failed conservative care including home exercise, medications, time and activity modification.  This injection will be diagnostic and hopefully therapeutic.  Please see requesting physician notes for further details and justification. MRI reviewed with images and spine model.  MRI reviewed in the note below.  I have seen the patient in the remote past for lumbar injection.  I do feel like some of the referral pattern he is having is more consistent with myofascial pain syndrome and trigger points.  He gets referral pattern up the neck into the occiput.  Not really frank occipital neuralgia but it could be a start of that.  He will follow-up with Dr. Leana Gamer at some point for his spine.   ROS Otherwise per HPI.  Assessment & Plan: Visit Diagnoses:    ICD-10-CM   1. Cervical radiculopathy  M54.12 XR C-ARM NO REPORT    Epidural Steroid injection    betamethasone acetate-betamethasone sodium phosphate (CELESTONE) injection 12 mg    Plan: No additional findings.   Meds & Orders:  Meds ordered this encounter  Medications  . betamethasone acetate-betamethasone sodium phosphate (CELESTONE) injection 12 mg    Orders Placed This Encounter  Procedures  . XR C-ARM NO REPORT  . Epidural Steroid injection    Follow-up: Return for visit to requesting physician as needed.   Procedures: No procedures performed  Cervical Epidural Steroid Injection - Interlaminar Approach with Fluoroscopic  Guidance  Patient: Paul Ray      Date of Birth: [birth year] MRN: [id number] PCP: Shirline Frees, MD      Visit Date: 11/15/2020   Universal Protocol:    Date/Time: 12/11/2210:50 PM  Consent Given By: the patient  Position: PRONE  Additional Comments: Vital signs were monitored before and after the procedure. Patient was prepped and draped in the usual sterile fashion. The correct patient, procedure, and site was verified.   Injection Procedure Details:   Procedure diagnoses: Cervical radiculopathy [M54.12]    Meds Administered:  Meds ordered this encounter  Medications  . betamethasone acetate-betamethasone sodium phosphate (CELESTONE) injection 12 mg     Laterality: Right  Location/Site: C7-T1  Needle: 3.5 in., 20 ga. Tuohy  Needle Placement: Paramedian epidural space  Findings:  -Comments: Excellent flow of contrast into the epidural space.  Procedure Details: Using a paramedian approach from the side mentioned above, the region overlying the inferior lamina was localized under fluoroscopic visualization and the soft tissues overlying this structure were infiltrated with 4 ml. of 1% Lidocaine without Epinephrine. A # 20 gauge, Tuohy needle was inserted into the epidural space using a paramedian approach.  The epidural space was localized using loss of resistance along with contralateral oblique bi-planar fluoroscopic views.  After negative aspirate for air, blood, and CSF, a 2 ml. volume of Isovue-250 was injected into the epidural space and the flow of contrast was observed. Radiographs were obtained for documentation purposes.   The injectate was administered into the level noted above.  Additional Comments:  The patient tolerated the procedure well Dressing: 2 x 2 sterile gauze and Band-Aid    Post-procedure details: Patient was observed during the procedure. Post-procedure instructions were reviewed.  Patient left the clinic in stable condition.      Clinical History: MRI CERVICAL SPINE WITHOUT CONTRAST  TECHNIQUE: Multiplanar, multisequence MR imaging of the cervical spine was performed. No intravenous contrast was administered.  COMPARISON:  None.  FINDINGS: Alignment: Normal  Vertebrae: Vertebral body heights are maintained. No specific evidence of acute fracture, discitis/osteomyelitis, or suspicious bone lesion.  Cord: Normal cord signal  Posterior Fossa, vertebral arteries, paraspinal tissues: Remote appearing bilateral cerebellar lacunar infarcts, partially imaged. Bilateral vertebral artery flow voids are maintained.  Disc levels:  Motion limited evaluation.  C2-C3: Bilateral facet and uncovertebral hypertrophy. Mild bilateral foraminal stenosis without significant canal stenosis.  C3-C4: Posterior disc osteophyte complex and bilateral facet and uncovertebral hypertrophy with moderate to severe left greater than right foraminal stenosis. Mild canal stenosis.  C4-C5: Right eccentric posterior disc osteophyte complex with right greater than left facet and uncovertebral hypertrophy. Resulting moderate to severe right foraminal stenosis with narrowing of the right root entry zone. Mild left foraminal stenosis. Mild canal stenosis.  C5-C6: Posterior disc osteophyte complex with bilateral facet and uncovertebral hypertrophy. Moderate right and mild left foraminal stenosis. Mild canal stenosis.  C6-C7: Small posterior disc osteophyte complex and bilateral facet and uncovertebral hypertrophy. Severe bilateral foraminal stenosis and mild to moderate canal stenosis.  C7-T1: No significant disc protrusion, foraminal stenosis, or canal stenosis.  IMPRESSION: 1. Severe bilateral foraminal stenosis at C6-C7. Moderate to severe foraminal stenosis bilaterally at C3-C4 and on the right at C4-C5. Moderate foraminal stenosis on the right at C5-C6. Multilevel mild foraminal stenosis is detailed  above. 2. Mild to moderate canal stenosis at C6-C7 and multilevel mild canal stenosis.   Electronically Signed   By: Margaretha Sheffield MD   On: 10/09/2020 08:51     Objective:  VS:  HT:    WT:   BMI:     BP:(!) 149/81  HR:68bpm  TEMP: ( )  RESP:  Physical Exam Vitals and nursing note reviewed.  Constitutional:      General: He is not in acute distress.    Appearance: Normal appearance. He is not ill-appearing.  HENT:     Head: Normocephalic and atraumatic.     Right Ear: External ear normal.     Left Ear: External ear normal.  Eyes:     Extraocular Movements: Extraocular movements intact.  Cardiovascular:     Rate and Rhythm: Normal rate.     Pulses: Normal pulses.  Abdominal:     General: There is no distension.     Palpations: Abdomen is soft.  Musculoskeletal:        General: No signs of injury.     Cervical back: Neck supple. Tenderness present. No rigidity.     Right lower leg: No edema.     Left lower leg: No edema.     Comments: Patient has good strength in the upper extremities with 5 out of 5 strength in wrist extension long finger flexion APB.  No intrinsic hand muscle atrophy.  Negative Hoffmann's test.  Lymphadenopathy:     Cervical: No cervical adenopathy.  Skin:    Findings: No erythema or rash.  Neurological:     General: No focal deficit present.     Mental Status: He is alert and oriented to person, place, and time.  Sensory: No sensory deficit.     Motor: No weakness or abnormal muscle tone.     Coordination: Coordination normal.  Psychiatric:        Mood and Affect: Mood normal.        Behavior: Behavior normal.      Imaging: No results found.

## 2020-12-10 NOTE — Procedures (Signed)
Cervical Epidural Steroid Injection - Interlaminar Approach with Fluoroscopic Guidance  Patient: Paul Ray      Date of Birth: 09-10-1955 MRN: [id number] PCP: Shirline Frees, MD      Visit Date: 11/15/2020   Universal Protocol:    Date/Time: 12/11/2210:50 PM  Consent Given By: the patient  Position: PRONE  Additional Comments: Vital signs were monitored before and after the procedure. Patient was prepped and draped in the usual sterile fashion. The correct patient, procedure, and site was verified.   Injection Procedure Details:   Procedure diagnoses: Cervical radiculopathy [M54.12]    Meds Administered:  Meds ordered this encounter  Medications  . betamethasone acetate-betamethasone sodium phosphate (CELESTONE) injection 12 mg     Laterality: Right  Location/Site: C7-T1  Needle: 3.5 in., 20 ga. Tuohy  Needle Placement: Paramedian epidural space  Findings:  -Comments: Excellent flow of contrast into the epidural space.  Procedure Details: Using a paramedian approach from the side mentioned above, the region overlying the inferior lamina was localized under fluoroscopic visualization and the soft tissues overlying this structure were infiltrated with 4 ml. of 1% Lidocaine without Epinephrine. A # 20 gauge, Tuohy needle was inserted into the epidural space using a paramedian approach.  The epidural space was localized using loss of resistance along with contralateral oblique bi-planar fluoroscopic views.  After negative aspirate for air, blood, and CSF, a 2 ml. volume of Isovue-250 was injected into the epidural space and the flow of contrast was observed. Radiographs were obtained for documentation purposes.   The injectate was administered into the level noted above.  Additional Comments:  The patient tolerated the procedure well Dressing: 2 x 2 sterile gauze and Band-Aid    Post-procedure details: Patient was observed during the procedure. Post-procedure  instructions were reviewed.  Patient left the clinic in stable condition.

## 2020-12-11 ENCOUNTER — Telehealth: Payer: Self-pay

## 2020-12-11 NOTE — Telephone Encounter (Signed)
Please advise 

## 2020-12-11 NOTE — Telephone Encounter (Signed)
Pt called requesting to be worked in. I told pt of next weeks apt. He stated he would call Dr. Ninfa Linden on his personal cell

## 2020-12-12 ENCOUNTER — Ambulatory Visit: Payer: PPO | Admitting: Orthopaedic Surgery

## 2020-12-12 ENCOUNTER — Ambulatory Visit (INDEPENDENT_AMBULATORY_CARE_PROVIDER_SITE_OTHER): Payer: PPO

## 2020-12-12 DIAGNOSIS — M79672 Pain in left foot: Secondary | ICD-10-CM

## 2020-12-12 MED ORDER — METHYLPREDNISOLONE 4 MG PO TABS: ORAL_TABLET | ORAL | 0 refills | Status: DC

## 2020-12-12 NOTE — Progress Notes (Signed)
Office Visit Note   Patient: Paul Ray           Date of Birth: [birth year]           MRN: [id number] Visit Date: 12/12/2020              Requested by: Shirline Frees, MD [place],   [id number] PCP: Shirline Frees, MD   Assessment & Plan: Visit Diagnoses:  1. Pain in left foot     Plan: Hopefully the pain with numbness and tingling in his left foot will resolve quickly since it came on just quickly.  I did drain his right elbow olecranon area and got about 3 cc of fluid off of this.  There is no evidence of infection.  I will leave him on a 6-day steroid taper to see if this helps with the numbness and tingling in his pain in general.  All questions and concerns were answered and addressed.  Follow-up can be as needed.  Follow-Up Instructions: Return if symptoms worsen or fail to improve.   Orders:  Orders Placed This Encounter  Procedures  . XR Foot Complete Left   Meds ordered this encounter  Medications  . methylPREDNISolone (MEDROL) 4 MG tablet    Sig: Medrol dose pack. Take as instructed    Dispense:  21 tablet    Refill:  0      Procedures: No procedures performed   Clinical Data: No additional findings.   Subjective: Chief Complaint  Patient presents with  . Left Foot - Pain  The patient comes in today with acute left foot pain and numbness and tingling in the second toe that started this past Monday which was just a few days ago.  Also saw him last week for right elbow olecranon bursitis.  He said that the elbow does need to be drained again.  He has had no other acute changes in medical status.  HPI  Review of Systems Today he denies any fever, chills, nausea, vomiting  Objective: Vital Signs: There were no vitals taken for this visit.  Physical Exam He is alert and orient x3 and in no acute distress Ortho Exam Examination of his right elbow does show a small fluid collection over the olecranon area.  There is no  drainage and no redness at all.  It is tender to the touch.  His elbow exam is otherwise normal.  Examination of his left foot does show some subjective numbness of the second ray.  There is no gross deformities and his foot is well-perfused.  His motor exam is intact. Specialty Comments:  No specialty comments available.  Imaging: XR Foot Complete Left  Result Date: 12/12/2020 3 views the left foot shows first MTP joint arthritis but otherwise no acute findings.    PMFS History: Patient Active Problem List   Diagnosis Date Noted  . Chest pain 11/09/2020  . Other spondylosis with radiculopathy, cervical region 10/02/2020  . Ascending aortic aneurysm ([place])   . Benign essential HTN   . Coronary artery calcification seen on CAT scan   . Psychoactive substance-induced psychosis ([place])   . Rib pain 07/01/2017  . Closed fracture of one rib of left side 07/01/2017  . Chronic right shoulder pain 05/04/2017  . Osteoarthritis of right hip 12/02/2016  . Impingement syndrome of right shoulder 07/05/2013  . Empyema lung ([place])   . Abscess of lung(513.0)   . Bipolar depression ([place])   . ADD (attention  deficit disorder)   . Hepatitis C   . Hip pain 05/02/2011  . History of total hip arthroplasty 04/23/2011  . Lung mass 10/22/2010  . Pleural effusion 10/15/2010   Past Medical History:  Diagnosis Date  . Abscess of lung(513.0)    Right lower lobe  . ADD (attention deficit disorder)   . Anxiety   . Arthritis   . Ascending aortic aneurysm ([place])    71mm by Chest CTA 05/2020 and 42mm by echo 05/2020  . Benign essential HTN   . Bipolar depression ([place])   . Coronary artery calcification seen on CAT scan   . Empyema lung ([place])   . Hemorrhoids   . Hepatitis C   . Pneumonia    last year  . Prostate abscess     Family History  Problem Relation Age of Onset  . COPD Mother   . Heart disease Mother   . Hypertension Mother   . Coronary artery disease Father   . Dementia Father   . Heart  failure Father   . Prostate cancer Paternal Grandfather        also had bone cancer  . Breast cancer Maternal Grandmother   . Cancer Paternal Grandmother        unsure what kind    Past Surgical History:  Procedure Laterality Date  . ELBOW SURGERY  2005   Right  . FOOT NEUROMA SURGERY  2006   Left  . HIP RESURFACING     [facility] hip [facility] 01/2011  . LUMBAR LAMINECTOMY/DECOMPRESSION MICRODISCECTOMY  07/24/2011   Procedure: LUMBAR LAMINECTOMY/DECOMPRESSION MICRODISCECTOMY;  Surgeon: Eustace Moore;  Location: [facility];  Service: Neurosurgery;  Laterality: Bilateral;  Bilateral  , Lumbar Three-Four, Lumbar Four-Five Decompressive Laminectomy Rm # [id number]  . LUNG SURGERY     for empyema  . SHOULDER ARTHROSCOPY Right 07/05/2013   Procedure: RIGHT SHOULDER ARTHROSCOPY WITH DEBRIDEMENT;  Surgeon: Mcarthur Rossetti, MD;  Location: [facility];  Service: Orthopedics;  Laterality: Right;  . TENDON TRANSFER Left 01/26/2014   Procedure: LEFT THUMB TRAPEZIECTOMY KNOTTED TENDON INTRAPOSITION TRANSFER OF ABDUCTOR POLLICIS LONGUS TO [place];  Surgeon: Cammie Sickle, MD;  Location: [facility];  Service: Orthopedics;  Laterality: Left;  . THORACOTOMY  November 14 2010   rt   Social History   Occupational History  . Occupation: Games developer  Tobacco Use  . Smoking status: Never Smoker  . Smokeless tobacco: Never Used  Vaping Use  . Vaping Use: Never used  Substance and Sexual Activity  . Alcohol use: No    Comment: quit drinking in 1989  . Drug use: No  . Sexual activity: Not on file

## 2020-12-13 ENCOUNTER — Ambulatory Visit: Payer: PPO | Admitting: Family Medicine

## 2020-12-19 ENCOUNTER — Encounter: Payer: PPO | Admitting: Cardiology

## 2020-12-19 NOTE — Progress Notes (Signed)
This encounter was created in error - please disregard.

## 2020-12-20 ENCOUNTER — Ambulatory Visit (INDEPENDENT_AMBULATORY_CARE_PROVIDER_SITE_OTHER): Payer: PPO | Admitting: Cardiology

## 2020-12-20 ENCOUNTER — Encounter: Payer: Self-pay | Admitting: Orthopaedic Surgery

## 2020-12-20 ENCOUNTER — Other Ambulatory Visit: Payer: Self-pay

## 2020-12-20 ENCOUNTER — Ambulatory Visit: Payer: PPO | Admitting: Orthopaedic Surgery

## 2020-12-20 ENCOUNTER — Encounter: Payer: Self-pay | Admitting: Cardiology

## 2020-12-20 VITALS — BP 154/78 | HR 70 | Ht 68.0 in | Wt 159.6 lb

## 2020-12-20 DIAGNOSIS — I2510 Atherosclerotic heart disease of native coronary artery without angina pectoris: Secondary | ICD-10-CM | POA: Diagnosis not present

## 2020-12-20 DIAGNOSIS — E7800 Pure hypercholesterolemia, unspecified: Secondary | ICD-10-CM

## 2020-12-20 DIAGNOSIS — I712 Thoracic aortic aneurysm, without rupture: Secondary | ICD-10-CM

## 2020-12-20 DIAGNOSIS — I301 Infective pericarditis: Secondary | ICD-10-CM

## 2020-12-20 DIAGNOSIS — M7021 Olecranon bursitis, right elbow: Secondary | ICD-10-CM

## 2020-12-20 DIAGNOSIS — I7121 Aneurysm of the ascending aorta, without rupture: Secondary | ICD-10-CM

## 2020-12-20 DIAGNOSIS — I10 Essential (primary) hypertension: Secondary | ICD-10-CM | POA: Diagnosis not present

## 2020-12-20 DIAGNOSIS — B3323 Viral pericarditis: Secondary | ICD-10-CM | POA: Insufficient documentation

## 2020-12-20 MED ORDER — ATORVASTATIN CALCIUM 20 MG PO TABS: 20.0000 mg | ORAL_TABLET | Freq: Every day | ORAL | 3 refills | Status: DC

## 2020-12-20 MED ORDER — COLCHICINE 0.6 MG PO TABS: 0.6000 mg | ORAL_TABLET | Freq: Every day | ORAL | 0 refills | Status: DC

## 2020-12-20 MED ORDER — DOXYCYCLINE HYCLATE 100 MG PO TABS: 100.0000 mg | ORAL_TABLET | Freq: Two times a day (BID) | ORAL | 0 refills | Status: DC

## 2020-12-20 MED ORDER — METOPROLOL SUCCINATE ER 25 MG PO TB24: 25.0000 mg | ORAL_TABLET | Freq: Every day | ORAL | 3 refills | Status: DC

## 2020-12-20 MED ORDER — PANTOPRAZOLE SODIUM 40 MG PO TBEC: 40.0000 mg | DELAYED_RELEASE_TABLET | Freq: Every day | ORAL | 0 refills | Status: DC

## 2020-12-20 NOTE — Patient Instructions (Signed)
Medication Instructions:  Your physician has recommended you make the following change in your medication:  1) DECREASE colchicine to 0.6 mg daily for the next two months, then discontinue  2) CONTINUE Protonix for 2 more months, then discontinue 2) START taking Toprol XL (metoprolol succinate) 25 mg daily  *If you need a refill on your cardiac medications before your next appointment, please call your pharmacy*  Follow-Up: At [facility], you and your health needs are our priority.  As part of our continuing mission to provide you with exceptional heart care, we have created designated Provider Care Teams.  These Care Teams include your primary Cardiologist (physician) and Advanced Practice Providers (APPs -  Physician Assistants and Nurse Practitioners) who all work together to provide you with the care you need, when you need it.  Your next appointment:   1 year(s)  The format for your next appointment:   In Person  Provider:   You may see Fransico Him, MD or one of the following Advanced Practice Providers on your designated Care Team:    Melina Copa, PA-C  Ermalinda Barrios, PA-C   You have been referred to see our Pharmacist in the Hypertension Clinic  Other Instructions  Low-Sodium Eating Plan Sodium, which is an element that makes up salt, helps you maintain a healthy balance of fluids in your body. Too much sodium can increase your blood pressure and cause fluid and waste to be held in your body. Your health care provider or dietitian may recommend following this plan if you have high blood pressure (hypertension), kidney disease, liver disease, or heart failure. Eating less sodium can help lower your blood pressure, reduce swelling, and protect your heart, liver, and kidneys. What are tips for following this plan? Reading food labels  The Nutrition Facts label lists the amount of sodium in one serving of the food. If you eat more than one serving, you must multiply the  listed amount of sodium by the number of servings.  Choose foods with less than 140 mg of sodium per serving.  Avoid foods with 300 mg of sodium or more per serving. Shopping  Look for lower-sodium products, often labeled as "low-sodium" or "no salt added."  Always check the sodium content, even if foods are labeled as "unsalted" or "no salt added."  Buy fresh foods. ? Avoid canned foods and pre-made or frozen meals. ? Avoid canned, cured, or processed meats.  Buy breads that have less than 80 mg of sodium per slice.   Cooking  Eat more home-cooked food and less restaurant, buffet, and fast food.  Avoid adding salt when cooking. Use salt-free seasonings or herbs instead of table salt or sea salt. Check with your health care provider or pharmacist before using salt substitutes.  Cook with plant-based oils, such as canola, sunflower, or olive oil.   Meal planning  When eating at a restaurant, ask that your food be prepared with less salt or no salt, if possible. Avoid dishes labeled as brined, pickled, cured, smoked, or made with soy sauce, miso, or teriyaki sauce.  Avoid foods that contain MSG (monosodium glutamate). MSG is sometimes added to [place] food, bouillon, and some canned foods.  Make meals that can be grilled, baked, poached, roasted, or steamed. These are generally made with less sodium. General information Most people on this plan should limit their sodium intake to 1,500-2,000 mg (milligrams) of sodium each day. What foods should I eat? Fruits Fresh, frozen, or canned fruit. Fruit juice. Vegetables  Fresh or frozen vegetables. "No salt added" canned vegetables. "No salt added" tomato sauce and paste. Low-sodium or reduced-sodium tomato and vegetable juice. Grains Low-sodium cereals, including oats, puffed wheat and rice, and shredded wheat. Low-sodium crackers. Unsalted rice. Unsalted pasta. Low-sodium bread. Whole-grain breads and whole-grain pasta. Meats and other  proteins Fresh or frozen (no salt added) meat, poultry, seafood, and fish. Low-sodium canned tuna and salmon. Unsalted nuts. Dried peas, beans, and lentils without added salt. Unsalted canned beans. Eggs. Unsalted nut butters. Dairy Milk. Soy milk. Cheese that is naturally low in sodium, such as ricotta cheese, fresh mozzarella, or Swiss cheese. Low-sodium or reduced-sodium cheese. Cream cheese. Yogurt. Seasonings and condiments Fresh and dried herbs and spices. Salt-free seasonings. Low-sodium mustard and ketchup. Sodium-free salad dressing. Sodium-free light mayonnaise. Fresh or refrigerated horseradish. Lemon juice. Vinegar. Other foods Homemade, reduced-sodium, or low-sodium soups. Unsalted popcorn and pretzels. Low-salt or salt-free chips. The items listed above may not be a complete list of foods and beverages you can eat. Contact a dietitian for more information. What foods should I avoid? Vegetables Sauerkraut, pickled vegetables, and relishes. Olives. [place] fries. Onion rings. Regular canned vegetables (not low-sodium or reduced-sodium). Regular canned tomato sauce and paste (not low-sodium or reduced-sodium). Regular tomato and vegetable juice (not low-sodium or reduced-sodium). Frozen vegetables in sauces. Grains Instant hot cereals. Bread stuffing, pancake, and biscuit mixes. Croutons. Seasoned rice or pasta mixes. Noodle soup cups. Boxed or frozen macaroni and cheese. Regular salted crackers. Self-rising flour. Meats and other proteins Meat or fish that is salted, canned, smoked, spiced, or pickled. Precooked or cured meat, such as sausages or meat loaves. Berniece Salines. Ham. Pepperoni. Hot dogs. Corned beef. Chipped beef. Salt pork. Jerky. Pickled herring. Anchovies and sardines. Regular canned tuna. Salted nuts. Dairy Processed cheese and cheese spreads. Hard cheeses. Cheese curds. Blue cheese. Feta cheese. String cheese. Regular cottage cheese. Buttermilk. Canned milk. Fats and  oils Salted butter. Regular margarine. Ghee. Bacon fat. Seasonings and condiments Onion salt, garlic salt, seasoned salt, table salt, and sea salt. Canned and packaged gravies. Worcestershire sauce. Tartar sauce. Barbecue sauce. Teriyaki sauce. Soy sauce, including reduced-sodium. Steak sauce. Fish sauce. Oyster sauce. Cocktail sauce. Horseradish that you find on the shelf. Regular ketchup and mustard. Meat flavorings and tenderizers. Bouillon cubes. Hot sauce. Pre-made or packaged marinades. Pre-made or packaged taco seasonings. Relishes. Regular salad dressings. Salsa. Other foods Salted popcorn and pretzels. Corn chips and puffs. Potato and tortilla chips. Canned or dried soups. Pizza. Frozen entrees and pot pies. The items listed above may not be a complete list of foods and beverages you should avoid. Contact a dietitian for more information. Summary  Eating less sodium can help lower your blood pressure, reduce swelling, and protect your heart, liver, and kidneys.  Most people on this plan should limit their sodium intake to 1,500-2,000 mg (milligrams) of sodium each day.  Canned, boxed, and frozen foods are high in sodium. Restaurant foods, fast foods, and pizza are also very high in sodium. You also get sodium by adding salt to food.  Try to cook at home, eat more fresh fruits and vegetables, and eat less fast food and canned, processed, or prepared foods. This information is not intended to replace advice given to you by your health care provider. Make sure you discuss any questions you have with your health care provider. Document Revised: 08/19/2019 Document Reviewed: 06/15/2019 Elsevier Patient Education  2021 [employer].

## 2020-12-20 NOTE — Progress Notes (Signed)
Cardiology Office Note    Date:  12/20/2020   ID:  Paul Ray, DOB 1956-01-23, MRN [id number]  PCP:  Pcp, No  Cardiologist:  Fransico Him, MD   Chief Complaint  Patient presents with  . Follow-up    Acute pericarditis, aortic aneurysm, HTN and HLD    History of Present Illness:  Paul Ray is a 65 y.o. male with a hx of arthritis, ADD, anxiety and ascending aortic aneurysm and HTN.   His last Chest CTA 2021 showed amoderate sized ascending aortic aneurysm measuring 4.7cm and coronary atherosclerosis. 2D echo 11/9/20201 showed moderate ascending aortic aneurysm measuring 59m, normal LVF EF 60-65% with G1DD, mild MR and normal appearing AV with trivial AI.    He was hospitalized April 2022 with chest pain that was atypical but hsTrop was elevated at 173>.158.  EKG showed diffuse ST elevation in the inferior and lateral leads. Chest CTA was neg for PE.  CRP was elevated at 13.7 and ESR was normal.  It was felt he likely had pericarditis.  He had COVID in Jan 2022 and had had a chronic cough since then.  He has a history or right thoracotomy for what sounds like pleurodesis following recurrent pleural effusion secondary to PNA about 10 years ago. He has continued to have chronic right chest wall pain that is worse off of narcotics. Colchicine and Ibuprofen were started for suspicion of pericarditis.  He underwent nuclear stress test which revealed normal perfusion, no ischemia. Echocardiogram was obtained and showed EF 60-65%, mild LVH, grade 1 DD, trivial MR, aortic valve previously reported as bicuspid - appears to have fusion of right and left coronary cusps. AAA 4.7 mm, and trivial pericardial effusion.  He is here today for followup and is doing well.  He denies any chest pain or pressure, SOB, DOE, PND, orthopnea, LE edema, dizziness, palpitations or syncope. He is compliant with his meds and is tolerating meds with no SE.    Past Medical History:  Diagnosis Date  . Abscess of  lung(513.0)    Right lower lobe  . ADD (attention deficit disorder)   . Anxiety   . Arthritis   . [place] ([place]    419mby Chest CTA 05/2020 and 4862my echo 05/2020  . Benign essential HTN   . [place] . Coronary artery calcification seen on CAT scan   . Empyema lung ([facility] . Hemorrhoids   . Hepatitis C   . Pneumonia    last year  . Prostate abscess   . Viral pericarditis    after COVID 19 infection    Past Surgical History:  Procedure Laterality Date  . ELBOW SURGERY  2005   Right  . FOOT NEUROMA SURGERY  2006   Left  . [facility]     [facility]  . LUMBAR LAMINECTOMY/DECOMPRESSION MICRODISCECTOMY  07/24/2011   Procedure: LUMBAR LAMINECTOMY/DECOMPRESSION MICRODISCECTOMY;  Surgeon: DavEustace Moore[facility];  Service: Neurosurgery;  Laterality: Bilateral;  Bilateral  , Lumbar Three-Four, Lumbar Four-Five Decompressive Laminectomy Rm # 32  . LUNG SURGERY     for empyema  . SHOULDER ARTHROSCOPY Right 07/05/2013   Procedure: RIGHT SHOULDER ARTHROSCOPY WITH DEBRIDEMENT;  Surgeon: ChrMcarthur RossettiD;  [facility];  Laterality: Right;  . TENDON TRANSFER Left 01/26/2014   Procedure: LEFT THUMB TRAPEZIECTOMY KNOTTED TENDON INTRAPOSITION TRANSFER OF ABDUCTOR POLLICIS LONGUS TO THENARS;  Surgeon: Cammie Sickle, MD;  Location: [place];  Service: Orthopedics;  Laterality: Left;  . THORACOTOMY  November 14 2010   rt    Current Medications: Current Meds  Medication Sig  . alprazolam (XANAX) 2 MG tablet Take 2 mg by mouth 2 (two) times daily.  Marland Kitchen atorvastatin (LIPITOR) 20 MG tablet Take 1 tablet (20 mg total) by mouth daily.  . colchicine 0.6 MG tablet Take 1 tablet (0.6 mg total) by mouth 2 (two) times daily.  Marland Kitchen lamoTRIgine (LAMICTAL) 200 MG tablet Take 200 mg by mouth in the morning.  . pantoprazole (PROTONIX) 40 MG tablet Take 1 tablet (40 mg total) by mouth  daily.  . tamsulosin (FLOMAX) 0.4 MG CAPS capsule Take 0.8 mg by mouth daily.    Allergies:   Quinolones   Social History   Socioeconomic History  . Marital status: Single    Spouse name: Not on file  . Number of children: 2  . Years of education: Not on file  . Highest education level: Not on file  Occupational History  . Occupation: Games developer  Tobacco Use  . Smoking status: Never Smoker  . Smokeless tobacco: Never Used  Vaping Use  . Vaping Use: Never used  Substance and Sexual Activity  . Alcohol use: No    Comment: quit drinking in 1989  . Drug use: No  . Sexual activity: Not on file  Other Topics Concern  . Not on file  Social History Narrative   2 children: [name] and [name]   Social Determinants of Health   Financial Resource Strain: Not on file  Food Insecurity: Not on file  Transportation Needs: Not on file  Physical Activity: Not on file  Stress: Not on file  Social Connections: Not on file     Family History:  The patient's family history includes Breast cancer in his maternal grandmother; COPD in his mother; Cancer in his paternal grandmother; Coronary artery disease in his father; Dementia in his father; Heart disease in his mother; Heart failure in his father; Hypertension in his mother; Prostate cancer in his paternal grandfather.   ROS:   Please see the history of present illness.    ROS All other systems reviewed and are negative.  PAD Screen 06/15/2020  Previous PAD dx? No  Previous surgical procedure? No  Pain with walking? No  Feet/toe relief with dangling? No  Painful, non-healing ulcers? No  Extremities discolored? No    PHYSICAL EXAM:   VS:  BP (!) 154/78   Pulse 70   Ht '5\' 8"'  (1.727 m)   Wt 159 lb 9.6 oz (72.4 kg)   SpO2 97%   BMI 24.27 kg/m    GEN: Well nourished, well developed in no acute distress HEENT: Normal NECK: No JVD; No carotid bruits LYMPHATICS: No lymphadenopathy CARDIAC:RRR, no murmurs, rubs, gallops RESPIRATORY:   Clear to auscultation without rales, wheezing or rhonchi  ABDOMEN: Soft, non-tender, non-distended MUSCULOSKELETAL:  No edema; No deformity  SKIN: Warm and dry NEUROLOGIC:  Alert and oriented x 3 PSYCHIATRIC:  Normal affect    Wt Readings from Last 3 Encounters:  12/20/20 159 lb 9.6 oz (72.4 kg)  11/10/20 164 lb 7.4 oz (74.6 kg)  10/02/20 155 lb (70.3 kg)      Studies/Labs Reviewed:   EKG:  EKG is not ordered today.    Recent Labs: 08/20/2020: ALT 15 11/11/2020: BUN 14; Creatinine, Ser 0.71; Hemoglobin 13.9; Platelets 157; Potassium 3.8; Sodium 136  Lipid Panel    Component Value Date/Time   CHOL 97 11/10/2020 0301   TRIG 40 11/10/2020 0301   HDL 52 11/10/2020 0301   CHOLHDL 1.9 11/10/2020 0301   VLDL 8 11/10/2020 0301   LDLCALC 37 11/10/2020 0301      Additional studies/ records that were reviewed today include:  Notes from CVTS and chest CTA  2D echo 06/05/2020 IMPRESSIONS   1. Aortic valve leaflets are not thickened or calcified and appear to  open well.  2. There is severe ascending aortic aneurysm with maximum diameter 48 mm,  a gated chest CTA is recommended for further evaluation.  3. Left ventricular ejection fraction, by estimation, is 60 to 65%. The  left ventricle has normal function. The left ventricle has no regional  wall motion abnormalities. Left ventricular diastolic parameters are  consistent with Grade I diastolic  dysfunction (impaired relaxation). The average left ventricular global  longitudinal strain is -19.2 %. The global longitudinal strain is normal.  4. Right ventricular systolic function is normal. The right ventricular  size is normal. There is normal pulmonary artery systolic pressure.  5. The mitral valve is normal in structure. Mild mitral valve  regurgitation. No evidence of mitral stenosis.  6. The aortic valve is normal in structure. Aortic valve regurgitation is  trivial. No aortic stenosis is present.  7. Aortic  dilatation noted. There is severe dilatation of the ascending  aorta, measuring 48 mm.  8. The inferior vena cava is normal in size with greater than 50%  respiratory variability, suggesting right atrial pressure of 3 mmHg.    ASSESSMENT:    1. Ascending aortic aneurysm ([place])   2. Benign essential HTN   3. Coronary artery calcification seen on CAT scan   4. Pure hypercholesterolemia   5. Acute viral pericarditis      PLAN:  In order of problems listed above:  1.  Ascending aortic aneurysm -I have personally reviewed and interpreted outside Chest CTA performed during hospital admission last month which showed stable moderate aortic aneurysm measuring 4.7cm -he also has a functional bicuspid AV but echo -followed by CVTS -His BP is not well controlled on exam today -Abd CT in 2019 with no AAA  2.  HTN -BP is elevated on exam today -will add Toprol XL 70m daily -followup with PharmD in 2 weeks to make sure patient's BP is stable  3.  Coronary artery calcifications -noted on chest CT -coronary Ca score was 0 in Nov 2021  4.  HLD -LDL goal < 100 -I have personally reviewed and interpreted outside labs performed by patient's PCP which showed LDL 37, HDL 52, TAGs 40 in April 2022 and normal ALT at 15 in Jan 2022 -Continue prescription drug management with Atorvastatin 274mdaily  5.  Acute Pericarditis -? Sequale of COVID 19 infection -CP resolved on Ibuprofen and Colchicine -Continue prescription drug management with Colchicine 0.7m20mnd decrease to 1 tablet daily for 2 more months and then stop  Medication Adjustments/Labs and Tests Ordered: Current medicines are reviewed at length with the patient today.  Concerns regarding medicines are outlined above.  Medication changes, Labs and Tests ordered today are listed in the Patient Instructions below.  There are no Patient Instructions on file for this visit.   Signed, TraFransico HimD  12/20/2020 10:09 AM    [facility] [address] [place]  [id number]: [phone]: [phone]

## 2020-12-20 NOTE — Progress Notes (Signed)
The patient comes in today with a recurrent olecranon bursitis of the right elbow.  I aspirated this twice.  There is no evidence of infection of the right elbow olecranon area.  I was able to aspirate about 2 cc of fluid from this area which decompressed it again.  He does have a small area at his fingertip on the right index finger that may have some purulence from may be a splinter.  There is no redness but I did express something from this I will put him on doxycycline twice a day.  I expect there will be aspirating the elbow again at some point and have given him reassurance that he will eventually run its course.  All question concerns were answered addressed.  Follow-up is as needed.

## 2020-12-20 NOTE — Addendum Note (Signed)
Addended by: Antonieta Iba on: 12/20/2020 10:23 AM   Modules accepted: Orders

## 2021-01-07 ENCOUNTER — Telehealth: Payer: Self-pay | Admitting: Cardiology

## 2021-01-07 NOTE — Telephone Encounter (Signed)
Spoke with the patient and explained the reasoning for his appointment with the pharmacist in hypertension clinic. Patient states that he does not feel comfortable having to pay a co-pay just to see the pharmacist for a blood pressure check. Patient does not have a blood pressure monitor at home. Encouraged patient to keep his appointment however he declined. Patient states that he has been feeling good. Advised patient to continue on his medications as prescribed. Also advised to have his blood pressure checked at a pharmacy so we can get an idea of how it is. Patient verbalized understanding.

## 2021-01-07 NOTE — Telephone Encounter (Signed)
Patient is requesting to speak with Dr. Theodosia Blender nurse regarding his referral to PharmD for hypertension. He initially in called to find out what this appointment was for. After several attempts at explaining it is for hypertension management and to discuss his options possibly regarding medication, he continuously stated that he still does not understand the purpose. He said it is "bizarre/weird to see a pharmacist." He doesn't understand why he can't see his cardiologist, Dr. Radford Pax. He stated he didn't know what hypertension is and I explained that it is elevated BP, which he  verbalized he was made aware of. He continued inquiring why this appointment is necessary and eventually stated that he will not be seeing a pharmacist. I offered to cancel and and message Dr. Kathie Rhodes and he initially declined. After discussing a little further, he states he is willing to hear from the nurse to determine whether or not he will keep this appointment.

## 2021-01-10 ENCOUNTER — Ambulatory Visit: Payer: PPO

## 2021-01-21 DIAGNOSIS — N5201 Erectile dysfunction due to arterial insufficiency: Secondary | ICD-10-CM | POA: Diagnosis not present

## 2021-01-22 ENCOUNTER — Telehealth (HOSPITAL_COMMUNITY): Payer: Self-pay | Admitting: Cardiology

## 2021-01-22 NOTE — Telephone Encounter (Signed)
Spoke with the patient and answered his questions in regards to his medications. Advised that he can stop the colchicine and pantoprazole in one month. Patient verbalized understanding. Patient reports recent blood pressure on 127/66. Advised to continue with Toprol XL 25 mg daily.

## 2021-01-22 NOTE — Addendum Note (Signed)
Addended by: Antonieta Iba on: 01/22/2021 09:39 AM   Modules accepted: Orders

## 2021-01-22 NOTE — Telephone Encounter (Signed)
Patient called  the Vascular Imaging department and left a voicemail that he had some questions for Dr. Landis Gandy Nurse regarding his medications.  He states its kinda urgent and wold like to talk to someone as soon as possible. Thank you

## 2021-02-20 ENCOUNTER — Ambulatory Visit: Payer: PPO | Admitting: Registered Nurse

## 2021-02-25 ENCOUNTER — Telehealth: Payer: Self-pay | Admitting: Orthopaedic Surgery

## 2021-02-25 ENCOUNTER — Telehealth: Payer: Self-pay | Admitting: Physical Medicine and Rehabilitation

## 2021-02-25 NOTE — Telephone Encounter (Signed)
Pt calling wanting to speak with Caryl Pina. Pt came to [facility] previously but was referred up to [facility], pt now in pain again and unsure if he should sch an appt with Ninfa Linden first or just go straight to [facility] for the issue. The best call back number is [phone].

## 2021-02-25 NOTE — Telephone Encounter (Signed)
I called and talked to the pt. He stated he wanted to know if he should see Dr. Ninfa Linden again or Dr. Ernestina Patches about his chronic neck pain. Pt stated he wanted to discuss if surgery was an option. I informed him that if he wanted to discuss this he should see one of our Doctors who does spine surgery. He stated understanding. Pt was scheduled to see dr. Lorin Mercy per his request

## 2021-02-26 ENCOUNTER — Encounter: Payer: Self-pay | Admitting: Registered Nurse

## 2021-02-26 ENCOUNTER — Ambulatory Visit (INDEPENDENT_AMBULATORY_CARE_PROVIDER_SITE_OTHER): Payer: PPO | Admitting: Registered Nurse

## 2021-02-26 ENCOUNTER — Other Ambulatory Visit: Payer: Self-pay

## 2021-02-26 VITALS — BP 131/71 | HR 62 | Temp 98.4°F | Resp 18 | Ht 68.0 in

## 2021-02-26 DIAGNOSIS — G479 Sleep disorder, unspecified: Secondary | ICD-10-CM

## 2021-02-26 DIAGNOSIS — I712 Thoracic aortic aneurysm, without rupture: Secondary | ICD-10-CM | POA: Diagnosis not present

## 2021-02-26 DIAGNOSIS — F319 Bipolar disorder, unspecified: Secondary | ICD-10-CM | POA: Diagnosis not present

## 2021-02-26 DIAGNOSIS — R0781 Pleurodynia: Secondary | ICD-10-CM

## 2021-02-26 DIAGNOSIS — R0789 Other chest pain: Secondary | ICD-10-CM

## 2021-02-26 DIAGNOSIS — I7121 Aneurysm of the ascending aorta, without rupture: Secondary | ICD-10-CM

## 2021-02-26 MED ORDER — LAMOTRIGINE 200 MG PO TABS: 200.0000 mg | ORAL_TABLET | Freq: Every morning | ORAL | 0 refills | Status: DC

## 2021-02-26 MED ORDER — ATORVASTATIN CALCIUM 20 MG PO TABS: 20.0000 mg | ORAL_TABLET | Freq: Every day | ORAL | 3 refills | Status: DC

## 2021-02-26 MED ORDER — DICLOFENAC SODIUM 1 % EX GEL: 2.0000 g | Freq: Four times a day (QID) | CUTANEOUS | 1 refills | Status: DC

## 2021-02-26 MED ORDER — ALPRAZOLAM 2 MG PO TABS: 2.0000 mg | ORAL_TABLET | Freq: Two times a day (BID) | ORAL | 0 refills | Status: DC

## 2021-02-26 MED ORDER — DICLOFENAC SODIUM 50 MG PO TBEC: 50.0000 mg | DELAYED_RELEASE_TABLET | Freq: Two times a day (BID) | ORAL | 1 refills | Status: DC

## 2021-02-26 NOTE — Progress Notes (Signed)
New Patient Office Visit  Subjective:  Patient ID: Paul Ray, male    DOB: September 07, 1955  Age: 65 y.o. MRN: [id number]  CC:  Chief Complaint  Patient presents with   New Patient (Initial Visit)    Patient states he is here to establish care and medication.    HPI [name] presents for visit to est care.  Histories reviewed and updated with patient.   Hx of empyema and lung abscess. R lower lobe. Over past few months has noted new R lower chest pain. Stabbing pain, can get to 10/10. Has been told it's neuropathic pain. Unsure of mechanism for this. No breathing issues or symptoms at this time.   Neck pain: sees Dr. Ninfa Linden and Dr. Ernestina Patches - injections every few months. Good effect. Interested in further relief in the interim. Formerly on long term daily opiates - wants to avoid this if at all possible.   Sleep disturbance - wakes 3 times every night, usually for up to an hour at a time. Snores when he sleeps. Wakes with headache and dry mouth. Has been taking alprazolam '2mg'$  po bid per psychiatry Dr. Toy Care, then managed by his past PCP.   Past Medical History:  Diagnosis Date   Abscess of lung(513.0)    Right lower lobe   ADD (attention deficit disorder)    Anxiety    Arthritis    Ascending aortic aneurysm (HCC)    68m by Chest CTA 05/2020 and 419mby echo 05/2020   Benign essential HTN    Bipolar depression ([facility]   Coronary artery calcification seen on CAT scan    [place] lung ([place]   Hemorrhoids    Hepatitis C    Pneumonia    last year   Prostate abscess    Viral pericarditis    after COVID 19 infection    Past Surgical History:  Procedure Laterality Date   ELBOW SURGERY  2005   Right   FOOT NEUROMA SURGERY  2006   Left   [facility]     [facility]   LUMBAR LAMINECTOMY/DECOMPRESSION MICRODISCECTOMY  07/24/2011   Procedure: LUMBAR LAMINECTOMY/DECOMPRESSION MICRODISCECTOMY;  Surgeon: DaEustace Moore [facility];  Service:  Neurosurgery;  Laterality: Bilateral;  Bilateral  , Lumbar Three-Four, Lumbar Four-Five Decompressive Laminectomy Rm # 32   LUNG SURGERY     for empyema   SHOULDER ARTHROSCOPY Right 07/05/2013   Procedure: RIGHT SHOULDER ARTHROSCOPY WITH DEBRIDEMENT;  Surgeon: ChMcarthur RossettiMD;  [facility]: [facility];  Laterality: Right;   TENDON TRANSFER Left 01/26/2014   Procedure: LEFT THUMB TRAPEZIECTOMY KNOTTED TENDON INTRAPOSITION TRANSFER OF ABDUCTOR POLLICIS LONGUS [place] Surgeon: RoCammie SickleMD;  [facility] Service: Orth[facility]ics;  Laterality: Left;   THORACOTOMY  November 14 2010   rt    Family History  Problem Relation Age of Onset   COPD Mother    Heart disease Mother    Hypertension Mother    Coronary artery disease Father    Dementia Father    Heart failure Father    Prostate cancer Paternal Grandfather        also had bone cancer   Breast cancer Maternal Grandmother    Cancer Paternal Grandmother        unsure what kind    Social History   Socioeconomic History   Marital status: Single    Spouse name: Not on  file   Number of children: 2   Years of education: Not on file   Highest education level: Not on file  Occupational History   Occupation: carpenter  Tobacco Use   Smoking status: Never   Smokeless tobacco: Never  Vaping Use   Vaping Use: Never used  Substance and Sexual Activity   Alcohol use: No    Comment: quit drinking in 1989   Drug use: No   Sexual activity: Not on file  Other Topics Concern   Not on file  Social History Narrative   2 children: [name] and [name]   Social Determinants of Health   Financial Resource Strain: Not on file  Food Insecurity: Not on file  Transportation Needs: Not on file  Physical Activity: Not on file  Stress: Not on file  Social Connections: Not on file  Intimate Partner Violence: Not on file    ROS Review of Systems  Constitutional: Negative.   HENT:  Negative.    Eyes: Negative.   Respiratory: Negative.    Cardiovascular: Negative.   Gastrointestinal: Negative.   Genitourinary: Negative.   Musculoskeletal: Negative.   Skin: Negative.   Neurological: Negative.   Psychiatric/Behavioral: Negative.    All other systems reviewed and are negative.  Objective:   Today's Vitals: BP 131/71   Pulse 62   Temp 98.4 F (36.9 C) (Temporal)   Resp 18   Ht '5\' 8"'$  (1.727 m)   SpO2 100%   BMI 24.27 kg/m   Physical Exam Vitals and nursing note reviewed.  Constitutional:      Appearance: Normal appearance.  Cardiovascular:     Rate and Rhythm: Normal rate and regular rhythm.     Pulses: Normal pulses.     Heart sounds: Normal heart sounds. No murmur heard.   No friction rub. No gallop.  Pulmonary:     Effort: Pulmonary effort is normal. No respiratory distress.     Breath sounds: Normal breath sounds. No stridor. No wheezing, rhonchi or rales.  Neurological:     General: No focal deficit present.     Mental Status: He is alert. Mental status is at baseline.  Psychiatric:        Mood and Affect: Mood normal.        Behavior: Behavior normal.        Thought Content: Thought content normal.        Judgment: Judgment normal.    Assessment & Plan:   Problem List Items Addressed This Visit       Cardiovascular and Mediastinum   Ascending aortic aneurysm (HCC)   Relevant Medications   sildenafil (VIAGRA) 50 MG tablet   atorvastatin (LIPITOR) 20 MG tablet     Other   Bipolar depression (HCC)   Relevant Medications   lamoTRIgine (LAMICTAL) 200 MG tablet   Rib pain - Primary   Relevant Medications   diclofenac (VOLTAREN) 50 MG EC tablet   diclofenac Sodium (VOLTAREN) 1 % GEL   Other Visit Diagnoses     Sleep disturbance       Relevant Medications   alprazolam (XANAX) 2 MG tablet       Outpatient Encounter Medications as of 02/26/2021  Medication Sig   diclofenac (VOLTAREN) 50 MG EC tablet Take 1 tablet (50 mg total) by  mouth 2 (two) times daily.   diclofenac Sodium (VOLTAREN) 1 % GEL Apply 2 g topically 4 (four) times daily.   sildenafil (VIAGRA) 50 MG tablet Take 50 mg by mouth daily  as needed.   [DISCONTINUED] alprazolam (XANAX) 2 MG tablet Take 2 mg by mouth 2 (two) times daily.   [DISCONTINUED] atorvastatin (LIPITOR) 20 MG tablet Take 1 tablet (20 mg total) by mouth daily.   [DISCONTINUED] lamoTRIgine (LAMICTAL) 200 MG tablet Take 200 mg by mouth in the morning.   alprazolam (XANAX) 2 MG tablet Take 1 tablet (2 mg total) by mouth 2 (two) times daily.   atorvastatin (LIPITOR) 20 MG tablet Take 1 tablet (20 mg total) by mouth daily.   lamoTRIgine (LAMICTAL) 200 MG tablet Take 1 tablet (200 mg total) by mouth in the morning.   [DISCONTINUED] colchicine 0.6 MG tablet Take 1 tablet (0.6 mg total) by mouth daily. (Patient not taking: Reported on 02/26/2021)   [DISCONTINUED] metoprolol succinate (TOPROL XL) 25 MG 24 hr tablet Take 1 tablet (25 mg total) by mouth daily.   [DISCONTINUED] pantoprazole (PROTONIX) 40 MG tablet Take 1 tablet (40 mg total) by mouth daily.   No facility-administered encounter medications on file as of 02/26/2021.    Follow-up: Return in about 5 months (around 07/29/2021) for CPE and labs.   PLAN Refill meds as above. Had a fairly in depth discussion regarding risks, side effects, and alternatives to benzodiazepines - he will need to again be managed by psychiatry if he is hoping to continue this long term. Will refill for now as I do not want to risk withdrawal. He plans to see hypnotist to help with his sleep. Will hold on other sedative medications for the time being. Try topical or po diclofenac for pain. Discussed risks of these medications. Voices understanding. Suggest labs but pt defers until his CPE which is due in Dec. Reviewed risks of deferring. Pt voices understanding Patient encouraged to call clinic with any questions, comments, or concerns.  I spent 52 minutes with this  patient reviewing history, coordinating medication refills, reviewing high risk medication use, and planning follow up.  Maximiano Coss, NP

## 2021-02-26 NOTE — Patient Instructions (Signed)
Mr. Paul Ray to meet you.  Medications refilled - call when you're due again for another refill.  We should continue to consider trimming down the dosage of the alprazolam - it's a very potent medication with addiction and abuse potential. It also increases the risk of developing dementia. See: https://link.springer.com/article/10.1007/s40263-917-435-1618-3  I think starting hypnosis could be remarkably helpful. If not, we can consider consult with psychiatry. Getting back in to see Dr. Toy Care may be beneficial, but we could also consider a fresh set of eyes  Ok to use diclofenac once or twice daily (pill) for inflammation. If using topically, up to four times daily  See you in Dec for your physical and lab work  Thank you  [employer]

## 2021-03-05 ENCOUNTER — Ambulatory Visit: Payer: PPO | Admitting: Orthopaedic Surgery

## 2021-03-06 ENCOUNTER — Other Ambulatory Visit: Payer: Self-pay

## 2021-03-06 ENCOUNTER — Ambulatory Visit (INDEPENDENT_AMBULATORY_CARE_PROVIDER_SITE_OTHER): Payer: PPO | Admitting: Orthopaedic Surgery

## 2021-03-06 ENCOUNTER — Encounter: Payer: Self-pay | Admitting: Orthopaedic Surgery

## 2021-03-06 VITALS — BP 131/76 | HR 65

## 2021-03-06 DIAGNOSIS — M542 Cervicalgia: Secondary | ICD-10-CM | POA: Diagnosis not present

## 2021-03-06 DIAGNOSIS — M4802 Spinal stenosis, cervical region: Secondary | ICD-10-CM

## 2021-03-06 NOTE — Progress Notes (Addendum)
`  Office Visit Note   Patient: Paul Ray           Date of Birth: [birth year]           MRN: [id number] Visit Date: 03/06/2021              Requested by: Maximiano Coss, NP [address] [place],  [place] [address] PCP: Maximiano Coss, NP   Assessment & Plan: Visit Diagnoses:  1. Neck pain   2. Foraminal stenosis of cervical region     Plan: Patient has multilevel cervical foraminal stenosis severe at C6-7.  Moderate to severe at C3-4 and 4-5.  He has biceps weakness on the right I recommend EMGs nerve conduction velocities since he has multilevel disease this may give additional information and assist in determining surgical levels the need to be addressed.  Office follow-up after studies.  Follow-Up Instructions: Return after upper extremity EMG Velocities.  Orders:  Orders Placed This Encounter  Procedures   Ambulatory referral to Physical Medicine Rehab   No orders of the defined types were placed in this encounter.     Procedures: No procedures performed   Clinical Data: No additional findings.   Subjective: Chief Complaint  Patient presents with   Neck - Pain    HPI 65 year old male returns with ongoing problems with neck pain.  MRI was 10/09/2020.  Patient not taking any pain medication currently.  Pain is worse on the right than left.  Has been through physical therapy.  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  He does pick up some scrap metal as a hobby.  Other problems include bipolar depression, hepatitis C, 4.7 cm ascending thoracic aneurysm.  Shoulder osteoarthritis.  Patient is noticed weakness with pulling particularly on the right arm but not noticed weakness with pushing.  Review of Systems all the systems are negative is obtained HPI.   Objective: Vital Signs: BP 131/76   Pulse 65   Physical Exam Constitutional:      Appearance: He is well-developed.  HENT:     Head: Normocephalic and atraumatic.     Right Ear: External  ear normal.     Left Ear: External ear normal.  Eyes:     Pupils: Pupils are equal, round, and reactive to light.  Neck:     Thyroid: No thyromegaly.     Trachea: No tracheal deviation.  Cardiovascular:     Rate and Rhythm: Normal rate.  Pulmonary:     Effort: Pulmonary effort is normal.     Breath sounds: No wheezing.  Abdominal:     General: Bowel sounds are normal.     Palpations: Abdomen is soft.  Musculoskeletal:     Cervical back: Neck supple.  Skin:    General: Skin is warm and dry.     Capillary Refill: Capillary refill takes less than 2 seconds.  Neurological:     Mental Status: He is alert and oriented to person, place, and time.  Psychiatric:        Behavior: Behavior normal.        Thought Content: Thought content normal.        Judgment: Judgment normal.    Ortho Exam patient has right biceps atrophy right biceps weakness with normal triceps on the right.  Left triceps biceps is normal.  Trace reflex were biceps on the right all others are 2+.  No lower extremity clonus no hyperreflexia normal gait.  Positive Spurling  right greater than left.  Negative Lhermitte.  He is able to do a push-up has no tricep atrophy or weakness right or left.  He has some interosseous weakness and trace atrophy right and left.   Specialty Comments:  No specialty comments available.  Imaging: CLINICAL DATA:  Spinal stenosis.   EXAM: MRI CERVICAL SPINE WITHOUT CONTRAST   TECHNIQUE: Multiplanar, multisequence MR imaging of the cervical spine was performed. No intravenous contrast was administered.   COMPARISON:  None.   FINDINGS: Alignment: Normal   Vertebrae: Vertebral body heights are maintained. No specific evidence of acute fracture, discitis/osteomyelitis, or suspicious bone lesion.   Cord: Normal cord signal   Posterior Fossa, vertebral arteries, paraspinal tissues: Remote appearing bilateral cerebellar lacunar infarcts, partially imaged. Bilateral vertebral artery  flow voids are maintained.   Disc levels:   Motion limited evaluation.   C2-C3: Bilateral facet and uncovertebral hypertrophy. Mild bilateral foraminal stenosis without significant canal stenosis.   C3-C4: Posterior disc osteophyte complex and bilateral facet and uncovertebral hypertrophy with moderate to severe left greater than right foraminal stenosis. Mild canal stenosis.   C4-C5: Right eccentric posterior disc osteophyte complex with right greater than left facet and uncovertebral hypertrophy. Resulting moderate to severe right foraminal stenosis with narrowing of the right root entry zone. Mild left foraminal stenosis. Mild canal stenosis.   C5-C6: Posterior disc osteophyte complex with bilateral facet and uncovertebral hypertrophy. Moderate right and mild left foraminal stenosis. Mild canal stenosis.   C6-C7: Small posterior disc osteophyte complex and bilateral facet and uncovertebral hypertrophy. Severe bilateral foraminal stenosis and mild to moderate canal stenosis.   C7-T1: No significant disc protrusion, foraminal stenosis, or canal stenosis.   IMPRESSION: 1. Severe bilateral foraminal stenosis at C6-C7. Moderate to severe foraminal stenosis bilaterally at C3-C4 and on the right at C4-C5. Moderate foraminal stenosis on the right at C5-C6. Multilevel mild foraminal stenosis is detailed above. 2. Mild to moderate canal stenosis at C6-C7 and multilevel mild canal stenosis.     Electronically Signed   By: Margaretha Sheffield MD   On: 10/09/2020 08:51   PMFS History: Patient Active Problem List   Diagnosis Date Noted   Foraminal stenosis of cervical region 03/08/2021   Viral pericarditis    Chest pain 11/09/2020   Ascending aortic aneurysm (HCC)    Benign essential HTN    Coronary artery calcification seen on CAT scan    Psychoactive substance-induced psychosis ([place])    Rib pain 07/01/2017   Closed fracture of one rib of left side 07/01/2017   Chronic  right shoulder pain 05/04/2017   Osteoarthritis of right hip 12/02/2016   Impingement syndrome of right shoulder 07/05/2013   Empyema lung ([place])    Abscess of lung(513.0)    Bipolar depression ([place])    ADD (attention deficit disorder)    Hepatitis C    Hip pain 05/02/2011   History of total hip arthroplasty 04/23/2011   Lung mass 10/22/2010   Pleural effusion 10/15/2010   Past Medical History:  Diagnosis Date   Abscess of lung(513.0)    Right lower lobe   ADD (attention deficit disorder)    Anxiety    Arthritis    Ascending aortic aneurysm (HCC)    17m by Chest CTA 05/2020 and 481mby echo 05/2020   Benign essential HTN    [place]   Coronary artery calcification seen on CAT scan    Empyema lung (HCC)    Hemorrhoids    Hepatitis C  Pneumonia    last year   Prostate abscess    Viral pericarditis    after COVID 19 infection    Family History  Problem Relation Age of Onset   COPD Mother    Heart disease Mother    Hypertension Mother    Coronary artery disease Father    Dementia Father    Heart failure Father    Prostate cancer Paternal Grandfather        also had bone cancer   Breast cancer Maternal Grandmother    Cancer Paternal Grandmother        unsure what kind    Past Surgical History:  Procedure Laterality Date   ELBOW SURGERY  2005   Right   FOOT NEUROMA SURGERY  2006   Left   HIP RESURFACING     left hip at [facility] 01/2011   LUMBAR LAMINECTOMY/DECOMPRESSION MICRODISCECTOMY  07/24/2011   Procedure: LUMBAR LAMINECTOMY/DECOMPRESSION MICRODISCECTOMY;  Surgeon: Eustace Moore;  Location: [facility];  Service: Neurosurgery;  Laterality: Bilateral;  Bilateral  , Lumbar Three-Four, Lumbar Four-Five Decompressive Laminectomy Rm # [id number]   LUNG SURGERY     for empyema   SHOULDER ARTHROSCOPY Right 07/05/2013   Procedure: RIGHT SHOULDER ARTHROSCOPY WITH DEBRIDEMENT;  Surgeon: Mcarthur Rossetti, MD;  Location: [facility];  Service: Orthopedics;   Laterality: Right;   TENDON TRANSFER Left 01/26/2014   Procedure: LEFT THUMB TRAPEZIECTOMY KNOTTED TENDON INTRAPOSITION TRANSFER OF ABDUCTOR POLLICIS LONGUS TO [place];  Surgeon: Cammie Sickle, MD;  Location: [facility];  Service: Orthopedics;  Laterality: Left;   THORACOTOMY  November 14 2010   rt   Social History   Occupational History   Occupation: Games developer  Tobacco Use   Smoking status: Never   Smokeless tobacco: Never  Vaping Use   Vaping Use: Never used  Substance and Sexual Activity   Alcohol use: No    Comment: quit drinking in 1989   Drug use: No   Sexual activity: Not on file

## 2021-03-07 ENCOUNTER — Telehealth: Payer: Self-pay | Admitting: Orthopaedic Surgery

## 2021-03-07 ENCOUNTER — Other Ambulatory Visit: Payer: Self-pay

## 2021-03-07 DIAGNOSIS — M542 Cervicalgia: Secondary | ICD-10-CM

## 2021-03-07 NOTE — Telephone Encounter (Signed)
Patient called wanting Dr Lorin Mercy to know he is very uncomfortable and still in a lot of pain. Patient said he do not want any pain medicine and he scheduled to see Dr Ernestina Patches in 2 weeks and would like a sooner appointment. The number to contact patient is [phone]

## 2021-03-07 NOTE — Telephone Encounter (Signed)
Added to wait list.

## 2021-03-08 DIAGNOSIS — M4802 Spinal stenosis, cervical region: Secondary | ICD-10-CM | POA: Insufficient documentation

## 2021-03-13 ENCOUNTER — Ambulatory Visit: Payer: PPO | Admitting: Orthopaedic Surgery

## 2021-03-18 ENCOUNTER — Telehealth: Payer: Self-pay | Admitting: Orthopaedic Surgery

## 2021-03-18 NOTE — Telephone Encounter (Signed)
Pt wanting a call back from Dr. Ninfa Linden specifically to ask if he can give him a second opinion of his neck and wanted to know who he would recommend. Pt has seen Dr. Lorin Mercy and has an upcoming appt with Dr. Ernestina Patches. Pt can not go to [place] [facility]. The best call back number is [phone]. Caryl Pina

## 2021-03-18 NOTE — Telephone Encounter (Signed)
Pt called requesting CD discs from Dr. Lorin Mercy and Dr. Ninfa Linden. Pt needs copies of X Rays, MRI, and CT Scan. He has an upcoming appt 8/26 with Dr. Ernestina Patches. Explained to pt possible pick up on his appt date. Please call pt about this matter if needed at [phone].

## 2021-03-18 NOTE — Telephone Encounter (Signed)
Received medical records release form

## 2021-03-19 NOTE — Telephone Encounter (Signed)
Spoke with patient and advised CD is ready for pick up and we are unable to burn MRI and CT to a CD.

## 2021-03-19 NOTE — Telephone Encounter (Signed)
Patient aware of the below message  

## 2021-03-21 ENCOUNTER — Telehealth: Payer: Self-pay

## 2021-03-21 ENCOUNTER — Other Ambulatory Visit: Payer: Self-pay

## 2021-03-21 DIAGNOSIS — G479 Sleep disorder, unspecified: Secondary | ICD-10-CM

## 2021-03-21 NOTE — Telephone Encounter (Signed)
Sent to provider for approval

## 2021-03-21 NOTE — Telephone Encounter (Signed)
Pt needs refill on alprazolam (XANAX) 2 MG tablet [id number]- [place], [place] - [address] [place]    Pt call back [phone]   Wants to talk to a clinical person and don't know the reason until someone calls?    LFD 02/26/21 #60 with no refills LOV 02/26/21 NOV 07/30/21  Called patient as requested, patient just wanted to make sure and get his request in on time as to not run out of medication as that has happened before.

## 2021-03-21 NOTE — Telephone Encounter (Signed)
Pt needs refill on alprazolam (XANAX) 2 MG tablet [id number] [place], [place] - [address] [place]   Pt call back [phone]  Wants to talk to a clinical person and don't know the reason until someone calls?

## 2021-03-22 ENCOUNTER — Ambulatory Visit (INDEPENDENT_AMBULATORY_CARE_PROVIDER_SITE_OTHER): Payer: PPO | Admitting: Physical Medicine and Rehabilitation

## 2021-03-22 ENCOUNTER — Other Ambulatory Visit: Payer: Self-pay

## 2021-03-22 ENCOUNTER — Encounter: Payer: Self-pay | Admitting: Physical Medicine and Rehabilitation

## 2021-03-22 DIAGNOSIS — R202 Paresthesia of skin: Secondary | ICD-10-CM | POA: Diagnosis not present

## 2021-03-22 MED ORDER — ALPRAZOLAM 2 MG PO TABS: 2.0000 mg | ORAL_TABLET | Freq: Two times a day (BID) | ORAL | 0 refills | Status: DC

## 2021-03-22 NOTE — Telephone Encounter (Signed)
Controlled substance database reviewed, last filled on February 26, 2021.  Refill ordered to be filled when due.  Further refills will be determined by his PCP based on last note.  Routed to PCP as FYI.

## 2021-03-22 NOTE — Progress Notes (Signed)
Neck and right shoulder pain.  Right hand dominant No lotion per patient Numeric Pain Rating Scale and Functional Assessment Average Pain 9   In the last MONTH (on 0-10 scale) has pain interfered with the following?  1. General activity like being  able to carry out your everyday physical activities such as walking, climbing stairs, carrying groceries, or moving a chair?  Rating(10)

## 2021-03-25 NOTE — Progress Notes (Signed)
Paul Ray - 65 y.o. male MRN [id number]  Date of birth: March 01, 1956  Office Visit Note: Visit Date: 03/22/2021 PCP: Maximiano Coss, NP Referred by: Maximiano Coss, NP  Subjective: Chief Complaint  Patient presents with   Neck - Pain   Right Shoulder - Pain   HPI:  Paul Ray is a 65 y.o. male who comes in today at the request of Dr. Rodell Perna for electrodiagnostic study of the Right upper extremities.  Patient is Right hand dominant.  He reports 9 out of 10 chronic worsening and severe neck and right shoulder pain.  He denies any numbness tingling in the hands.  He denies any left-sided complaints.  He does feel like he has weakness in the right bicep.  He still tries to maintain activity level but he really cannot do that do the amount of pain.  He has had MRI of the cervical spine which is reviewed again below in the notes.  He did want to discuss this further today and we did go over his report.  He will go over the findings on the MRI again with Dr. Lorin Mercy and follow-up appointment.  Cervical epidural injection did not seem to give him much relief.  No prior history of carpal tunnel release or carpal tunnel syndrome.   ROS Otherwise per HPI.  Assessment & Plan: Visit Diagnoses:    ICD-10-CM   1. Paresthesia of skin  R20.2 NCV with EMG (electromyography)      Plan: Impression: The above electrodiagnostic study is ABNORMAL and reveals evidence of:  A moderate/severe chronic C5 radiculopathy on the right.    A mild right median nerve entrapment at the wrist affecting sensory components.  This seems to be asymptomatic.  There is no significant electrodiagnostic evidence of any other focal nerve entrapment or brachial plexopathy.   Recommendations: 1.  Follow-up with referring physician. 2.  Continue current management of symptoms. 3.  Continue use of resting splint at night-time and as needed during the day. 4.  Suggest surgical evaluation.  Meds & Orders: No orders of  the defined types were placed in this encounter.   Orders Placed This Encounter  Procedures   NCV with EMG (electromyography)    Follow-up: Return in about 2 weeks (around 04/05/2021) for Rodell Perna, MD.   Procedures: No procedures performed  EMG & NCV Findings: Evaluation of the right median (across palm) sensory nerve showed prolonged distal peak latency (4.0 ms).  All remaining nerves (as indicated in the following tables) were within normal limits.    Needle evaluation of the right deltoid and the right biceps muscles showed increased insertional activity, moderately increased spontaneous activity, and diminished recruitment.  All remaining muscles (as indicated in the following table) showed no evidence of electrical instability.    Impression: The above electrodiagnostic study is ABNORMAL and reveals evidence of:  A moderate/severe chronic C5 radiculopathy on the right.    A mild right median nerve entrapment at the wrist affecting sensory components.  This seems to be asymptomatic.  There is no significant electrodiagnostic evidence of any other focal nerve entrapment or brachial plexopathy.   Recommendations: 1.  Follow-up with referring physician. 2.  Continue current management of symptoms. 3.  Continue use of resting splint at night-time and as needed during the day. 4.  Suggest surgical evaluation.  ___________________________ Wonda Olds Board Certified, American Board of Physical Medicine and Rehabilitation    Nerve Conduction Studies Anti Sensory Summary Table   Stim  Site NR Peak (ms) Norm Peak (ms) P-T Amp (V) Norm P-T Amp Site1 Site2 Delta-P (ms) Dist (cm) Vel (m/s) Norm Vel (m/s)  Right Median Acr Palm Anti Sensory (2nd Digit)  29.5C  Wrist    *4.0 <3.6 13.1 >10        Right Radial Anti Sensory (Base 1st Digit)  29.7C  Wrist    2.3 <3.1 10.6  Wrist Base 1st Digit 2.3 0.0    Right Ulnar Anti Sensory (5th Digit)  30.4C  Wrist    3.7 <3.7 15.5  >15.0 Wrist 5th Digit 3.7 14.0 38 >38   Motor Summary Table   Stim Site NR Onset (ms) Norm Onset (ms) O-P Amp (mV) Norm O-P Amp Site1 Site2 Delta-0 (ms) Dist (cm) Vel (m/s) Norm Vel (m/s)  Right Median Motor (Abd Poll Brev)  29.9C  Wrist    4.0 <4.2 5.1 >5 Elbow Wrist 4.5 23.0 51 >50  Elbow    8.5  4.9         Right Ulnar Motor (Abd Dig Min)  30.1C  Wrist    3.4 <4.2 6.8 >3 B Elbow Wrist 3.7 22.0 59 >53  B Elbow    7.1  7.4  A Elbow B Elbow 1.1 10.0 91 >53  A Elbow    8.2  7.5          EMG   Side Muscle Nerve Root Ins Act Fibs Psw Amp Dur Poly Recrt Int Fraser Din Comment  Right 1stDorInt Ulnar C8-T1 Nml Nml Nml Nml Nml 0 Nml Nml   Right Abd Poll Brev Median C8-T1 Nml Nml Nml Nml Nml 0 Nml Nml   Right ExtDigCom   Nml Nml Nml Nml Nml 0 Nml Nml   Right Triceps Radial C6-7-8 Nml Nml Nml Nml Nml 0 Nml Nml   Right Deltoid Axillary C5-6 *Incr *2+ *2+ Nml Nml 0 *Reduced Nml   Right Biceps Musculocut C5-6 *Incr *2+ *2+ Nml Nml 0 *Reduced Nml     Nerve Conduction Studies Anti Sensory Left/Right Comparison   Stim Site L Lat (ms) R Lat (ms) L-R Lat (ms) L Amp (V) R Amp (V) L-R Amp (%) Site1 Site2 L Vel (m/s) R Vel (m/s) L-R Vel (m/s)  Median Acr Palm Anti Sensory (2nd Digit)  29.5C  Wrist  *4.0   13.1        Radial Anti Sensory (Base 1st Digit)  29.7C  Wrist  2.3   10.6  Wrist Base 1st Digit     Ulnar Anti Sensory (5th Digit)  30.4C  Wrist  3.7   15.5  Wrist 5th Digit  38    Motor Left/Right Comparison   Stim Site L Lat (ms) R Lat (ms) L-R Lat (ms) L Amp (mV) R Amp (mV) L-R Amp (%) Site1 Site2 L Vel (m/s) R Vel (m/s) L-R Vel (m/s)  Median Motor (Abd Poll Brev)  29.9C  Wrist  4.0   5.1  Elbow Wrist  51   Elbow  8.5   4.9        Ulnar Motor (Abd Dig Min)  30.1C  Wrist  3.4   6.8  B Elbow Wrist  59   B Elbow  7.1   7.4  A Elbow B Elbow  91   A Elbow  8.2   7.5           Waveforms:            Clinical History: MRI CERVICAL SPINE WITHOUT CONTRAST  TECHNIQUE: Multiplanar,  multisequence MR imaging of the cervical spine was performed. No intravenous contrast was administered.   COMPARISON:  None.   FINDINGS: Alignment: Normal   Vertebrae: Vertebral body heights are maintained. No specific evidence of acute fracture, discitis/osteomyelitis, or suspicious bone lesion.   Cord: Normal cord signal   Posterior Fossa, vertebral arteries, paraspinal tissues: Remote appearing bilateral cerebellar lacunar infarcts, partially imaged. Bilateral vertebral artery flow voids are maintained.   Disc levels:   Motion limited evaluation.   C2-C3: Bilateral facet and uncovertebral hypertrophy. Mild bilateral foraminal stenosis without significant canal stenosis.   C3-C4: Posterior disc osteophyte complex and bilateral facet and uncovertebral hypertrophy with moderate to severe left greater than right foraminal stenosis. Mild canal stenosis.   C4-C5: Right eccentric posterior disc osteophyte complex with right greater than left facet and uncovertebral hypertrophy. Resulting moderate to severe right foraminal stenosis with narrowing of the right root entry zone. Mild left foraminal stenosis. Mild canal stenosis.   C5-C6: Posterior disc osteophyte complex with bilateral facet and uncovertebral hypertrophy. Moderate right and mild left foraminal stenosis. Mild canal stenosis.   C6-C7: Small posterior disc osteophyte complex and bilateral facet and uncovertebral hypertrophy. Severe bilateral foraminal stenosis and mild to moderate canal stenosis.   C7-T1: No significant disc protrusion, foraminal stenosis, or canal stenosis.   IMPRESSION: 1. Severe bilateral foraminal stenosis at C6-C7. Moderate to severe foraminal stenosis bilaterally at C3-C4 and on the right at C4-C5. Moderate foraminal stenosis on the right at C5-C6. Multilevel mild foraminal stenosis is detailed above. 2. Mild to moderate canal stenosis at C6-C7 and multilevel mild canal stenosis.      Electronically Signed   By: Margaretha Sheffield MD   On: 10/09/2020 08:51     Objective:  VS:  HT:    WT:   BMI:     BP:   HR: bpm  TEMP: ( )  RESP:  Physical Exam Musculoskeletal:        General: No tenderness.     Comments: Inspection reveals no atrophy of the bilateral APB or FDI or hand intrinsics. There is no swelling, color changes, allodynia or dystrophic changes. There is 5 out of 5 strength in the bilateral wrist extension, finger abduction and long finger flexion.  There is 4 out of 5 right biceps flexion compared to left.  There is intact sensation to light touch in all dermatomal and peripheral nerve distributions. There is a negative Hoffmann's test bilaterally.  Skin:    General: Skin is warm and dry.     Findings: No erythema or rash.  Neurological:     General: No focal deficit present.     Mental Status: He is alert and oriented to person, place, and time.     Sensory: No sensory deficit.     Motor: No weakness or abnormal muscle tone.     Coordination: Coordination normal.     Gait: Gait normal.  Psychiatric:        Mood and Affect: Mood normal.        Behavior: Behavior normal.        Thought Content: Thought content normal.     Imaging: No results found.

## 2021-03-25 NOTE — Procedures (Signed)
EMG & NCV Findings: Evaluation of the right median (across palm) sensory nerve showed prolonged distal peak latency (4.0 ms).  All remaining nerves (as indicated in the following tables) were within normal limits.    Needle evaluation of the right deltoid and the right biceps muscles showed increased insertional activity, moderately increased spontaneous activity, and diminished recruitment.  All remaining muscles (as indicated in the following table) showed no evidence of electrical instability.    Impression: The above electrodiagnostic study is ABNORMAL and reveals evidence of:  A moderate/severe chronic C5 radiculopathy on the right.    A mild right median nerve entrapment at the wrist affecting sensory components.  This seems to be asymptomatic.  There is no significant electrodiagnostic evidence of any other focal nerve entrapment or brachial plexopathy.   Recommendations: 1.  Follow-up with referring physician. 2.  Continue current management of symptoms. 3.  Continue use of resting splint at night-time and as needed during the day. 4.  Suggest surgical evaluation.  ___________________________ Laurence Spates FAAPMR Board Certified, American Board of Physical Medicine and Rehabilitation    Nerve Conduction Studies Anti Sensory Summary Table   Stim Site NR Peak (ms) Norm Peak (ms) P-T Amp (V) Norm P-T Amp Site1 Site2 Delta-P (ms) Dist (cm) Vel (m/s) Norm Vel (m/s)  Right Median Acr Palm Anti Sensory (2nd Digit)  29.5C  Wrist    *4.0 <3.6 13.1 >10        Right Radial Anti Sensory (Base 1st Digit)  29.7C  Wrist    2.3 <3.1 10.6  Wrist Base 1st Digit 2.3 0.0    Right Ulnar Anti Sensory (5th Digit)  30.4C  Wrist    3.7 <3.7 15.5 >15.0 Wrist 5th Digit 3.7 14.0 38 >38   Motor Summary Table   Stim Site NR Onset (ms) Norm Onset (ms) O-P Amp (mV) Norm O-P Amp Site1 Site2 Delta-0 (ms) Dist (cm) Vel (m/s) Norm Vel (m/s)  Right Median Motor (Abd Poll Brev)  29.9C  Wrist    4.0  <4.2 5.1 >5 Elbow Wrist 4.5 23.0 51 >50  Elbow    8.5  4.9         Right Ulnar Motor (Abd Dig Min)  30.1C  Wrist    3.4 <4.2 6.8 >3 B Elbow Wrist 3.7 22.0 59 >53  B Elbow    7.1  7.4  A Elbow B Elbow 1.1 10.0 91 >53  A Elbow    8.2  7.5          EMG   Side Muscle Nerve Root Ins Act Fibs Psw Amp Dur Poly Recrt Int Fraser Din Comment  Right 1stDorInt Ulnar C8-T1 Nml Nml Nml Nml Nml 0 Nml Nml   Right Abd Poll Brev Median C8-T1 Nml Nml Nml Nml Nml 0 Nml Nml   Right ExtDigCom   Nml Nml Nml Nml Nml 0 Nml Nml   Right Triceps Radial C6-7-8 Nml Nml Nml Nml Nml 0 Nml Nml   Right Deltoid Axillary C5-6 *Incr *2+ *2+ Nml Nml 0 *Reduced Nml   Right Biceps Musculocut C5-6 *Incr *2+ *2+ Nml Nml 0 *Reduced Nml     Nerve Conduction Studies Anti Sensory Left/Right Comparison   Stim Site L Lat (ms) R Lat (ms) L-R Lat (ms) L Amp (V) R Amp (V) L-R Amp (%) Site1 Site2 L Vel (m/s) R Vel (m/s) L-R Vel (m/s)  Median Acr Palm Anti Sensory (2nd Digit)  29.5C  Wrist  *4.0   13.1  Radial Anti Sensory (Base 1st Digit)  29.7C  Wrist  2.3   10.6  Wrist Base 1st Digit     Ulnar Anti Sensory (5th Digit)  30.4C  Wrist  3.7   15.5  Wrist 5th Digit  38    Motor Left/Right Comparison   Stim Site L Lat (ms) R Lat (ms) L-R Lat (ms) L Amp (mV) R Amp (mV) L-R Amp (%) Site1 Site2 L Vel (m/s) R Vel (m/s) L-R Vel (m/s)  Median Motor (Abd Poll Brev)  29.9C  Wrist  4.0   5.1  Elbow Wrist  51   Elbow  8.5   4.9        Ulnar Motor (Abd Dig Min)  30.1C  Wrist  3.4   6.8  B Elbow Wrist  59   B Elbow  7.1   7.4  A Elbow B Elbow  91   A Elbow  8.2   7.5           Waveforms:

## 2021-03-26 ENCOUNTER — Ambulatory Visit: Payer: PPO | Admitting: Cardiology

## 2021-03-26 ENCOUNTER — Telehealth: Payer: Self-pay | Admitting: Physical Medicine and Rehabilitation

## 2021-03-26 NOTE — Telephone Encounter (Signed)
NCV/EMG report copy printed for patient to p/u.

## 2021-04-11 ENCOUNTER — Other Ambulatory Visit: Payer: Self-pay

## 2021-04-11 ENCOUNTER — Other Ambulatory Visit (HOSPITAL_COMMUNITY)
Admission: RE | Admit: 2021-04-11 | Discharge: 2021-04-11 | Disposition: A | Discharge status: Home / Self Care | Payer: PPO | Source: Ambulatory Visit | Attending: Family Medicine | Admitting: Family Medicine

## 2021-04-11 ENCOUNTER — Telehealth: Payer: Self-pay | Admitting: Registered Nurse

## 2021-04-11 ENCOUNTER — Encounter: Payer: Self-pay | Admitting: Family Medicine

## 2021-04-11 ENCOUNTER — Ambulatory Visit (INDEPENDENT_AMBULATORY_CARE_PROVIDER_SITE_OTHER): Payer: PPO | Admitting: Family Medicine

## 2021-04-11 VITALS — BP 120/67 | HR 64 | Temp 98.0°F | Resp 18 | Ht 68.0 in | Wt 164.0 lb

## 2021-04-11 DIAGNOSIS — N485 Ulcer of penis: Secondary | ICD-10-CM

## 2021-04-11 NOTE — Patient Instructions (Addendum)
Keep area clean with soap and water. Can cover with vaseline if needed. Avoid sexual contact with area (abstinence or strict condom use) until completely healed. Follow up if not continuing to improve.  I will check some labs for sexually transmitted infections including herpes, syphilis, chlamydia, gonorrhea, HIV.  I also performed a swab to look for actual herpes virus but that test can be sometimes difficult if there is not any discharge. If all tests are normal today, I do recommend repeating the herpes virus and syphilis test in 6 weeks to make sure those are still negative. Let us know if there are questions and take care.

## 2021-04-11 NOTE — Telephone Encounter (Signed)
Patient called back and has questions about his appointment and would like for someone to call him back.

## 2021-04-11 NOTE — Progress Notes (Signed)
Subjective:  Patient ID: Paul Ray, male    DOB: 02-07-1956  Age: 65 y.o. MRN: [id number]  CC:  Chief Complaint  Patient presents with   bump on penis    HPI Paul Ray presents for   Penile Lesion: Noticed a pimple or cyst initially 3 days ago. Cleansing with alcohol swabs, opened up yesterday, no bleeding or discharge.  No hx of HSV, syphilis, or other STI previously. No recent testing.  Divorced. Last 5.5 months - new partner past 5.5 months. Unprotected intercourse.no known STI's in partner.  No dysuria, no penile discharge.  History Patient Active Problem List   Diagnosis Date Noted   Foraminal stenosis of cervical region 03/08/2021   Viral pericarditis    Alcohol use disorder, severe, dependence ([place]) 12/06/2020   Chest pain 11/09/2020   Ascending aortic aneurysm (HCC)    Benign essential HTN    Coronary artery calcification seen on CAT scan    Psychoactive substance-induced psychosis ([place])    Rib pain 07/01/2017   Closed fracture of one rib of left side 07/01/2017   Chronic right shoulder pain 05/04/2017   Osteoarthritis of right hip 12/02/2016   Lumbar radiculopathy 04/17/2014   Chronic low back pain 08/16/2013   Lumbar post-laminectomy syndrome 08/16/2013   Lumbosacral radiculitis 08/16/2013   Impingement syndrome of right shoulder 07/05/2013   Empyema lung (HCC)    Abscess of lung(513.0)    Bipolar depression ([place])    ADD (attention deficit disorder)    Hepatitis C    Hip pain 05/02/2011   History of total hip arthroplasty 04/23/2011   Lung mass 10/22/2010   Pleural effusion 10/15/2010   Past Medical History:  Diagnosis Date   Abscess of lung(513.0)    Right lower lobe   ADD (attention deficit disorder)    Anxiety    Arthritis    Ascending aortic aneurysm (HCC)    71m by Chest CTA 05/2020 and 463mby echo 05/2020   Benign essential HTN    [place]   Coronary artery calcification seen on CAT scan    [place]    Hemorrhoids    Hepatitis C    Pneumonia    last year   Prostate abscess    Viral pericarditis    after COVID 19 infection   Past Surgical History:  Procedure Laterality Date   ELBOW SURGERY  2005   Right   FOOT NEUROMA SURGERY  2006   Left   HIP RESURFACING     [facility]   LUMBAR LAMINECTOMY/DECOMPRESSION MICRODISCECTOMY  07/24/2011   Procedure: LUMBAR LAMINECTOMY/DECOMPRESSION MICRODISCECTOMY;  Surgeon: DaEustace Moore [facility];  Service: Neurosurgery;  Laterality: Bilateral;  Bilateral  , Lumbar Three-Four, Lumbar Four-Five Decompressive Laminectomy Rm # 32   LUNG SURGERY     for empyema   SHOULDER ARTHROSCOPY Right 07/05/2013   Procedure: RIGHT SHOULDER ARTHROSCOPY WITH DEBRIDEMENT;  Surgeon: ChMcarthur RossettiMD;  [facility];  Laterality: Right;   TENDON TRANSFER Left 01/26/2014   Procedure: LEFT THUMB TRAPEZIECTOMY KNOTTED TENDON INTRAPOSITION TRANSFER OF ABDUCTOR POLLICIS LONGUS TO [facility] Surgeon: RoCammie SickleMD;  [facility] Service: [facility]ics;  Laterality: Left;   THORACOTOMY  November 14 2010   rt   Allergies  Allergen Reactions   Quinolones Other (See Comments)    Patient was warned about not using Cipro and similar antibiotics. Recent  studies have raised concern that fluoroquinolone antibiotics could be associated with an increased risk of aortic aneurysm Fluoroquinolones have non-antimicrobial properties that might jeopardise the integrity of the extracellular matrix of the vascular wall In a  propensity score matched cohort study in [place], there was a 66% increased rate of aortic aneurysm or dissection associated with oral fluoroquinolone use, compared wit   Prior to Admission medications   Medication Sig Start Date End Date Taking? Authorizing Provider  alprazolam Duanne Moron) 2 MG tablet Take 1 tablet (2 mg total) by mouth 2 (two) times daily. 03/22/21  Yes Wendie Agreste, MD  atorvastatin (LIPITOR) 20 MG tablet Take 1 tablet (20 mg total) by mouth daily. 02/26/21  Yes Maximiano Coss, NP  lamoTRIgine (LAMICTAL) 200 MG tablet Take 1 tablet (200 mg total) by mouth in the morning. 02/26/21  Yes Maximiano Coss, NP  sildenafil (VIAGRA) 50 MG tablet Take 50 mg by mouth daily as needed.   Yes [provider]  diclofenac (VOLTAREN) 50 MG EC tablet Take 1 tablet (50 mg total) by mouth 2 (two) times daily. Patient not taking: Reported on 04/11/2021 02/26/21   Maximiano Coss, NP  diclofenac Sodium (VOLTAREN) 1 % GEL Apply 2 g topically 4 (four) times daily. Patient not taking: Reported on 04/11/2021 02/26/21   Maximiano Coss, NP   Social History   Socioeconomic History   Marital status: Single    Spouse name: Not on file   Number of children: 2   Years of education: Not on file   Highest education level: Not on file  Occupational History   Occupation: carpenter  Tobacco Use   Smoking status: Never   Smokeless tobacco: Never  Vaping Use   Vaping Use: Never used  Substance and Sexual Activity   Alcohol use: No    Comment: quit drinking in 1989   Drug use: No   Sexual activity: Not on file  Other Topics Concern   Not on file  Social History Narrative   2 children: [name] and [name]   Social Determinants of Health   Financial Resource Strain: Not on file  Food Insecurity: Not on file  Transportation Needs: Not on file  Physical Activity: Not on file  Stress: Not on file  Social Connections: Not on file  Intimate Partner Violence: Not on file    Review of Systems Per HPI.   Objective:   Vitals:   04/11/21 1256  BP: 120/67  Pulse: 64  Resp: 18  Temp: 98 F (36.7 C)  TempSrc: Temporal  SpO2: 99%  Weight: 164 lb (74.4 kg)  Height: '5\' 8"'$  (1.727 m)     Physical Exam Vitals reviewed.  Constitutional:      General: He is not in acute distress.    Appearance: Normal appearance. He is well-developed.  HENT:     Head: Normocephalic and  atraumatic.  Cardiovascular:     Rate and Rhythm: Normal rate.  Pulmonary:     Effort: Pulmonary effort is normal.  Genitourinary:    Penis: Lesions present. No erythema, tenderness, discharge or swelling.      Testes: Normal.    Neurological:     Mental Status: He is alert and oriented to person, place, and time.  Psychiatric:        Mood and Affect: Mood normal.       Assessment & Plan:  Paul Ray is a 64 y.o. male . Penile ulcer - Plan: Herpes simplex virus culture, HSV(herpes simplex vrs) 1+2  ab-IgG, RPR, HIV Antibody (routine testing w rflx), Urine cytology ancillary only  - painless ulcer, Possible ruptured initial papule with cleansing as above.  Single lesion.  No history of HSV, syphilis.  New partner past 5-1/2 months but no known STIs.  Differential includes syphilis, herpetic lesion.  Check herpes virus culture, although no liquid or discharge for swab.  HSV titer, RPR, HIV, chlamydia and gonorrhea testing as above. If negative, consider repeat titers in 6 weeks. Contact precautions with abstinence or strict condom use, wound care precautions and rtc precautions. Understanding expressed.   No orders of the defined types were placed in this encounter.  Patient Instructions  Keep area clean with soap and water. Can cover with vaseline if needed. Avoid sexual contact with area (abstinence or strict condom use) until completely healed. Follow up if not continuing to improve.  I will check some labs for sexually transmitted infections including herpes, syphilis, chlamydia, gonorrhea, HIV.  I also performed a swab to look for actual herpes virus but that test can be sometimes difficult if there is not any discharge. If all tests are normal today, I do recommend repeating the herpes virus and syphilis test in 6 weeks to make sure those are still negative. Let us know if there are questions and take care.       Signed,   Merri Ray, MD [place] 04/11/21 1:42 PM

## 2021-04-12 LAB — HERPES SIMPLEX VIRUS CULTURE

## 2021-04-12 LAB — HIV ANTIBODY (ROUTINE TESTING W REFLEX): HIV 1&2 Ab, 4th Generation: NONREACTIVE

## 2021-04-12 LAB — URINE CYTOLOGY ANCILLARY ONLY
Chlamydia: NEGATIVE
Comment: NEGATIVE
Comment: NEGATIVE
Comment: NORMAL
Neisseria Gonorrhea: NEGATIVE
Trichomonas: NEGATIVE

## 2021-04-12 LAB — HSV(HERPES SIMPLEX VRS) I + II AB-IGG
HSV 1 IGG,TYPE SPECIFIC AB: <0.9 {index}
HSV 2 IGG,TYPE SPECIFIC AB: <0.9 {index}

## 2021-04-12 LAB — TIQ-NTM

## 2021-04-12 LAB — SYPHILIS: RPR W/REFLEX TO RPR TITER AND TREPONEMAL ANTIBODIES, TRADITIONAL SCREENING AND DIAGNOSIS ALGORITHM: RPR Ser Ql: NONREACTIVE

## 2021-04-12 NOTE — Telephone Encounter (Signed)
Patient had an appointment yesterday was seen in office.

## 2021-04-15 ENCOUNTER — Ambulatory Visit: Payer: PPO | Admitting: Family Medicine

## 2021-04-15 ENCOUNTER — Telehealth: Payer: Self-pay | Admitting: Registered Nurse

## 2021-04-15 NOTE — Telephone Encounter (Signed)
Would like results of his blood work & urinalysis - Questions about medication also -

## 2021-04-15 NOTE — Telephone Encounter (Signed)
Called and spoke with patient about lab results. Patient voiced understanding. Patient also wanted to know if he can be prescribed something for his ADD. He stated that he feels amazing but his focus is not there. Let me know how you would like to proceed.

## 2021-04-16 NOTE — Telephone Encounter (Signed)
Pt would like for Richard to call him regarding his ADHD Medication he is calling and wanting a follow up

## 2021-04-16 NOTE — Telephone Encounter (Signed)
Pt has does not currently have any related prescriptions he would need to come in to have an assessment and discuss starting medications

## 2021-04-17 ENCOUNTER — Ambulatory Visit: Payer: PPO | Admitting: Surgery

## 2021-04-17 NOTE — Telephone Encounter (Signed)
Made appt for 11:50 on 09/22

## 2021-04-18 ENCOUNTER — Telehealth: Payer: Self-pay

## 2021-04-18 NOTE — Telephone Encounter (Signed)
Error

## 2021-04-19 ENCOUNTER — Other Ambulatory Visit: Payer: Self-pay | Admitting: Family Medicine

## 2021-04-19 ENCOUNTER — Telehealth: Payer: Self-pay

## 2021-04-19 DIAGNOSIS — G479 Sleep disorder, unspecified: Secondary | ICD-10-CM

## 2021-04-19 NOTE — Telephone Encounter (Signed)
Pt is calling back about referral ot provider about going back on ADHD meds and Delfino Lovett said he was going to do a referral. Can someone please call him.  Pt call back [phone]

## 2021-04-19 NOTE — Telephone Encounter (Signed)
Requesting:Xanax 2mg  Contract: UDS: Last Visit:04/11/21 Next Visit:n/a Last Refill:03/22/21 60 tabs 0 refills  Please Advise

## 2021-04-19 NOTE — Telephone Encounter (Signed)
I see in the note where you noted  to consider a consultation with psychiatry after the pt went through with the hypnosis and that getting back in to see Dr. Toy Care may be beneficial- so should the referral go to a different office?

## 2021-04-22 ENCOUNTER — Telehealth: Payer: Self-pay

## 2021-04-22 ENCOUNTER — Other Ambulatory Visit: Payer: Self-pay

## 2021-04-22 ENCOUNTER — Other Ambulatory Visit: Payer: Self-pay | Admitting: Registered Nurse

## 2021-04-22 ENCOUNTER — Ambulatory Visit: Payer: PPO | Admitting: Cardiology

## 2021-04-22 DIAGNOSIS — G479 Sleep disorder, unspecified: Secondary | ICD-10-CM

## 2021-04-22 DIAGNOSIS — F319 Bipolar disorder, unspecified: Secondary | ICD-10-CM

## 2021-04-22 MED ORDER — ALPRAZOLAM 2 MG PO TABS: 2.0000 mg | ORAL_TABLET | Freq: Two times a day (BID) | ORAL | 0 refills | Status: DC | PRN

## 2021-04-22 NOTE — Telephone Encounter (Signed)
Rx was filled on 04/19/21 but patient is wanting a 90 day supply so he does not have to get it refilled so often. Please advise

## 2021-04-22 NOTE — Telephone Encounter (Signed)
Will defer to his psychiatry team for ADHD medication.  Thank you  Rich

## 2021-04-22 NOTE — Telephone Encounter (Signed)
Dr. Toy Care is ok if patient is fine with that  Thank you  Rich

## 2021-04-22 NOTE — Telephone Encounter (Signed)
Referral placed.

## 2021-04-22 NOTE — Telephone Encounter (Signed)
.   PATIENT IS REQUESTING A 90 DAY SUPPLY OR REFILLS on MEDICATION So he does not have to call in every 30 days to get a refill  Encourage patient to contact the pharmacy for refills or they can request refills through [place]:  Please schedule appointment if longer than 1 year  NEXT APPOINTMENT DATE:  MEDICATION:alprazolam (XANAX) 2 MG tablet  Is the patient out of medication?   PHARMACY:[id number] - [place], [place]  Let patient know to contact pharmacy at the end of the day to make sure medication is ready.  Please notify patient to allow 48-72 hours to process   Please advise

## 2021-04-23 ENCOUNTER — Ambulatory Visit (INDEPENDENT_AMBULATORY_CARE_PROVIDER_SITE_OTHER): Payer: PPO | Admitting: Orthopaedic Surgery

## 2021-04-23 VITALS — BP 120/65 | HR 65 | Ht 68.0 in | Wt 165.0 lb

## 2021-04-23 DIAGNOSIS — M4802 Spinal stenosis, cervical region: Secondary | ICD-10-CM

## 2021-04-23 DIAGNOSIS — N5201 Erectile dysfunction due to arterial insufficiency: Secondary | ICD-10-CM | POA: Diagnosis not present

## 2021-04-23 MED ORDER — GABAPENTIN 100 MG PO CAPS: 100.0000 mg | ORAL_CAPSULE | Freq: Two times a day (BID) | ORAL | 1 refills | Status: DC

## 2021-04-23 NOTE — Progress Notes (Signed)
Office Visit Note   Patient: Paul Ray           Date of Birth: [birth year]           MRN: [id number] Visit Date: 04/23/2021              Requested by: Maximiano Coss, NP [address] [place],  [place] [address] PCP: Maximiano Coss, NP   Assessment & Plan: Visit Diagnoses:  1. Foraminal stenosis of cervical region     Plan: We will place patient on some Neurontin.  He has multilevel changes with on the right side most significant compression at the C4-5 level with disc protrusion.  We will try some low-dose Neurontin 100 mg at night for 7 days then 1 p.o. twice daily.  Recheck 3 months.  We discussed with him that shoulder arthroplasty might be his first choice if he gets some relief with the Neurontin.  If not then we would discuss cervical fusion at the C45 and C5-6 level.  Follow-Up Instructions: Return in about 3 months (around 07/23/2021).   Orders:  No orders of the defined types were placed in this encounter.  Meds ordered this encounter  Medications   gabapentin (NEURONTIN) 100 MG capsule    Sig: Take 1 capsule (100 mg total) by mouth 2 (two) times daily.    Dispense:  60 capsule    Refill:  1      Procedures: No procedures performed   Clinical Data: No additional findings.   Subjective: Chief Complaint  Patient presents with   Neck - Follow-up    EMG/NCS review    HPI 65 year old male returns with ongoing problems with shoulder osteoarthritis on the right pain in the right shoulder region.  He does not have any pain or numbness into his hand.  He has multilevel cervical spondylosis with foraminal narrowing at several levels well demonstrated on 10/09/2020 cervical MRI scan.  Electrical test of been done which shows moderate to severe chronic C5 radiculopathy on the right.  He also has mild right carpal tunnel syndrome.  Hand does not bother him at night does not wake him up he is not dropping objects does not have any numbness in his fingers.  Patient  likes to ride his bike.  Patient is history of bipolar disorder, lung abscess with empyema.  Review of Systems all other systems updated unchanged.  Past history of thoracotomy with intermittent problems with sharp right rib pain.   Objective: Vital Signs: BP 120/65   Pulse 65   Ht 5\' 8"  (1.727 m)   Wt 165 lb (74.8 kg)   BMI 25.09 kg/m   Physical Exam Constitutional:      Appearance: He is well-developed.  HENT:     Head: Normocephalic and atraumatic.     Right Ear: External ear normal.     Left Ear: External ear normal.  Eyes:     Pupils: Pupils are equal, round, and reactive to light.  Neck:     Thyroid: No thyromegaly.     Trachea: No tracheal deviation.  Cardiovascular:     Rate and Rhythm: Normal rate.  Pulmonary:     Effort: Pulmonary effort is normal.     Breath sounds: No wheezing.  Abdominal:     General: Bowel sounds are normal.     Palpations: Abdomen is soft.  Musculoskeletal:     Cervical back: Neck supple.  Skin:    General: Skin is warm and dry.  Capillary Refill: Capillary refill takes less than 2 seconds.  Neurological:     Mental Status: He is alert and oriented to person, place, and time.  Psychiatric:        Behavior: Behavior normal.        Thought Content: Thought content normal.        Judgment: Judgment normal.    Ortho Exam no thenar atrophy mild brachial plexus tenderness on the right.  Normal heel toe gait no myelopathic changes.  Pain with extremes of shoulder range of motion on the right consistent with a shoulder OA.  Specialty Comments:    Impression: The above electrodiagnostic study is ABNORMAL and reveals evidence of:   A moderate/severe chronic C5 radiculopathy on the right.     A mild right median nerve entrapment at the wrist affecting sensory components.  This seems to be asymptomatic.   There is no significant electrodiagnostic evidence of any other focal nerve entrapment or brachial plexopathy.     Recommendations: 1.  Follow-up with referring physician. 2.  Continue current management of symptoms. 3.  Continue use of resting splint at night-time and as needed during the day. 4.  Suggest surgical evaluation.   ___________________________ [employer] [employer] Board Certified, American Board of Physical Medicine and Rehabilitation      Imaging: No results found.   PMFS History: Patient Active Problem List   Diagnosis Date Noted   Foraminal stenosis of cervical region 03/08/2021   Viral pericarditis    Alcohol use disorder, severe, dependence ([place]) 12/06/2020   Chest pain 11/09/2020   Ascending aortic aneurysm (HCC)    Benign essential HTN    Coronary artery calcification seen on CAT scan    Psychoactive substance-induced psychosis ([place])    Rib pain 07/01/2017   Closed fracture of one rib of left side 07/01/2017   Chronic right shoulder pain 05/04/2017   Osteoarthritis of right hip 12/02/2016   Lumbar radiculopathy 04/17/2014   Chronic low back pain 08/16/2013   Lumbar post-laminectomy syndrome 08/16/2013   Lumbosacral radiculitis 08/16/2013   Impingement syndrome of right shoulder 07/05/2013   Empyema lung (HCC)    Abscess of lung(513.0)    Bipolar depression ([place])    ADD (attention deficit disorder)    Hepatitis C    Hip pain 05/02/2011   History of total hip arthroplasty 04/23/2011   Lung mass 10/22/2010   Pleural effusion 10/15/2010   Past Medical History:  Diagnosis Date   Abscess of lung(513.0)    Right lower lobe   ADD (attention deficit disorder)    Anxiety    Arthritis    Ascending aortic aneurysm (HCC)    10mm by Chest CTA 05/2020 and 63mm by echo 05/2020   Benign essential HTN    Bipolar depression ([place])    Coronary artery calcification seen on CAT scan    Empyema lung ([place])    Hemorrhoids    Hepatitis C    Pneumonia    last year   Prostate abscess    Viral pericarditis    after COVID 19 infection    Family History  Problem Relation  Age of Onset   COPD Mother    Heart disease Mother    Hypertension Mother    Coronary artery disease Father    Dementia Father    Heart failure Father    Prostate cancer Paternal Grandfather        also had bone cancer   Breast cancer Maternal Grandmother    Cancer  Paternal Grandmother        unsure what kind    Past Surgical History:  Procedure Laterality Date   ELBOW SURGERY  2005   Right   FOOT NEUROMA SURGERY  2006   Left   HIP RESURFACING     [facility] hip [facility]2011   LUMBAR LAMINECTOMY/DECOMPRESSION MICRODISCECTOMY  07/24/2011   Procedure: LUMBAR LAMINECTOMY/DECOMPRESSION MICRODISCECTOMY;  Surgeon: Eustace Moore;  Location: [place] [facility];  Service: Neurosurgery;  Laterality: Bilateral;  Bilateral  , Lumbar Three-Four, Lumbar Four-Five Decompressive Laminectomy Rm # 32   LUNG SURGERY     for empyema   SHOULDER ARTHROSCOPY Right 07/05/2013   Procedure: RIGHT SHOULDER ARTHROSCOPY WITH DEBRIDEMENT;  Surgeon: Mcarthur Rossetti, MD;  Location: [place];  Service: Orthopedics;  Laterality: Right;   TENDON TRANSFER Left 01/26/2014   Procedure: LEFT THUMB TRAPEZIECTOMY KNOTTED TENDON INTRAPOSITION TRANSFER OF ABDUCTOR POLLICIS LONGUS TO [place];  Surgeon: Cammie Sickle, MD;  Location: [place];  Service: Orthopedics;  Laterality: Left;   THORACOTOMY  November 14 2010   rt   Social History   Occupational History   Occupation: Games developer  Tobacco Use   Smoking status: Never   Smokeless tobacco: Never  Vaping Use   Vaping Use: Never used  Substance and Sexual Activity   Alcohol use: No    Comment: quit drinking in 1989   Drug use: No   Sexual activity: Not on file

## 2021-04-25 ENCOUNTER — Other Ambulatory Visit: Payer: Self-pay | Admitting: Registered Nurse

## 2021-04-25 DIAGNOSIS — F319 Bipolar disorder, unspecified: Secondary | ICD-10-CM

## 2021-04-25 DIAGNOSIS — F988 Other specified behavioral and emotional disorders with onset usually occurring in childhood and adolescence: Secondary | ICD-10-CM

## 2021-04-25 DIAGNOSIS — G479 Sleep disorder, unspecified: Secondary | ICD-10-CM

## 2021-04-25 NOTE — Telephone Encounter (Signed)
Have replaced referral. CC Erica on this message to let her know it is not duplicate referral  Thank you  Denice Paradise

## 2021-04-25 NOTE — Telephone Encounter (Signed)
Referral has been sent to [facility]

## 2021-04-25 NOTE — Telephone Encounter (Signed)
Its been over a year since he seen psychiatry Dr Toy Care. He would be consider as a new patient and with his insurance they no longer cover these providers can you give direction as to who he will be going to so he don't pay out of pocket. Pt has [employer]?

## 2021-04-25 NOTE — Telephone Encounter (Signed)
Showing 90 days supplies given for all meds  Thanks  Rich

## 2021-04-25 NOTE — Telephone Encounter (Signed)
Spoke wit patient about med supply. Pt understood. No concerns at this time.

## 2021-05-21 ENCOUNTER — Ambulatory Visit (INDEPENDENT_AMBULATORY_CARE_PROVIDER_SITE_OTHER): Payer: PPO | Admitting: Registered Nurse

## 2021-05-21 ENCOUNTER — Encounter: Payer: Self-pay | Admitting: Registered Nurse

## 2021-05-21 ENCOUNTER — Other Ambulatory Visit: Payer: Self-pay

## 2021-05-21 VITALS — BP 146/67 | HR 61 | Temp 98.2°F | Resp 18 | Ht 68.0 in | Wt 163.8 lb

## 2021-05-21 DIAGNOSIS — S61432A Puncture wound without foreign body of left hand, initial encounter: Secondary | ICD-10-CM | POA: Diagnosis not present

## 2021-05-21 DIAGNOSIS — R41840 Attention and concentration deficit: Secondary | ICD-10-CM | POA: Diagnosis not present

## 2021-05-21 MED ORDER — SULFAMETHOXAZOLE-TRIMETHOPRIM 800-160 MG PO TABS: 1.0000 | ORAL_TABLET | Freq: Two times a day (BID) | ORAL | 0 refills | Status: DC

## 2021-05-21 NOTE — Progress Notes (Signed)
Established Patient Office Visit  Subjective:  Patient ID: Paul Ray, male    DOB: 03-15-1956  Age: 65 y.o. MRN: [id number]  CC:  Chief Complaint  Patient presents with   Hand Injury    Patient states he stuck some wire in his thumb on Saturday. Patient states his had was bleeding and still swollen at this moment.also would like to discuss his referral.    HPI Paul Ray presents for hand injury  Had wire stuck into his thumb on Saturday. Some bleeding, now still swollen and red. Up to date on Tdap, last given 02/11/2018. Had used mupirocin topically twice a day since. Good effect.  Improved overall but he is concerned about developing a deep space infection.  Otherwise notes trouble concentrating Ongoing. Has been adderall in the past, limited effect Would like to see speicalist  Past Medical History:  Diagnosis Date   Abscess of lung(513.0)    Right lower lobe   ADD (attention deficit disorder)    Anxiety    Arthritis    Ascending aortic aneurysm    84mm by Chest CTA 05/2020 and 26mm by echo 05/2020   Benign essential HTN    Bipolar depression ([place])    Coronary artery calcification seen on CAT scan    Empyema lung ([place])    Hemorrhoids    Hepatitis C    Pneumonia    last year   Prostate abscess    Viral pericarditis    after COVID 19 infection    Past Surgical History:  Procedure Laterality Date   ELBOW SURGERY  2005   Right   FOOT NEUROMA SURGERY  2006   Left   HIP RESURFACING     left hip at [facility] 01/2011   LUMBAR LAMINECTOMY/DECOMPRESSION MICRODISCECTOMY  07/24/2011   Procedure: LUMBAR LAMINECTOMY/DECOMPRESSION MICRODISCECTOMY;  Surgeon: Eustace Moore;  Location: [place] [facility];  Service: Neurosurgery;  Laterality: Bilateral;  Bilateral  , Lumbar Three-Four, Lumbar Four-Five Decompressive Laminectomy Rm # 32   LUNG SURGERY     for empyema   SHOULDER ARTHROSCOPY Right 07/05/2013   Procedure: RIGHT SHOULDER ARTHROSCOPY WITH DEBRIDEMENT;  Surgeon:  Mcarthur Rossetti, MD;  Location: [place];  Service: Orthopedics;  Laterality: Right;   TENDON TRANSFER Left 01/26/2014   Procedure: LEFT THUMB TRAPEZIECTOMY KNOTTED TENDON INTRAPOSITION TRANSFER OF ABDUCTOR POLLICIS LONGUS TO [place];  Surgeon: Cammie Sickle, MD;  Location: [place];  Service: Orthopedics;  Laterality: Left;   THORACOTOMY  November 14 2010   rt    Family History  Problem Relation Age of Onset   COPD Mother    Heart disease Mother    Hypertension Mother    Coronary artery disease Father    Dementia Father    Heart failure Father    Prostate cancer Paternal Grandfather        also had bone cancer   Breast cancer Maternal Grandmother    Cancer Paternal Grandmother        unsure what kind    Social History   Socioeconomic History   Marital status: Single    Spouse name: Not on file   Number of children: 2   Years of education: Not on file   Highest education level: Not on file  Occupational History   Occupation: carpenter  Tobacco Use   Smoking status: Never   Smokeless tobacco: Never  Vaping Use   Vaping Use: Never used  Substance and Sexual Activity   Alcohol  use: No    Comment: quit drinking in 1989   Drug use: No   Sexual activity: Not on file  Other Topics Concern   Not on file  Social History Narrative   2 children: Paul Ray and Paul Ray   Social Determinants of Health   Financial Resource Strain: Not on file  Food Insecurity: Not on file  Transportation Needs: Not on file  Physical Activity: Not on file  Stress: Not on file  Social Connections: Not on file  Intimate Partner Violence: Not on file    Outpatient Medications Prior to Visit  Medication Sig Dispense Refill   alprazolam (XANAX) 2 MG tablet Take 1 tablet (2 mg total) by mouth 2 (two) times daily as needed for sleep. 180 tablet 0   atorvastatin (LIPITOR) 20 MG tablet Take 1 tablet (20 mg total) by mouth daily. 90 tablet 3   sildenafil (VIAGRA) 50 MG tablet Take  50 mg by mouth daily as needed.     diclofenac (VOLTAREN) 50 MG EC tablet Take 1 tablet (50 mg total) by mouth 2 (two) times daily. (Patient not taking: No sig reported) 30 tablet 1   diclofenac Sodium (VOLTAREN) 1 % GEL Apply 2 g topically 4 (four) times daily. (Patient not taking: No sig reported) 100 g 1   gabapentin (NEURONTIN) 100 MG capsule Take 1 capsule (100 mg total) by mouth 2 (two) times daily. (Patient not taking: Reported on 05/21/2021) 60 capsule 1   lamoTRIgine (LAMICTAL) 200 MG tablet Take 1 tablet (200 mg total) by mouth in the morning. 90 tablet 0   No facility-administered medications prior to visit.    Allergies  Allergen Reactions   Quinolones Other (See Comments)    Patient was warned about not using Cipro and similar antibiotics. Recent studies have raised concern that fluoroquinolone antibiotics could be associated with an increased risk of aortic aneurysm Fluoroquinolones have non-antimicrobial properties that might jeopardise the integrity of the extracellular matrix of the vascular wall In a  propensity score matched cohort study in [place], there was a 66% increased rate of aortic aneurysm or dissection associated with oral fluoroquinolone use, compared wit    ROS Review of Systems  Constitutional: Negative.   HENT: Negative.    Eyes: Negative.   Respiratory: Negative.    Cardiovascular: Negative.   Gastrointestinal: Negative.   Genitourinary: Negative.   Musculoskeletal: Negative.   Skin: Negative.   Neurological: Negative.   Psychiatric/Behavioral: Negative.    All other systems reviewed and are negative.    Objective:    Physical Exam Constitutional:      General: He is not in acute distress.    Appearance: Normal appearance. He is normal weight. He is not ill-appearing, toxic-appearing or diaphoretic.  Cardiovascular:     Rate and Rhythm: Normal rate and regular rhythm.     Heart sounds: Normal heart sounds. No murmur heard.   No friction rub.  No gallop.  Pulmonary:     Effort: Pulmonary effort is normal. No respiratory distress.     Breath sounds: Normal breath sounds. No stridor. No wheezing, rhonchi or rales.  Chest:     Chest wall: No tenderness.  Skin:    Findings: Lesion (puncture wound in ventral L thumb.) present.  Neurological:     General: No focal deficit present.     Mental Status: He is alert and oriented to person, place, and time. Mental status is at baseline.  Psychiatric:        Mood and  Affect: Mood normal.        Behavior: Behavior normal.        Thought Content: Thought content normal.        Judgment: Judgment normal.    BP (!) 146/67   Pulse 61   Temp 98.2 F (36.8 C) (Temporal)   Resp 18   Ht 5\' 8"  (1.727 m)   Wt 163 lb 12.8 oz (74.3 kg)   SpO2 99%   BMI 24.91 kg/m  Wt Readings from Last 3 Encounters:  05/21/21 163 lb 12.8 oz (74.3 kg)  04/23/21 165 lb (74.8 kg)  04/11/21 164 lb (74.4 kg)     Health Maintenance Due  Topic Date Due   COVID-19 Vaccine (1) Never done   INFLUENZA VACCINE  Never done    There are no preventive care reminders to display for this patient.  No results found for: TSH Lab Results  Component Value Date   WBC 5.6 11/11/2020   HGB 13.9 11/11/2020   HCT 42.0 11/11/2020   MCV 89.4 11/11/2020   PLT 157 11/11/2020   Lab Results  Component Value Date   NA 136 11/11/2020   K 3.8 11/11/2020   CO2 29 11/11/2020   GLUCOSE 97 11/11/2020   BUN 14 11/11/2020   CREATININE 0.71 11/11/2020   BILITOT 0.9 08/20/2020   ALKPHOS 85 08/20/2020   AST 32 08/20/2020   ALT 15 08/20/2020   PROT 7.2 08/20/2020   ALBUMIN 3.4 (L) 08/20/2020   CALCIUM 8.6 (L) 11/11/2020   ANIONGAP 6 11/11/2020   Lab Results  Component Value Date   CHOL 97 11/10/2020   Lab Results  Component Value Date   HDL 52 11/10/2020   Lab Results  Component Value Date   LDLCALC 37 11/10/2020   Lab Results  Component Value Date   TRIG 40 11/10/2020   Lab Results  Component Value Date    CHOLHDL 1.9 11/10/2020   No results found for: HGBA1C    Assessment & Plan:   Problem List Items Addressed This Visit   None Visit Diagnoses     Puncture wound of left hand without foreign body, initial encounter    -  Primary   Relevant Medications   sulfamethoxazole-trimethoprim (BACTRIM DS) 800-160 MG tablet   Concentration deficit           Meds ordered this encounter  Medications   sulfamethoxazole-trimethoprim (BACTRIM DS) 800-160 MG tablet    Sig: Take 1 tablet by mouth 2 (two) times daily.    Dispense:  10 tablet    Refill:  0    Order Specific Question:   Supervising Provider    Answer:   Carlota Raspberry, JEFFREY R [[id number]]    Follow-up: Return if symptoms worsen or fail to improve.   PLAN Refer to [place] attention specialist - info attached. Will give bactrim bid for 5 days, return if worsening Patient encouraged to call clinic with any questions, comments, or concerns.  Maximiano Coss, NP

## 2021-05-21 NOTE — Patient Instructions (Addendum)
My recommendation for assessment for attention deficit disorders is [facility]. While they treat ADD and ADHD, they operate under their primary care licenses, so no referral is required. Their information is as below:  [facility] [phone], [place], [place] [address]  Phone: [phone] Email: [email or web address]  If they are able to make a diagnosis and establish an effective medication routine, I am happy to provide maintenance and refills of that medication.    Take bactrim twice daily for 5 days If thumb worsens, call me   Thanks,  Rich   If you have lab work done today you will be contacted with your lab results within the next 2 weeks.  If you have not heard from [place] then please contact us. The fastest way to get your results is to register for My Chart.   IF you received an x-ray today, you will receive an invoice from [facility]. Please contact [facility] at [phone] with questions or concerns regarding your invoice.   IF you received labwork today, you will receive an invoice from [facility]. Please contact [facility] at [phone] with questions or concerns regarding your invoice.   Our billing staff will not be able to assist you with questions regarding bills from these companies.  You will be contacted with the lab results as soon as they are available. The fastest way to get your results is to activate your My Chart account. Instructions are located on the last page of this paperwork. If you have not heard from [place] regarding the results in 2 weeks, please contact this office.

## 2021-06-12 ENCOUNTER — Other Ambulatory Visit: Payer: Self-pay | Admitting: Registered Nurse

## 2021-06-12 DIAGNOSIS — F319 Bipolar disorder, unspecified: Secondary | ICD-10-CM

## 2021-06-12 MED ORDER — TAMSULOSIN HCL 0.4 MG PO CAPS: 0.8000 mg | ORAL_CAPSULE | Freq: Every day | ORAL | 0 refills | Status: DC

## 2021-06-12 MED ORDER — LAMOTRIGINE 200 MG PO TABS: 200.0000 mg | ORAL_TABLET | Freq: Every morning | ORAL | 0 refills | Status: DC

## 2021-06-12 NOTE — Telephone Encounter (Signed)
..  Caller name:  Sameul Tagle  Caller callback #:  [phone]  Encourage patient to contact the pharmacy for refills or they can request refills through [facility]  (Please schedule appointment if patient has not been seen in over a year)  MEDICATION NAME & DOSE:  Lamictal and Flomax  Notes/Comments from patient:  Needs 90 day prescriptions for both.  Richard has never prescribed these before but patient states he has been on them for years.  They should be in his records. WHAT PHARMACY WOULD THEY LIKE THIS SENT TO:   [facility] [address]  Please notify patient: It takes 48-72 hours to process rx refill requests Ask patient to call pharmacy to ensure rx is ready before heading there.   (CLINICAL TO FILL OR ROUTE PER PROTOCOLS)

## 2021-06-12 NOTE — Telephone Encounter (Signed)
Patient medication request has been sent to a provider.

## 2021-06-12 NOTE — Telephone Encounter (Signed)
Patient is requesting a refill of the following medications: Requested Prescriptions   Pending Prescriptions Disp Refills   lamoTRIgine (LAMICTAL) 200 MG tablet 90 tablet 0    Sig: Take 1 tablet (200 mg total) by mouth in the morning.   tamsulosin (FLOMAX) 0.4 MG CAPS capsule 30 capsule 0    Sig: Take 2 capsules (0.8 mg total) by mouth daily.    Date of patient request: 06/12/2021 Last office visit: 05/21/2021 Date of last refill: Flomax 12/2020 Last refill amount:  Follow up time period per chart: 07/30/2021

## 2021-06-13 ENCOUNTER — Telehealth: Payer: Self-pay | Admitting: Registered Nurse

## 2021-06-13 NOTE — Telephone Encounter (Signed)
Pt called in stating that the he only got a 15 day supply of the Tamsulosin, he normally gets a 90 day supply. Can we correct this and [facility] [address].  Please advise   Last appt was 05/21/21

## 2021-06-14 ENCOUNTER — Other Ambulatory Visit: Payer: Self-pay

## 2021-06-14 MED ORDER — TAMSULOSIN HCL 0.4 MG PO CAPS: 0.8000 mg | ORAL_CAPSULE | Freq: Every day | ORAL | 0 refills | Status: DC

## 2021-06-14 NOTE — Telephone Encounter (Signed)
Resent

## 2021-06-19 ENCOUNTER — Telehealth: Payer: Self-pay

## 2021-06-19 NOTE — Telephone Encounter (Signed)
Error

## 2021-06-25 ENCOUNTER — Ambulatory Visit (INDEPENDENT_AMBULATORY_CARE_PROVIDER_SITE_OTHER): Payer: PPO

## 2021-06-25 ENCOUNTER — Encounter: Payer: Self-pay | Admitting: Registered Nurse

## 2021-06-25 ENCOUNTER — Encounter: Payer: Self-pay | Admitting: Orthopaedic Surgery

## 2021-06-25 ENCOUNTER — Ambulatory Visit (INDEPENDENT_AMBULATORY_CARE_PROVIDER_SITE_OTHER): Payer: PPO | Admitting: Registered Nurse

## 2021-06-25 ENCOUNTER — Ambulatory Visit: Payer: PPO | Admitting: Orthopaedic Surgery

## 2021-06-25 ENCOUNTER — Other Ambulatory Visit: Payer: Self-pay

## 2021-06-25 VITALS — BP 142/74 | HR 60 | Temp 98.1°F | Resp 18 | Ht 68.0 in | Wt 165.2 lb

## 2021-06-25 VITALS — BP 159/79 | Ht 68.0 in | Wt 165.0 lb

## 2021-06-25 DIAGNOSIS — F988 Other specified behavioral and emotional disorders with onset usually occurring in childhood and adolescence: Secondary | ICD-10-CM

## 2021-06-25 DIAGNOSIS — F319 Bipolar disorder, unspecified: Secondary | ICD-10-CM | POA: Diagnosis not present

## 2021-06-25 DIAGNOSIS — R053 Chronic cough: Secondary | ICD-10-CM | POA: Diagnosis not present

## 2021-06-25 DIAGNOSIS — M545 Low back pain, unspecified: Secondary | ICD-10-CM

## 2021-06-25 MED ORDER — METHOCARBAMOL 500 MG PO TABS: 500.0000 mg | ORAL_TABLET | Freq: Three times a day (TID) | ORAL | 0 refills | Status: DC | PRN

## 2021-06-25 MED ORDER — SERTRALINE HCL 100 MG PO TABS
50.0000 mg | ORAL_TABLET | Freq: Every day | ORAL | 1 refills | Status: DC
Start: 2021-06-25 — End: 2021-12-19

## 2021-06-25 MED ORDER — MONTELUKAST SODIUM 10 MG PO TABS: 10.0000 mg | ORAL_TABLET | Freq: Every day | ORAL | 3 refills | Status: DC

## 2021-06-25 MED ORDER — PREDNISONE 10 MG (21) PO TBPK: ORAL_TABLET | ORAL | 0 refills | Status: DC

## 2021-06-25 MED ORDER — ALBUTEROL SULFATE HFA 108 (90 BASE) MCG/ACT IN AERS: 2.0000 | INHALATION_SPRAY | Freq: Four times a day (QID) | RESPIRATORY_TRACT | 0 refills | Status: DC | PRN

## 2021-06-25 NOTE — Patient Instructions (Signed)
° ° ° °  If you have lab work done today you will be contacted with your lab results within the next 2 weeks.  If you have not heard from us then please contact us. The fastest way to get your results is to register for My Chart. ° ° °IF you received an x-ray today, you will receive an invoice from [facility]. Please contact [facility] at [phone] with questions or concerns regarding your invoice.  ° °IF you received labwork today, you will receive an invoice from [facility]. Please contact [facility] at [phone] with questions or concerns regarding your invoice.  ° °Our billing staff will not be able to assist you with questions regarding bills from these companies. ° °You will be contacted with the lab results as soon as they are available. The fastest way to get your results is to activate your My Chart account. Instructions are located on the last page of this paperwork. If you have not heard from us regarding the results in 2 weeks, please contact this office. °  ° ° ° °

## 2021-06-25 NOTE — Progress Notes (Signed)
Established Patient Office Visit  Subjective:  Patient ID: Paul Ray, male    DOB: Dec 11, 1955  Age: 66 y.o. MRN: [id number]  CC:  Chief Complaint  Patient presents with   Cough    Patient states he has a cough that has been going on for the last 3 years but it comes and goes that comes with some phlegm.    HPI Paul Ray presents for cough  On and off x 3 years  In the past has been on a variety of antibiotics without effect.  No fever. No hx of acute illnesses in 10+ years.  Last week coughed up phlegm, size of dime, discolored greenish yellow.   Never smoker.  Abscess of lung in November 14, 2010 - occ pleuritic pain since, but no consistent symptoms.   Never used inhalers or antihistamines in the past.  Steroids in past - no effect.   No clear pattern to symptoms seasonally, time of day, after exercise, etc.    Notes ongoing depression Cries most days Past Medical History:  Diagnosis Date   Abscess of lung(513.0)    Right lower lobe   ADD (attention deficit disorder)    Anxiety    Arthritis    Ascending aortic aneurysm    40mm by Chest CTA 05/2020 and 4mm by echo 05/2020   Benign essential HTN    Bipolar depression ([place])    Coronary artery calcification seen on CAT scan    Empyema lung ([place])    Hemorrhoids    Hepatitis C    Pneumonia    last year   Prostate abscess    Viral pericarditis    after COVID 19 infection    Past Surgical History:  Procedure Laterality Date   ELBOW SURGERY  2005   Right   FOOT NEUROMA SURGERY  2006   Left   HIP RESURFACING     left hip at [facility] 01/2011   LUMBAR LAMINECTOMY/DECOMPRESSION MICRODISCECTOMY  07/24/2011   Procedure: LUMBAR LAMINECTOMY/DECOMPRESSION MICRODISCECTOMY;  Surgeon: Eustace Moore;  Location: [place] [facility];  Service: Neurosurgery;  Laterality: Bilateral;  Bilateral  , Lumbar Three-Four, Lumbar Four-Five Decompressive Laminectomy Rm # 32   LUNG SURGERY     for empyema   SHOULDER ARTHROSCOPY Right  07/05/2013   Procedure: RIGHT SHOULDER ARTHROSCOPY WITH DEBRIDEMENT;  Surgeon: Mcarthur Rossetti, MD;  Location: [place];  Service: Orthopedics;  Laterality: Right;   TENDON TRANSFER Left 01/26/2014   Procedure: LEFT THUMB TRAPEZIECTOMY KNOTTED TENDON INTRAPOSITION TRANSFER OF ABDUCTOR POLLICIS LONGUS TO [place];  Surgeon: Cammie Sickle, MD;  Location: [place];  Service: Orthopedics;  Laterality: Left;   THORACOTOMY  November 14 2010   rt    Family History  Problem Relation Age of Onset   COPD Mother    Heart disease Mother    Hypertension Mother    Coronary artery disease Father    Dementia Father    Heart failure Father    Prostate cancer Paternal Grandfather        also had bone cancer   Breast cancer Maternal Grandmother    Cancer Paternal Grandmother        unsure what kind    Social History   Socioeconomic History   Marital status: Single    Spouse name: Not on file   Number of children: 2   Years of education: Not on file   Highest education level: Not on file  Occupational History  Occupation: Games developer  Tobacco Use   Smoking status: Never   Smokeless tobacco: Never  Vaping Use   Vaping Use: Never used  Substance and Sexual Activity   Alcohol use: No    Comment: quit drinking in 1989   Drug use: No   Sexual activity: Yes  Other Topics Concern   Not on file  Social History Narrative   2 children: [name] and [name]   Social Determinants of Health   Financial Resource Strain: Not on file  Food Insecurity: Not on file  Transportation Needs: Not on file  Physical Activity: Not on file  Stress: Not on file  Social Connections: Not on file  Intimate Partner Violence: Not on file    Outpatient Medications Prior to Visit  Medication Sig Dispense Refill   lamoTRIgine (LAMICTAL) 200 MG tablet Take 1 tablet (200 mg total) by mouth in the morning. 90 tablet 0   sildenafil (VIAGRA) 50 MG tablet Take 50 mg by mouth daily as needed.      tamsulosin (FLOMAX) 0.4 MG CAPS capsule Take 2 capsules (0.8 mg total) by mouth daily. 180 capsule 0   alprazolam (XANAX) 2 MG tablet Take 1 tablet (2 mg total) by mouth 2 (two) times daily as needed for sleep. 180 tablet 0   atorvastatin (LIPITOR) 20 MG tablet Take 1 tablet (20 mg total) by mouth daily. (Patient not taking: Reported on 08/13/2021) 90 tablet 3   sulfamethoxazole-trimethoprim (BACTRIM DS) 800-160 MG tablet Take 1 tablet by mouth 2 (two) times daily. (Patient not taking: Reported on 08/13/2021) 10 tablet 0   diclofenac (VOLTAREN) 50 MG EC tablet Take 1 tablet (50 mg total) by mouth 2 (two) times daily. (Patient not taking: Reported on 08/13/2021) 30 tablet 1   diclofenac Sodium (VOLTAREN) 1 % GEL Apply 2 g topically 4 (four) times daily. (Patient not taking: Reported on 08/13/2021) 100 g 1   gabapentin (NEURONTIN) 100 MG capsule Take 1 capsule (100 mg total) by mouth 2 (two) times daily. (Patient not taking: Reported on 08/13/2021) 60 capsule 1   No facility-administered medications prior to visit.    Allergies  Allergen Reactions   Quinolones Other (See Comments)    Patient was warned about not using Cipro and similar antibiotics. Recent studies have raised concern that fluoroquinolone antibiotics could be associated with an increased risk of aortic aneurysm Fluoroquinolones have non-antimicrobial properties that might jeopardise the integrity of the extracellular matrix of the vascular wall In a  propensity score matched cohort study in [place], there was a 66% increased rate of aortic aneurysm or dissection associated with oral fluoroquinolone use, compared wit    ROS Review of Systems    Objective:    Physical Exam  BP (!) 142/74   Pulse 60   Temp 98.1 F (36.7 C) (Temporal)   Resp 18   Ht 5\' 8"  (1.727 m)   Wt 165 lb 3.2 oz (74.9 kg)   BMI 25.12 kg/m  Wt Readings from Last 3 Encounters:  08/13/21 169 lb 6.4 oz (76.8 kg)  07/24/21 165 lb (74.8 kg)  06/25/21 165 lb  (74.8 kg)     There are no preventive care reminders to display for this patient.   There are no preventive care reminders to display for this patient.  Lab Results  Component Value Date   TSH 2.28 08/13/2021   Lab Results  Component Value Date   WBC 5.2 08/13/2021   HGB 15.0 08/13/2021   HCT 45.7 08/13/2021  MCV 86.5 08/13/2021   PLT 186.0 08/13/2021   Lab Results  Component Value Date   NA 138 08/13/2021   K 4.2 08/13/2021   CO2 29 08/13/2021   GLUCOSE 84 08/13/2021   BUN 29 (H) 08/13/2021   CREATININE 0.70 08/13/2021   BILITOT 0.6 08/13/2021   ALKPHOS 59 08/13/2021   AST 22 08/13/2021   ALT 11 08/13/2021   PROT 6.9 08/13/2021   ALBUMIN 4.6 08/13/2021   CALCIUM 9.0 08/13/2021   ANIONGAP 6 11/11/2020   GFR 97.27 08/13/2021   Lab Results  Component Value Date   CHOL 193 08/13/2021   Lab Results  Component Value Date   HDL 46.70 08/13/2021   Lab Results  Component Value Date   LDLCALC 137 (H) 08/13/2021   Lab Results  Component Value Date   TRIG 47.0 08/13/2021   Lab Results  Component Value Date   CHOLHDL 4 08/13/2021   Lab Results  Component Value Date   HGBA1C 5.8 08/13/2021      Assessment & Plan:   Problem List Items Addressed This Visit       Other   Bipolar depression ([place])   Relevant Medications   sertraline (ZOLOFT) 100 MG tablet   Other Relevant Orders   Ambulatory referral to Psychiatry   ADD (attention deficit disorder)   Relevant Medications   sertraline (ZOLOFT) 100 MG tablet   Other Relevant Orders   Ambulatory referral to Psychiatry   Other Visit Diagnoses     Chronic cough    -  Primary       Meds ordered this encounter  Medications   DISCONTD: montelukast (SINGULAIR) 10 MG tablet    Sig: Take 1 tablet (10 mg total) by mouth at bedtime.    Dispense:  30 tablet    Refill:  3    Order Specific Question:   Supervising Provider    Answer:   Carlota Raspberry, JEFFREY R [[id number]]   DISCONTD: albuterol (VENTOLIN HFA) 108  (90 Base) MCG/ACT inhaler    Sig: Inhale 2 puffs into the lungs every 6 (six) hours as needed for wheezing or shortness of breath.    Dispense:  8 g    Refill:  0    Order Specific Question:   Supervising Provider    Answer:   Carlota Raspberry, JEFFREY R [[id number]]   sertraline (ZOLOFT) 100 MG tablet    Sig: Take 0.5 tablets (50 mg total) by mouth daily.    Dispense:  90 tablet    Refill:  1    Order Specific Question:   Supervising Provider    Answer:   Carlota Raspberry, JEFFREY R [[id number]]    Follow-up: No follow-ups on file.   PLAN Suspect allergies. Will give albuterol for prn use, montelukast nightly. Refill zoloft Call if worsening or failing to improve Patient encouraged to call clinic with any questions, comments, or concerns.  Maximiano Coss, NP

## 2021-06-25 NOTE — Progress Notes (Signed)
Office Visit Note   Patient: Paul Ray           Date of Birth: [birth year]           MRN: [id number] Visit Date: 06/25/2021              Requested by: Maximiano Coss, NP [address] [place],  [place] [id number] PCP: Maximiano Coss, NP   Assessment & Plan: Visit Diagnoses:  1. Acute midline low back pain, unspecified whether sciatica present     Plan: Prednisone Dosepak.  We will send in some Robaxin.  We discussed avoiding strong narcotic medication.  He is ambulatory and improved.  We will check him back again in 3 weeks.  If present persistent problems will consider diagnostic imaging.  Follow-Up Instructions: No follow-ups on file.   Orders:  Orders Placed This Encounter  Procedures   XR Lumbar Spine 2-3 Views   No orders of the defined types were placed in this encounter.     Procedures: No procedures performed   Clinical Data: No additional findings.   Subjective: Chief Complaint  Patient presents with   Lower Back - Pain    HPI 65 year old male returns had previous lumbar laminectomy 07/24/2011 by Dr. Sherley Bounds for decompression L3-4 and L4-5 with microdiscectomy on the left at L3-4.  Patient states he had done well until 1 week ago when he had severe back pain barely able to move had trouble walking.  He has used Aleve, heat, Tiger balm, Voltaren gel without relief.  He states he is actually gotten a little bit better in the last 24 hours.  Patient had unintended durotomy at L3-4 on the left which was primarily repaired.  Review of Systems patient has some arthritis of his right hip cervical foraminal stenosis history of psychoactive substance abuse induced psychosis.  Bipolar depression.  No chills or fever, no bowel bladder associated symptoms.   Objective: Vital Signs: BP (!) 159/79   Ht 5\' 8"  (1.727 m)   Wt 165 lb (74.8 kg)   BMI 25.09 kg/m   Physical Exam Constitutional:      Appearance: He is well-developed.  HENT:     Head:  Normocephalic and atraumatic.     Right Ear: External ear normal.     Left Ear: External ear normal.  Eyes:     Pupils: Pupils are equal, round, and reactive to light.  Neck:     Thyroid: No thyromegaly.     Trachea: No tracheal deviation.  Cardiovascular:     Rate and Rhythm: Normal rate.  Pulmonary:     Effort: Pulmonary effort is normal.     Breath sounds: No wheezing.  Abdominal:     General: Bowel sounds are normal.     Palpations: Abdomen is soft.  Musculoskeletal:     Cervical back: Neck supple.  Skin:    General: Skin is warm and dry.     Capillary Refill: Capillary refill takes less than 2 seconds.  Neurological:     Mental Status: He is alert and oriented to person, place, and time.  Psychiatric:        Behavior: Behavior normal.        Thought Content: Thought content normal.        Judgment: Judgment normal.    Ortho Exam well-healed lumbar incision mild sciatic notch tenderness.  He is able to heel and toe walk with associated back pain.  Negative logroll the hips.  Knee  and ankle jerk are intact anterior tib EHL is intact.  Specialty Comments:  No specialty comments available.  Imaging: No results found.   PMFS History: Patient Active Problem List   Diagnosis Date Noted   Foraminal stenosis of cervical region 03/08/2021   Viral pericarditis    Alcohol use disorder, severe, dependence ([place]) 12/06/2020   Chest pain 11/09/2020   Ascending aortic aneurysm    Benign essential HTN    Coronary artery calcification seen on CAT scan    Psychoactive substance-induced psychosis ([place])    Rib pain 07/01/2017   Closed fracture of one rib of left side 07/01/2017   Chronic right shoulder pain 05/04/2017   Osteoarthritis of right hip 12/02/2016   Lumbar radiculopathy 04/17/2014   Chronic low back pain 08/16/2013   Lumbar post-laminectomy syndrome 08/16/2013   Lumbosacral radiculitis 08/16/2013   Impingement syndrome of right shoulder 07/05/2013   Empyema lung  (HCC)    Abscess of lung(513.0)    Bipolar depression ([place])    ADD (attention deficit disorder)    Hepatitis C    Hip pain 05/02/2011   History of total hip arthroplasty 04/23/2011   Lung mass 10/22/2010   Pleural effusion 10/15/2010   Past Medical History:  Diagnosis Date   Abscess of lung(513.0)    Right lower lobe   ADD (attention deficit disorder)    Anxiety    Arthritis    Ascending aortic aneurysm    14mm by Chest CTA 05/2020 and 37mm by echo 05/2020   Benign essential HTN    Bipolar depression ([place])    Coronary artery calcification seen on CAT scan    Empyema lung ([place])    Hemorrhoids    Hepatitis C    Pneumonia    last year   Prostate abscess    Viral pericarditis    after COVID 19 infection    Family History  Problem Relation Age of Onset   COPD Mother    Heart disease Mother    Hypertension Mother    Coronary artery disease Father    Dementia Father    Heart failure Father    Prostate cancer Paternal Grandfather        also had bone cancer   Breast cancer Maternal Grandmother    Cancer Paternal Grandmother        unsure what kind    Past Surgical History:  Procedure Laterality Date   ELBOW SURGERY  2005   Right   FOOT NEUROMA SURGERY  2006   Left   HIP RESURFACING     [facility] hip [facility] 01/2011   LUMBAR LAMINECTOMY/DECOMPRESSION MICRODISCECTOMY  07/24/2011   Procedure: LUMBAR LAMINECTOMY/DECOMPRESSION MICRODISCECTOMY;  Surgeon: Eustace Moore;  Location: [place] [facility];  Service: Neurosurgery;  Laterality: Bilateral;  Bilateral  , Lumbar Three-Four, Lumbar Four-Five Decompressive Laminectomy Rm # 32   LUNG SURGERY     for empyema   SHOULDER ARTHROSCOPY Right 07/05/2013   Procedure: RIGHT SHOULDER ARTHROSCOPY WITH DEBRIDEMENT;  Surgeon: Mcarthur Rossetti, MD;  Location: [facility];  Service: Orthopedics;  Laterality: Right;   TENDON TRANSFER Left 01/26/2014   Procedure: LEFT THUMB TRAPEZIECTOMY KNOTTED TENDON INTRAPOSITION TRANSFER OF ABDUCTOR POLLICIS  LONGUS TO [place];  Surgeon: Cammie Sickle, MD;  Location: [facility];  Service: Orthopedics;  Laterality: Left;   THORACOTOMY  November 14 2010   rt   Social History   Occupational History   Occupation: Games developer  Tobacco Use   Smoking status: Never  Smokeless tobacco: Never  Vaping Use   Vaping Use: Never used  Substance and Sexual Activity   Alcohol use: No    Comment: quit drinking in 1989   Drug use: No   Sexual activity: Not on file

## 2021-06-26 ENCOUNTER — Telehealth: Payer: Self-pay | Admitting: Orthopaedic Surgery

## 2021-06-26 DIAGNOSIS — M545 Low back pain, unspecified: Secondary | ICD-10-CM

## 2021-06-26 NOTE — Telephone Encounter (Signed)
Pt called stating he had an appt on 06/25/21 and discussed an MRI but Dr.Yates didn't think insurance would approve. Pt called his insurance and they said they would cover it so he would like a order put in. Pt would like this done asap since he's in a lot of pain; he would like a CB updating him when the referral is sent.   [phone]

## 2021-06-26 NOTE — Telephone Encounter (Signed)
MRI order entered. I called patient and advised.

## 2021-06-26 NOTE — Telephone Encounter (Signed)
Please advise 

## 2021-07-02 ENCOUNTER — Ambulatory Visit: Payer: PPO | Admitting: Orthopaedic Surgery

## 2021-07-14 ENCOUNTER — Ambulatory Visit
Admission: RE | Admit: 2021-07-14 | Discharge: 2021-07-14 | Disposition: A | Discharge status: Home / Self Care | Payer: PPO | Source: Ambulatory Visit | Attending: Orthopaedic Surgery | Admitting: Orthopaedic Surgery

## 2021-07-14 DIAGNOSIS — M2578 Osteophyte, vertebrae: Secondary | ICD-10-CM | POA: Diagnosis not present

## 2021-07-14 DIAGNOSIS — R200 Anesthesia of skin: Secondary | ICD-10-CM | POA: Diagnosis not present

## 2021-07-14 DIAGNOSIS — R202 Paresthesia of skin: Secondary | ICD-10-CM | POA: Diagnosis not present

## 2021-07-14 DIAGNOSIS — M545 Low back pain, unspecified: Secondary | ICD-10-CM

## 2021-07-14 DIAGNOSIS — M48061 Spinal stenosis, lumbar region without neurogenic claudication: Secondary | ICD-10-CM | POA: Diagnosis not present

## 2021-07-16 ENCOUNTER — Ambulatory Visit: Payer: PPO | Admitting: Orthopaedic Surgery

## 2021-07-17 ENCOUNTER — Telehealth: Payer: Self-pay | Admitting: Orthopaedic Surgery

## 2021-07-17 DIAGNOSIS — M545 Low back pain, unspecified: Secondary | ICD-10-CM

## 2021-07-17 NOTE — Telephone Encounter (Signed)
Please advise 

## 2021-07-17 NOTE — Telephone Encounter (Signed)
Patient called needing a call back concerning the results of his MRI. Patient asked if he can get the results over the phone. The number to contact patient is [phone]

## 2021-07-17 NOTE — Telephone Encounter (Signed)
Referral entered  

## 2021-07-19 ENCOUNTER — Other Ambulatory Visit: Payer: PPO

## 2021-07-22 ENCOUNTER — Other Ambulatory Visit: Payer: PPO

## 2021-07-23 ENCOUNTER — Other Ambulatory Visit: Payer: Self-pay | Admitting: Registered Nurse

## 2021-07-23 DIAGNOSIS — G479 Sleep disorder, unspecified: Secondary | ICD-10-CM

## 2021-07-23 NOTE — Telephone Encounter (Signed)
Pt called in asking for a new script of the xanax to be sent to the [facility] on [address].   Please advise last fill date 04/24/21, pt would like a 90 day supply

## 2021-07-24 ENCOUNTER — Ambulatory Visit (INDEPENDENT_AMBULATORY_CARE_PROVIDER_SITE_OTHER): Payer: PPO | Admitting: Orthopaedic Surgery

## 2021-07-24 ENCOUNTER — Encounter: Payer: Self-pay | Admitting: Orthopaedic Surgery

## 2021-07-24 ENCOUNTER — Other Ambulatory Visit: Payer: Self-pay

## 2021-07-24 VITALS — BP 148/83 | HR 64 | Ht 68.0 in | Wt 165.0 lb

## 2021-07-24 DIAGNOSIS — M961 Postlaminectomy syndrome, not elsewhere classified: Secondary | ICD-10-CM

## 2021-07-24 DIAGNOSIS — M48061 Spinal stenosis, lumbar region without neurogenic claudication: Secondary | ICD-10-CM

## 2021-07-24 MED ORDER — ALPRAZOLAM 2 MG PO TABS: 2.0000 mg | ORAL_TABLET | Freq: Two times a day (BID) | ORAL | 0 refills | Status: DC | PRN

## 2021-07-24 NOTE — Telephone Encounter (Signed)
Medication request has been sent to [facility].

## 2021-07-24 NOTE — Progress Notes (Signed)
Office Visit Note   Patient: Paul Ray           Date of Birth: [birth year]           MRN: [id number] Visit Date: 07/24/2021              Requested by: Maximiano Coss, NP [address] [place],   [id number] PCP: Maximiano Coss, NP   Assessment & Plan: Visit Diagnoses:  1. Lumbar post-laminectomy syndrome   2. Spinal stenosis of lumbar region, unspecified whether neurogenic claudication present     Plan: We will follow-up after the epidural.  We discussed options if he does not respond with the injection.  He does have severe multifactorial stenosis at L2-3.  Some broad-based disc material slight caudad migration at L3-4 the previous operative level read a dural tear and repair.  Some anterolisthesis at L4-5 with increased room from his decompression surgery.  Follow-Up Instructions: Return in about 4 weeks (around 08/21/2021).   Orders:  No orders of the defined types were placed in this encounter.  No orders of the defined types were placed in this encounter.     Procedures: No procedures performed   Clinical Data: No additional findings.   Subjective: Chief Complaint  Patient presents with   Neck - Pain   Lower Back - Pain    MRI review    HPI 65 year old male 1 month follow-up with continued ongoing problems with back pain and neck pain.  He states he is scheduled for an epidural on 08/14/2021.  If he has increase activities he states he pays for it and has increased pain.  He has used muscle relaxants anti-inflammatories.  Previous decompression surgery by Dr. Sherley Bounds at L3-4 and L4-5.  Patient's MRI showed adjacent level stenosis at L2-3.  Additional medical history includes bipolar depression.  History of psychoactive substance abuse induced psychosis.  History of cervical foraminal stenosis with multilevel disc degeneration but no central stenosis.  Review of Systems 14 point system update unchanged from 06/25/2021 office visit other than as  mentioned above.   Objective: Vital Signs: BP (!) 148/83    Pulse 64    Ht 5\' 8"  (1.727 m)    Wt 165 lb (74.8 kg)    BMI 25.09 kg/m   Physical Exam Constitutional:      Appearance: He is well-developed.  HENT:     Head: Normocephalic and atraumatic.     Right Ear: External ear normal.     Left Ear: External ear normal.  Eyes:     Pupils: Pupils are equal, round, and reactive to light.  Neck:     Thyroid: No thyromegaly.     Trachea: No tracheal deviation.  Cardiovascular:     Rate and Rhythm: Normal rate.  Pulmonary:     Effort: Pulmonary effort is normal.     Breath sounds: No wheezing.  Abdominal:     General: Bowel sounds are normal.     Palpations: Abdomen is soft.  Musculoskeletal:     Cervical back: Neck supple.  Skin:    General: Skin is warm and dry.     Capillary Refill: Capillary refill takes less than 2 seconds.  Neurological:     Mental Status: He is alert and oriented to person, place, and time.  Psychiatric:        Behavior: Behavior normal.        Thought Content: Thought content normal.  Judgment: Judgment normal.    Ortho Exam patient has brachial plexus tenderness.  Rotation of the neck limited to 50% normal rotation with discomfort.  Upper extremity reflexes are intact he is has normal heel toe gait.  No pain with internal or external rotation of his hips.  Well-healed midline incision at L3-L5 level.  Specialty Comments:  No specialty comments available.  Imaging: No results found.   PMFS History: Patient Active Problem List   Diagnosis Date Noted   Spinal stenosis of lumbar region 07/25/2021   Foraminal stenosis of cervical region 03/08/2021   Viral pericarditis    Alcohol use disorder, severe, dependence ([place]) 12/06/2020   Chest pain 11/09/2020   Ascending aortic aneurysm    Benign essential HTN    Coronary artery calcification seen on CAT scan    Psychoactive substance-induced psychosis ([place])    Rib pain 07/01/2017   Closed  fracture of one rib of left side 07/01/2017   Chronic right shoulder pain 05/04/2017   Osteoarthritis of right hip 12/02/2016   Lumbar radiculopathy 04/17/2014   Chronic low back pain 08/16/2013   Lumbar post-laminectomy syndrome 08/16/2013   Lumbosacral radiculitis 08/16/2013   Impingement syndrome of right shoulder 07/05/2013   Empyema lung (HCC)    Abscess of lung(513.0)    Bipolar depression ([place])    ADD (attention deficit disorder)    Hepatitis C    Hip pain 05/02/2011   History of total hip arthroplasty 04/23/2011   Lung mass 10/22/2010   Pleural effusion 10/15/2010   Past Medical History:  Diagnosis Date   Abscess of lung(513.0)    Right lower lobe   ADD (attention deficit disorder)    Anxiety    Arthritis    Ascending aortic aneurysm    32mm by Chest CTA 05/2020 and 70mm by echo 05/2020   Benign essential HTN    Bipolar depression ([place])    Coronary artery calcification seen on CAT scan    Empyema lung ([place])    Hemorrhoids    Hepatitis C    Pneumonia    last year   Prostate abscess    Viral pericarditis    after COVID 19 infection    Family History  Problem Relation Age of Onset   COPD Mother    Heart disease Mother    Hypertension Mother    Coronary artery disease Father    Dementia Father    Heart failure Father    Prostate cancer Paternal Grandfather        also had bone cancer   Breast cancer Maternal Grandmother    Cancer Paternal Grandmother        unsure what kind    Past Surgical History:  Procedure Laterality Date   ELBOW SURGERY  2005   Right   FOOT NEUROMA SURGERY  2006   Left   HIP RESURFACING     left hip [facility] 01/2011   LUMBAR LAMINECTOMY/DECOMPRESSION MICRODISCECTOMY  07/24/2011   Procedure: LUMBAR LAMINECTOMY/DECOMPRESSION MICRODISCECTOMY;  Surgeon: Eustace Moore;  Location: [place] [facility];  Service: Neurosurgery;  Laterality: Bilateral;  Bilateral  , Lumbar Three-Four, Lumbar Four-Five Decompressive Laminectomy Rm # 32   LUNG  SURGERY     for empyema   SHOULDER ARTHROSCOPY Right 07/05/2013   Procedure: RIGHT SHOULDER ARTHROSCOPY WITH DEBRIDEMENT;  Surgeon: Mcarthur Rossetti, MD;  Location: [facility];  Service: Orthopedics;  Laterality: Right;   TENDON TRANSFER Left 01/26/2014   Procedure: LEFT THUMB TRAPEZIECTOMY KNOTTED TENDON INTRAPOSITION TRANSFER OF  ABDUCTOR POLLICIS LONGUS TO [place];  Surgeon: Cammie Sickle, MD;  Location: [facility];  Service: Orthopedics;  Laterality: Left;   THORACOTOMY  November 14 2010   rt   Social History   Occupational History   Occupation: Games developer  Tobacco Use   Smoking status: Never   Smokeless tobacco: Never  Vaping Use   Vaping Use: Never used  Substance and Sexual Activity   Alcohol use: No    Comment: quit drinking in 1989   Drug use: No   Sexual activity: Not on file

## 2021-07-24 NOTE — Telephone Encounter (Signed)
Patient is requesting a refill of the following medications: Requested Prescriptions   Pending Prescriptions Disp Refills   alprazolam (XANAX) 2 MG tablet 180 tablet 0    Sig: Take 1 tablet (2 mg total) by mouth 2 (two) times daily as needed for sleep.    Date of patient request: 07/24/2021 Last office visit: 06/25/2021 Date of last refill: 04/24/2021 Last refill amount: 180 tablets Follow up time period per chart: 07/30/2021

## 2021-07-25 DIAGNOSIS — M48061 Spinal stenosis, lumbar region without neurogenic claudication: Secondary | ICD-10-CM | POA: Insufficient documentation

## 2021-07-30 ENCOUNTER — Encounter: Payer: PPO | Admitting: Registered Nurse

## 2021-08-13 ENCOUNTER — Ambulatory Visit (INDEPENDENT_AMBULATORY_CARE_PROVIDER_SITE_OTHER): Payer: PPO | Admitting: Registered Nurse

## 2021-08-13 ENCOUNTER — Encounter: Payer: Self-pay | Admitting: Registered Nurse

## 2021-08-13 VITALS — BP 122/70 | HR 88 | Temp 98.3°F | Resp 16 | Ht 68.0 in | Wt 169.4 lb

## 2021-08-13 DIAGNOSIS — G479 Sleep disorder, unspecified: Secondary | ICD-10-CM | POA: Diagnosis not present

## 2021-08-13 DIAGNOSIS — Z0000 Encounter for general adult medical examination without abnormal findings: Secondary | ICD-10-CM

## 2021-08-13 DIAGNOSIS — Z125 Encounter for screening for malignant neoplasm of prostate: Secondary | ICD-10-CM | POA: Diagnosis not present

## 2021-08-13 DIAGNOSIS — Z1329 Encounter for screening for other suspected endocrine disorder: Secondary | ICD-10-CM | POA: Diagnosis not present

## 2021-08-13 DIAGNOSIS — Z130 Encounter for screening for diseases of the blood and blood-forming organs and certain disorders involving the immune mechanism: Secondary | ICD-10-CM | POA: Diagnosis not present

## 2021-08-13 DIAGNOSIS — Z13220 Encounter for screening for lipoid disorders: Secondary | ICD-10-CM

## 2021-08-13 DIAGNOSIS — Z13228 Encounter for screening for other metabolic disorders: Secondary | ICD-10-CM

## 2021-08-13 DIAGNOSIS — F319 Bipolar disorder, unspecified: Secondary | ICD-10-CM

## 2021-08-13 LAB — COMPREHENSIVE METABOLIC PANEL
ALT: 11 U/L (ref 0–53)
AST: 22 U/L (ref 0–37)
Albumin: 4.6 g/dL (ref 3.5–5.2)
Alkaline Phosphatase: 59 U/L (ref 39–117)
BUN: 29 mg/dL — ABNORMAL HIGH (ref 6–23)
CO2: 29 mEq/L (ref 19–32)
Calcium: 9 mg/dL (ref 8.4–10.5)
Chloride: 100 mEq/L (ref 96–112)
Creatinine, Ser: 0.7 mg/dL (ref 0.40–1.50)
GFR: 97.27 mL/min (ref >60.00)
Glucose, Bld: 84 mg/dL (ref 70–99)
Potassium: 4.2 mEq/L (ref 3.5–5.1)
Sodium: 138 mEq/L (ref 135–145)
Total Bilirubin: 0.6 mg/dL (ref 0.2–1.2)
Total Protein: 6.9 g/dL (ref 6.0–8.3)

## 2021-08-13 LAB — LIPID PANEL
Cholesterol: 193 mg/dL (ref 0–200)
HDL: 46.7 mg/dL (ref >39.00)
LDL Cholesterol: 137 mg/dL — ABNORMAL HIGH (ref 0–99)
NonHDL: 146.05
Total CHOL/HDL Ratio: 4
Triglycerides: 47 mg/dL (ref 0.0–149.0)
VLDL: 9.4 mg/dL (ref 0.0–40.0)

## 2021-08-13 LAB — PSA, MEDICARE: PSA: 1.01 ng/ml (ref 0.10–4.00)

## 2021-08-13 LAB — CBC WITH DIFFERENTIAL/PLATELET
Basophils Absolute: 0 10*3/uL (ref 0.0–0.1)
Basophils Relative: 0.9 % (ref 0.0–3.0)
Eosinophils Absolute: 0.2 10*3/uL (ref 0.0–0.7)
Eosinophils Relative: 3 % (ref 0.0–5.0)
HCT: 45.7 % (ref 39.0–52.0)
Hemoglobin: 15 g/dL (ref 13.0–17.0)
Lymphocytes Relative: 26.6 % (ref 12.0–46.0)
Lymphs Abs: 1.4 10*3/uL (ref 0.7–4.0)
MCHC: 32.7 g/dL (ref 30.0–36.0)
MCV: 86.5 fl (ref 78.0–100.0)
Monocytes Absolute: 0.5 10*3/uL (ref 0.1–1.0)
Monocytes Relative: 10.3 % (ref 3.0–12.0)
Neutro Abs: 3.1 10*3/uL (ref 1.4–7.7)
Neutrophils Relative %: 59.2 % (ref 43.0–77.0)
Platelets: 186 10*3/uL (ref 150.0–400.0)
RBC: 5.29 Mil/uL (ref 4.22–5.81)
RDW: 13.6 % (ref 11.5–15.5)
WBC: 5.2 10*3/uL (ref 4.0–10.5)

## 2021-08-13 LAB — HEMOGLOBIN A1C: Hgb A1c MFr Bld: 5.8 % (ref 4.6–6.5)

## 2021-08-13 LAB — TSH: TSH: 2.28 u[IU]/mL (ref 0.35–5.50)

## 2021-08-13 NOTE — Progress Notes (Signed)
Established Patient Office Visit  Subjective:  Patient ID: Paul Ray, male    DOB: 04-10-1956  Age: 66 y.o. MRN: [id number]  CC:  Chief Complaint  Patient presents with   Annual Exam    Pt here for annual exam, notes he has continued neck shoulder and back pain have continued     HPI Paul Ray presents for CPE  Notes ongoing neck, shoulder, and back pain. He does see Dr. Lorin Mercy for this. He has been suggested to have cervical fusion at C4-C5 and C5-C6 level He unfortunately has stopped the gabapentin as he has not seen relief from this. Also didn't see relief from diclofenac or methocarbamol.   Hx of ascending aortic aneurysm and mixed HLD. Has Dc'd atorvastatin ama. Will recheck labs today.  He refuses COVID vaccination ama. Understands risks of this.   Past Medical History:  Diagnosis Date   Abscess of lung(513.0)    Right lower lobe   ADD (attention deficit disorder)    Anxiety    Arthritis    Ascending aortic aneurysm    46mm by Chest CTA 05/2020 and 38mm by echo 05/2020   Benign essential HTN    Bipolar depression ([place])    Coronary artery calcification seen on CAT scan    Empyema lung ([place])    Hemorrhoids    Hepatitis C    Pneumonia    last year   Prostate abscess    Viral pericarditis    after COVID 19 infection    Past Surgical History:  Procedure Laterality Date   ELBOW SURGERY  2005   Right   FOOT NEUROMA SURGERY  2006   Left   HIP RESURFACING     left hip [facility] 01/2011   LUMBAR LAMINECTOMY/DECOMPRESSION MICRODISCECTOMY  07/24/2011   Procedure: LUMBAR LAMINECTOMY/DECOMPRESSION MICRODISCECTOMY;  Surgeon: Eustace Moore;  Location: [place] [facility];  Service: Neurosurgery;  Laterality: Bilateral;  Bilateral  , Lumbar Three-Four, Lumbar Four-Five Decompressive Laminectomy Rm # [id number]   LUNG SURGERY     for empyema   SHOULDER ARTHROSCOPY Right 07/05/2013   Procedure: RIGHT SHOULDER ARTHROSCOPY WITH DEBRIDEMENT;  Surgeon: Mcarthur Rossetti, MD;   Location: [place];  Service: Orthopedics;  Laterality: Right;   TENDON TRANSFER Left 01/26/2014   Procedure: LEFT THUMB TRAPEZIECTOMY KNOTTED TENDON INTRAPOSITION TRANSFER OF ABDUCTOR POLLICIS LONGUS TO [place];  Surgeon: Cammie Sickle, MD;  Location: [place];  Service: Orthopedics;  Laterality: Left;   THORACOTOMY  November 14 2010   rt    Family History  Problem Relation Age of Onset   COPD Mother    Heart disease Mother    Hypertension Mother    Coronary artery disease Father    Dementia Father    Heart failure Father    Prostate cancer Paternal Grandfather        also had bone cancer   Breast cancer Maternal Grandmother    Cancer Paternal Grandmother        unsure what kind    Social History   Socioeconomic History   Marital status: Single    Spouse name: Not on file   Number of children: 2   Years of education: Not on file   Highest education level: Not on file  Occupational History   Occupation: carpenter  Tobacco Use   Smoking status: Never   Smokeless tobacco: Never  Vaping Use   Vaping Use: Never used  Substance and Sexual Activity   Alcohol  use: No    Comment: quit drinking in 1989   Drug use: No   Sexual activity: Yes  Other Topics Concern   Not on file  Social History Narrative   2 children: [name] and [name]   Social Determinants of Health   Financial Resource Strain: Not on file  Food Insecurity: Not on file  Transportation Needs: Not on file  Physical Activity: Not on file  Stress: Not on file  Social Connections: Not on file  Intimate Partner Violence: Not on file    Outpatient Medications Prior to Visit  Medication Sig Dispense Refill   alprazolam (XANAX) 2 MG tablet Take 1 tablet (2 mg total) by mouth 2 (two) times daily as needed for sleep. 180 tablet 0   lamoTRIgine (LAMICTAL) 200 MG tablet Take 1 tablet (200 mg total) by mouth in the morning. 90 tablet 0   sertraline (ZOLOFT) 100 MG tablet Take 0.5 tablets (50 mg total)  by mouth daily. 90 tablet 1   sildenafil (VIAGRA) 50 MG tablet Take 50 mg by mouth daily as needed.     tamsulosin (FLOMAX) 0.4 MG CAPS capsule Take 2 capsules (0.8 mg total) by mouth daily. 180 capsule 0   albuterol (VENTOLIN HFA) 108 (90 Base) MCG/ACT inhaler Inhale 2 puffs into the lungs every 6 (six) hours as needed for wheezing or shortness of breath. (Patient not taking: Reported on 08/13/2021) 8 g 0   atorvastatin (LIPITOR) 20 MG tablet Take 1 tablet (20 mg total) by mouth daily. (Patient not taking: Reported on 08/13/2021) 90 tablet 3   diclofenac (VOLTAREN) 50 MG EC tablet Take 1 tablet (50 mg total) by mouth 2 (two) times daily. (Patient not taking: Reported on 08/13/2021) 30 tablet 1   diclofenac Sodium (VOLTAREN) 1 % GEL Apply 2 g topically 4 (four) times daily. (Patient not taking: Reported on 08/13/2021) 100 g 1   gabapentin (NEURONTIN) 100 MG capsule Take 1 capsule (100 mg total) by mouth 2 (two) times daily. (Patient not taking: Reported on 08/13/2021) 60 capsule 1   methocarbamol (ROBAXIN) 500 MG tablet Take 1 tablet (500 mg total) by mouth every 8 (eight) hours as needed for muscle spasms. (Patient not taking: Reported on 08/13/2021) 30 tablet 0   montelukast (SINGULAIR) 10 MG tablet Take 1 tablet (10 mg total) by mouth at bedtime. (Patient not taking: Reported on 08/13/2021) 30 tablet 3   predniSONE (STERAPRED UNI-PAK 21 TAB) 10 MG (21) TBPK tablet Take 6,5,4,3,2,1 tablets daily by month one tablet less each day with food. (Patient not taking: Reported on 08/13/2021) 21 tablet 0   sulfamethoxazole-trimethoprim (BACTRIM DS) 800-160 MG tablet Take 1 tablet by mouth 2 (two) times daily. (Patient not taking: Reported on 08/13/2021) 10 tablet 0   No facility-administered medications prior to visit.    Allergies  Allergen Reactions   Quinolones Other (See Comments)    Patient was warned about not using Cipro and similar antibiotics. Recent studies have raised concern that fluoroquinolone  antibiotics could be associated with an increased risk of aortic aneurysm Fluoroquinolones have non-antimicrobial properties that might jeopardise the integrity of the extracellular matrix of the vascular wall In a  propensity score matched cohort study in [place], there was a 66% increased rate of aortic aneurysm or dissection associated with oral fluoroquinolone use, compared wit    ROS Review of Systems  Constitutional: Negative.   HENT: Negative.    Eyes: Negative.   Respiratory: Negative.    Cardiovascular: Negative.   Gastrointestinal: Negative.  Genitourinary: Negative.   Musculoskeletal: Negative.   Skin: Negative.   Neurological: Negative.   Psychiatric/Behavioral: Negative.    All other systems reviewed and are negative.    Objective:    Physical Exam Vitals and nursing note reviewed.  Constitutional:      General: He is not in acute distress.    Appearance: Normal appearance. He is normal weight. He is not ill-appearing, toxic-appearing or diaphoretic.  HENT:     Head: Normocephalic and atraumatic.     Right Ear: Tympanic membrane, ear canal and external ear normal. There is no impacted cerumen.     Left Ear: Tympanic membrane, ear canal and external ear normal. There is no impacted cerumen.     Nose: Nose normal. No congestion or rhinorrhea.     Mouth/Throat:     Mouth: Mucous membranes are moist.     Pharynx: Oropharynx is clear. No oropharyngeal exudate or posterior oropharyngeal erythema.  Eyes:     General: No scleral icterus.       Right eye: No discharge.        Left eye: No discharge.     Extraocular Movements: Extraocular movements intact.     Conjunctiva/sclera: Conjunctivae normal.     Pupils: Pupils are equal, round, and reactive to light.  Neck:     Vascular: No carotid bruit.  Cardiovascular:     Rate and Rhythm: Normal rate and regular rhythm.     Pulses: Normal pulses.     Heart sounds: Normal heart sounds. No murmur heard.   No friction  rub. No gallop.  Pulmonary:     Effort: Pulmonary effort is normal. No respiratory distress.     Breath sounds: Normal breath sounds. No stridor. No wheezing, rhonchi or rales.  Chest:     Chest wall: No tenderness.  Abdominal:     General: Abdomen is flat. Bowel sounds are normal. There is no distension.     Palpations: Abdomen is soft. There is no mass.     Tenderness: There is no abdominal tenderness. There is no right CVA tenderness, left CVA tenderness, guarding or rebound.     Hernia: No hernia is present.  Musculoskeletal:        General: Tenderness (c spine, R shoulder, lumbar spine) present. No swelling, deformity or signs of injury. Normal range of motion.     Cervical back: Normal range of motion and neck supple. No rigidity or tenderness.     Right lower leg: No edema.     Left lower leg: No edema.  Lymphadenopathy:     Cervical: No cervical adenopathy.  Skin:    General: Skin is warm and dry.     Capillary Refill: Capillary refill takes less than 2 seconds.     Coloration: Skin is not jaundiced or pale.     Findings: No bruising, erythema, lesion or rash.  Neurological:     General: No focal deficit present.     Mental Status: He is alert and oriented to person, place, and time. Mental status is at baseline.     Cranial Nerves: No cranial nerve deficit.     Motor: No weakness.     Gait: Gait normal.  Psychiatric:        Mood and Affect: Mood normal.        Behavior: Behavior normal.        Thought Content: Thought content normal.        Judgment: Judgment normal.    BP 122/70  Pulse 88    Temp 98.3 F (36.8 C) (Temporal)    Resp 16    Ht 5\' 8"  (1.727 m)    Wt 169 lb 6.4 oz (76.8 kg)    SpO2 97%    BMI 25.76 kg/m  Wt Readings from Last 3 Encounters:  08/13/21 169 lb 6.4 oz (76.8 kg)  07/24/21 165 lb (74.8 kg)  06/25/21 165 lb (74.8 kg)     There are no preventive care reminders to display for this patient.  There are no preventive care reminders to  display for this patient.  No results found for: TSH Lab Results  Component Value Date   WBC 5.6 11/11/2020   HGB 13.9 11/11/2020   HCT 42.0 11/11/2020   MCV 89.4 11/11/2020   PLT 157 11/11/2020   Lab Results  Component Value Date   NA 136 11/11/2020   K 3.8 11/11/2020   CO2 29 11/11/2020   GLUCOSE 97 11/11/2020   BUN 14 11/11/2020   CREATININE 0.71 11/11/2020   BILITOT 0.9 08/20/2020   ALKPHOS 85 08/20/2020   AST 32 08/20/2020   ALT 15 08/20/2020   PROT 7.2 08/20/2020   ALBUMIN 3.4 (L) 08/20/2020   CALCIUM 8.6 (L) 11/11/2020   ANIONGAP 6 11/11/2020   Lab Results  Component Value Date   CHOL 97 11/10/2020   Lab Results  Component Value Date   HDL 52 11/10/2020   Lab Results  Component Value Date   LDLCALC 37 11/10/2020   Lab Results  Component Value Date   TRIG 40 11/10/2020   Lab Results  Component Value Date   CHOLHDL 1.9 11/10/2020   No results found for: HGBA1C    Assessment & Plan:   Problem List Items Addressed This Visit       Other   Bipolar depression ([place])   Relevant Orders   TSH   Other Visit Diagnoses     Annual physical exam    -  Primary   Screening for endocrine, metabolic and immunity disorder       Relevant Orders   Comprehensive metabolic panel   Hemoglobin A1c   CBC with Differential/Platelet   Lipid screening       Relevant Orders   Lipid panel   Sleep disturbance       Relevant Orders   TSH   Screening PSA (prostate specific antigen)       Relevant Orders   PSA, Medicare ( [facility] only)       No orders of the defined types were placed in this encounter.   Follow-up: Return in about 6 months (around 02/10/2022) for med check - alprazolam.   PLAN Exam unremarkable beyond known tenderness. He will continue to follow Dr. Lorin Mercy for this. Labs collected. Will follow up with the patient as warranted. Patient encouraged to call clinic with any questions, comments, or concerns.  Maximiano Coss, NP

## 2021-08-13 NOTE — Patient Instructions (Signed)
Mr. Kannon Granderson to see you  No concerns on exam (beyond the things we already know about, the shoulder, back, and neck).   Let's check labs.  See you in 6 mo to touch base regarding medications.  Call sooner if you need anything  Thank you  Rich

## 2021-08-14 ENCOUNTER — Encounter: Payer: Self-pay | Admitting: Physical Medicine and Rehabilitation

## 2021-08-14 ENCOUNTER — Ambulatory Visit: Payer: Self-pay

## 2021-08-14 ENCOUNTER — Telehealth: Payer: Self-pay | Admitting: Orthopaedic Surgery

## 2021-08-14 ENCOUNTER — Other Ambulatory Visit: Payer: Self-pay

## 2021-08-14 ENCOUNTER — Ambulatory Visit (INDEPENDENT_AMBULATORY_CARE_PROVIDER_SITE_OTHER): Payer: PPO | Admitting: Physical Medicine and Rehabilitation

## 2021-08-14 VITALS — BP 148/75 | HR 62

## 2021-08-14 DIAGNOSIS — M48062 Spinal stenosis, lumbar region with neurogenic claudication: Secondary | ICD-10-CM

## 2021-08-14 DIAGNOSIS — M5416 Radiculopathy, lumbar region: Secondary | ICD-10-CM | POA: Diagnosis not present

## 2021-08-14 MED ORDER — DEXAMETHASONE SODIUM PHOSPHATE 10 MG/ML IJ SOLN
15.0000 mg | Freq: Once | INTRAMUSCULAR | Status: AC
  Administered 2021-08-14: 15 mg

## 2021-08-14 NOTE — Progress Notes (Signed)
Paul Ray - 66 y.o. male MRN [id number]  Date of birth: Sep 02, 1955  Office Visit Note: Visit Date: 08/14/2021 PCP: Maximiano Coss, NP Referred by: Maximiano Coss, NP  Subjective: Chief Complaint  Patient presents with   Lower Back - Pain   HPI:  Paul Ray is a 66 y.o. male who comes in today at the request of Dr. Rodell Perna for planned Bilateral L3-4 Lumbar Transforaminal epidural steroid injection with fluoroscopic guidance.  The patient has failed conservative care including home exercise, medications, time and activity modification.  This injection will be diagnostic and hopefully therapeutic.  Please see requesting physician notes for further details and justification.  ROS Otherwise per HPI.  Assessment & Plan: Visit Diagnoses:    ICD-10-CM   1. Lumbar radiculopathy  M54.16 XR C-ARM NO REPORT    Epidural Steroid injection    dexamethasone (DECADRON) injection 15 mg    2. Spinal stenosis of lumbar region with neurogenic claudication  M48.062 XR C-ARM NO REPORT    Epidural Steroid injection    dexamethasone (DECADRON) injection 15 mg      Plan: No additional findings.   Meds & Orders:  Meds ordered this encounter  Medications   dexamethasone (DECADRON) injection 15 mg    Orders Placed This Encounter  Procedures   XR C-ARM NO REPORT   Epidural Steroid injection    Follow-up: Return in about 2 weeks (around 08/28/2021) for Rodell Perna, MD.   Procedures: No procedures performed  Lumbosacral Transforaminal Epidural Steroid Injection - Sub-Pedicular Approach with Fluoroscopic Guidance  Patient: Paul Ray      Date of Birth: 1955-12-01 MRN: [id number] PCP: Maximiano Coss, NP      Visit Date: 08/14/2021   Universal Protocol:    Date/Time: 08/14/2021  Consent Given By: the patient  Position: PRONE  Additional Comments: Vital signs were monitored before and after the procedure. Patient was prepped and draped in the usual sterile fashion. The correct  patient, procedure, and site was verified.   Injection Procedure Details:   Procedure diagnoses: Lumbar radiculopathy [M54.16]    Meds Administered:  Meds ordered this encounter  Medications   dexamethasone (DECADRON) injection 15 mg    Laterality: Bilateral  Location/Site: L3  Needle:5.0 in., 22 ga.  Short bevel or Quincke spinal needle  Needle Placement: Transforaminal  Findings:    -Comments: Excellent flow of contrast along the nerve, nerve root and into the epidural space.  Procedure Details: After squaring off the end-plates to get a true AP view, the C-arm was positioned so that an oblique view of the foramen as noted above was visualized. The target area is just inferior to the "nose of the scotty dog" or sub pedicular. The soft tissues overlying this structure were infiltrated with 2-3 ml. of 1% Lidocaine without Epinephrine.  The spinal needle was inserted toward the target using a "trajectory" view along the fluoroscope beam.  Under AP and lateral visualization, the needle was advanced so it did not puncture dura and was located close the 6 O'Clock position of the pedical in AP tracterory. Biplanar projections were used to confirm position. Aspiration was confirmed to be negative for CSF and/or blood. A 1-2 ml. volume of Isovue-250 was injected and flow of contrast was noted at each level. Radiographs were obtained for documentation purposes.   After attaining the desired flow of contrast documented above, a 0.5 to 1.0 ml test dose of 0.25% Marcaine was injected into each respective transforaminal space.  The  patient was observed for 90 seconds post injection.  After no sensory deficits were reported, and normal lower extremity motor function was noted,   the above injectate was administered so that equal amounts of the injectate were placed at each foramen (level) into the transforaminal epidural space.   Additional Comments:  The patient tolerated the procedure  well Dressing: 2 x 2 sterile gauze and Band-Aid    Post-procedure details: Patient was observed during the procedure. Post-procedure instructions were reviewed.  Patient left the clinic in stable condition.    Clinical History: MRI LUMBAR SPINE WITHOUT CONTRAST   TECHNIQUE: Multiplanar, multisequence MR imaging of the lumbar spine was performed. No intravenous contrast was administered.   COMPARISON:  Radiography 06/25/2021.  MRI 05/15/2011.   FINDINGS: Segmentation:  5 lumbar type vertebral bodies.   Alignment: Minimal scoliotic curvature convex to the left. One or 2 mm of degenerative anterolisthesis at L4-5.   Vertebrae:  No fracture or focal bone lesion.   Conus medullaris and cauda equina: Conus extends to the L1 level. Conus and cauda equina appear normal.   Paraspinal and other soft tissues: Negative   Disc levels:   T12-L1: Minimal noncompressive disc bulge.   L1-2: Bulging of the disc with a small left paracentral protrusion. Mild facet and ligamentous prominence. Mild multifactorial stenosis at this level but without distinct neural compression. Findings worsened since 20/12.   L2-3: Shallow circumferential protrusion of the disc. Facet and ligamentous hypertrophy. Severe multifactorial stenosis at this level that could cause neural compression. Findings have worsened since 20/12.   L3-4: Previous posterior decompression. Endplate osteophytes and broad-based herniation of disc material with slight caudal migration in the midline. Bilateral facet prominence. Severe multifactorial stenosis at this level despite the previous surgery. Neural compression likely.   L4-5: Previous posterior decompression. Bilateral facet osteoarthritis with 1 or 2 mm of anterolisthesis. Mild bulging of the disc. Stenosis of the lateral recesses and neural foramina but without definite neural compression.   L5-S1: Mild bulging of the disc. Facet and ligamentous  hypertrophy. Mild narrowing of the subarticular lateral recesses but without visible neural compression.   IMPRESSION: L3-4: Previous posterior decompression. Endplate osteophytes and broad-based protrusion of the disc, with some herniated disc material migrated caudally in the midline. Bilateral facet prominence. Severe multifactorial stenosis at this level despite the previous surgery, with neural compression likely at this level.   L2-3: Shallow circumferential disc protrusion. Facet and ligamentous hypertrophy. Multifactorial spinal stenosis that could be symptomatic.   L1-2: Disc bulge with shallow left paracentral disc herniation. Mild multifactorial stenosis but without definite neural compression.   L4-5: Previous posterior decompression. Facet degeneration with 1 or 2 mm of anterolisthesis. Narrowing of the lateral recesses and neural foramina but without definite neural compression.   L5-S1: Disc bulge and facet hypertrophy. Mild stenosis of the subarticular lateral recesses and foramina but without visible neural compression.     Electronically Signed   By: Nelson Chimes M.D.   On: 07/15/2021 11:42     Objective:  VS:  HT:     WT:    BMI:      BP:(!) 148/75   HR:62bpm   TEMP: ( )   RESP:  Physical Exam Vitals and nursing note reviewed.  Constitutional:      General: He is not in acute distress.    Appearance: Normal appearance. He is not ill-appearing.  HENT:     Head: Normocephalic and atraumatic.     Right Ear: External ear normal.  Left Ear: External ear normal.     Nose: No congestion.  Eyes:     Extraocular Movements: Extraocular movements intact.  Cardiovascular:     Rate and Rhythm: Normal rate.     Pulses: Normal pulses.  Pulmonary:     Effort: Pulmonary effort is normal. No respiratory distress.  Abdominal:     General: There is no distension.     Palpations: Abdomen is soft.  Musculoskeletal:        General: No tenderness or signs of  injury.     Cervical back: Neck supple.     Right lower leg: No edema.     Left lower leg: No edema.     Comments: Patient has good distal strength without clonus.  Skin:    Findings: No erythema or rash.  Neurological:     General: No focal deficit present.     Mental Status: He is alert and oriented to person, place, and time.     Sensory: No sensory deficit.     Motor: No weakness or abnormal muscle tone.     Coordination: Coordination normal.  Psychiatric:        Mood and Affect: Mood normal.        Behavior: Behavior normal.     Imaging: XR C-ARM NO REPORT  Result Date: 08/14/2021 Please see Notes tab for imaging impression.

## 2021-08-14 NOTE — Telephone Encounter (Signed)
Called pt and informed. He stated understanding

## 2021-08-14 NOTE — Progress Notes (Signed)
Pt state lower back pain. Pt state walking makes the pain worse. Pt state he takes over the counter pain worse to help ease his pain.  Numeric Pain Rating Scale and Functional Assessment Average Pain 9   In the last MONTH (on 0-10 scale) has pain interfered with the following?  1. General activity like being  able to carry out your everyday physical activities such as walking, climbing stairs, carrying groceries, or moving a chair?  Rating(10)   +Driver, -BT, -Dye Allergies.  

## 2021-08-14 NOTE — Telephone Encounter (Signed)
Spoke with patient he wanted to know if it's to soon for him to have an injection in his shoulder? Patient said he had an injection in his back with Dr. Ernestina Patches. The number to contact patient is [phone]

## 2021-08-14 NOTE — Patient Instructions (Signed)

## 2021-08-14 NOTE — Telephone Encounter (Signed)
Please advise 

## 2021-08-14 NOTE — Procedures (Signed)
Lumbosacral Transforaminal Epidural Steroid Injection - Sub-Pedicular Approach with Fluoroscopic Guidance  Patient: Paul Ray      Date of Birth: 1956-01-18 MRN: [id number] PCP: Maximiano Coss, NP      Visit Date: 08/14/2021   Universal Protocol:    Date/Time: 08/14/2021  Consent Given By: the patient  Position: PRONE  Additional Comments: Vital signs were monitored before and after the procedure. Patient was prepped and draped in the usual sterile fashion. The correct patient, procedure, and site was verified.   Injection Procedure Details:   Procedure diagnoses: Lumbar radiculopathy [M54.16]    Meds Administered:  Meds ordered this encounter  Medications   dexamethasone (DECADRON) injection 15 mg    Laterality: Bilateral  Location/Site: L3  Needle:5.0 in., 22 ga.  Short bevel or Quincke spinal needle  Needle Placement: Transforaminal  Findings:    -Comments: Excellent flow of contrast along the nerve, nerve root and into the epidural space.  Procedure Details: After squaring off the end-plates to get a true AP view, the C-arm was positioned so that an oblique view of the foramen as noted above was visualized. The target area is just inferior to the "nose of the scotty dog" or sub pedicular. The soft tissues overlying this structure were infiltrated with 2-3 ml. of 1% Lidocaine without Epinephrine.  The spinal needle was inserted toward the target using a "trajectory" view along the fluoroscope beam.  Under AP and lateral visualization, the needle was advanced so it did not puncture dura and was located close the 6 O'Clock position of the pedical in AP tracterory. Biplanar projections were used to confirm position. Aspiration was confirmed to be negative for CSF and/or blood. A 1-2 ml. volume of Isovue-250 was injected and flow of contrast was noted at each level. Radiographs were obtained for documentation purposes.   After attaining the desired flow of contrast  documented above, a 0.5 to 1.0 ml test dose of 0.25% Marcaine was injected into each respective transforaminal space.  The patient was observed for 90 seconds post injection.  After no sensory deficits were reported, and normal lower extremity motor function was noted,   the above injectate was administered so that equal amounts of the injectate were placed at each foramen (level) into the transforaminal epidural space.   Additional Comments:  The patient tolerated the procedure well Dressing: 2 x 2 sterile gauze and Band-Aid    Post-procedure details: Patient was observed during the procedure. Post-procedure instructions were reviewed.  Patient left the clinic in stable condition.

## 2021-08-28 ENCOUNTER — Ambulatory Visit: Payer: PPO | Admitting: Orthopaedic Surgery

## 2021-09-02 ENCOUNTER — Telehealth: Payer: Self-pay | Admitting: Orthopedic Surgery

## 2021-09-02 NOTE — Telephone Encounter (Signed)
Please advise 

## 2021-09-02 NOTE — Telephone Encounter (Signed)
Patient called. He would like someone to call him about the shoulder surgery recovery. His call back number is [phone]

## 2021-09-03 NOTE — Telephone Encounter (Signed)
Pt called again. Please advise below.   CB [phone]

## 2021-09-04 NOTE — Telephone Encounter (Signed)
I called pls set up to see in clinic thx

## 2021-09-05 ENCOUNTER — Other Ambulatory Visit: Payer: Self-pay

## 2021-09-05 ENCOUNTER — Ambulatory Visit (INDEPENDENT_AMBULATORY_CARE_PROVIDER_SITE_OTHER): Payer: PPO | Admitting: Orthopedic Surgery

## 2021-09-05 DIAGNOSIS — M25511 Pain in right shoulder: Secondary | ICD-10-CM

## 2021-09-06 ENCOUNTER — Encounter: Payer: Self-pay | Admitting: Orthopedic Surgery

## 2021-09-06 DIAGNOSIS — F411 Generalized anxiety disorder: Secondary | ICD-10-CM | POA: Diagnosis not present

## 2021-09-06 DIAGNOSIS — F319 Bipolar disorder, unspecified: Secondary | ICD-10-CM | POA: Diagnosis not present

## 2021-09-06 NOTE — Progress Notes (Signed)
Office Visit Note   Patient: KATHLEEN LIKINS           Date of Birth: 09/30/1955           MRN: [id number] Visit Date: 09/05/2021 Requested by: Maximiano Coss, NP [address] [place],  [place] [address] PCP: Maximiano Coss, NP  Subjective: Chief Complaint  Patient presents with   Right Shoulder - Pain    HPI: Kaleth is a 66 year old patient with right shoulder arthritis.  He has a history of an AC joint injury.  Has known history of arthritis as well.  Last seen a couple of years ago.  Pain is becoming more severe.  Interfering with activities of daily living.  Although retired he is very active and taking care of his property which can involve lifting boards and other heavy objects at times.              ROS: All systems reviewed are negative as they relate to the chief complaint within the history of present illness.  Patient denies  fevers or chills.   Assessment & Plan: Visit Diagnoses:  1. Right shoulder pain, unspecified chronicity     Plan: Impression is 66 year old patient with right shoulder arthritis.  Has pretty well-maintained range of motion.  Look through the shoulder with ultrasound today to better evaluate the rotator cuff and it does look like he has at least some partial-thickness tearing of the supraspinatus.  Plan at this time is thin cut CT scan to evaluate for patient specific instrumentation.  We may need to make the decision of anatomic versus reverse shoulder replacement at the time of surgery depending on the status of the rotator cuff.  Ideally would like to do convertible glenoid.  If anatomic replacement is considered.  Follow-up after CT scanning.  Follow-Up Instructions: Return for after MRI.   Orders:  Orders Placed This Encounter  Procedures   CT SHOULDER RIGHT WO CONTRAST   No orders of the defined types were placed in this encounter.     Procedures: No procedures performed   Clinical Data: No additional findings.  Objective: Vital  Signs: There were no vitals taken for this visit.  Physical Exam:   Constitutional: Patient appears well-developed HEENT:  Head: Normocephalic Eyes:EOM are normal Neck: Normal range of motion Cardiovascular: Normal rate Pulmonary/chest: Effort normal Neurologic: Patient is alert Skin: Skin is warm Psychiatric: Patient has normal mood and affect   Ortho Exam: Ortho exam demonstrates good cervical spine range of motion.  Patient has 5 out of 5 grip EPL FPL interosseous restriction extension bicep triceps and deltoid strength.  Rotator cuff strength is pretty team reasonable to infraspinatus infraspinatus and subscap muscle testing but it is painful for him.  Passive range of motion is 50/95/175.  Deltoid is functional.  AC joint grade 3 through 5 injury is present and nontender to palpation.  Specialty Comments:  No specialty comments available.  Imaging: No results found.   PMFS History: Patient Active Problem List   Diagnosis Date Noted   Spinal stenosis of lumbar region 07/25/2021   Foraminal stenosis of cervical region 03/08/2021   Viral pericarditis    Alcohol use disorder, severe, dependence ([place]) 12/06/2020   Chest pain 11/09/2020   Ascending aortic aneurysm    Benign essential HTN    Coronary artery calcification seen on CAT scan    Psychoactive substance-induced psychosis ([place])    Rib pain 07/01/2017   Closed fracture of one rib of left  side 07/01/2017   Chronic right shoulder pain 05/04/2017   Osteoarthritis of right hip 12/02/2016   Lumbar radiculopathy 04/17/2014   Chronic low back pain 08/16/2013   Lumbar post-laminectomy syndrome 08/16/2013   Lumbosacral radiculitis 08/16/2013   Impingement syndrome of right shoulder 07/05/2013   Empyema lung (HCC)    Abscess of lung(513.0)    Bipolar depression ([place])    ADD (attention deficit disorder)    Hepatitis C    Hip pain 05/02/2011   History of total hip arthroplasty 04/23/2011   Lung mass 10/22/2010    Pleural effusion 10/15/2010   Past Medical History:  Diagnosis Date   Abscess of lung(513.0)    Right lower lobe   ADD (attention deficit disorder)    Anxiety    Arthritis    Ascending aortic aneurysm    37mm by Chest CTA 05/2020 and 55mm by echo 05/2020   Benign essential HTN    Bipolar depression ([place])    Coronary artery calcification seen on CAT scan    Empyema lung ([place])    Hemorrhoids    Hepatitis C    Pneumonia    last year   Prostate abscess    Viral pericarditis    after COVID 19 infection    Family History  Problem Relation Age of Onset   COPD Mother    Heart disease Mother    Hypertension Mother    Coronary artery disease Father    Dementia Father    Heart failure Father    Prostate cancer Paternal Grandfather        also had bone cancer   Breast cancer Maternal Grandmother    Cancer Paternal Grandmother        unsure what kind    Past Surgical History:  Procedure Laterality Date   ELBOW SURGERY  2005   Right   FOOT NEUROMA SURGERY  2006   Left   HIP RESURFACING     [facility] hip [facility]   LUMBAR LAMINECTOMY/DECOMPRESSION MICRODISCECTOMY  07/24/2011   Procedure: LUMBAR LAMINECTOMY/DECOMPRESSION MICRODISCECTOMY;  Surgeon: Eustace Moore;  Location: [place] [facility];  Service: Neurosurgery;  Laterality: Bilateral;  Bilateral  , Lumbar Three-Four, Lumbar Four-Five Decompressive Laminectomy Rm # 32   LUNG SURGERY     for empyema   SHOULDER ARTHROSCOPY Right 07/05/2013   Procedure: RIGHT SHOULDER ARTHROSCOPY WITH DEBRIDEMENT;  Surgeon: Mcarthur Rossetti, MD;  Location: [facility];  Service: Orthopedics;  Laterality: Right;   TENDON TRANSFER Left 01/26/2014   Procedure: LEFT THUMB TRAPEZIECTOMY KNOTTED TENDON INTRAPOSITION TRANSFER OF ABDUCTOR POLLICIS LONGUS TO [place];  Surgeon: Cammie Sickle, MD;  Location: [facility];  Service: Orthopedics;  Laterality: Left;   THORACOTOMY  November 14 2010   rt   Social History   Occupational History    Occupation: Games developer  Tobacco Use   Smoking status: Never   Smokeless tobacco: Never  Vaping Use   Vaping Use: Never used  Substance and Sexual Activity   Alcohol use: No    Comment: quit drinking in 1989   Drug use: No   Sexual activity: Yes

## 2021-09-09 ENCOUNTER — Other Ambulatory Visit: Payer: Self-pay | Admitting: Registered Nurse

## 2021-09-26 ENCOUNTER — Ambulatory Visit
Admission: RE | Admit: 2021-09-26 | Discharge: 2021-09-26 | Disposition: A | Discharge status: Home / Self Care | Payer: PPO | Source: Ambulatory Visit | Attending: Orthopedic Surgery | Admitting: Orthopedic Surgery

## 2021-09-26 ENCOUNTER — Other Ambulatory Visit: Payer: Self-pay

## 2021-09-26 DIAGNOSIS — M25511 Pain in right shoulder: Secondary | ICD-10-CM

## 2021-09-26 NOTE — Progress Notes (Signed)
Hi Lauren can you refer Paul Ray to cardiothoracic surgery so they can look at his thoracic aneurysm which does not look operative but just needs to be followed thanks

## 2021-09-27 ENCOUNTER — Other Ambulatory Visit: Payer: Self-pay

## 2021-09-27 DIAGNOSIS — I729 Aneurysm of unspecified site: Secondary | ICD-10-CM

## 2021-09-30 ENCOUNTER — Other Ambulatory Visit: Payer: Self-pay

## 2021-09-30 ENCOUNTER — Ambulatory Visit: Payer: PPO | Admitting: Orthopedic Surgery

## 2021-09-30 DIAGNOSIS — M19011 Primary osteoarthritis, right shoulder: Secondary | ICD-10-CM

## 2021-10-04 ENCOUNTER — Encounter: Payer: Self-pay | Admitting: Orthopedic Surgery

## 2021-10-04 NOTE — Progress Notes (Signed)
? ?Office Visit Note ?  ?Patient: Paul Ray           ?Date of Birth: [birth year]           ?MRN: [id number] ?Visit Date: 09/30/2021 ?Requested by: Maximiano Coss, NP ?[address] ?[place],  [place] [address] ?PCP: Maximiano Coss, NP ? ?Subjective: ?Chief Complaint  ?Patient presents with  ?? Other  ?   ?Scan review  ? ? ?HPI: Paul Ray is a 66 y.o. male who presents to the office complaining of continued right shoulder pain.  Patient is here to review CT scan of the right shoulder.  Denies any change to his previous right shoulder complaints.  No new injury.Marland Kitchen   ?             ?ROS: All systems reviewed are negative as they relate to the chief complaint within the history of present illness.  Patient denies fevers or chills. ? ?Assessment & Plan: ?Visit Diagnoses:  ?1. Arthritis of right shoulder region   ? ? ?Plan: Patient is a 66 year old male who presents for review of CT scan of the right shoulder for preoperative planning purposes in anticipation of shoulder arthroplasty.  Reviewed the CT scan that demonstrates severe glenohumeral arthritis with decreased acromiohumeral interval.  With this as well as with the increased pain with rotator cuff strength testing on exam, anticipate that patient would most benefit from reverse shoulder arthroplasty instead of total shoulder arthroplasty.  Discussed the risks and benefits of shoulder arthroplasty including the risk of nerve/vessel damage, shoulder stiffness, shoulder instability, need for revision surgery, medical complication from surgery, prosthetic joint infection.  Also discussed the lifting restriction after surgery and the various activities that would be not a great idea after shoulder surgery.  He wants to be able to play pickup basketball, shoot basketballs, do dumbbell exercises, fly fish, practice martial arts.  Cautioned against striking with the operative shoulder in martial arts, grappling, pickup basketball.  Should be okay for shooting  basketball, dumbbell exercises under 20 pounds, flyfishing.  Debbie's card was given to him and he will call the office when he is ready to schedule surgery within the next several months. ? ?Follow-Up Instructions: No follow-ups on file.  ? ?Orders:  ?No orders of the defined types were placed in this encounter. ? ?No orders of the defined types were placed in this encounter. ? ? ? ? Procedures: ?No procedures performed ? ? ?Clinical Data: ?No additional findings. ? ?Objective: ?Vital Signs: There were no vitals taken for this visit. ? ?Physical Exam:  ?Constitutional: Patient appears well-developed ?HEENT:  ?Head: Normocephalic ?Eyes:EOM are normal ?Neck: Normal range of motion ?Cardiovascular: Normal rate ?Pulmonary/chest: Effort normal ?Neurologic: Patient is alert ?Skin: Skin is warm ?Psychiatric: Patient has normal mood and affect ? ?Ortho Exam: Ortho exam demonstrates right shoulder with 45 degrees external rotation, 90 degrees abduction, 170 degrees forward flexion.  Axillary nerve intact with deltoid firing.  Good rotator cuff strength of supra, infra, subscap.  Does cause pain on exam.  No significant tenderness throughout the axial cervical spine. ? ?Specialty Comments:  ?No specialty comments available. ? ?Imaging: ?No results found. ? ? ?PMFS History: ?Patient Active Problem List  ? Diagnosis Date Noted  ?? Spinal stenosis of lumbar region 07/25/2021  ?? Foraminal stenosis of cervical region 03/08/2021  ?? Viral pericarditis   ?? Alcohol use disorder, severe, dependence ([place]) 12/06/2020  ?? Chest pain 11/09/2020  ?? Ascending aortic aneurysm   ??  Benign essential HTN   ?? Coronary artery calcification seen on CAT scan   ?? Psychoactive substance-induced psychosis ([place])   ?? Rib pain 07/01/2017  ?? Closed fracture of one rib of left side 07/01/2017  ?? Chronic right shoulder pain 05/04/2017  ?? Osteoarthritis of right hip 12/02/2016  ?? Lumbar radiculopathy 04/17/2014  ?? Chronic low back pain 08/16/2013   ?? Lumbar post-laminectomy syndrome 08/16/2013  ?? Lumbosacral radiculitis 08/16/2013  ?? Impingement syndrome of right shoulder 07/05/2013  ?? Empyema lung ([place])   ?? Abscess of lung(513.0)   ?? Bipolar depression ([place])   ?? ADD (attention deficit disorder)   ?? Hepatitis C   ?? Hip pain 05/02/2011  ?? History of total hip arthroplasty 04/23/2011  ?? Lung mass 10/22/2010  ?? Pleural effusion 10/15/2010  ? ?Past Medical History:  ?Diagnosis Date  ?? Abscess of lung(513.0)   ? Right lower lobe  ?? ADD (attention deficit disorder)   ?? Anxiety   ?? Arthritis   ?? Ascending aortic aneurysm   ? 6m by Chest CTA 05/2020 and 440mby echo 05/2020  ?? Benign essential HTN   ?? Bipolar depression ([facility]  ?? Coronary artery calcification seen on CAT scan   ?? Empyema lung ([facility]  ?? Hemorrhoids   ?? Hepatitis C   ?? Pneumonia   ? last year  ?? Prostate abscess   ?? Viral pericarditis   ? after COVID 19 infection  ?  ?Family History  ?Problem Relation Age of Onset  ?? COPD Mother   ?? Heart disease Mother   ?? Hypertension Mother   ?? Coronary artery disease Father   ?? Dementia Father   ?? Heart failure Father   ?? Prostate cancer Paternal Grandfather   ?     also had bone cancer  ?? Breast cancer Maternal Grandmother   ?? Cancer Paternal Grandmother   ?     unsure what kind  ?  ?Past Surgical History:  ?Procedure Laterality Date  ?? ELBOW SURGERY  2005  ? Right  ?? FOOT NEUROMA SURGERY  2006  ? Left  ?? [facility]    [facility]  ?? LUMBAR LAMINECTOMY/DECOMPRESSION MICRODISCECTOMY  07/24/2011  ? Procedure: LUMBAR LAMINECTOMY/DECOMPRESSION MICRODISCECTOMY;  Surgeon: DaEustace Moore [facility];  Service: Neurosurgery;  Laterality: Bilateral;  Bilateral  , Lumbar Three-Four, Lumbar Four-Five Decompressive Laminectomy ?Rm # 32  ?? LUNG SURGERY    ? for empyema  ?? SHOULDER ARTHROSCOPY Right 07/05/2013  ? Procedure: RIGHT SHOULDER ARTHROSCOPY WITH DEBRIDEMENT;  Surgeon: ChMcarthur RossettiMD;   [facility];  Laterality: Right;  ?? TENDON TRANSFER Left 01/26/2014  ? Procedure: LEFT THUMB TRAPEZIECTOMY KNOTTED TENDON INTRAPOSITION TRANSFER OF ABDUCTOR POLLICIS LONGUS TO [place] Surgeon: RoCammie SickleMD;  [facility]: [facility];  Laterality: Left;  ?? THORACOTOMY  November 14 2010  ? rt  ? ?Social History  ? ?Occupational History  ?? Occupation: caGames developer?Tobacco Use  ?? Smoking status: Never  ?? Smokeless tobacco: Never  ?Vaping Use  ?? Vaping Use: Never used  ?Substance and Sexual Activity  ?? Alcohol use: No  ?  Comment: quit drinking in 1989  ?? Drug use: No  ?? Sexual activity: Yes  ? ? ? ? ?  ?

## 2021-10-16 ENCOUNTER — Telehealth: Payer: Self-pay

## 2021-10-16 NOTE — Telephone Encounter (Signed)
LAST APPOINTMENT DATE:  08/13/21  ? ?NEXT APPOINTMENT DATE:  ? ?MEDICATION: alprazolam (XANAX) 2 MG tablet ? ?Is the patient out of medication? no ? ?PHARMACY: [id number] [place] [place] - [address] [place]? ? ? ? ? ? ?

## 2021-10-18 ENCOUNTER — Other Ambulatory Visit: Payer: Self-pay | Admitting: Registered Nurse

## 2021-10-18 DIAGNOSIS — G479 Sleep disorder, unspecified: Secondary | ICD-10-CM

## 2021-10-18 MED ORDER — ALPRAZOLAM 2 MG PO TABS: 2.0000 mg | ORAL_TABLET | Freq: Two times a day (BID) | ORAL | 0 refills | Status: DC | PRN

## 2021-10-18 NOTE — Telephone Encounter (Signed)
[facility] is stating that [facility] not [facility]  ?

## 2021-10-18 NOTE — Telephone Encounter (Signed)
Patient is aware 

## 2021-10-18 NOTE — Telephone Encounter (Signed)
Refill sent, ? ?Thanks, ? ?Rich

## 2021-10-22 ENCOUNTER — Telehealth: Payer: Self-pay | Admitting: Orthopedic Surgery

## 2021-10-22 NOTE — Telephone Encounter (Signed)
Please call the pt he has questions regarding shoulder surgery and other stuff  ?

## 2021-10-22 NOTE — Telephone Encounter (Signed)
He will see them before surgery on shoulder

## 2021-10-23 NOTE — Telephone Encounter (Signed)
Dr Marlou Sa called and discussed with patient.  ? ?

## 2021-10-30 ENCOUNTER — Ambulatory Visit (INDEPENDENT_AMBULATORY_CARE_PROVIDER_SITE_OTHER): Payer: PPO | Admitting: Family Medicine

## 2021-10-30 ENCOUNTER — Other Ambulatory Visit: Payer: Self-pay

## 2021-10-30 ENCOUNTER — Encounter: Payer: Self-pay | Admitting: Family Medicine

## 2021-10-30 VITALS — BP 132/64 | HR 60 | Temp 98.1°F | Resp 18 | Ht 68.0 in | Wt 161.8 lb

## 2021-10-30 DIAGNOSIS — R351 Nocturia: Secondary | ICD-10-CM | POA: Diagnosis not present

## 2021-10-30 DIAGNOSIS — R350 Frequency of micturition: Secondary | ICD-10-CM

## 2021-10-30 LAB — POCT URINALYSIS DIP (MANUAL ENTRY)
Bilirubin, UA: NEGATIVE
Blood, UA: NEGATIVE
Glucose, UA: NEGATIVE mg/dL
Ketones, POC UA: NEGATIVE mg/dL
Leukocytes, UA: NEGATIVE
Nitrite, UA: NEGATIVE
Protein Ur, POC: NEGATIVE mg/dL
Spec Grav, UA: 1.01 (ref 1.010–1.025)
Urobilinogen, UA: 0.2 E.U./dL
pH, UA: 5 (ref 5.0–8.0)

## 2021-10-30 NOTE — Progress Notes (Signed)
? ?Subjective:  ?Patient ID: Paul Ray, male    DOB: 09/22/1955  Age: 66 y.o. MRN: [id number] ? ?CC:  ?Chief Complaint  ?Patient presents with  ? Med Change Request  ?  Patient states he would like to discuss Flomax. Patient states he has been on this medication for years and he feels the medication is no longer working. ?  ? ? ?HPI ?Paul Ray presents for  ? ? ?Takes flomax 0.'8mg'$  qd for years. Worked great initially for urinary frequency, nocturia. Initially one pill, then second pill few years later. ?Past 6 months to a year - more frequent urination, nocturia - 2 times per night. Some dribbling. ?No dysuria, no fever. No penile discharge. No testicular pain. Same partner past year. No hx of STI. No fever, abd pain. Neg STI screening in 03/2021 ?Urology - Dr. Louis Meckel. Treated for ED. Has not talked to urology  ?Takes viagra '100mg'$  as needed. Considering daily med.  ? ?Lab Results  ?Component Value Date  ? PSA 1.01 08/13/2021  ? ? ?History ?Patient Active Problem List  ? Diagnosis Date Noted  ? Spinal stenosis of lumbar region 07/25/2021  ? Foraminal stenosis of cervical region 03/08/2021  ? Viral pericarditis   ? Alcohol use disorder, severe, dependence ([place]) 12/06/2020  ? Chest pain 11/09/2020  ? Ascending aortic aneurysm ([place])   ? Benign essential HTN   ? Coronary artery calcification seen on CAT scan   ? Psychoactive substance-induced psychosis ([place])   ? Rib pain 07/01/2017  ? Closed fracture of one rib of left side 07/01/2017  ? Chronic right shoulder pain 05/04/2017  ? Osteoarthritis of right hip 12/02/2016  ? Lumbar radiculopathy 04/17/2014  ? Chronic low back pain 08/16/2013  ? Lumbar post-laminectomy syndrome 08/16/2013  ? Lumbosacral radiculitis 08/16/2013  ? Impingement syndrome of right shoulder 07/05/2013  ? Empyema lung ([place])   ? Abscess of lung(513.0)   ? Bipolar depression ([place])   ? ADD (attention deficit disorder)   ? Hepatitis C   ? Hip pain 05/02/2011  ? History of total hip arthroplasty  04/23/2011  ? Lung mass 10/22/2010  ? Pleural effusion 10/15/2010  ? ?Past Medical History:  ?Diagnosis Date  ? Abscess of lung(513.0)   ? Right lower lobe  ? ADD (attention deficit disorder)   ? Anxiety   ? Arthritis   ? Ascending aortic aneurysm   ? 22m by Chest CTA 05/2020 and 438mby echo 05/2020  ? Benign essential HTN   ? Bipolar depression ([facility]  ? Coronary artery calcification seen on CAT scan   ? Empyema lung ([facility]  ? Hemorrhoids   ? Hepatitis C   ? Pneumonia   ? last year  ? Prostate abscess   ? Viral pericarditis   ? after COVID 19 infection  ? ?Past Surgical History:  ?Procedure Laterality Date  ? ELBOW SURGERY  2005  ? Right  ? FOOT NEUROMA SURGERY  2006  ? Left  ? [facility]FACING    [facility]  ? LUMBAR LAMINECTOMY/DECOMPRESSION MICRODISCECTOMY  07/24/2011  ? Procedure: LUMBAR LAMINECTOMY/DECOMPRESSION MICRODISCECTOMY;  Surgeon: DaEustace Moore [facility];  Service: Neurosurgery;  Laterality: Bilateral;  Bilateral  , Lumbar Three-Four, Lumbar Four-Five Decompressive Laminectomy ?Rm # 32  ? LUNG SURGERY    ? for empyema  ? SHOULDER ARTHROSCOPY Right 07/05/2013  ? Procedure: RIGHT SHOULDER ARTHROSCOPY WITH DEBRIDEMENT;  Surgeon: ChMcarthur RossettiMD;  [facility];  Service: Orthopedics;  Laterality: Right;  ? TENDON TRANSFER Left 01/26/2014  ? Procedure: LEFT THUMB TRAPEZIECTOMY KNOTTED TENDON INTRAPOSITION TRANSFER OF ABDUCTOR POLLICIS LONGUS TO [place];  Surgeon: Cammie Sickle, MD;  Location: [facility];  Service: Orthopedics;  Laterality: Left;  ? THORACOTOMY  November 14 2010  ? rt  ? ?Allergies  ?Allergen Reactions  ? Quinolones Other (See Comments)  ?  Patient was warned about not using Cipro and similar antibiotics. ?Recent studies have raised concern that fluoroquinolone antibiotics could be associated with an increased risk of aortic aneurysm ?Fluoroquinolones have non-antimicrobial properties that might jeopardise the integrity of the  extracellular matrix of the vascular wall ?In a  propensity score matched cohort study in [place], there was a 66% increased rate of aortic aneurysm or dissection associated with oral fluoroquinolone use, compared wit  ? ?Prior to Admission medications   ?Medication Sig Start Date End Date Taking? Authorizing Provider  ?alprazolam (XANAX) 2 MG tablet TAKE 1 TABLET (2 MG TOTAL) BY MOUTH 2 (TWO) TIMES DAILY AS NEEDED FOR SLEEP. 10/21/21  Yes Maximiano Coss, NP  ?lamoTRIgine (LAMICTAL) 200 MG tablet Take 1 tablet (200 mg total) by mouth in the morning. 06/12/21  Yes Maximiano Coss, NP  ?sildenafil (VIAGRA) 50 MG tablet Take 50 mg by mouth daily as needed.   Yes [provider]  ?tamsulosin (FLOMAX) 0.4 MG CAPS capsule TAKE 2 CAPSULES BY MOUTH EVERY DAY 09/09/21  Yes Maximiano Coss, NP  ?sertraline (ZOLOFT) 100 MG tablet Take 0.5 tablets (50 mg total) by mouth daily. ?Patient not taking: Reported on 10/30/2021 06/25/21   Maximiano Coss, NP  ? ?Social History  ? ?Socioeconomic History  ? Marital status: Single  ?  Spouse name: Not on file  ? Number of children: 2  ? Years of education: Not on file  ? Highest education level: Not on file  ?Occupational History  ? Occupation: Games developer  ?Tobacco Use  ? Smoking status: Never  ? Smokeless tobacco: Never  ?Vaping Use  ? Vaping Use: Never used  ?Substance and Sexual Activity  ? Alcohol use: No  ?  Comment: quit drinking in 1989  ? Drug use: No  ? Sexual activity: Yes  ?Other Topics Concern  ? Not on file  ?Social History Narrative  ? 2 children: [name] and [name]  ? ?Social Determinants of Health  ? ?Financial Resource Strain: Not on file  ?Food Insecurity: Not on file  ?Transportation Needs: Not on file  ?Physical Activity: Not on file  ?Stress: Not on file  ?Social Connections: Not on file  ?Intimate Partner Violence: Not on file  ? ? ?Review of Systems ? ?Per hpi. ?Objective:  ? ?Vitals:  ? 10/30/21 0845  ?BP: 132/64  ?Pulse: 60  ?Resp: 18  ?Temp: 98.1 ?F (36.7 ?C)   ?TempSrc: Temporal  ?SpO2: 98%  ?Weight: 161 lb 12.8 oz (73.4 kg)  ?Height: '5\' 8"'$  (1.727 m)  ? ? ? ?Physical Exam ?Constitutional:   ?   General: He is not in acute distress. ?   Appearance: Normal appearance. He is well-developed.  ?HENT:  ?   Head: Normocephalic and atraumatic.  ?Cardiovascular:  ?   Rate and Rhythm: Normal rate.  ?Pulmonary:  ?   Effort: Pulmonary effort is normal.  ?Abdominal:  ?   General: Abdomen is flat. Bowel sounds are normal. There is no distension.  ?   Tenderness: There is no abdominal tenderness. There is no right CVA tenderness or  left CVA tenderness.  ?Genitourinary: ?   Penis: Normal. No discharge.   ?   Testes: Normal.     ?   Right: Tenderness not present.     ?   Left: Tenderness not present.  ?   Epididymis:  ?   Right: Normal. No tenderness.  ?   Left: Normal. No tenderness.  ?   Comments: DRE deferred, plans on having exam with urologist. ?Lymphadenopathy:  ?   Lower Body: No right inguinal adenopathy. No left inguinal adenopathy.  ?Neurological:  ?   Mental Status: He is alert and oriented to person, place, and time.  ?Psychiatric:     ?   Mood and Affect: Mood normal.  ? ?Results for orders placed or performed in visit on 10/30/21  ?POCT urinalysis dipstick  ?Result Value Ref Range  ? Color, UA yellow yellow  ? Clarity, UA clear clear  ? Glucose, UA negative negative mg/dL  ? Bilirubin, UA negative negative  ? Ketones, POC UA negative negative mg/dL  ? Spec Grav, UA 1.010 1.010 - 1.025  ? Blood, UA negative negative  ? pH, UA 5.0 5.0 - 8.0  ? Protein Ur, POC negative negative mg/dL  ? Urobilinogen, UA 0.2 0.2 or 1.0 E.U./dL  ? Nitrite, UA Negative Negative  ? Leukocytes, UA Negative Negative  ? ? ? ?Assessment & Plan:  ?Paul Ray is a 66 y.o. male . ?Urinary frequency - Plan: POCT urinalysis dipstick, Urine Microscopic ? ?Nocturia - Plan: POCT urinalysis dipstick, Urine Microscopic ?Urinary frequency, nocturia, possible BPH.  Had been treated with Flomax with increased  dose over the years.  Now less effective over the past 6 months to 1 year.  No new sexual partners since last STI screening that was negative.  No dysuria.  Most recent PSA normal.  Check point-of-care urinalysis, mic

## 2021-10-30 NOTE — Patient Instructions (Addendum)
Call Dr. Louis Meckel today to discuss options for the urinary symptoms, but continue flomax same dose for now. Instead of viagra, daily cialis may be option for erectile dysfunction and could potentially help prostate as well but would like you to discuss any changes with urology first.  ? ?Take care.  ? ?If you have lab work done today you will be contacted with your lab results within the next 2 weeks.  If you have not heard from [place] then please contact us. The fastest way to get your results is to register for My Chart. ? ? ?IF you received an x-ray today, you will receive an invoice from [facility]. Please contact [facility] at [phone] with questions or concerns regarding your invoice.  ? ?IF you received labwork today, you will receive an invoice from [facility]. Please contact [facility] at [phone] with questions or concerns regarding your invoice.  ? ?Our billing staff will not be able to assist you with questions regarding bills from these companies. ? ?You will be contacted with the lab results as soon as they are available. The fastest way to get your results is to activate your My Chart account. Instructions are located on the last page of this paperwork. If you have not heard from [place] regarding the results in 2 weeks, please contact this office. ?  ? ? ?

## 2021-10-31 LAB — URINALYSIS, MICROSCOPIC ONLY

## 2021-11-11 DIAGNOSIS — N401 Enlarged prostate with lower urinary tract symptoms: Secondary | ICD-10-CM | POA: Diagnosis not present

## 2021-11-11 DIAGNOSIS — R351 Nocturia: Secondary | ICD-10-CM | POA: Diagnosis not present

## 2021-11-11 DIAGNOSIS — R350 Frequency of micturition: Secondary | ICD-10-CM | POA: Diagnosis not present

## 2021-11-11 DIAGNOSIS — N3943 Post-void dribbling: Secondary | ICD-10-CM | POA: Diagnosis not present

## 2021-11-19 ENCOUNTER — Telehealth: Payer: Self-pay | Admitting: Registered Nurse

## 2021-11-19 NOTE — Telephone Encounter (Signed)
Patient Name Paul Ray ?Patient DOB 02/18/1956 ?Call Type Message Only Information Provided ?Reason for Call Request for General Office Information ?Initial Comment Caller states he needs a handicap placard and needs to know the protocol on how to get the ?information to take to [facility]. ?

## 2021-11-19 NOTE — Telephone Encounter (Signed)
Called pt and informed of process and pt has requested upon completion that this be mailed to him, I have attached a addressed envelope to the form and placed this in your to be signed  ?

## 2021-11-20 NOTE — Telephone Encounter (Signed)
I have completed this and placed it on Brooke's desk ? ?Thanks, ? ?Rich

## 2021-11-20 NOTE — Telephone Encounter (Signed)
I have placed this up front to be mailed out. ?

## 2021-11-29 DIAGNOSIS — N401 Enlarged prostate with lower urinary tract symptoms: Secondary | ICD-10-CM | POA: Diagnosis not present

## 2021-11-29 DIAGNOSIS — N5201 Erectile dysfunction due to arterial insufficiency: Secondary | ICD-10-CM | POA: Diagnosis not present

## 2021-11-29 DIAGNOSIS — R351 Nocturia: Secondary | ICD-10-CM | POA: Diagnosis not present

## 2021-12-09 NOTE — Telephone Encounter (Signed)
err

## 2021-12-13 ENCOUNTER — Ambulatory Visit: Payer: PPO | Admitting: Orthopedic Surgery

## 2021-12-14 ENCOUNTER — Other Ambulatory Visit: Payer: Self-pay | Admitting: Registered Nurse

## 2021-12-14 DIAGNOSIS — F319 Bipolar disorder, unspecified: Secondary | ICD-10-CM

## 2021-12-19 ENCOUNTER — Ambulatory Visit (INDEPENDENT_AMBULATORY_CARE_PROVIDER_SITE_OTHER): Payer: PPO | Admitting: Family Medicine

## 2021-12-19 ENCOUNTER — Ambulatory Visit: Payer: PPO | Admitting: Orthopaedic Surgery

## 2021-12-19 ENCOUNTER — Encounter: Payer: Self-pay | Admitting: Orthopaedic Surgery

## 2021-12-19 ENCOUNTER — Ambulatory Visit (INDEPENDENT_AMBULATORY_CARE_PROVIDER_SITE_OTHER): Payer: PPO

## 2021-12-19 VITALS — BP 114/60 | HR 72 | Temp 98.0°F | Resp 16 | Ht 68.0 in | Wt 164.0 lb

## 2021-12-19 DIAGNOSIS — F319 Bipolar disorder, unspecified: Secondary | ICD-10-CM | POA: Diagnosis not present

## 2021-12-19 DIAGNOSIS — M79642 Pain in left hand: Secondary | ICD-10-CM

## 2021-12-19 DIAGNOSIS — F988 Other specified behavioral and emotional disorders with onset usually occurring in childhood and adolescence: Secondary | ICD-10-CM

## 2021-12-19 NOTE — Progress Notes (Signed)
Subjective:  Patient ID: Paul Ray, male    DOB: 1956/07/07  Age: 66 y.o. MRN: [id number]  CC:  Chief Complaint  Patient presents with   ADHD    Pt would like to discuss if you can prescribe his ADHD medication, notes he does not want to see psychiatry due to some coverage issues     HPI Paul Ray presents for   ADHD/psychiatric Would like to discuss treatment for ADD here. Saw. Letta Moynahan in past. Then Dr. Toy Care, but then out of network. Had not seen in over a year.  Referred back to Dr. Toy Care 03/2021 - not accepting new patients.  Referred to [facility] 04/25/21 - did not want to be seen there. He may reconsider.  Referral to psychiatry 06/25/21 - saw Dr. Darleene Cleaver on 09/06/2021 with diagnoses of bipolar disorder and generalized anxiety disorder. Not a good visit. Did not feel professional, demeaning, loud. Felt talked down.  On lamictal for anxiety, Bipolar disorder.  Diagnosed 22 years ago - on adderall for many years. Not sure if adderall was working when he was taking pain meds after various surgeries. Off Adderall August of 2021. Decided to stop all controlled meds. Felt better coming of narcotics, but still scattered.  Longstanding use of the Mictral.  Some depression, stress at times.  Has appt with [facility] next week - testing will be hundreds of dollars.  Cost prohibitive.     12/19/2021   12:10 PM 10/30/2021    8:48 AM 08/13/2021    8:14 AM 05/21/2021    9:19 AM 04/11/2021   12:55 PM  Depression screen PHQ 2/9  Decreased Interest 0 0 0 0 0  Down, Depressed, Hopeless 0 0 0 0 0  PHQ - 2 Score 0 0 0 0 0  Altered sleeping  0  0 0  Tired, decreased energy  0  0 0  Change in appetite  0  0 0  Feeling bad or failure about yourself   0  0 0  Trouble concentrating  0  0 0  Moving slowly or fidgety/restless  0  0 0  Suicidal thoughts  0  0 0  PHQ-9 Score  0  0 0  Difficult doing work/chores  Not difficult at all  Not difficult at all Not difficult at  all    History Patient Active Problem List   Diagnosis Date Noted   Spinal stenosis of lumbar region 07/25/2021   Foraminal stenosis of cervical region 03/08/2021   Viral pericarditis    Alcohol use disorder, severe, dependence ([place]) 12/06/2020   Chest pain 11/09/2020   Ascending aortic aneurysm (HCC)    Benign essential HTN    Coronary artery calcification seen on CAT scan    Psychoactive substance-induced psychosis ([place])    Rib pain 07/01/2017   Closed fracture of one rib of left side 07/01/2017   Chronic right shoulder pain 05/04/2017   Osteoarthritis of right hip 12/02/2016   Lumbar radiculopathy 04/17/2014   Chronic low back pain 08/16/2013   Lumbar post-laminectomy syndrome 08/16/2013   Lumbosacral radiculitis 08/16/2013   Impingement syndrome of right shoulder 07/05/2013   Empyema lung (HCC)    Abscess of lung(513.0)    Bipolar depression ([place])    ADD (attention deficit disorder)    Hepatitis C    Hip pain 05/02/2011   History of total hip arthroplasty 04/23/2011   Lung mass 10/22/2010   Pleural effusion 10/15/2010   Past Medical History:  Diagnosis  Date   Abscess of lung(513.0)    Right lower lobe   ADD (attention deficit disorder)    Anxiety    Arthritis    Ascending aortic aneurysm (HCC)    73m by Chest CTA 05/2020 and 442mby echo 05/2020   Benign essential HTN    Bipolar depression ([facility]   Coronary artery calcification seen on CAT scan    Empyema lung ([facility]   Hemorrhoids    Hepatitis C    Pneumonia    last year   Prostate abscess    Viral pericarditis    after COVID 19 infection   Past Surgical History:  Procedure Laterality Date   ELBOW SURGERY  2005   Right   FOOT NEUROMA SURGERY  2006   Left   [facility]     [facility]   LUMBAR LAMINECTOMY/DECOMPRESSION MICRODISCECTOMY  07/24/2011   Procedure: LUMBAR LAMINECTOMY/DECOMPRESSION MICRODISCECTOMY;  Surgeon: DaEustace Moore [facility];  Service: Neurosurgery;   Laterality: Bilateral;  Bilateral  , Lumbar Three-Four, Lumbar Four-Five Decompressive Laminectomy Rm # 32   LUNG SURGERY     for empyema   SHOULDER ARTHROSCOPY Right 07/05/2013   Procedure: RIGHT SHOULDER ARTHROSCOPY WITH DEBRIDEMENT;  Surgeon: ChMcarthur RossettiMD;  [facility] Service: [facility]ics;  Laterality: Right;   TENDON TRANSFER Left 01/26/2014   Procedure: LEFT THUMB TRAPEZIECTOMY KNOTTED TENDON INTRAPOSITION TRANSFER OF ABDUCTOR POLLICIS LONGUS TO [facility] Surgeon: RoCammie SickleMD;  [facility] Service: Orth[facility]ics;  Laterality: Left;   THORACOTOMY  November 14 2010   rt   Allergies  Allergen Reactions   Quinolones Other (See Comments)    Patient was warned about not using Cipro and similar antibiotics. Recent studies have raised concern that fluoroquinolone antibiotics could be associated with an increased risk of aortic aneurysm Fluoroquinolones have non-antimicrobial properties that might jeopardise the integrity of the extracellular matrix of the vascular wall In a  propensity score matched cohort study in [place] was a 66% increased rate of aortic aneurysm or dissection associated with oral fluoroquinolone use, compared wit   Prior to Admission medications   Medication Sig Start Date End Date Taking? Authorizing Provider  alprazolam (XANAX) 2 MG tablet TAKE 1 TABLET (2 MG TOTAL) BY MOUTH 2 (TWO) TIMES DAILY AS NEEDED FOR SLEEP. 10/21/21  Yes MoMaximiano CossNP  lamoTRIgine (LAMICTAL) 200 MG tablet TAKE 1 TABLET (200 MG TOTAL) BY MOUTH IN THE MORNING. 12/16/21  Yes MoMaximiano CossNP  sildenafil (VIAGRA) 50 MG tablet Take 50 mg by mouth daily as needed.    [provider]   Social History   Socioeconomic History   Marital status: Single    Spouse name: Not on file   Number of children: 2   Years of education: Not on file   Highest education level: Not on file  Occupational History   Occupation: carpenter   Tobacco Use   Smoking status: Never   Smokeless tobacco: Never  Vaping Use   Vaping Use: Never used  Substance and Sexual Activity   Alcohol use: No    Comment: quit drinking in 1989   Drug use: No   Sexual activity: Yes  Other Topics Concern   Not on file  Social History Narrative   2 children: RaApolonio Schneidersnd [name]   Social Determinants of Health   Financial Resource Strain: Not on file  Food Insecurity: Not on file  Transportation Needs: Not on file  Physical Activity: Not on file  Stress: Not on file  Social Connections: Not on file  Intimate Partner Violence: Not on file    Review of Systems Per HPI   Objective:   Vitals:   12/19/21 1035  BP: 114/60  Pulse: 72  Resp: 16  Temp: 98 F (36.7 C)  TempSrc: Temporal  SpO2: 98%  Weight: 164 lb (74.4 kg)  Height: '5\' 8"'$  (1.727 m)     Physical Exam Vitals reviewed.  Constitutional:      General: He is not in acute distress.    Appearance: Normal appearance. He is well-developed.  HENT:     Head: Normocephalic and atraumatic.  Cardiovascular:     Rate and Rhythm: Normal rate.  Pulmonary:     Effort: Pulmonary effort is normal.  Neurological:     Mental Status: He is alert and oriented to person, place, and time.  Psychiatric:        Mood and Affect: Mood normal.     Comments: Appropriately agitated with situation and difficulty with medications, but appropriate responses, good eye contact, does not appear to be responding to internal stimuli.       Assessment & Plan:  Paul Ray is a 66 y.o. male . Bipolar depression ([place])  Attention deficit disorder, unspecified hyperactivity presence History of bipolar depression, treated with Lamictal, previously under care of psychiatry.  Referrals have been placed by Maximiano Coss, was seen by 1 psychiatrist but did not have a good visit.  Potential cost for ADD testing with [place] [facility] are prohibitive.  Initially did not want to follow back up  with [facility] but did note later in the visit he may consider this.  Will discuss with Richard as he is current PCP to decide on next step.  Evaluation with psychiatry may be best option for ongoing care including for bipolar.  No new medications prescribed at this time.  No orders of the defined types were placed in this encounter.  Patient Instructions  I am sorry to hear of the bad experience at the most recent psychiatry appointment.  Let me talk to your PCP and we can come up with a plan on the next step but I do think that meeting with a psychiatrist to review your medications and discuss change in meds including possible ADD meds may be the best bet.  Again I will talk to [place] and we will be in contact with you.  Hang in there.    Signed,   Merri Ray, MD [place] 12/20/21 1:16 PM

## 2021-12-19 NOTE — Patient Instructions (Signed)
I am sorry to hear of the bad experience at the most recent psychiatry appointment.  Let me talk to your PCP and we can come up with a plan on the next step but I do think that meeting with a psychiatrist to review your medications and discuss change in meds including possible ADD meds may be the best bet.  Again I will talk to [place] and we will be in contact with you.  Hang in there.

## 2021-12-19 NOTE — Progress Notes (Signed)
The patient comes in today for evaluation treatment of left hand pain especially of his digits after playing basketball and "jamming them" about 2 months ago.  He still been playing basketball.  He is an active 66 year old gentleman.  Unknown for a long period of time.  He says most of his pain is in the third and fourth knuckles.  He has remote history of a previous trapeziectomy at the base of his thumb done by one of the hand surgeons in town back in 2015.  He is right-hand dominant.  He does have arthritic changes of the DIP joints of his left hand.  There is old well-healed incisions at the base of the thumb.  His hand is well-perfused and he has no rotational deformities of the digits.  He is able to make a full composite fist.  There is no swelling of his hand.  3 views of the left hand show previous surgery at the base of the thumb but otherwise arthritic changes at the DIP joints but no acute findings and no fracture.  I gave him reassurance that most of his pain is likely just from contusions to his hand and it is flared up arthritis.  I recommended activity modification and over-the-counter anti-inflammatories as well as even Voltaren gel topically.  Follow-up is as needed.

## 2021-12-20 ENCOUNTER — Encounter: Payer: Self-pay | Admitting: Family Medicine

## 2021-12-25 ENCOUNTER — Telehealth: Payer: Self-pay | Admitting: Registered Nurse

## 2021-12-25 NOTE — Telephone Encounter (Signed)
Per AVS note Dr Carlota Raspberry would be discussing with PCP Orland Mustard and be in contact, pt is now looking for an update on this

## 2021-12-25 NOTE — Telephone Encounter (Signed)
PT was here last Thursday. PT wants a update about his last appt.

## 2021-12-26 NOTE — Telephone Encounter (Signed)
Called pt, lvm.  We have determined that we need him to be managed by psychiatry for his mental health concerns, including bipolar disorder and ADHD.  I have recommended [facility], but would co-sign referral elsewhere if he preferred.  Thanks,  Denice Paradise

## 2022-01-07 ENCOUNTER — Ambulatory Visit: Payer: PPO | Admitting: Orthopaedic Surgery

## 2022-01-07 VITALS — BP 138/78 | HR 55

## 2022-01-07 DIAGNOSIS — M961 Postlaminectomy syndrome, not elsewhere classified: Secondary | ICD-10-CM | POA: Diagnosis not present

## 2022-01-07 DIAGNOSIS — M48061 Spinal stenosis, lumbar region without neurogenic claudication: Secondary | ICD-10-CM

## 2022-01-07 DIAGNOSIS — Z9889 Other specified postprocedural states: Secondary | ICD-10-CM

## 2022-01-08 DIAGNOSIS — Z9889 Other specified postprocedural states: Secondary | ICD-10-CM | POA: Insufficient documentation

## 2022-01-08 HISTORY — DX: Other specified postprocedural states: Z98.890

## 2022-01-08 NOTE — Progress Notes (Signed)
Office Visit Note   Patient: Paul Ray           Date of Birth: 1955-09-14           MRN: [id number] Visit Date: 01/07/2022              Requested by: Maximiano Coss, NP [address] [place],  [place] [id number] PCP: Maximiano Coss, NP   Assessment & Plan: Visit Diagnoses: 1.  History of lumbar decompression  2.  Multilevel lumbar spinal stenosis without claudication   Plan: Patient is multilevel lumbar stenosis with the previous surgery.  We discussed activities he should do such as walking avoiding repetitive bending turning twisting such as when he plays basketball.  He states every time he is played basketball he is paid for it afterwards and we reviewed his MRI scan done in December which demonstrates areas of stenosis.  We discussed possibility of needing repeat decompression multilevel fusion which would definitely limit his motion and could lead to adjacent level problems.  He be better off modifying his workout activity although this is not exactly what he like to do rather than end up needing a large lumbar surgery.  Follow-Up Instructions: Return if symptoms worsen or fail to improve.   Orders:  No orders of the defined types were placed in this encounter.  No orders of the defined types were placed in this encounter.     Procedures: No procedures performed   Clinical Data: No additional findings.   Subjective: Chief Complaint  Patient presents with   Lower Back - Follow-up    HPI 66 year old male returns with ongoing problems with back symptoms.  He had an epidural 06/04/2022 with Dr. Ernestina Patches and got great relief for short period of time several weeks.  He states he played some basketball and then had significant increase pain.  He states when he runs pain is worse and he recognizes that running is an activity he should avoid.  Patient has previous decompression surgery by Dr. Sherley Bounds and has some congenital short pedicles with stenosis at L2-3 but has  some areas of severe multifactorial stenosis at L3-4 despite previous surgery.  2 mm anterolisthesis with lateral recess narrowing L4-5 and some mild stenosis at L5-S1.  Past problems with hip arthritis bipolar disorder alcohol use disorder psychoactive substance induced psychosis, lung empyema coronary artery disease.  Review of Systems all of systems noncontributory to HPI.   Objective: Vital Signs: BP 138/78   Pulse (!) 55   Physical Exam Constitutional:      Appearance: He is well-developed.  HENT:     Head: Normocephalic and atraumatic.     Right Ear: External ear normal.     Left Ear: External ear normal.  Eyes:     Pupils: Pupils are equal, round, and reactive to light.  Neck:     Thyroid: No thyromegaly.     Trachea: No tracheal deviation.  Cardiovascular:     Rate and Rhythm: Normal rate.  Pulmonary:     Effort: Pulmonary effort is normal.     Breath sounds: No wheezing.  Abdominal:     General: Bowel sounds are normal.     Palpations: Abdomen is soft.  Musculoskeletal:     Cervical back: Neck supple.  Skin:    General: Skin is warm and dry.     Capillary Refill: Capillary refill takes less than 2 seconds.  Neurological:     Mental Status: He is alert and  oriented to person, place, and time.  Psychiatric:        Behavior: Behavior normal.        Thought Content: Thought content normal.        Judgment: Judgment normal.     Ortho Exam no focal deficit well-healed lumbar incision.  Bilateral hip resurfacing incisions.  Anterior tib gastrocsoleus is active.  He can get from sitting to standing can heel and toe walk.  Specialty Comments:  No specialty comments available.  Imaging: No results found.   PMFS History: Patient Active Problem List   Diagnosis Date Noted   History of lumbar laminectomy for spinal cord decompression 01/08/2022   Spinal stenosis of lumbar region 07/25/2021   Foraminal stenosis of cervical region 03/08/2021   Viral pericarditis     Alcohol use disorder, severe, dependence ([place]) 12/06/2020   Chest pain 11/09/2020   Ascending aortic aneurysm (HCC)    Benign essential HTN    Coronary artery calcification seen on CAT scan    Psychoactive substance-induced psychosis ([place])    Rib pain 07/01/2017   Closed fracture of one rib of left side 07/01/2017   Chronic right shoulder pain 05/04/2017   Osteoarthritis of right hip 12/02/2016   Lumbar radiculopathy 04/17/2014   Chronic low back pain 08/16/2013   Lumbar post-laminectomy syndrome 08/16/2013   Lumbosacral radiculitis 08/16/2013   Impingement syndrome of right shoulder 07/05/2013   Empyema lung (HCC)    Abscess of lung(513.0)    Bipolar depression ([place])    ADD (attention deficit disorder)    Hepatitis C    Hip pain 05/02/2011   History of total hip arthroplasty 04/23/2011   Lung mass 10/22/2010   Pleural effusion 10/15/2010   Past Medical History:  Diagnosis Date   Abscess of lung(513.0)    Right lower lobe   ADD (attention deficit disorder)    Anxiety    Arthritis    Ascending aortic aneurysm (HCC)    85m by Chest CTA 05/2020 and 466mby echo 05/2020   Benign essential HTN    Bipolar depression ([facility]   Coronary artery calcification seen on CAT scan    Empyema lung ([facility]   Hemorrhoids    Hepatitis C    Pneumonia    last year   Prostate abscess    Viral pericarditis    after COVID 19 infection    Family History  Problem Relation Age of Onset   COPD Mother    Heart disease Mother    Hypertension Mother    Coronary artery disease Father    Dementia Father    Heart failure Father    Prostate cancer Paternal Grandfather        also had bone cancer   Breast cancer Maternal Grandmother    Cancer Paternal Grandmother        unsure what kind    Past Surgical History:  Procedure Laterality Date   ELBOW SURGERY  2005   Right   FOOT NEUROMA SURGERY  2006   Left   [facility]     [facility]   LUMBAR  LAMINECTOMY/DECOMPRESSION MICRODISCECTOMY  07/24/2011   Procedure: LUMBAR LAMINECTOMY/DECOMPRESSION MICRODISCECTOMY;  Surgeon: DaEustace Moore [facility];  Service: Neurosurgery;  Laterality: Bilateral;  Bilateral  , Lumbar Three-Four, Lumbar Four-Five Decompressive Laminectomy Rm # 32   LUNG SURGERY     for empyema   SHOULDER ARTHROSCOPY Right 07/05/2013   Procedure: RIGHT SHOULDER ARTHROSCOPY WITH DEBRIDEMENT;  Surgeon: Mcarthur Rossetti, MD;  Location: [facility];  Service: Orthopedics;  Laterality: Right;   TENDON TRANSFER Left 01/26/2014   Procedure: LEFT THUMB TRAPEZIECTOMY KNOTTED TENDON INTRAPOSITION TRANSFER OF ABDUCTOR POLLICIS LONGUS TO [place];  Surgeon: Cammie Sickle, MD;  Location: [facility];  Service: Orthopedics;  Laterality: Left;   THORACOTOMY  November 14 2010   rt   Social History   Occupational History   Occupation: Games developer  Tobacco Use   Smoking status: Never   Smokeless tobacco: Never  Vaping Use   Vaping Use: Never used  Substance and Sexual Activity   Alcohol use: No    Comment: quit drinking in 1989   Drug use: No   Sexual activity: Yes

## 2022-01-09 ENCOUNTER — Telehealth: Payer: Self-pay | Admitting: Radiology

## 2022-01-09 ENCOUNTER — Ambulatory Visit: Payer: PPO | Admitting: Surgery

## 2022-01-09 ENCOUNTER — Telehealth: Payer: Self-pay | Admitting: Orthopaedic Surgery

## 2022-01-09 ENCOUNTER — Other Ambulatory Visit: Payer: Self-pay | Admitting: Radiology

## 2022-01-09 MED ORDER — PREDNISONE 5 MG (21) PO TBPK: ORAL_TABLET | ORAL | 0 refills | Status: DC

## 2022-01-09 NOTE — Telephone Encounter (Signed)
error 

## 2022-01-10 ENCOUNTER — Telehealth: Payer: Self-pay | Admitting: Physical Medicine and Rehabilitation

## 2022-01-10 NOTE — Telephone Encounter (Signed)
Pt called requesting a call back. Pt is asking for an appt. Please call pt [phone]

## 2022-01-13 NOTE — Telephone Encounter (Signed)
Patient called in asking about setting appt for injection please advise.

## 2022-01-15 NOTE — Telephone Encounter (Signed)
Tried calling-no answer. LMVM advising per Dr Lorin Mercy

## 2022-01-16 ENCOUNTER — Other Ambulatory Visit: Payer: Self-pay

## 2022-01-16 DIAGNOSIS — G479 Sleep disorder, unspecified: Secondary | ICD-10-CM

## 2022-01-16 MED ORDER — ALPRAZOLAM 2 MG PO TABS: 2.0000 mg | ORAL_TABLET | Freq: Two times a day (BID) | ORAL | 0 refills | Status: DC | PRN

## 2022-01-16 NOTE — Telephone Encounter (Signed)
Patient is requesting a refill of the following medications: Requested Prescriptions   Pending Prescriptions Disp Refills   alprazolam (XANAX) 2 MG tablet 180 tablet 0    Sig: Take 1 tablet (2 mg total) by mouth 2 (two) times daily as needed for sleep.  Patient is upset because the note to pharmacy states no refill before 01/20/2022, he states that all the math that we are doing with medication is off, and his medications should be due on 01/18/2022 because of the months having 31 days.  Date of patient request: 01/16/2022 Last office visit: 12/19/2021 Date of last refill: 10/21/2021 Last refill amount: 180 tablets  Follow up time period per chart: n/a

## 2022-01-16 NOTE — Telephone Encounter (Signed)
Controlled substance database reviewed.  Last filled alprazolam 2 mg #180 on 10/21/2021.  Previous prescription on 07/24/2021.  31 days in March and May. I will send a new prescription, okay to refill on June 24th.

## 2022-01-16 NOTE — Addendum Note (Signed)
Addended by: Merri Ray R on: 01/16/2022 01:08 PM   Modules accepted: Orders

## 2022-01-20 ENCOUNTER — Telehealth: Payer: Self-pay | Admitting: Physical Medicine and Rehabilitation

## 2022-01-20 ENCOUNTER — Ambulatory Visit (INDEPENDENT_AMBULATORY_CARE_PROVIDER_SITE_OTHER): Payer: PPO | Admitting: Behavioral Health

## 2022-01-20 ENCOUNTER — Encounter: Payer: Self-pay | Admitting: Behavioral Health

## 2022-01-20 VITALS — BP 129/77 | HR 67 | Ht 68.0 in | Wt 167.0 lb

## 2022-01-20 DIAGNOSIS — F99 Mental disorder, not otherwise specified: Secondary | ICD-10-CM

## 2022-01-20 DIAGNOSIS — F331 Major depressive disorder, recurrent, moderate: Secondary | ICD-10-CM | POA: Diagnosis not present

## 2022-01-20 DIAGNOSIS — F3160 Bipolar disorder, current episode mixed, unspecified: Secondary | ICD-10-CM | POA: Diagnosis not present

## 2022-01-20 DIAGNOSIS — F5105 Insomnia due to other mental disorder: Secondary | ICD-10-CM | POA: Diagnosis not present

## 2022-01-20 DIAGNOSIS — F411 Generalized anxiety disorder: Secondary | ICD-10-CM

## 2022-01-20 MED ORDER — TRAZODONE HCL 50 MG PO TABS: 50.0000 mg | ORAL_TABLET | Freq: Every day | ORAL | 1 refills | Status: DC

## 2022-01-20 MED ORDER — ALPRAZOLAM 1 MG PO TABS: 1.0000 mg | ORAL_TABLET | Freq: Two times a day (BID) | ORAL | 3 refills | Status: DC | PRN

## 2022-01-20 MED ORDER — CARIPRAZINE HCL 3 MG PO CAPS: 3.0000 mg | ORAL_CAPSULE | Freq: Every day | ORAL | 1 refills | Status: DC

## 2022-01-20 NOTE — Telephone Encounter (Signed)
Patient called asked if he can be worked in sooner than 02/10/2022? The number to contact patient is [phone]

## 2022-01-29 ENCOUNTER — Telehealth: Payer: Self-pay | Admitting: Orthopaedic Surgery

## 2022-01-29 ENCOUNTER — Telehealth: Payer: Self-pay

## 2022-01-29 NOTE — Telephone Encounter (Signed)
Patient is calling regarding his right shoulder, he states that he would like an Urgent referral to Physical Therapy for his shoulder because he wants to avoid surgery on it with Dr. Marlou Sa. Please call patient to discuss and advise. [phone].

## 2022-01-29 NOTE — Telephone Encounter (Signed)
Prior Authorization submitted for VRAYLAR 3 MG with Rx [employer] medicare, pending response

## 2022-01-30 ENCOUNTER — Other Ambulatory Visit: Payer: Self-pay

## 2022-01-30 DIAGNOSIS — M19011 Primary osteoarthritis, right shoulder: Secondary | ICD-10-CM

## 2022-01-30 NOTE — Telephone Encounter (Signed)
We need to discuss this pt.

## 2022-01-30 NOTE — Telephone Encounter (Signed)
Approval received for VRAYLAR 3 MG effective 01/29/2022-07/27/2022 with [place] [employer].  Will contact his pharmacy and pt to check cost since he's medicare.

## 2022-01-30 NOTE — Telephone Encounter (Signed)
Referral sent 

## 2022-01-30 NOTE — Telephone Encounter (Signed)
Noted, thank you

## 2022-01-30 NOTE — Telephone Encounter (Signed)
Paul Ray can you call and set him up with physical therapy for range of motion and strengthening 2-3 times a week for 6 weeks with home exercise program.  Thanks

## 2022-01-30 NOTE — Telephone Encounter (Signed)
Pharmacy reports cost is $158.27. I tried reaching pt to discuss but had to leave message.

## 2022-02-03 ENCOUNTER — Ambulatory Visit: Payer: Self-pay

## 2022-02-03 ENCOUNTER — Encounter: Payer: Self-pay | Admitting: Physical Medicine and Rehabilitation

## 2022-02-03 ENCOUNTER — Ambulatory Visit: Payer: PPO | Admitting: Physical Medicine and Rehabilitation

## 2022-02-03 ENCOUNTER — Telehealth: Payer: Self-pay | Admitting: Orthopaedic Surgery

## 2022-02-03 VITALS — BP 130/79 | HR 84

## 2022-02-03 DIAGNOSIS — M961 Postlaminectomy syndrome, not elsewhere classified: Secondary | ICD-10-CM

## 2022-02-03 DIAGNOSIS — M5416 Radiculopathy, lumbar region: Secondary | ICD-10-CM | POA: Diagnosis not present

## 2022-02-03 DIAGNOSIS — M48062 Spinal stenosis, lumbar region with neurogenic claudication: Secondary | ICD-10-CM

## 2022-02-03 MED ORDER — METHYLPREDNISOLONE ACETATE 80 MG/ML IJ SUSP
80.0000 mg | Freq: Once | INTRAMUSCULAR | Status: AC
  Administered 2022-02-03: 80 mg

## 2022-02-03 NOTE — Procedures (Signed)
Lumbosacral Transforaminal Epidural Steroid Injection - Sub-Pedicular Approach with Fluoroscopic Guidance  Patient: Paul Ray      Date of Birth: 13-Apr-1956 MRN: [id number] PCP: Maximiano Coss, NP      Visit Date: 02/03/2022   Universal Protocol:    Date/Time: 02/03/2022  Consent Given By: the patient  Position: PRONE  Additional Comments: Vital signs were monitored before and after the procedure. Patient was prepped and draped in the usual sterile fashion. The correct patient, procedure, and site was verified.   Injection Procedure Details:   Procedure diagnoses: Lumbar radiculopathy [M54.16]    Meds Administered:  Meds ordered this encounter  Medications   methylPREDNISolone acetate (DEPO-MEDROL) injection 80 mg    Laterality: Bilateral  Location/Site: L3  Needle:5.0 in., 22 ga.  Short bevel or Quincke spinal needle  Needle Placement: Transforaminal  Findings:    -Comments: Excellent flow of contrast along the nerve, nerve root and into the epidural space.  Procedure Details: After squaring off the end-plates to get a true AP view, the C-arm was positioned so that an oblique view of the foramen as noted above was visualized. The target area is just inferior to the "nose of the scotty dog" or sub pedicular. The soft tissues overlying this structure were infiltrated with 2-3 ml. of 1% Lidocaine without Epinephrine.  The spinal needle was inserted toward the target using a "trajectory" view along the fluoroscope beam.  Under AP and lateral visualization, the needle was advanced so it did not puncture dura and was located close the 6 O'Clock position of the pedical in AP tracterory. Biplanar projections were used to confirm position. Aspiration was confirmed to be negative for CSF and/or blood. A 1-2 ml. volume of Isovue-250 was injected and flow of contrast was noted at each level. Radiographs were obtained for documentation purposes.   After attaining the desired flow  of contrast documented above, a 0.5 to 1.0 ml test dose of 0.25% Marcaine was injected into each respective transforaminal space.  The patient was observed for 90 seconds post injection.  After no sensory deficits were reported, and normal lower extremity motor function was noted,   the above injectate was administered so that equal amounts of the injectate were placed at each foramen (level) into the transforaminal epidural space.   Additional Comments:  The patient tolerated the procedure well Dressing: 2 x 2 sterile gauze and Band-Aid    Post-procedure details: Patient was observed during the procedure. Post-procedure instructions were reviewed.  Patient left the clinic in stable condition.

## 2022-02-03 NOTE — Progress Notes (Signed)
   Paul Ray - 66 y.o. male MRN [id number]  Date of birth: 06/19/1956  Office Visit Note: Visit Date: 02/03/2022 PCP: Maximiano Coss, NP Referred by: Maximiano Coss, NP  Subjective: Chief Complaint  Patient presents with   Lower Back - Pain   HPI:  Paul Ray is a 66 y.o. male who comes in today for planned repeat Bilateral L3-4  Lumbar Transforaminal epidural steroid injection with fluoroscopic guidance.  The patient has failed conservative care including home exercise, medications, time and activity modification.  This injection will be diagnostic and hopefully therapeutic.  Please see requesting physician notes for further details and justification. Patient received more than 50% pain relief from prior injection.   Referring: Dr. Rodell Perna   ROS Otherwise per HPI.  Assessment & Plan: Visit Diagnoses:    ICD-10-CM   1. Lumbar radiculopathy  M54.16 XR C-ARM NO REPORT    Epidural Steroid injection    methylPREDNISolone acetate (DEPO-MEDROL) injection 80 mg    2. Spinal stenosis of lumbar region with neurogenic claudication  M48.062     3. Post laminectomy syndrome  M96.1       Plan: No additional findings.   Meds & Orders:  Meds ordered this encounter  Medications   methylPREDNISolone acetate (DEPO-MEDROL) injection 80 mg    Orders Placed This Encounter  Procedures   XR C-ARM NO REPORT   Epidural Steroid injection    Follow-up: No follow-ups on file.   Procedures: No procedures performed      Clinical History: No specialty comments available.     Objective:  VS:  HT:    WT:   BMI:     BP:130/79  HR:84bpm  TEMP: ( )  RESP:  Physical Exam Vitals and nursing note reviewed.  Constitutional:      General: He is not in acute distress.    Appearance: Normal appearance. He is not ill-appearing.  HENT:     Head: Normocephalic and atraumatic.     Right Ear: External ear normal.     Left Ear: External ear normal.     Nose: No congestion.  Eyes:      Extraocular Movements: Extraocular movements intact.  Cardiovascular:     Rate and Rhythm: Normal rate.     Pulses: Normal pulses.  Pulmonary:     Effort: Pulmonary effort is normal. No respiratory distress.  Abdominal:     General: There is no distension.     Palpations: Abdomen is soft.  Musculoskeletal:        General: No tenderness or signs of injury.     Cervical back: Neck supple.     Right lower leg: No edema.     Left lower leg: No edema.     Comments: Patient has good distal strength without clonus.  Skin:    Findings: No erythema or rash.  Neurological:     General: No focal deficit present.     Mental Status: He is alert and oriented to person, place, and time.     Sensory: No sensory deficit.     Motor: No weakness or abnormal muscle tone.     Coordination: Coordination normal.  Psychiatric:        Mood and Affect: Mood normal.        Behavior: Behavior normal.      Imaging: XR C-ARM NO REPORT  Result Date: 02/03/2022 Please see Notes tab for imaging impression.

## 2022-02-03 NOTE — Telephone Encounter (Signed)
Patient called asked if he can go back to (PT)? Patient said he would like to back to (PT)  for his right shoulder. The number to contact patient is [phone]

## 2022-02-03 NOTE — Patient Instructions (Signed)

## 2022-02-03 NOTE — Progress Notes (Signed)
Pt state lower back pain. Pt state walking makes the pain worse. Pt state he takes over the counter pain worse to help ease his pain.  Numeric Pain Rating Scale and Functional Assessment Average Pain 9   In the last MONTH (on 0-10 scale) has pain interfered with the following?  1. General activity like being  able to carry out your everyday physical activities such as walking, climbing stairs, carrying groceries, or moving a chair?  Rating(10)   +Driver, -BT, -Dye Allergies.

## 2022-02-06 ENCOUNTER — Ambulatory Visit (INDEPENDENT_AMBULATORY_CARE_PROVIDER_SITE_OTHER): Payer: PPO | Admitting: Physical Therapy

## 2022-02-06 ENCOUNTER — Encounter: Payer: Self-pay | Admitting: Physical Therapy

## 2022-02-06 ENCOUNTER — Other Ambulatory Visit: Payer: Self-pay

## 2022-02-06 DIAGNOSIS — G8929 Other chronic pain: Secondary | ICD-10-CM

## 2022-02-06 DIAGNOSIS — M6281 Muscle weakness (generalized): Secondary | ICD-10-CM | POA: Diagnosis not present

## 2022-02-06 DIAGNOSIS — R293 Abnormal posture: Secondary | ICD-10-CM | POA: Diagnosis not present

## 2022-02-06 DIAGNOSIS — M25511 Pain in right shoulder: Secondary | ICD-10-CM | POA: Diagnosis not present

## 2022-02-06 NOTE — Therapy (Signed)
OUTPATIENT PHYSICAL THERAPY SHOULDER EVALUATION   Patient Name: Paul Ray MRN: [id number] DOB:06-27-1956, 66 y.o., male Today's Date: 02/06/2022   PT End of Session - 02/06/22 0842     Visit Number 1    Number of Visits 12    Date for PT Re-Evaluation 03/20/22    Progress Note Due on Visit 10    PT Start Time 0805    PT Stop Time 0850    PT Time Calculation (min) 45 min             Past Medical History:  Diagnosis Date   Abscess of lung(513.0)    Right lower lobe   ADD (attention deficit disorder)    Anxiety    Arthritis    Ascending aortic aneurysm (HCC)    66m by Chest CTA 05/2020 and 487mby echo 05/2020   Benign essential HTN    Bipolar depression (HCCulpe[place]   Coronary artery calcification seen on CAT scan    Empyema lung (H[place]   Hemorrhoids    Hepatitis C    Pneumonia    last year   Prostate abscess    Viral pericarditis    after COVID 19 infection   Past Surgical History:  Procedure Laterality Date   ELBOW SURGERY  2005   Right   FOOT NEUROMA SURGERY  2006   Left   HIP RESURFACING     [facility]   LUMBAR LAMINECTOMY/DECOMPRESSION MICRODISCECTOMY  07/24/2011   Procedure: LUMBAR LAMINECTOMY/DECOMPRESSION MICRODISCECTOMY;  Surgeon: DaEustace Moore [facility];  Service: Neurosurgery;  Laterality: Bilateral;  Bilateral  , Lumbar Three-Four, Lumbar Four-Five Decompressive Laminectomy Rm # 32   LUNG SURGERY     for empyema   SHOULDER ARTHROSCOPY Right 07/05/2013   Procedure: RIGHT SHOULDER ARTHROSCOPY WITH DEBRIDEMENT;  Surgeon: ChMcarthur RossettiMD;  [place] [facility] [place]ics;  Laterality: Right;   TENDON TRANSFER Left 01/26/2014   Procedure: LEFT THUMB TRAPEZIECTOMY KNOTTED TENDON INTRAPOSITION TRANSFER OF [place]BDUCTOR POLLICIS LONGUS [place] Surgeon: RoCammie SickleMD;  [place] [facility] Service: Orthopedics;  Laterality: Left;   THORACOTOMY  November 14 2010   rt   Patient Active  Problem List   Diagnosis Date Noted   History of lumbar laminectomy for spinal cord decompression 01/08/2022   Spinal stenosis of lumbar region 07/25/2021   Foraminal stenosis of cervical region 03/08/2021   Viral pericarditis    Alcohol use disorder, severe, dependence ([place] 12/06/2020   Chest pain 11/09/2020   Ascending aortic aneurysm (HCC)    Benign essential HTN    Coronary artery calcification seen on CAT scan    Psychoactive substance-induced psychosis (HCHowell   Rib pain 07/01/2017   Closed fracture of one rib of left side 07/01/2017   Chronic right shoulder pain 05/04/2017   Osteoarthritis of right hip 12/02/2016   Lumbar radiculopathy 04/17/2014   Chronic low back pain 08/16/2013   Lumbar post-laminectomy syndrome 08/16/2013   Lumbosacral radiculitis 08/16/2013   Impingement syndrome of right shoulder 07/05/2013   Empyema lung (HCRoche[place]   Abscess of lung(513.0)    Bipolar depression (HCEnola   ADD (attention deficit disorder)    Hepatitis C    Hip pain 05/02/2011   History of total hip arthroplasty 04/23/2011   Lung mass 10/22/2010   Pleural effusion 10/15/2010    PCP: MoMaximiano CossNP  REFERRING PROVIDER: DeMeredith PelMD  REFERRING  DIAG: M19.011 (ICD-10-CM) - Arthritis of right shoulder region  THERAPY DIAG:  Chronic right shoulder pain  Muscle weakness (generalized)  Abnormal posture  Rationale for Evaluation and Treatment Rehabilitation  ONSET DATE: chronic pain  SUBJECTIVE:                                                                                                                                                                                      SUBJECTIVE STATEMENT: He continues to have chronic pain and weakness which limits his ability to use his right arm. He says Dr. Marlou Sa wants him to have shoulder replacement but he is not interested in this at this time. He wants to do more PT instead to see if this helps. He wants to prevent atrophy  and get his exercises and bands that he has lost since the last time he was in PT.   PERTINENT HISTORY: Previous laminectomy, shoulder arthroscopy 2014  PAIN:  Are you having pain? Yes: NPRS scale: 7-10/10 Pain location: Rt shoulder Pain description: sharp and dull Aggravating factors: lifting Relieving factors: stretching  PRECAUTIONS: None  WEIGHT BEARING RESTRICTIONS No  FALLS:  Has patient fallen in last 6 months? No  OCCUPATION:    PLOF: Independent  PATIENT GOALS : learn HEP again and prevent atrophy  OBJECTIVE:   DIAGNOSTIC FINDINGS:  CT shoulder impression 1. Moderate to severe right glenohumeral osteoarthritis. 2. Stable 4.8 cm thoracic aortic aneurysm. Ascending thoracic aortic aneurysm  PATIENT SURVEYS:  FOTO 2nd visit as he did not finish it on eval  COGNITION:  Overall cognitive status: Within functional limits for tasks assessed     SENSATION: WFL  POSTURE: Rounded shoulders  UPPER EXTREMITY ROM:   Active ROM Right eval Left eval  Shoulder flexion 140   Shoulder extension    Shoulder abduction 140   Shoulder adduction    Shoulder internal rotation L3 behind back, 90% of left shoulder   Shoulder external rotation C7 behind back, 90% of left shoulder   Elbow flexion    Elbow extension    Wrist flexion    Wrist extension    Wrist ulnar deviation    Wrist radial deviation    Wrist pronation    Wrist supination    (Blank rows = not tested)  UPPER EXTREMITY MMT:  MMT Right eval Left eval  Shoulder flexion 4   Shoulder extension    Shoulder abduction 4   Shoulder adduction    Shoulder internal rotation 5   Shoulder external rotation 4-   Middle trapezius    Lower trapezius    Elbow flexion    Elbow extension    Wrist flexion  Wrist extension    Wrist ulnar deviation    Wrist radial deviation    Wrist pronation    Wrist supination    Grip strength (lbs)    (Blank rows = not tested)  SHOULDER SPECIAL  TESTS:  Impingement tests: mild to moderate impingement signs.     JOINT MOBILITY TESTING:  Decreased overall GH mobility   PALPATION:     TODAY'S TREATMENT:  02/06/22 Performed HEP listed below   PATIENT EDUCATION: Education details: HEP, PT plan of care Person educated: Patient Education method: Explanation, Demonstration, Verbal cues, and Handouts Education comprehension: verbalized understanding, returned demonstration, and needs further education   HOME EXERCISE PROGRAM: Access Code: [email or web address] URL: [email or web address] Date: 02/06/2022 Prepared by: Elsie Ra  Exercises - Scapular Retraction with Resistance  - 1 x daily - 7 x weekly - 1-2 sets - 20 reps - 5 seconds hold - Shoulder Extension with Resistance Hands Down  - 1 x daily - 7 x weekly - 2-3 sets - 20 reps - Shoulder External Rotation with Anchored Resistance with Towel Under Elbow  - 1 x daily - 7 x weekly - 2-3 sets - 10 reps - 3 hold - Standing Shoulder Flexion with Resistance  - 2 x daily - 6 x weekly - 2-3 sets - 10 reps - Prone Alternating Arm and Leg Lifts  - 2 x daily - 6 x weekly - 2 sets - 10 reps - Prone Scapular Retraction Arms at Side  - 2 x daily - 6 x weekly - 2 sets - 10 reps - Doorway Pec Stretch at 120 Degrees Abduction  - 2 x daily - 6 x weekly - 1 sets - 3 reps - 30 hold - Standing Shoulder Posterior Capsule Stretch  - 2 x daily - 3-4 x weekly - 1 sets - 5 reps - 10 hold - Seated Passive Cervical Retraction  - 2 x daily - 6 x weekly - 1 sets - 10 reps - 5 hold - Seated Cervical Sidebending Stretch  - 2 x daily - 6 x weekly - 1 sets - 2-3 reps - 30 sec hold ASSESSMENT:  CLINICAL IMPRESSION: Patient presents to PT with chronic Rt shoulder pain with severe OA. MD has recommended reverse TSA but patient would like to try PT instead.  Patient will benefit from skilled PT to address below impairments, limitations and improve overall function.  OBJECTIVE IMPAIRMENTS: decreased  activity tolerance, decreased shoulder mobility, decreased ROM, decreased strength, impaired flexibility, impaired UE use, postural dysfunction, and pain.  ACTIVITY LIMITATIONS: reaching, lifting, carry,  cleaning, driving, and or occupation  PERSONAL FACTORS:  Previous laminectomy, shoulder arthroscopy 2014 are also affecting patient's functional outcome.  REHAB POTENTIAL: Good  CLINICAL DECISION MAKING: Stable/uncomplicated  EVALUATION COMPLEXITY: Low    GOALS: Short term PT Goals Target date: 03/06/2022 Pt will be I and compliant with HEP. Baseline:  Goal status: New Pt will decrease pain by 25% overall Baseline: Goal status: New  Long term PT goals Target date: 04/03/2022 Pt will improve Rt shoulder AROM to [facility] (at least 150 deg flexion and abd) to improve functional reaching Baseline: Goal status: New Pt will improve  Rt shoulder strength to at least 4+/5 MMT to improve functional strength Baseline: Goal status: New Pt will improve FOTO to at least % predicted functional to show improved function Baseline: Goal status: New Pt will reduce pain to overall less than 4/10 with usual activity and work activity. Baseline: Goal status: New  PLAN: PT FREQUENCY:  1-2 times per week   PT DURATION: 6-8 weeks  PLANNED INTERVENTIONS (unless contraindicated): aquatic PT, Canalith repositioning, cryotherapy, Electrical stimulation, Iontophoresis with 4 mg/ml dexamethasome, Moist heat, traction, Ultrasound, gait training, Therapeutic exercise, balance training, neuromuscular re-education, patient/family education, prosthetic training, manual techniques, passive ROM, dry needling, taping, vasopnuematic device, vestibular, spinal manipulations, joint manipulations  PLAN FOR NEXT SESSION: review HEP    Debbe Odea, PT,DPT 02/06/2022, 9:09 AM

## 2022-02-10 ENCOUNTER — Encounter: Payer: PPO | Admitting: Physical Medicine and Rehabilitation

## 2022-02-13 ENCOUNTER — Ambulatory Visit (INDEPENDENT_AMBULATORY_CARE_PROVIDER_SITE_OTHER): Payer: PPO | Admitting: Physical Therapy

## 2022-02-13 ENCOUNTER — Encounter: Payer: Self-pay | Admitting: Physical Therapy

## 2022-02-13 DIAGNOSIS — M25511 Pain in right shoulder: Secondary | ICD-10-CM | POA: Diagnosis not present

## 2022-02-13 DIAGNOSIS — M6281 Muscle weakness (generalized): Secondary | ICD-10-CM | POA: Diagnosis not present

## 2022-02-13 DIAGNOSIS — M542 Cervicalgia: Secondary | ICD-10-CM | POA: Diagnosis not present

## 2022-02-13 DIAGNOSIS — G8929 Other chronic pain: Secondary | ICD-10-CM | POA: Diagnosis not present

## 2022-02-13 DIAGNOSIS — R293 Abnormal posture: Secondary | ICD-10-CM

## 2022-02-13 NOTE — Therapy (Addendum)
OUTPATIENT PHYSICAL THERAPY TREATMENT NOTE PHYSICAL THERAPY DISCHARGE SUMMARY  Visits from Start of Care: 2  Current functional level related to goals / functional outcomes: See below   Remaining deficits: See below   Education / Equipment: HEP  Plan:  Patient goals were not met. Patient is being discharged due to not returning since last visit. Elsie Ra, PT, DPT 04/04/22 8:48 AM       Patient Name: MARBIN OLSHEFSKI MRN: [id number] DOB:June 11, 1956, 66 y.o., male Today's Date: 02/13/2022   END OF SESSION:   PT End of Session - 02/13/22 0809     Visit Number 2    Number of Visits 12    Date for PT Re-Evaluation 04/03/22    Progress Note Due on Visit 10    PT Start Time 0805    PT Stop Time 0845    PT Time Calculation (min) 40 min    Behavior During Therapy [facility] for tasks assessed/performed             Past Medical History:  Diagnosis Date   Abscess of lung(513.0)    Right lower lobe   ADD (attention deficit disorder)    Anxiety    Arthritis    Ascending aortic aneurysm (HCC)    39m by Chest CTA 05/2020 and 471mby echo 05/2020   Benign essential HTN    Bipolar depression (HCEdwards[place]   Coronary artery calcification seen on CAT scan    Empyema lung (HCNew [place]   Hemorrhoids    Hepatitis C    Pneumonia    last year   Prostate abscess    Viral pericarditis    after COVID 19 infection   Past Surgical History:  Procedure Laterality Date   ELBOW SURGERY  2005   Right   FOOT NEUROMA SURGERY  2006   Left   [facility]     [facility]   LUMBAR LAMINECTOMY/DECOMPRESSION MICRODISCECTOMY  07/24/2011   Procedure: LUMBAR LAMINECTOMY/DECOMPRESSION MICRODISCECTOMY;  Surgeon: DaEustace Moore [facility];  Service: Neurosurgery;  Laterality: Bilateral;  Bilateral  , Lumbar Three-Four, Lumbar Four-Five Decompressive Laminectomy Rm # 32   LUNG SURGERY     for empyema   SHOULDER ARTHROSCOPY Right 07/05/2013   Procedure: RIGHT SHOULDER ARTHROSCOPY  WITH DEBRIDEMENT;  Surgeon: ChMcarthur RossettiMD;  [place]: [place];  Laterality: Right;   TENDON TRANSFER Left 01/26/2014   Procedure: LEFT THUMB TRAPEZIECTOMY KNOTTED TENDON INTRAPOSITION TRANSFER OF ABDUCTOR POLLICIS LONGUS [place]geon: RoCammie SickleMD;  [place]: [place]ics;  Laterality: Left;   THORACOTOMY  November 14 2010   rt   Patient Active Problem List   Diagnosis Date Noted   History of lumbar laminectomy for spinal cord decompression 01/08/2022   Spinal stenosis of lumbar region 07/25/2021   Foraminal stenosis of cervical region 03/08/2021   Viral pericarditis    Alcohol use disorder, severe, dependence (HCRim[place]05/06/2021   Chest pain 11/09/2020   Ascending aortic aneurysm (HCC)    Benign essential HTN    Coronary artery calcification seen on CAT scan    Psychoactive substance-induced psychosis (HCGlas[place]   Rib pain 07/01/2017   Closed fracture of one rib of left side 07/01/2017   Chronic right shoulder pain 05/04/2017   Osteoarthritis of right hip 12/02/2016   Lumbar radiculopathy 04/17/2014   Chronic low back pain 08/16/2013   Lumbar post-laminectomy syndrome 08/16/2013  Lumbosacral radiculitis 08/16/2013   Impingement syndrome of right shoulder 07/05/2013   Empyema lung (HCC)    Abscess of lung(513.0)    Bipolar depression (HCC)    ADD (attention deficit disorder)    Hepatitis C    Hip pain 05/02/2011   History of total hip arthroplasty 04/23/2011   Lung mass 10/22/2010   Pleural effusion 10/15/2010     THERAPY DIAG:  Chronic right shoulder pain  Muscle weakness (generalized)  Abnormal posture  Cervicalgia    PCP: Maximiano Coss, NP   REFERRING PROVIDER: Meredith Pel, MD   REFERRING DIAG: M19.011 (ICD-10-CM) - Arthritis of right shoulder region   Rationale for Evaluation and Treatment Rehabilitation   ONSET DATE: chronic pain   SUBJECTIVE:                                                                                                                                                                                        SUBJECTIVE STATEMENT: He states he has been doing exercises and feeling some better. He would like to review them again today     PERTINENT HISTORY: Previous laminectomy, shoulder arthroscopy 2014   PAIN:  Are you having pain? Yes: NPRS scale: 3/10 Pain location: Rt shoulder Pain description: sharp and dull Aggravating factors: lifting Relieving factors: stretching   PRECAUTIONS: None   WEIGHT BEARING RESTRICTIONS No   FALLS:  Has patient fallen in last 6 months? No   OCCUPATION:      PLOF: Independent   PATIENT GOALS : learn HEP again and prevent atrophy   OBJECTIVE:    DIAGNOSTIC FINDINGS:  CT shoulder impression 1. Moderate to severe right glenohumeral osteoarthritis. 2. Stable 4.8 cm thoracic aortic aneurysm. Ascending thoracic aortic aneurysm   PATIENT SURVEYS:  FOTO 2nd visit as he did not finish it on eval   COGNITION:           Overall cognitive status: Within functional limits for tasks assessed                                  SENSATION: WFL   POSTURE: Rounded shoulders   UPPER EXTREMITY ROM:    Active ROM Right eval Left eval  Shoulder flexion 140    Shoulder extension      Shoulder abduction 140    Shoulder adduction      Shoulder internal rotation L3 behind back, 90% of left shoulder    Shoulder external rotation C7 behind back, 90% of left shoulder    Elbow flexion      Elbow extension  Wrist flexion      Wrist extension      Wrist ulnar deviation      Wrist radial deviation      Wrist pronation      Wrist supination      (Blank rows = not tested)   UPPER EXTREMITY MMT:   MMT Right eval Left eval  Shoulder flexion 4    Shoulder extension      Shoulder abduction 4    Shoulder adduction      Shoulder internal rotation 5    Shoulder external rotation 4-    Middle  trapezius      Lower trapezius      Elbow flexion      Elbow extension      Wrist flexion      Wrist extension      Wrist ulnar deviation      Wrist radial deviation      Wrist pronation      Wrist supination      Grip strength (lbs)      (Blank rows = not tested)   SHOULDER SPECIAL TESTS:            Impingement tests: mild to moderate impingement signs.                JOINT MOBILITY TESTING:  Decreased overall GH mobility    PALPATION:                TODAY'S TREATMENT:  02/13/22 -UBE L5 2.5 min fwd, 2.5 min retro -Scapular retractions blue 2X15 -Shoulder ER green 3X10 bilat -Shoulder flexion green 2X10 -prone arm lifts/leg lifts X10 bilat -prone T's X10 bilat -Standing H abd with green X10 bilat -Door stretch 120 deg flexion X 30 sec -Posterior capsule stretch 10 sec X 5 bilat -Chin tucks X10 -Upper trap stretch 30 sec X 2 bilat -Gripping green hand grip X20 -Reviewed contrast bath edu for hand pain  02/06/22 Performed HEP listed below     PATIENT EDUCATION: Education details: HEP, PT plan of care Person educated: Patient Education method: Explanation, Demonstration, Verbal cues, and Handouts Education comprehension: verbalized understanding, returned demonstration, and needs further education     HOME EXERCISE PROGRAM: Access Code: [email or web address] URL: [email or web address] Date: 02/06/2022 Prepared by: Elsie Ra   Exercises - Scapular Retraction with Resistance  - 1 x daily - 7 x weekly - 1-2 sets - 20 reps - 5 seconds hold - Shoulder Extension with Resistance Hands Down  - 1 x daily - 7 x weekly - 2-3 sets - 20 reps - Shoulder External Rotation with Anchored Resistance with Towel Under Elbow  - 1 x daily - 7 x weekly - 2-3 sets - 10 reps - 3 hold - Standing Shoulder Flexion with Resistance  - 2 x daily - 6 x weekly - 2-3 sets - 10 reps - Prone Alternating Arm and Leg Lifts  - 2 x daily - 6 x weekly - 2 sets - 10 reps - Prone Scapular  Retraction Arms at Side  - 2 x daily - 6 x weekly - 2 sets - 10 reps - Doorway Pec Stretch at 120 Degrees Abduction  - 2 x daily - 6 x weekly - 1 sets - 3 reps - 30 hold - Standing Shoulder Posterior Capsule Stretch  - 2 x daily - 3-4 x weekly - 1 sets - 5 reps - 10 hold - Seated Passive Cervical Retraction  - 2 x daily - 6 x weekly -  1 sets - 10 reps - 5 hold - Seated Cervical Sidebending Stretch  - 2 x daily - 6 x weekly - 1 sets - 2-3 reps - 30 sec hold ASSESSMENT:   CLINICAL IMPRESSION: Session focused on HEP review and he shows good overall understanding of this. He will continue to benefit from PT for further strengthening and ROM of Rt shoulder.    OBJECTIVE IMPAIRMENTS: decreased activity tolerance, decreased shoulder mobility, decreased ROM, decreased strength, impaired flexibility, impaired UE use, postural dysfunction, and pain.   ACTIVITY LIMITATIONS: reaching, lifting, carry,  cleaning, driving, and or occupation   PERSONAL FACTORS:  Previous laminectomy, shoulder arthroscopy 2014 are also affecting patient's functional outcome.   REHAB POTENTIAL: Good   CLINICAL DECISION MAKING: Stable/uncomplicated   EVALUATION COMPLEXITY: Low       GOALS: Short term PT Goals Target date: 03/06/2022 Pt will be I and compliant with HEP. Baseline:  Goal status: New Pt will decrease pain by 25% overall Baseline: Goal status: New   Long term PT goals Target date: 04/03/2022 Pt will improve Rt shoulder AROM to [facility] (at least 150 deg flexion and abd) to improve functional reaching Baseline: Goal status: New Pt will improve  Rt shoulder strength to at least 4+/5 MMT to improve functional strength Baseline: Goal status: New Pt will improve FOTO to at least % predicted functional to show improved function Baseline: Goal status: New Pt will reduce pain to overall less than 4/10 with usual activity and work activity. Baseline: Goal status: New   PLAN: PT FREQUENCY: 1-2 times per week     PT DURATION: 6-8 weeks   PLANNED INTERVENTIONS (unless contraindicated): aquatic PT, Canalith repositioning, cryotherapy, Electrical stimulation, Iontophoresis with 4 mg/ml dexamethasome, Moist heat, traction, Ultrasound, gait training, Therapeutic exercise, balance training, neuromuscular re-education, patient/family education, prosthetic training, manual techniques, passive ROM, dry needling, taping, vasopnuematic device, vestibular, spinal manipulations, joint manipulations   PLAN FOR NEXT SESSION: strength and ROM for Rt shoulder to tolerance.    Debbe Odea, PT,DPT 02/13/2022, 8:10 AM

## 2022-02-18 ENCOUNTER — Encounter: Payer: Self-pay | Admitting: Behavioral Health

## 2022-02-18 ENCOUNTER — Telehealth: Payer: Self-pay | Admitting: Behavioral Health

## 2022-02-18 ENCOUNTER — Other Ambulatory Visit: Payer: Self-pay

## 2022-02-18 ENCOUNTER — Telehealth: Payer: Self-pay

## 2022-02-18 ENCOUNTER — Ambulatory Visit (INDEPENDENT_AMBULATORY_CARE_PROVIDER_SITE_OTHER): Payer: PPO | Admitting: Behavioral Health

## 2022-02-18 DIAGNOSIS — F3160 Bipolar disorder, current episode mixed, unspecified: Secondary | ICD-10-CM

## 2022-02-18 DIAGNOSIS — F331 Major depressive disorder, recurrent, moderate: Secondary | ICD-10-CM

## 2022-02-18 DIAGNOSIS — F411 Generalized anxiety disorder: Secondary | ICD-10-CM

## 2022-02-18 MED ORDER — CARIPRAZINE HCL 1.5 MG PO CAPS
1.5000 mg | ORAL_CAPSULE | Freq: Every day | ORAL | 1 refills | Status: DC
Start: 2022-02-18 — End: 2022-04-01

## 2022-02-18 MED ORDER — AMPHETAMINE-DEXTROAMPHETAMINE 20 MG PO TABS: ORAL_TABLET | ORAL | 0 refills | Status: DC

## 2022-02-18 MED ORDER — AMPHETAMINE-DEXTROAMPHET ER 30 MG PO CP24: ORAL_CAPSULE | ORAL | 0 refills | Status: DC

## 2022-02-18 NOTE — Telephone Encounter (Signed)
He LVM again at 11:20.  He wants the two scripts sent to [facility], but didn't say if has checked on availability.  He also want Traci to call him back

## 2022-02-18 NOTE — Telephone Encounter (Signed)
Rx was sent  

## 2022-02-18 NOTE — Telephone Encounter (Signed)
Prior Approval received for 1.5 mg Vraylar effective 02/18/22-07/27/2022 with [employer].

## 2022-02-18 NOTE — Telephone Encounter (Signed)
Prior Authorization submitted with [employer] for VRAYLAR 1.5 MG, although pt is already approved for 3 mg, pending response at this time.

## 2022-02-18 NOTE — Telephone Encounter (Signed)
Pt LVM @ 11a.  He said he saw Aaron Edelman this morning.  He said he wants to know the name of the two generic Adderall scripts Aaron Edelman prescribed.   Next appt 9/5

## 2022-02-18 NOTE — Progress Notes (Signed)
[facility]  Patient ID: Paul Ray,  MRN: [id number]  PCP: Maximiano Coss, NP  Date of Evaluation: 02/18/2022 Time spent:30 minutes  Chief Complaint:  Chief Complaint   Manic Behavior; Follow-up; Medication Refill; Medication Problem; Anxiety; Depression     HISTORY/CURRENT STATUS: HPI  66 year old male presents to this office for follow up and medication management. He is irritable and agitated at times. He is confrontational and says that I reduced his xanax so he filled a previous script from Dr. Toy Care. Last week he pronounced anxiety and depression his priority. This visit says that he was not depressed and ADHD was his priority. He was difficult to interview due to argumentative nature today.  He is frustrated that I reduced his xanax and would like to return to the 4 mg daily dose previously written by another provider. He says he does not like the Vraylar because it make him too tired during the day. Says he would like to stop. Says his anxiety today is 0/10 and depression is 0/10. Last visit said his depression was a 7/10 and anxiety 8/10. He is sleeping 6 hours per night and requesting medication to help with quality of sleep. He is open to trying new medication to help at this time. He denies current mania, no current psychosis, no SI/HI. He says he will request medical records.   Prior psychiatric medications as reported: Zoloft Wellbutrin Prozac Abilify Seroquel Zyprexa   Individual Medical History/ Review of Systems: Changes? :No   Allergies: Quinolones  Current Medications:  Current Outpatient Medications:    cariprazine (VRAYLAR) 1.5 MG capsule, Take 1 capsule (1.5 mg total) by mouth daily., Disp: 30 capsule, Rfl: 1   alprazolam (XANAX) 2 MG tablet, Take 1 tablet (2 mg total) by mouth 2 (two) times daily as needed for sleep., Disp: 180 tablet, Rfl: 0   cariprazine (VRAYLAR) 3 MG capsule, Take 1 capsule (3 mg total) by mouth daily., Disp: 30 capsule,  Rfl: 1   lamoTRIgine (LAMICTAL) 200 MG tablet, TAKE 1 TABLET (200 MG TOTAL) BY MOUTH IN THE MORNING., Disp: 90 tablet, Rfl: 0   predniSONE (STERAPRED UNI-PAK 21 TAB) 5 MG (21) TBPK tablet, Take as directed, Disp: 1 tablet, Rfl: 0   sildenafil (VIAGRA) 50 MG tablet, Take 50 mg by mouth daily as needed., Disp: , Rfl:    silodosin (RAPAFLO) 8 MG CAPS capsule, Take 8 mg by mouth daily., Disp: , Rfl:    traZODone (DESYREL) 50 MG tablet, Take 1 tablet (50 mg total) by mouth at bedtime., Disp: 30 tablet, Rfl: 1 Medication Side Effects: none  Family Medical/ Social History: Changes? No  MENTAL HEALTH EXAM:  There were no vitals taken for this visit.There is no height or weight on file to calculate BMI.  General Appearance: Casual, Neat, and Well Groomed  Eye Contact:  Good  Speech:  Clear and Coherent  Volume:  Normal  Mood:  Angry  Affect:  Appropriate  Thought Process:  Coherent  Orientation:  Full (Time, Place, and Person)  Thought Content: Logical   Suicidal Thoughts:  No  Homicidal Thoughts:  No  Memory:  WNL  Judgement:  Good  Insight:  Good  Psychomotor Activity:  Normal  Concentration:  Concentration: Good  Recall:  Good  Fund of Knowledge: Good  Language: Good  Assets:  Desire for Improvement  ADL's:  Intact  Cognition: WNL  Prognosis:  Good    DIAGNOSES:    ICD-10-CM   1. Major depressive  disorder, recurrent episode, moderate (HCC)  F33.1 cariprazine (VRAYLAR) 1.5 MG capsule    2. Generalized anxiety disorder  F41.1 cariprazine (VRAYLAR) 1.5 MG capsule    3. Bipolar I disorder, most recent episode mixed ([place])  F31.60 cariprazine (VRAYLAR) 1.5 MG capsule      Receiving Psychotherapy: No    RECOMMENDATIONS:    Greater than 50% of 45 min face to face time with patient was spent on counseling and coordination of care. We reviewed again his long hx of bipolar disorder under the care of Dr. Chucky May.  He said that although he is taking the Vraylar, he does not  like it. I tried to explain that it can take up to 6 weeks or long to adjust to medication and that he should continue to try as long as no serious side effects. Says it makes him nap two times during the day. He is demanding that I cancel my xanax script for 1 mg twice daily and re write 2 mg twice daily as Dr. Toy Care. He did not fill the script I wrote but went and filled the old script from Dr. Toy Care. He was extremely argumentative, agitated, and irritable. He questioned everything we agreed upon last visit. I reminded him of his long hx of bipolar and non compliance of medication that was non benzo or stimulant related. I informed him that under my care that I was not comfortable with continuing 4 mg per day of xanax and that I would not continue to do so. I educated him on possible long term effects of benzo use. I told him that we would reduce this amount slowly but that we would have to have a plan to eventually wean down. I suggested Ativan to help with weaning process and he said he did not like Ativan and tried this before.  It was a very difficult interview and patient was confrontational at times. I expressed my concern that he was not willing to follow instructions and if this was gong to be the norm then a therapeutic relationship could not be achieved. He has some hx of being discharged from another provider due to argumentative behaviors.  This visit he also denied being depressed, contrary to initial visit.  He said that he did not want to address depression and anxiety but his ADHD again this visit.  He reports his brother is schizophrenic. I am concerned about his presentation and may have criterion more in line with schizophrenic traits. To monitory closely. We agreed today to: Since he filled another RX of Xanax from another provider, I will not prescribe a refill today. My RX was canceled for now.  He will reduce Vraylar to 1.5 mg daily. capsule daily. Samples of 1.5 mg provided for two weeks.  Instructed to utilized discount card.  Will reinitiate Adderall. 30 mg ER in the am and 20 mg IR in the afternoon. Educated Pt that 60 mg was my prescribing limit per day. He has previously been on up to 80 mg per day from previous provider.  To start Trazodone 50 mg at bedtime daily Continue Lamictal 200 mg daily To report worsening symptoms or side effects promptly. Provided emergency contact information Discussed potential benefits, risks, and side effects of stimulants with patient to include increased heart rate, palpitations, insomnia, increased anxiety, increased irritability, or decreased appetite.  Instructed patient to contact office if experiencing any significant tolerability issues.  Discussed potential metabolic side effects associated with atypical antipsychotics, as well as potential risk  for movement side effects. Advised pt to contact office if movement side effects occur.   Discussed potential benefits, risk, and side effects of benzodiazepines to include potential risk of tolerance and dependence, as well as possible drowsiness.  Advised patient not to drive if experiencing drowsiness and to take lowest possible effective dose to minimize risk of dependence and tolerance.  Monitor for any sign of rash. Please taking Lamictal and contact office immediately rash develops. Recommend seeking urgent medical attention if rash is severe and/or spreading quickly.  Reviewed PDMP       Elwanda Brooklyn, NP

## 2022-02-18 NOTE — Telephone Encounter (Signed)
I know you aren't finished with his note from his visit earlier today, but are you prescribing stimulants for him? Please see notes.

## 2022-02-19 ENCOUNTER — Other Ambulatory Visit: Payer: Self-pay

## 2022-02-19 ENCOUNTER — Telehealth: Payer: Self-pay | Admitting: Behavioral Health

## 2022-02-19 MED ORDER — AMPHETAMINE-DEXTROAMPHET ER 30 MG PO CP24: ORAL_CAPSULE | ORAL | 0 refills | Status: DC

## 2022-02-19 MED ORDER — AMPHETAMINE-DEXTROAMPHETAMINE 20 MG PO TABS: ORAL_TABLET | ORAL | 0 refills | Status: DC

## 2022-02-19 NOTE — Telephone Encounter (Signed)
Rodderick called and is upset. He said his RX for the Adderall 20 mg and Adderall XR 30 mg were called into the wrong pharmacy. He states that these were to be called into the [facility] on [address] in [place]. Phone number is [phone]. Also he said that his Xanax didn't need to be filled again but was filled anyway.

## 2022-02-19 NOTE — Telephone Encounter (Signed)
Pended to the requested pharmacy. Canceled Rx sent to [facility] yesterday.

## 2022-02-26 ENCOUNTER — Encounter: Payer: PPO | Admitting: Physical Therapy

## 2022-03-07 ENCOUNTER — Other Ambulatory Visit: Payer: Self-pay

## 2022-03-07 NOTE — Patient Outreach (Signed)
  Care Coordination   Initial Visit Note   03/07/2022 Name: Paul Ray MRN: [id number] DOB: 1955-12-28  Paul Ray is a 66 y.o. year old male who sees Maximiano Coss, NP for primary care. I spoke with  [name] by phone today  What matters to the patients health and wellness today?  I am doing fine and do not want to talk    Goals Addressed   None     SDOH assessments and interventions completed:  No     Care Coordination Interventions Activated:  No  Care Coordination Interventions:  No, not indicated   Follow up plan: No further intervention required.   Encounter Outcome:  Pt. Refused {THN Tip this will not be part of the note when signed-REQUIRED REPORT FIELD DO NOT DELETE (Optional):[id number] Peter Garter RN, BSN,CCM, [facility] [phone]

## 2022-03-13 ENCOUNTER — Other Ambulatory Visit: Payer: Self-pay

## 2022-03-13 DIAGNOSIS — F319 Bipolar disorder, unspecified: Secondary | ICD-10-CM

## 2022-03-13 NOTE — Telephone Encounter (Signed)
Patient is requesting a refill of the following medications: Requested Prescriptions   Pending Prescriptions Disp Refills   lamoTRIgine (LAMICTAL) 200 MG tablet 90 tablet 0    Sig: Take 1 tablet (200 mg total) by mouth in the morning.    Date of patient request: 03/13/22 Last office visit: 12/19/21 Date of last refill: 09/21/21 Last refill amount: 90

## 2022-03-13 NOTE — Telephone Encounter (Signed)
Declined Lamictal refill, message sent to pharmacy as he is under the care of [facility], refill should be requested from psychiatric office.  Thanks

## 2022-03-18 ENCOUNTER — Other Ambulatory Visit: Payer: Self-pay

## 2022-03-18 ENCOUNTER — Telehealth: Payer: Self-pay | Admitting: Behavioral Health

## 2022-03-18 ENCOUNTER — Telehealth: Payer: Self-pay | Admitting: Registered Nurse

## 2022-03-18 DIAGNOSIS — F319 Bipolar disorder, unspecified: Secondary | ICD-10-CM

## 2022-03-18 MED ORDER — LAMOTRIGINE 200 MG PO TABS: 200.0000 mg | ORAL_TABLET | Freq: Every morning | ORAL | 1 refills | Status: DC

## 2022-03-18 NOTE — Telephone Encounter (Signed)
Pt called at 2:37p.  He would like refill of Adderall XR '30mg'$  and Adderall '20mg'$  sent to [facility].  Both are in stock there.  Next appt 9/5

## 2022-03-18 NOTE — Telephone Encounter (Signed)
Encourage patient to contact the pharmacy for refills or they can request refills through [facility]  (Please schedule appointment if patient has not been seen in over a year)    WHAT PHARMACY WOULD THEY LIKE THIS SENT TO: [facility] [phone]  MEDICATION NAME & DOSE: Lamictal 200 mg  NOTES/COMMENTS FROM PATIENT: Call pt once called in to pharmacy.       [place] office please notify patient: It takes 48-72 hours to process rx refill requests Ask patient to call pharmacy to ensure rx is ready before heading there.

## 2022-03-18 NOTE — Telephone Encounter (Signed)
Refill sent to pharmacy.   

## 2022-03-19 ENCOUNTER — Other Ambulatory Visit: Payer: Self-pay

## 2022-03-19 MED ORDER — AMPHETAMINE-DEXTROAMPHETAMINE 20 MG PO TABS: ORAL_TABLET | ORAL | 0 refills | Status: DC

## 2022-03-19 MED ORDER — AMPHETAMINE-DEXTROAMPHET ER 30 MG PO CP24: ORAL_CAPSULE | ORAL | 0 refills | Status: DC

## 2022-03-19 NOTE — Telephone Encounter (Signed)
Pended.

## 2022-04-01 ENCOUNTER — Encounter: Payer: Self-pay | Admitting: Behavioral Health

## 2022-04-01 ENCOUNTER — Ambulatory Visit (INDEPENDENT_AMBULATORY_CARE_PROVIDER_SITE_OTHER): Payer: PPO | Admitting: Behavioral Health

## 2022-04-01 DIAGNOSIS — F99 Mental disorder, not otherwise specified: Secondary | ICD-10-CM

## 2022-04-01 DIAGNOSIS — F319 Bipolar disorder, unspecified: Secondary | ICD-10-CM

## 2022-04-01 DIAGNOSIS — F331 Major depressive disorder, recurrent, moderate: Secondary | ICD-10-CM

## 2022-04-01 DIAGNOSIS — F5105 Insomnia due to other mental disorder: Secondary | ICD-10-CM

## 2022-04-01 DIAGNOSIS — F411 Generalized anxiety disorder: Secondary | ICD-10-CM | POA: Diagnosis not present

## 2022-04-01 DIAGNOSIS — F3160 Bipolar disorder, current episode mixed, unspecified: Secondary | ICD-10-CM

## 2022-04-01 MED ORDER — AMPHETAMINE-DEXTROAMPHET ER 30 MG PO CP24: ORAL_CAPSULE | ORAL | 0 refills | Status: DC

## 2022-04-01 MED ORDER — CARIPRAZINE HCL 1.5 MG PO CAPS: 1.5000 mg | ORAL_CAPSULE | Freq: Every day | ORAL | 3 refills | Status: DC

## 2022-04-01 MED ORDER — AMPHETAMINE-DEXTROAMPHETAMINE 20 MG PO TABS: ORAL_TABLET | ORAL | 0 refills | Status: DC

## 2022-04-01 MED ORDER — LAMOTRIGINE 200 MG PO TABS: 200.0000 mg | ORAL_TABLET | Freq: Every morning | ORAL | 2 refills | Status: DC

## 2022-04-01 NOTE — Progress Notes (Signed)
[facility]  Patient ID: Paul Ray,  MRN: [id number]  PCP: Maximiano Coss, NP  Date of Evaluation: 04/01/2022 Time spent:30 minutes  Chief Complaint:   HISTORY/CURRENT STATUS: HPI  66 year old male presents to this office for follow up and medication management. He is irritable and agitated at times, some flight of ideas.  He says that he believes the Arman Filter is now working. He would like to continue on the medication for now. Says that he does not want to come in often for financial reasons and would like to follow up after holidays. Says he will call if he has any problems. His anxiety today is 0/10 and depression is 0/10. Last visit said his depression was a 0/10 and anxiety 0/10. Says his sleep is not where he would like it to be but is sleeping about 6-7  hours per night. Trazodone help a little. He denies current mania, no current psychosis, no SI/HI. He says he will request medical records.   Prior psychiatric medications as reported: Zoloft Wellbutrin Prozac Abilify Seroquel Zyprexa    Individual Medical History/ Review of Systems: Changes? :No   Allergies: Quinolones  Current Medications:  Current Outpatient Medications:    alprazolam (XANAX) 2 MG tablet, Take 1 tablet (2 mg total) by mouth 2 (two) times daily as needed for sleep., Disp: 180 tablet, Rfl: 0   amphetamine-dextroamphetamine (ADDERALL XR) 30 MG 24 hr capsule, Take 1 capsule (30 mg) by mouth every morning., Disp: 30 capsule, Rfl: 0   amphetamine-dextroamphetamine (ADDERALL) 20 MG tablet, Take 1 tablet (20 mg) by mouth in the afternoon., Disp: 30 tablet, Rfl: 0   cariprazine (VRAYLAR) 1.5 MG capsule, Take 1 capsule (1.5 mg total) by mouth daily., Disp: 30 capsule, Rfl: 3   cariprazine (VRAYLAR) 3 MG capsule, Take 1 capsule (3 mg total) by mouth daily., Disp: 30 capsule, Rfl: 1   lamoTRIgine (LAMICTAL) 200 MG tablet, Take 1 tablet (200 mg total) by mouth in the morning., Disp: 90 tablet, Rfl: 2    predniSONE (STERAPRED UNI-PAK 21 TAB) 5 MG (21) TBPK tablet, Take as directed, Disp: 1 tablet, Rfl: 0   sildenafil (VIAGRA) 50 MG tablet, Take 50 mg by mouth daily as needed., Disp: , Rfl:    silodosin (RAPAFLO) 8 MG CAPS capsule, Take 8 mg by mouth daily., Disp: , Rfl:    traZODone (DESYREL) 50 MG tablet, Take 1 tablet (50 mg total) by mouth at bedtime., Disp: 30 tablet, Rfl: 1 Medication Side Effects: none  Family Medical/ Social History: Changes? No  MENTAL HEALTH EXAM:  There were no vitals taken for this visit.There is no height or weight on file to calculate BMI.  General Appearance: Casual, Neat, and Well Groomed  Eye Contact:  Good  Speech:  Clear and Coherent and Talkative  Volume:  Normal  Mood:  Euphoric  Affect:  Non-Congruent and Flat  Thought Process:  Coherent  Orientation:  Full (Time, Place, and Person)  Thought Content: Logical and Rumination   Suicidal Thoughts:  No  Homicidal Thoughts:  No  Memory:  WNL  Judgement:  Fair  Insight:  Fair  Psychomotor Activity:  Normal  Concentration:  Concentration: Good  Recall:  Good  Fund of Knowledge: Good  Language: Good  Assets:  Desire for Improvement  ADL's:  Intact  Cognition: WNL  Prognosis:  Good    DIAGNOSES:    ICD-10-CM   1. Major depressive disorder, recurrent episode, moderate (HCC)  F33.1 amphetamine-dextroamphetamine (ADDERALL XR) 30  MG 24 hr capsule    amphetamine-dextroamphetamine (ADDERALL) 20 MG tablet    cariprazine (VRAYLAR) 1.5 MG capsule    2. Generalized anxiety disorder  F41.1 amphetamine-dextroamphetamine (ADDERALL XR) 30 MG 24 hr capsule    amphetamine-dextroamphetamine (ADDERALL) 20 MG tablet    cariprazine (VRAYLAR) 1.5 MG capsule    3. Bipolar I disorder, most recent episode mixed ([place])  F31.60 amphetamine-dextroamphetamine (ADDERALL XR) 30 MG 24 hr capsule    amphetamine-dextroamphetamine (ADDERALL) 20 MG tablet    cariprazine (VRAYLAR) 1.5 MG capsule    4. Insomnia due to other  mental disorder  F51.05    F99     5. Bipolar depression ([place])  F31.9 lamoTRIgine (LAMICTAL) 200 MG tablet      Receiving Psychotherapy: No    RECOMMENDATIONS:    Greater than 50% of 30  min face to face time with patient was spent on counseling and coordination of care. We reviewed again his long hx of bipolar disorder under the care of Dr. Chucky May. He says today he thinks the Arman Filter is working. He wants to save money and come in every six months. I told him I would need to see him at least in 4 months this first time stretching the visits out.   He is asking for 90 day supply of medication. He will call when running low on Xanax. He got his last fill from Dr. Starleen Arms old Rx and I canceled min. Told him we would have to at least reduce his xanax down to 1 mg three time daily with a plan in place to further reduce over time.  View previous note for more collateral.   We agreed today to: Since he filled another RX of Xanax from another provider, I will not prescribe a refill today. My RX was canceled for now.  Continue on Vraylar to 1.5 mg daily. capsule daily. Samples of 1.5 mg provided for two weeks. Instructed to utilized discount card.  Continue Adderall. 30 mg ER in the am and 20 mg IR in the afternoon. Educated Pt that 60 mg was my prescribing limit per day. He has previously been on up to 80 mg per day from previous provider.  Continue Trazodone 50 mg at bedtime daily Continue Lamictal 200 mg daily To report worsening symptoms or side effects promptly. Provided emergency contact information Discussed potential benefits, risks, and side effects of stimulants with patient to include increased heart rate, palpitations, insomnia, increased anxiety, increased irritability, or decreased appetite.  Instructed patient to contact office if experiencing any significant tolerability issues.  Discussed potential metabolic side effects associated with atypical antipsychotics, as well as  potential risk for movement side effects. Advised pt to contact office if movement side effects occur.   Discussed potential benefits, risk, and side effects of benzodiazepines to include potential risk of tolerance and dependence, as well as possible drowsiness.  Advised patient not to drive if experiencing drowsiness and to take lowest possible effective dose to minimize risk of dependence and tolerance.  Monitor for any sign of rash. Please taking Lamictal and contact office immediately rash develops. Recommend seeking urgent medical attention if rash is severe and/or spreading quickly.  Reviewed PDMP      Elwanda Brooklyn, NP

## 2022-04-01 NOTE — Progress Notes (Deleted)
ddd

## 2022-04-02 ENCOUNTER — Emergency Department (HOSPITAL_COMMUNITY)
Admission: EM | Admit: 2022-04-02 | Discharge: 2022-04-03 | Disposition: A | Discharge status: Home / Self Care | Payer: PPO | Attending: Emergency Medicine | Admitting: Emergency Medicine

## 2022-04-02 ENCOUNTER — Encounter (HOSPITAL_COMMUNITY): Payer: Self-pay | Admitting: Emergency Medicine

## 2022-04-02 ENCOUNTER — Emergency Department (HOSPITAL_COMMUNITY): Payer: PPO

## 2022-04-02 ENCOUNTER — Other Ambulatory Visit: Payer: Self-pay

## 2022-04-02 DIAGNOSIS — R079 Chest pain, unspecified: Secondary | ICD-10-CM | POA: Diagnosis not present

## 2022-04-02 DIAGNOSIS — F419 Anxiety disorder, unspecified: Secondary | ICD-10-CM | POA: Diagnosis not present

## 2022-04-02 DIAGNOSIS — R110 Nausea: Secondary | ICD-10-CM | POA: Diagnosis not present

## 2022-04-02 DIAGNOSIS — K5900 Constipation, unspecified: Secondary | ICD-10-CM | POA: Diagnosis not present

## 2022-04-02 DIAGNOSIS — I10 Essential (primary) hypertension: Secondary | ICD-10-CM | POA: Diagnosis not present

## 2022-04-02 DIAGNOSIS — I7121 Aneurysm of the ascending aorta, without rupture: Secondary | ICD-10-CM | POA: Insufficient documentation

## 2022-04-02 DIAGNOSIS — R1084 Generalized abdominal pain: Secondary | ICD-10-CM | POA: Diagnosis present

## 2022-04-02 DIAGNOSIS — R0789 Other chest pain: Secondary | ICD-10-CM | POA: Diagnosis not present

## 2022-04-02 DIAGNOSIS — R109 Unspecified abdominal pain: Secondary | ICD-10-CM | POA: Diagnosis not present

## 2022-04-02 DIAGNOSIS — N281 Cyst of kidney, acquired: Secondary | ICD-10-CM | POA: Diagnosis not present

## 2022-04-02 LAB — CBC
HCT: 48.6 % (ref 39.0–52.0)
Hemoglobin: 16.8 g/dL (ref 13.0–17.0)
MCH: 30 pg (ref 26.0–34.0)
MCHC: 34.6 g/dL (ref 30.0–36.0)
MCV: 86.8 fL (ref 80.0–100.0)
Platelets: 200 10*3/uL (ref 150–400)
RBC: 5.6 MIL/uL (ref 4.22–5.81)
RDW: 12.2 % (ref 11.5–15.5)
WBC: 15.3 10*3/uL — ABNORMAL HIGH (ref 4.0–10.5)
nRBC: 0 % (ref 0.0–0.2)

## 2022-04-02 LAB — COMPREHENSIVE METABOLIC PANEL
ALT: 13 U/L (ref 0–44)
AST: 25 U/L (ref 15–41)
Albumin: 4.5 g/dL (ref 3.5–5.0)
Alkaline Phosphatase: 76 U/L (ref 38–126)
Anion gap: 10 (ref 5–15)
BUN: 23 mg/dL (ref 8–23)
CO2: 26 mmol/L (ref 22–32)
Calcium: 9.6 mg/dL (ref 8.9–10.3)
Chloride: 104 mmol/L (ref 98–111)
Creatinine, Ser: 0.87 mg/dL (ref 0.61–1.24)
GFR, Estimated: >60 mL/min (ref >60)
Glucose, Bld: 119 mg/dL — ABNORMAL HIGH (ref 70–99)
Potassium: 4.2 mmol/L (ref 3.5–5.1)
Sodium: 140 mmol/L (ref 135–145)
Total Bilirubin: 0.8 mg/dL (ref 0.3–1.2)
Total Protein: 7.2 g/dL (ref 6.5–8.1)

## 2022-04-02 LAB — LIPASE, BLOOD: Lipase: 27 U/L (ref 11–51)

## 2022-04-02 LAB — TROPONIN I (HIGH SENSITIVITY): Troponin I (High Sensitivity): 4 ng/L (ref <18)

## 2022-04-02 MED ORDER — ACETAMINOPHEN 500 MG PO TABS
1000.0000 mg | ORAL_TABLET | Freq: Once | ORAL | Status: DC
Start: 2022-04-03 — End: 2022-04-03
  Filled 2022-04-02: qty 2

## 2022-04-02 MED ORDER — IOHEXOL 300 MG/ML  SOLN
100.0000 mL | Freq: Once | INTRAMUSCULAR | Status: AC | PRN
  Administered 2022-04-02: 100 mL via INTRAVENOUS

## 2022-04-02 NOTE — ED Provider Triage Note (Signed)
Emergency Medicine Provider Triage Evaluation Note  Paul Ray , a 66 y.o. male  was evaluated in triage.  Pt complains of left CP and L abd pain that started around noon today. States CP is not worsened with any particular movement or breathing.    Review of Systems  Positive: CP and abd pain Negative: Fever   Physical Exam  BP 134/76   Pulse 78   Temp 98.1 F (36.7 C) (Oral)   Resp 16   Ht '5\' 8"'$  (1.727 m)   Wt 75.8 kg   SpO2 99%   BMI 25.41 kg/m  Gen:   Awake, no distress   Resp:  Normal effort  MSK:   Moves extremities without difficulty  Other:  Diffuse abd TTP, voluntary guarding. Non-toxic appearing.   Medical Decision Making  Medically screening exam initiated at 5:28 PM.  Appropriate orders placed.  Paul Ray was informed that the remainder of the evaluation will be completed by another provider, this initial triage assessment does not replace that evaluation, and the importance of remaining in the ED until their evaluation is complete.  Labs, CT   Paul Ray, [place] 04/02/22 1733

## 2022-04-02 NOTE — ED Triage Notes (Signed)
Pt endorses abd pain and CP since about 1130 today. Dull pain in chest. States he feels constipated but did have BM today.

## 2022-04-03 ENCOUNTER — Telehealth: Payer: Self-pay | Admitting: Registered Nurse

## 2022-04-03 ENCOUNTER — Emergency Department (HOSPITAL_COMMUNITY): Payer: PPO

## 2022-04-03 DIAGNOSIS — I7121 Aneurysm of the ascending aorta, without rupture: Secondary | ICD-10-CM | POA: Diagnosis not present

## 2022-04-03 LAB — TROPONIN I (HIGH SENSITIVITY): Troponin I (High Sensitivity): 4 ng/L (ref <18)

## 2022-04-03 LAB — URINALYSIS, ROUTINE W REFLEX MICROSCOPIC
Bacteria, UA: NONE SEEN
Bilirubin Urine: NEGATIVE
Glucose, UA: NEGATIVE mg/dL
Hgb urine dipstick: NEGATIVE
Ketones, ur: 80 mg/dL — AB
Leukocytes,Ua: NEGATIVE
Nitrite: NEGATIVE
Protein, ur: NEGATIVE mg/dL
Specific Gravity, Urine: >1.046 — ABNORMAL HIGH (ref 1.005–1.030)
pH: 5 (ref 5.0–8.0)

## 2022-04-03 MED ORDER — DOCUSATE SODIUM 100 MG PO CAPS: 100.0000 mg | ORAL_CAPSULE | Freq: Two times a day (BID) | ORAL | 0 refills | Status: AC

## 2022-04-03 MED ORDER — KETOROLAC TROMETHAMINE 15 MG/ML IJ SOLN: 15.0000 mg | Freq: Once | INTRAMUSCULAR | Status: DC

## 2022-04-03 MED ORDER — MORPHINE SULFATE (PF) 4 MG/ML IV SOLN
4.0000 mg | Freq: Once | INTRAVENOUS | Status: AC
  Administered 2022-04-03: 4 mg via INTRAVENOUS
  Filled 2022-04-03: qty 1

## 2022-04-03 MED ORDER — SODIUM CHLORIDE 0.9 % IV BOLUS
1000.0000 mL | Freq: Once | INTRAVENOUS | Status: AC
  Administered 2022-04-03: 1000 mL via INTRAVENOUS

## 2022-04-03 MED ORDER — POLYETHYLENE GLYCOL 3350 17 GM/SCOOP PO POWD: 1.0000 | Freq: Once | ORAL | 0 refills | Status: AC

## 2022-04-03 MED ORDER — HYDROMORPHONE HCL 1 MG/ML IJ SOLN: 1.0000 mg | Freq: Once | INTRAMUSCULAR | Status: DC

## 2022-04-03 MED ORDER — IOHEXOL 350 MG/ML SOLN
100.0000 mL | Freq: Once | INTRAVENOUS | Status: AC | PRN
Start: 2022-04-03 — End: 2022-04-03
  Administered 2022-04-03: 75 mL via INTRAVENOUS

## 2022-04-03 MED ORDER — HYDROMORPHONE HCL 1 MG/ML IJ SOLN
1.0000 mg | Freq: Once | INTRAMUSCULAR | Status: AC
  Administered 2022-04-03: 1 mg via INTRAVENOUS
  Filled 2022-04-03: qty 1

## 2022-04-03 NOTE — Telephone Encounter (Signed)
Initial Comment Caller states that he has severe stomach pain. It has been hurting for the last 2 hours. He thinks he may be constipated. Translation No Nurse Assessment Nurse: Ysidro Evert, RN, Levada Dy Date/Time (Eastern Time): 04/02/2022 2:11:53 PM Confirm and document reason for call. If symptomatic, describe symptoms. ---Caller states he is having severe abdominal pain Does the patient have any new or worsening symptoms? ---Yes Will a triage be completed? ---Yes Related visit to physician within the last 2 weeks? ---No Does the PT have any chronic conditions? (i.e. diabetes, asthma, this includes High risk factors for pregnancy, etc.) ---No Is this a behavioral health or substance abuse call? ---No Guidelines Guideline Title Affirmed Question Affirmed Notes Nurse Date/Time (Eastern Time) Abdominal Pain - Male [1] SEVERE pain (e.g., excruciating) AND [2] present > 1 hour Ysidro Evert, RN, Levada Dy 04/02/2022 2:12:38 PM Disp. Time Eilene Ghazi Time) Disposition Final User 04/02/2022 2:09:52 PM Send to Urgent Halford Decamp 04/02/2022 2:14:07 PM Go to ED Now Yes Ysidro Evert, RN, Levada Dy PLEASE NOTE: All timestamps contained within this report are represented as [place] Standard Time. CONFIDENTIALTY NOTICE: This fax transmission is intended only for the addressee. It contains information that is legally privileged, confidential or otherwise protected from use or disclosure. If you are not the intended recipient, you are strictly prohibited from reviewing, disclosing, copying using or disseminating any of this information or taking any action in reliance on or regarding this information. If you have received this fax in error, please notify us immediately by telephone so that we can arrange for its return to [place]. Phone: [phone], Toll-Free: [phone], Fax: [phone] Page: 2 of 2 Call Id: [id number] Final Disposition 04/02/2022 2:14:07 PM Go to ED Now Yes Ysidro Evert, RN, Marin Shutter  Disagree/Comply Comply Caller Understands Yes PreDisposition Did not know what to do Care Advice Given Per Guideline GO TO ED NOW: * You need to be seen in the Emergency Department. * Go to the ED at ___________ [place] now. Drive carefully. ANOTHER ADULT SHOULD DRIVE: * It is better and safer if another adult drives instead of you. NOTHING BY MOUTH: * Do not eat or drink anything for now. CARE ADVICE given per Abdominal Pain - Male (Adult) guideline.

## 2022-04-03 NOTE — ED Notes (Signed)
RN reviewed discharge instructions with pt. Pt verbalized understanding and had no further questions. VSS upon discharge.  

## 2022-04-03 NOTE — Discharge Instructions (Addendum)
Your CT scan showed that you are very constipated.  Mix the entire MiraLAX bottle with 2 L of Gatorade.  Drink 1 cup of this every hour until the mixture is complete.  This function similarly to a colonoscopy cleanout.  Be sure to increase the fiber in your diet or take a supplement as well as stay very hydrated to avoid further constipation.  Additionally, I have sent a medication called docusate to your pharmacy.  You may use this as well over the next week.  Most importantly, follow-up with your primary care provider about your constipation.  Additionally, your aneurysm appears slightly larger than last year.  Please call your cardiology office and see them as soon as possible.

## 2022-04-03 NOTE — ED Provider Notes (Signed)
[facility] EMERGENCY DEPARTMENT Provider Note   CSN: [id number] Arrival date & time: 04/02/22  1653     History  Chief Complaint  Patient presents with   Abdominal Pain   Chest Pain    Paul Ray is a 66 y.o. male with a past medical history of hypertension, ascending thoracic AAA and alcohol use disorder presenting today with both abdominal and chest pain he says that it started yesterday at 11:30.  Feels like he is "backed up."  Says that yesterday he had a bowel movement but that it was "just a little bit."  Also believes he may be dehydrated because his urine is "at the bottom of the toilet."  No opioid medication use at home.  No associated shortness of breath or palpitations.  Endorses nausea but no vomiting.     Abdominal Pain Associated symptoms: chest pain   Chest Pain Associated symptoms: abdominal pain        Home Medications Prior to Admission medications   Medication Sig Start Date End Date Taking? Authorizing Provider  alprazolam Duanne Moron) 2 MG tablet Take 1 tablet (2 mg total) by mouth 2 (two) times daily as needed for sleep. 01/16/22   Wendie Agreste, MD  amphetamine-dextroamphetamine (ADDERALL XR) 30 MG 24 hr capsule Take 1 capsule (30 mg) by mouth every morning. 04/01/22   Elwanda Brooklyn, NP  amphetamine-dextroamphetamine (ADDERALL) 20 MG tablet Take 1 tablet (20 mg) by mouth in the afternoon. 04/01/22   Elwanda Brooklyn, NP  cariprazine (VRAYLAR) 1.5 MG capsule Take 1 capsule (1.5 mg total) by mouth daily. 04/01/22   Elwanda Brooklyn, NP  cariprazine (VRAYLAR) 3 MG capsule Take 1 capsule (3 mg total) by mouth daily. 01/20/22   Elwanda Brooklyn, NP  lamoTRIgine (LAMICTAL) 200 MG tablet Take 1 tablet (200 mg total) by mouth in the morning. 04/01/22   Elwanda Brooklyn, NP  predniSONE (STERAPRED UNI-PAK 21 TAB) 5 MG (21) TBPK tablet Take as directed 01/09/22   Marybelle Killings, MD  sildenafil (VIAGRA) 50 MG tablet Take 50 mg by mouth daily as needed.    [provider]  silodosin (RAPAFLO) 8 MG CAPS capsule Take 8 mg by mouth daily. 12/08/21   [provider]  traZODone (DESYREL) 50 MG tablet Take 1 tablet (50 mg total) by mouth at bedtime. 01/20/22   Elwanda Brooklyn, NP      Allergies    Quinolones    Review of Systems   Review of Systems  Cardiovascular:  Positive for chest pain.  Gastrointestinal:  Positive for abdominal pain.    Physical Exam Updated Vital Signs BP 113/70 (BP Location: Right Arm)   Pulse 77   Temp 98.2 F (36.8 C) (Oral)   Resp 17   Ht '5\' 8"'$  (1.727 m)   Wt 75.8 kg   SpO2 97%   BMI 25.41 kg/m  Physical Exam Vitals and nursing note reviewed.  Constitutional:      Appearance: Normal appearance. He is not ill-appearing or diaphoretic.  HENT:     Head: Normocephalic and atraumatic.  Eyes:     General: No scleral icterus.    Conjunctiva/sclera: Conjunctivae normal.  Cardiovascular:     Rate and Rhythm: Normal rate and regular rhythm.     Heart sounds: No murmur heard. Pulmonary:     Effort: Pulmonary effort is normal. No respiratory distress.  Abdominal:     General: Abdomen is flat.     Palpations:  Abdomen is soft.     Tenderness: There is generalized abdominal tenderness.     Hernia: No hernia is present.  Skin:    General: Skin is warm and dry.     Findings: No rash.  Neurological:     Mental Status: He is alert.  Psychiatric:        Mood and Affect: Mood is anxious.     ED Results / Procedures / Treatments   Labs (all labs ordered are listed, but only abnormal results are displayed) Labs Reviewed  COMPREHENSIVE METABOLIC PANEL - Abnormal; Notable for the following components:      Result Value   Glucose, Bld 119 (*)    All other components within normal limits  CBC - Abnormal; Notable for the following components:   WBC 15.3 (*)    All other components within normal limits  URINALYSIS, ROUTINE W REFLEX MICROSCOPIC - Abnormal; Notable for the following components:   Color,  Urine AMBER (*)    Specific Gravity, Urine >1.046 (*)    Ketones, ur 80 (*)    All other components within normal limits  LIPASE, BLOOD  TROPONIN I (HIGH SENSITIVITY)  TROPONIN I (HIGH SENSITIVITY)    EKG EKG Interpretation  Date/Time:  Thursday April 03 2022 04:31:52 EDT Ventricular Rate:  72 PR Interval:  174 QRS Duration: 101 QT Interval:  426 QTC Calculation: 467 R Axis:   -27 Text Interpretation: Sinus rhythm Borderline left axis deviation Low voltage, precordial leads Baseline wander in lead(s) V4 no stemi Confirmed by Wynona Dove ([id number]) on 04/03/2022 4:39:55 AM  Radiology CT ABDOMEN PELVIS W CONTRAST  Result Date: 04/02/2022 CLINICAL DATA:  Abdominal pain, acute, nonlocalized EXAM: CT ABDOMEN AND PELVIS WITH CONTRAST TECHNIQUE: Multidetector CT imaging of the abdomen and pelvis was performed using the standard protocol following bolus administration of intravenous contrast. RADIATION DOSE REDUCTION: This exam was performed according to the departmental dose-optimization program which includes automated exposure control, adjustment of the mA and/or kV according to patient size and/or use of iterative reconstruction technique. CONTRAST:  127m OMNIPAQUE IOHEXOL 300 MG/ML  SOLN COMPARISON:  None Available. FINDINGS: Lower chest: The visualized lung bases are clear. Mild cardiomegaly. No pericardial effusion. Small hiatal hernia. Hepatobiliary: No focal liver abnormality is seen. No gallstones, gallbladder wall thickening, or biliary dilatation. Pancreas: Unremarkable Spleen: Unremarkable Adrenals/Urinary Tract: The adrenal glands are unremarkable. Simple cortical cysts are seen within the right kidney. No follow-up imaging is recommended for these lesions. The kidneys are otherwise unremarkable. The bladder is obscured by streak artifact from bilateral hip arthroplasty. Stomach/Bowel: Large volume stool throughout the colon extending into the rectum. No evidence of obstruction. Stomach,  small bowel, and large bowel are otherwise unremarkable. Appendix is not clearly identified and may be absent. No free intraperitoneal gas or fluid. Vascular/Lymphatic: No significant vascular findings are present. No enlarged abdominal or pelvic lymph nodes. Reproductive: Obscured by streak artifact Other: No abdominal wall hernia. Musculoskeletal: Bilateral hip arthroplasty has been performed. Degenerative changes are seen within the lumbar spine. No acute bone abnormality. No lytic or blastic bone lesion. IMPRESSION: 1. No acute intra-abdominal pathology identified. No definite radiographic explanation for the patient's reported symptoms. 2. Large volume stool throughout the colon extending into the rectum. No evidence of obstruction. 3. Mild cardiomegaly. Electronically Signed   By: AFidela SalisburyM.D.   On: 04/02/2022 21:24   DG Chest 2 View  Result Date: 04/02/2022 CLINICAL DATA:  Chest and abdominal pain. EXAM: CHEST - 2 VIEW COMPARISON:  Chest radiograph and CT 11/09/2020 FINDINGS: Normal heart size with stable mediastinal contours. Patient with known ascending aortic aneurysm. Chronic blunting of right costophrenic angle with mild scarring at the right lung base. No acute airspace disease. No pulmonary edema. No pneumothorax or large pleural effusion. No acute osseous findings. IMPRESSION: No acute chest findings. Electronically Signed   By: Keith Rake M.D.   On: 04/02/2022 18:02    Procedures Procedures   Medications Ordered in ED Medications  acetaminophen (TYLENOL) tablet 1,000 mg (has no administration in time range)  iohexol (OMNIPAQUE) 300 MG/ML solution 100 mL (100 mLs Intravenous Contrast Given 04/02/22 2102)    ED Course/ Medical Decision Making/ A&P                           Medical Decision Making Amount and/or Complexity of Data Reviewed Radiology: ordered.  Risk OTC drugs. Prescription drug management.   This is a 66 year old male with a past medical history of  thoracic AAA who presents to the ED for concern of generalized abdominal and chest pain "right under the rib cage."  Differential includes but is not limited to ACS, PE, constipation, medication side effect, dissection, ruptured AAA.   This is not an exhaustive differential.    Past Medical History / Co-morbidities / Social History: Ascending thoracic AAA.  Last imaged in 2022.   Additional history: Per chart review patient had a cardiac CT in 2021 which was revealing of an ascending thoracic aortic aneurysm measuring 4.8 cm.  He was seen by cardiology outpatient who recommended follow-up in 1 year.  He followed up in May 2022 and had a benign work-up.  Has not been seen in 2023.   Physical Exam: Pertinent physical exam findings include Generalized tenderness to the abdomen RRR, no murmurs, gallops or rubs  Lab Tests: I ordered, and personally interpreted labs.  The pertinent results include: Negative lipase White blood cell count 15.3  Imaging Studies: Chest x-ray and CT abdomen pelvis ordered in triage.  I independently visualized and interpreted this and patient appears to be constipated.  Also with noted AAA on chest x-ray.  I agree with the radiologist.  Due to patient's history of AAA and no recent imaging, CT aortogram was ordered.  I viewed and interpreted this showed minimal increase in AAA from 4.7-> 4.9   Cardiac Monitoring:  The patient was maintained on a cardiac monitor.  My attending physician Dr. Pearline Cables viewed and interpreted the cardiac monitored which showed an underlying rhythm of: Normal sinus   Medications: I ordered medication including morphine. Reevaluation of the patient after these medicines showed that the patient improved. Still had some pain so dilaudid given prior to dc.     MDM/Disposition: This is a 66 year old male presenting today with generalized abdominal pain and lower chest pain.  Troponins negative.  Leukocytosis to 15.3, likely in setting of  inflammation.  Noted to be constipated on CT.  Does not appear to be impacted and does not require disimpaction today.  He has not tried anything over-the-counter for constipation.  I will instruct him how to do a bowel cleanse with MiraLAX.  Also will send docusate to his pharmacy.  He will need to follow-up with PCP about this.  Aortogram negative for rupture/dissection.  I encouraged him to follow-up with his cardiologist as he missed his annual scan this year.  They will be able to see his scan that was done in the emergency department  and dispo him accordingly.  After patient's work-up today, I feel that he is stable for dc home with docusate and miralax. Sent to his pharmacy. Enema offered but patient says he will treat this at home.     I discussed this case with my attending physician Dr. Ursula Beath who cosigned this note including patient's presenting symptoms, physical exam, and planned diagnostics and interventions. Attending physician stated agreement with plan or made changes to plan which were implemented.      Final Clinical Impression(s) / ED Diagnoses Final diagnoses:  Constipation, unspecified constipation type  Aneurysm of ascending aorta without rupture ([place])    Rx / DC Orders ED Discharge Orders          Ordered    polyethylene glycol powder (GLYCOLAX/MIRALAX) 17 GM/SCOOP powder   Once        04/03/22 0308    docusate sodium (COLACE) 100 MG capsule  Every 12 hours        04/03/22 0308           Results and diagnoses were explained to the patient. Return precautions discussed in full. Patient had no additional questions and expressed complete understanding.   This chart was dictated using voice recognition software.  Despite best efforts to proofread,  errors can occur which can change the documentation meaning.    Rhae Hammock, PA-C 04/03/22 [place], [place], DO 04/06/22 2358

## 2022-04-14 ENCOUNTER — Telehealth: Payer: Self-pay | Admitting: Behavioral Health

## 2022-04-14 NOTE — Telephone Encounter (Signed)
He said he is supposed to go to '1mg'$  3 times a day.

## 2022-04-14 NOTE — Telephone Encounter (Signed)
Pt would like refill of Xanax sent to [facility].  Next appt 1/31

## 2022-04-16 ENCOUNTER — Other Ambulatory Visit: Payer: Self-pay

## 2022-04-16 ENCOUNTER — Telehealth: Payer: Self-pay

## 2022-04-16 DIAGNOSIS — G479 Sleep disorder, unspecified: Secondary | ICD-10-CM

## 2022-04-16 MED ORDER — ALPRAZOLAM 1 MG PO TABS: 1.0000 mg | ORAL_TABLET | Freq: Three times a day (TID) | ORAL | 2 refills | Status: DC | PRN

## 2022-04-16 NOTE — Telephone Encounter (Signed)
I contacted pt's pharmacy and they reported 2 Xanax 1 mg Rx's were sent in today from [place], one #60 and one #90. They filled #60 but said pt could pick up his new Rx #90 in 20 days, he does have plenty till then.   I informed pt of issue, advised him to take 1 mg tid then in about 20 days he could fill the Rx on file. He was appreciative and will call if there are any issues.

## 2022-04-16 NOTE — Telephone Encounter (Signed)
Pt called back at 1:49p.  He only got 60 pills from the pharmacy.  I sent instant message to Traci to call pt back and explain what happened.  Next appt 1/31

## 2022-04-16 NOTE — Telephone Encounter (Signed)
Yes, I will take over if he would like. I have explained that I will not write more than 3 mg total per day. Just want to make sure that Dr. Does not continue. So yes, please call cancel Dr. Vonna Kotyk Rx

## 2022-04-16 NOTE — Telephone Encounter (Signed)
Sent message to Tressia Miners about this and it will be handled. Thank you.

## 2022-04-17 NOTE — Telephone Encounter (Signed)
Noted! Thank you

## 2022-05-07 ENCOUNTER — Telehealth: Payer: Self-pay | Admitting: Behavioral Health

## 2022-05-07 NOTE — Telephone Encounter (Signed)
Pt LVM at 2:59p.  I returned call and LVM. He returned call again at 5pm.  He say he needs his generic Adderall refilled. He says the price of the medicine changes.  But he can't do a price comparison to see where to get it from because the pharmacy doesn't have the script early enough to run it through his insurance.  He wanted the additional scripts sent to the pharmacy with future dates on them, so the pharmacy can give him the price before it's time for the refill.    Next appt 1/31

## 2022-05-08 ENCOUNTER — Other Ambulatory Visit: Payer: Self-pay

## 2022-05-08 DIAGNOSIS — F411 Generalized anxiety disorder: Secondary | ICD-10-CM

## 2022-05-08 DIAGNOSIS — F331 Major depressive disorder, recurrent, moderate: Secondary | ICD-10-CM

## 2022-05-08 DIAGNOSIS — F3160 Bipolar disorder, current episode mixed, unspecified: Secondary | ICD-10-CM

## 2022-05-08 MED ORDER — AMPHETAMINE-DEXTROAMPHETAMINE 20 MG PO TABS: ORAL_TABLET | ORAL | 0 refills | Status: DC

## 2022-05-08 MED ORDER — AMPHETAMINE-DEXTROAMPHET ER 30 MG PO CP24: ORAL_CAPSULE | ORAL | 0 refills | Status: DC

## 2022-05-08 NOTE — Telephone Encounter (Signed)
Pended.

## 2022-07-02 ENCOUNTER — Telehealth: Payer: Self-pay | Admitting: Behavioral Health

## 2022-07-02 NOTE — Telephone Encounter (Addendum)
[facility] and [facility] for [facility]all. Will repend to [facility]. Last filled 11/16, not due until 12/14. Will postpone.

## 2022-07-02 NOTE — Telephone Encounter (Signed)
Pt called requesting generic adderall xr 30 mg and generic adderall 20 mg to [facility] [address] Cost less at [facility] per pt. Due for RF tomorrow.

## 2022-07-09 ENCOUNTER — Other Ambulatory Visit: Payer: Self-pay

## 2022-07-09 DIAGNOSIS — F331 Major depressive disorder, recurrent, moderate: Secondary | ICD-10-CM

## 2022-07-09 DIAGNOSIS — F3160 Bipolar disorder, current episode mixed, unspecified: Secondary | ICD-10-CM

## 2022-07-09 DIAGNOSIS — F411 Generalized anxiety disorder: Secondary | ICD-10-CM

## 2022-07-09 MED ORDER — AMPHETAMINE-DEXTROAMPHET ER 30 MG PO CP24: ORAL_CAPSULE | ORAL | 0 refills | Status: DC

## 2022-07-09 MED ORDER — AMPHETAMINE-DEXTROAMPHETAMINE 20 MG PO TABS: ORAL_TABLET | ORAL | 0 refills | Status: DC

## 2022-07-09 NOTE — Telephone Encounter (Signed)
Pended for 12/14.

## 2022-07-28 ENCOUNTER — Other Ambulatory Visit: Payer: Self-pay | Admitting: Behavioral Health

## 2022-07-28 DIAGNOSIS — G479 Sleep disorder, unspecified: Secondary | ICD-10-CM

## 2022-08-06 ENCOUNTER — Other Ambulatory Visit: Payer: Self-pay

## 2022-08-06 ENCOUNTER — Telehealth: Payer: Self-pay | Admitting: Behavioral Health

## 2022-08-06 DIAGNOSIS — F331 Major depressive disorder, recurrent, moderate: Secondary | ICD-10-CM

## 2022-08-06 DIAGNOSIS — F411 Generalized anxiety disorder: Secondary | ICD-10-CM

## 2022-08-06 DIAGNOSIS — F3160 Bipolar disorder, current episode mixed, unspecified: Secondary | ICD-10-CM

## 2022-08-06 MED ORDER — AMPHETAMINE-DEXTROAMPHET ER 30 MG PO CP24: ORAL_CAPSULE | ORAL | 0 refills | Status: DC

## 2022-08-06 MED ORDER — AMPHETAMINE-DEXTROAMPHETAMINE 20 MG PO TABS: ORAL_TABLET | ORAL | 0 refills | Status: DC

## 2022-08-06 NOTE — Telephone Encounter (Signed)
Pended.

## 2022-08-06 NOTE — Telephone Encounter (Signed)
Paul Ray lvm requesting refills on his Adderall 20 mg and 30 mg XR.  Next appointment 08/27/22 Contact information # [phone]

## 2022-08-27 ENCOUNTER — Ambulatory Visit (INDEPENDENT_AMBULATORY_CARE_PROVIDER_SITE_OTHER): Payer: PPO | Admitting: Behavioral Health

## 2022-08-27 ENCOUNTER — Encounter: Payer: Self-pay | Admitting: Behavioral Health

## 2022-08-27 DIAGNOSIS — F411 Generalized anxiety disorder: Secondary | ICD-10-CM | POA: Diagnosis not present

## 2022-08-27 DIAGNOSIS — F319 Bipolar disorder, unspecified: Secondary | ICD-10-CM

## 2022-08-27 NOTE — Progress Notes (Signed)
[facility] Med Check  Patient ID: Paul Ray,  MRN: [id number]  PCP: Maximiano Coss, NP  Date of Evaluation: 08/27/2022 Time spent:20 minutes  Chief Complaint:  Chief Complaint   Depression; Anxiety; Follow-up; Patient Education; Stress; Relationship Problem     HISTORY/CURRENT STATUS: HPI 67 year old male presents to this office for follow up and medication management. He is very calm an collected. Cooperative and very nice today. He talks about difficulty with breaking up with his girlfriend but feels like he is getting better. He is still happy with how his medication is working. Requesting no changes this visit.  His anxiety today is 2/10 and depression is 1/10.  He insist on 6 mo f/u's. He is sleeping 6-7 hours per night.  Trazodone help a little. He denies current mania, no current psychosis, no SI/HI. He says he will request medical records.   Prior psychiatric medications as reported: Zoloft Wellbutrin Prozac Abilify Seroquel Zyprexa   Individual Medical History/ Review of Systems: Changes? :No   Allergies: Quinolones  Current Medications:  Current Outpatient Medications:    ALPRAZolam (XANAX) 1 MG tablet, TAKE 1 TABLET BY MOUTH 3 TIMES A DAY AS NEEDED FOR SLEEP, Disp: 90 tablet, Rfl: 1   amphetamine-dextroamphetamine (ADDERALL XR) 30 MG 24 hr capsule, Take 1 capsule (30 mg) by mouth every morning., Disp: 30 capsule, Rfl: 0   amphetamine-dextroamphetamine (ADDERALL XR) 30 MG 24 hr capsule, Take 1 capsule (30 mg) by mouth every morning., Disp: 30 capsule, Rfl: 0   amphetamine-dextroamphetamine (ADDERALL XR) 30 MG 24 hr capsule, Take 1 capsule (30 mg) by mouth every morning., Disp: 30 capsule, Rfl: 0   amphetamine-dextroamphetamine (ADDERALL) 20 MG tablet, Take 1 tablet (20 mg) by mouth in the afternoon., Disp: 30 tablet, Rfl: 0   amphetamine-dextroamphetamine (ADDERALL) 20 MG tablet, Take 1 tablet (20 mg) by mouth in the afternoon., Disp: 30 tablet, Rfl: 0    amphetamine-dextroamphetamine (ADDERALL) 20 MG tablet, Take 1 tablet (20 mg) by mouth in the afternoon., Disp: 30 tablet, Rfl: 0   cariprazine (VRAYLAR) 1.5 MG capsule, Take 1 capsule (1.5 mg total) by mouth daily., Disp: 30 capsule, Rfl: 3   cariprazine (VRAYLAR) 3 MG capsule, Take 1 capsule (3 mg total) by mouth daily., Disp: 30 capsule, Rfl: 1   lamoTRIgine (LAMICTAL) 200 MG tablet, Take 1 tablet (200 mg total) by mouth in the morning., Disp: 90 tablet, Rfl: 2   predniSONE (STERAPRED UNI-PAK 21 TAB) 5 MG (21) TBPK tablet, Take as directed, Disp: 1 tablet, Rfl: 0   sildenafil (VIAGRA) 50 MG tablet, Take 50 mg by mouth daily as needed., Disp: , Rfl:    silodosin (RAPAFLO) 8 MG CAPS capsule, Take 8 mg by mouth daily., Disp: , Rfl:    traZODone (DESYREL) 50 MG tablet, Take 1 tablet (50 mg total) by mouth at bedtime., Disp: 30 tablet, Rfl: 1 Medication Side Effects: none  Family Medical/ Social History: Changes? No  MENTAL HEALTH EXAM:  There were no vitals taken for this visit.There is no height or weight on file to calculate BMI.  General Appearance: Casual, Neat, and Well Groomed  Eye Contact:  Good  Speech:  Clear and Coherent  Volume:  Normal  Mood:  Depressed  Affect:  Appropriate  Thought Process:  Coherent  Orientation:  Full (Time, Place, and Person)  Thought Content: Logical   Suicidal Thoughts:  No  Homicidal Thoughts:  No  Memory:  WNL  Judgement:  Fair  Insight:  Fair  Psychomotor  Activity:  Normal  Concentration:  Concentration: Fair  Recall:  [employer] of Knowledge: Fair  Language: Fair  Assets:  Desire for Improvement  ADL's:  Intact  Cognition: WNL  Prognosis:  Fair    DIAGNOSES: No diagnosis found.  Receiving Psychotherapy: No    RECOMMENDATIONS:   Greater than 50% of 30  min face to face time with patient was spent on counseling and coordination of care.  He is calm and collected this visit which is pleasant to see with him. We discussed his recent  break up with long time girlfriend. He is currently very happy with his medications and not requesting any changes. Would like to continue with 6 mo f/u.    We agreed today to:  Continue on Vraylar 3 mg daily. capsule daily.  Continue Adderall. 30 mg ER in the am and 20 mg IR in the afternoon. Educated Pt that 60 mg was my prescribing limit per day.  Continue Trazodone 50 mg at bedtime daily Continue Lamictal 200 mg daily To report worsening symptoms or side effects promptly. Provided emergency contact information Discussed potential benefits, risks, and side effects of stimulants with patient to include increased heart rate, palpitations, insomnia, increased anxiety, increased irritability, or decreased appetite.  Instructed patient to contact office if experiencing any significant tolerability issues.  Discussed potential metabolic side effects associated with atypical antipsychotics, as well as potential risk for movement side effects. Advised pt to contact office if movement side effects occur.   Discussed potential benefits, risk, and side effects of benzodiazepines to include potential risk of tolerance and dependence, as well as possible drowsiness.  Advised patient not to drive if experiencing drowsiness and to take lowest possible effective dose to minimize risk of dependence and tolerance.  Monitor for any sign of rash. Please taking Lamictal and contact office immediately rash develops. Recommend seeking urgent medical attention if rash is severe and/or spreading quickly.  Reviewed PDMP      Elwanda Brooklyn, NP

## 2022-09-04 ENCOUNTER — Encounter (HOSPITAL_COMMUNITY): Payer: Self-pay

## 2022-09-04 ENCOUNTER — Telehealth: Payer: Self-pay | Admitting: Family Medicine

## 2022-09-04 ENCOUNTER — Telehealth: Payer: Self-pay | Admitting: Behavioral Health

## 2022-09-04 ENCOUNTER — Other Ambulatory Visit: Payer: Self-pay

## 2022-09-04 ENCOUNTER — Ambulatory Visit (HOSPITAL_COMMUNITY)
Admission: RE | Admit: 2022-09-04 | Discharge: 2022-09-04 | Disposition: A | Discharge status: Home / Self Care | Payer: PPO | Source: Ambulatory Visit | Attending: Internal Medicine | Admitting: Internal Medicine

## 2022-09-04 VITALS — BP 135/76 | HR 78 | Temp 98.6°F | Resp 16 | Ht 68.0 in | Wt 160.0 lb

## 2022-09-04 DIAGNOSIS — F3160 Bipolar disorder, current episode mixed, unspecified: Secondary | ICD-10-CM

## 2022-09-04 DIAGNOSIS — F331 Major depressive disorder, recurrent, moderate: Secondary | ICD-10-CM

## 2022-09-04 DIAGNOSIS — H01002 Unspecified blepharitis right lower eyelid: Secondary | ICD-10-CM | POA: Diagnosis not present

## 2022-09-04 DIAGNOSIS — F411 Generalized anxiety disorder: Secondary | ICD-10-CM

## 2022-09-04 MED ORDER — EYE WASH OP SOLN
OPHTHALMIC | Status: AC
  Filled 2022-09-04: qty 118

## 2022-09-04 MED ORDER — TETRACAINE HCL 0.5 % OP SOLN
OPHTHALMIC | Status: AC
  Filled 2022-09-04: qty 4

## 2022-09-04 MED ORDER — ERYTHROMYCIN 5 MG/GM OP OINT: TOPICAL_OINTMENT | Freq: Two times a day (BID) | OPHTHALMIC | 0 refills | Status: AC

## 2022-09-04 MED ORDER — FLUORESCEIN SODIUM 1 MG OP STRP
ORAL_STRIP | OPHTHALMIC | Status: AC
  Filled 2022-09-04: qty 1

## 2022-09-04 MED ORDER — AMPHETAMINE-DEXTROAMPHET ER 30 MG PO CP24: ORAL_CAPSULE | ORAL | 0 refills | Status: DC

## 2022-09-04 MED ORDER — AMPHETAMINE-DEXTROAMPHETAMINE 20 MG PO TABS: ORAL_TABLET | ORAL | 0 refills | Status: DC

## 2022-09-04 NOTE — Telephone Encounter (Signed)
I called and spoke with the patient and let him know that while we can not get him in at the moment he calls in, we try our best to get patients in our same day spots if we have any or within a day or two for non urgent matters. I also explained to him that he may see providers here since he is an established [facility] patient for acute issues, but he also needs to have a PCP. He has already scheduled an appt with JoAnna to establish and I fit him in to see [place] tomorrow for his acute eye issue

## 2022-09-04 NOTE — Telephone Encounter (Signed)
Please review

## 2022-09-04 NOTE — Discharge Instructions (Addendum)
Please apply medication twice daily Warm compress over the lower eyelids Return to the urgent care if you have worsening symptoms.

## 2022-09-04 NOTE — ED Triage Notes (Signed)
Chief Complaint: right eye irritation and redness. Patient does work in [employer], unsure if something got into the eye. No vision changes.  Onset: 1 week.   Prescriptions or OTC medications tried: Yes- refresh eye drops     with mild relief

## 2022-09-04 NOTE — Telephone Encounter (Signed)
Pt has called multiple times asking to be 'fit it'. He has been explained that he's Dr Orland Mustard pt and needs to est care. After 4th call he finally made new pt appt. Now he wants to speak to [facility].

## 2022-09-04 NOTE — Telephone Encounter (Signed)
Paul Ray called at 9:50 to request refills of both of his Adderall prescriptions.  Appt 03/02/23. Send to [facility] on [address]

## 2022-09-04 NOTE — Telephone Encounter (Signed)
Pended.

## 2022-09-04 NOTE — ED Provider Notes (Signed)
[place]    CSN: [id number] Arrival date & time: 09/04/22  1711      History   Chief Complaint Chief Complaint  Patient presents with   Eye Problem    HPI Paul Ray is a 67 y.o. male comes to the urgent care with sensation of foreign body in the right lower eyelid.  Symptoms started a week ago and has been persistent.  Patient used an eyewash with no significant improvement.  Patient complains of some irritation and redness of the right lower eyelid.  He is beginning to have similar symptoms in the left eye as well.  No fever or chills.  No purulent discharge from either eyes.  No sick contacts.  Patient denies any headaches or light sensitivity.  HPI  Past Medical History:  Diagnosis Date   Abscess of lung(513.0)    Right lower lobe   ADD (attention deficit disorder)    Anxiety    Arthritis    Ascending aortic aneurysm (HCC)    93m by Chest CTA 05/2020 and 443mby echo 05/2020   Benign essential HTN    Bipolar depression ([facility]   Coronary artery calcification seen on CAT scan    Empyema lung (HCC)    Hemorrhoids    Hepatitis C    Pneumonia    last year   Prostate abscess    Viral pericarditis    after COVID 19 infection    Patient Active Problem List   Diagnosis Date Noted   History of lumbar laminectomy for spinal cord decompression 01/08/2022   Spinal stenosis of lumbar region 07/25/2021   Foraminal stenosis of cervical region 03/08/2021   Viral pericarditis    Alcohol use disorder, severe, dependence ([facility]05/06/2021   Chest pain 11/09/2020   Ascending aortic aneurysm (HCC)    Benign essential HTN    Coronary artery calcification seen on CAT scan    Psychoactive substance-induced psychosis ([facility]   Rib pain 07/01/2017   Closed fracture of one rib of left side 07/01/2017   Chronic right shoulder pain 05/04/2017   Osteoarthritis of right hip 12/02/2016   Lumbar radiculopathy 04/17/2014   Chronic low back pain 08/16/2013   Lumbar  post-laminectomy syndrome 08/16/2013   Lumbosacral radiculitis 08/16/2013   Impingement syndrome of right shoulder 07/05/2013   Empyema lung (HCC)    Abscess of lung(513.0)    Bipolar depression ([facility]   ADD (attention deficit disorder)    Hepatitis C    Hip pain 05/02/2011   History of total hip arthroplasty 04/23/2011   Lung mass 10/22/2010   Pleural effusion 10/15/2010    Past Surgical History:  Procedure Laterality Date   ELBOW SURGERY  2005   Right   FOOT NEUROMA SURGERY  2006   Left   [facility]     [facility]   LUMBAR LAMINECTOMY/DECOMPRESSION MICRODISCECTOMY  07/24/2011   Procedure: LUMBAR LAMINECTOMY/DECOMPRESSION MICRODISCECTOMY;  Surgeon: DaEustace Moore [facility];  Service: Neurosurgery;  Laterality: Bilateral;  Bilateral  , Lumbar Three-Four, Lumbar Four-Five Decompressive Laminectomy Rm # 32   LUNG SURGERY     for empyema   SHOULDER ARTHROSCOPY Right 07/05/2013   Procedure: RIGHT SHOULDER ARTHROSCOPY WITH DEBRIDEMENT;  Surgeon: ChMcarthur RossettiMD;  [facility];  Laterality: Right;   TENDON TRANSFER Left 01/26/2014   Procedure: LEFT THUMB TRAPEZIECTOMY KNOTTED TENDON INTRAPOSITION TRANSFER OF ABDUCTOR POLLICIS LONGUS TO [place] Surgeon: RoTheodis Sato  Fabio Pierce, MD;  Location: [place];  Service: Orthopedics;  Laterality: Left;   THORACOTOMY  November 14 2010   rt       Home Medications    Prior to Admission medications   Medication Sig Start Date End Date Taking? Authorizing Provider  ALPRAZolam (XANAX) 1 MG tablet TAKE 1 TABLET BY MOUTH 3 TIMES A DAY AS NEEDED FOR SLEEP 07/29/22  Yes White, Brian A, NP  amphetamine-dextroamphetamine (ADDERALL XR) 30 MG 24 hr capsule Take 1 capsule (30 mg) by mouth every morning. 07/10/22  Yes White, Aaron Edelman A, NP  amphetamine-dextroamphetamine (ADDERALL XR) 30 MG 24 hr capsule Take 1 capsule (30 mg) by mouth every morning. 09/04/22  Yes White, Aaron Edelman A, NP   amphetamine-dextroamphetamine (ADDERALL XR) 30 MG 24 hr capsule Take 1 capsule (30 mg) by mouth every morning. 10/02/22  Yes White, Aaron Edelman A, NP  amphetamine-dextroamphetamine (ADDERALL) 20 MG tablet Take 1 tablet (20 mg) by mouth in the afternoon. 08/06/22  Yes White, Brian A, NP  amphetamine-dextroamphetamine (ADDERALL) 20 MG tablet Take 1 tablet (20 mg) by mouth in the afternoon. 09/04/22  Yes White, Aaron Edelman A, NP  amphetamine-dextroamphetamine (ADDERALL) 20 MG tablet Take 1 tablet (20 mg) by mouth in the afternoon. 10/02/22  Yes Lesle Chris A, NP  erythromycin ophthalmic ointment Place into both eyes in the morning and at bedtime for 5 days. Place a 1/2 inch ribbon of ointment into the lower eyelid. 09/04/22 09/09/22 Yes Jasmon Graffam, Myrene Galas, MD  lamoTRIgine (LAMICTAL) 200 MG tablet Take 1 tablet (200 mg total) by mouth in the morning. 04/01/22  Yes White, Aaron Edelman A, NP  silodosin (RAPAFLO) 8 MG CAPS capsule Take 8 mg by mouth daily. 12/08/21  Yes [provider]  sildenafil (VIAGRA) 50 MG tablet Take 50 mg by mouth daily as needed.    [provider]    Family History Family History  Problem Relation Age of Onset   COPD Mother    Heart disease Mother    Hypertension Mother    Coronary artery disease Father    Dementia Father    Heart failure Father    Schizophrenia Brother    Breast cancer Maternal Grandmother    Prostate cancer Paternal Grandfather        also had bone cancer   Cancer Paternal Grandmother        unsure what kind    Social History Social History   Tobacco Use   Smoking status: Never   Smokeless tobacco: Never  Vaping Use   Vaping Use: Never used  Substance Use Topics   Alcohol use: No    Comment: quit drinking in 1989   Drug use: Not Currently    Types: Hydrocodone    Comment: Prior heavy use of opioid pain medications up until 2 years ago     Allergies   Quinolones   Review of Systems Review of Systems   Physical Exam Triage Vital Signs ED  Triage Vitals  Enc Vitals Group     BP 09/04/22 1739 135/76     Pulse Rate 09/04/22 1739 78     Resp 09/04/22 1739 16     Temp 09/04/22 1739 98.6 F (37 C)     Temp Source 09/04/22 1739 Oral     SpO2 09/04/22 1739 95 %     Weight 09/04/22 1739 160 lb (72.6 kg)     Height 09/04/22 1739 '5\' 8"'$  (1.727 m)     Head Circumference --  Peak Flow --      Pain Score 09/04/22 1737 10     Pain Loc --      Pain Edu? --      Excl. in [place]? --    No data found.  Updated Vital Signs BP 135/76 (BP Location: Right Arm)   Pulse 78   Temp 98.6 F (37 C) (Oral)   Resp 16   Ht '5\' 8"'$  (1.727 m)   Wt 72.6 kg   SpO2 95%   BMI 24.33 kg/m   Visual Acuity Right Eye Distance:   Left Eye Distance:   Bilateral Distance:    Right Eye Near:   Left Eye Near:    Bilateral Near:     Physical Exam Vitals and nursing note reviewed.  Constitutional:      General: He is not in acute distress.    Appearance: He is not ill-appearing.  HENT:     Right Ear: Tympanic membrane normal.     Left Ear: Tympanic membrane normal.  Eyes:     Extraocular Movements: Extraocular movements intact.     Pupils: Pupils are equal, round, and reactive to light.     Comments: Internal hordeolum i in the right lower eyelid  Cardiovascular:     Rate and Rhythm: Normal rate and regular rhythm.  Neurological:     Mental Status: He is alert.      UC Treatments / Results  Labs (all labs ordered are listed, but only abnormal results are displayed) Labs Reviewed - No data to display  EKG   Radiology No results found.  Procedures Procedures (including critical care time)  Medications Ordered in UC Medications - No data to display  Initial Impression / Assessment and Plan / UC Course  I have reviewed the triage vital signs and the nursing notes.  Pertinent labs & imaging results that were available during my care of the patient were reviewed by me and considered in my medical decision making (see chart for  details).     1.  Blepharitis of the right lower eyelid: Erythromycin ointment twice daily for 5 days Warm compress over the lower eyelid bilaterally Return precautions given. Final Clinical Impressions(s) / UC Diagnoses   Final diagnoses:  Blepharitis of right lower eyelid, unspecified type     Discharge Instructions      Please apply medication twice daily Warm compress over the lower eyelids Return to the urgent care if you have worsening symptoms.   ED Prescriptions     Medication Sig Dispense Auth. Provider   erythromycin ophthalmic ointment Place into both eyes in the morning and at bedtime for 5 days. Place a 1/2 inch ribbon of ointment into the lower eyelid. 3.5 g Amandeep Nesmith, Myrene Galas, MD      PDMP not reviewed this encounter.   Chase Picket, MD 09/04/22 Vernelle Emerald

## 2022-09-05 ENCOUNTER — Ambulatory Visit: Payer: PPO | Admitting: Family Medicine

## 2022-09-19 ENCOUNTER — Telehealth: Payer: Self-pay

## 2022-09-19 ENCOUNTER — Other Ambulatory Visit: Payer: Self-pay | Admitting: Orthopaedic Surgery

## 2022-09-19 MED ORDER — OXYCODONE-ACETAMINOPHEN 5-325 MG PO TABS: 1.0000 | ORAL_TABLET | Freq: Four times a day (QID) | ORAL | 0 refills | Status: DC | PRN

## 2022-09-19 MED ORDER — PREDNISONE 5 MG (21) PO TBPK: ORAL_TABLET | ORAL | 0 refills | Status: DC

## 2022-09-19 NOTE — Telephone Encounter (Signed)
Noted. He explained prednisone would not help when I called and advised Dr. Lorin Mercy had sent that in. Message was sent back to Dr. Lorin Mercy to advise.

## 2022-09-19 NOTE — Telephone Encounter (Signed)
noted 

## 2022-09-19 NOTE — Telephone Encounter (Signed)
T called and states that he has had LBP x 4 days with NKI he states that he can "barely move" I made an appt for next Tuesday 09/23/22 to get something on the schedule. I did not see any opening for you today. The pt states that he is taking aleve and it is not helping. I advised that he has npt been in the office since 01/2022 and would need eval in office to see if medication was appropriate. [id number]

## 2022-09-19 NOTE — Telephone Encounter (Signed)
Pt called and states that he was given prednisone and he knows that this will not work. He said he knows his body and that this has been used in the past with out help. Pt states that he needs an MRI and that he wants Dr. Lorin Mercy to order this for him. I advised that we cannot do that without being seen. The pt was very upset about this but I explained that insurance requires that we eval the pt and do an xray and start with the most conservative treatment and send clinicals for approval. Pt states that he will just wait for his appt Tuesday but to please call if something comes available.

## 2022-09-19 NOTE — Telephone Encounter (Signed)
I called patient. He states that he is not going to get this as prednisone does nothing for him at all. He says that he has just gotten out of bed and he can hardly move. He knows about pain medications as he has had multiple surgeries and he has not had to take them in a while. He is going to have to have something strong. He would like to know if there is anything else that you can offer until he is seen on Tuesday.  Please advise.

## 2022-09-19 NOTE — Telephone Encounter (Signed)
Please advise. Appointment has been made for next Tuesday. Is there anything that you recommend patient do until he can be seen in the office?

## 2022-09-23 ENCOUNTER — Ambulatory Visit (INDEPENDENT_AMBULATORY_CARE_PROVIDER_SITE_OTHER): Payer: PPO | Admitting: Orthopaedic Surgery

## 2022-09-23 VITALS — BP 129/77 | HR 70 | Ht 68.0 in | Wt 160.0 lb

## 2022-09-23 DIAGNOSIS — M48061 Spinal stenosis, lumbar region without neurogenic claudication: Secondary | ICD-10-CM | POA: Diagnosis not present

## 2022-09-23 DIAGNOSIS — Z9889 Other specified postprocedural states: Secondary | ICD-10-CM | POA: Diagnosis not present

## 2022-09-23 DIAGNOSIS — M5126 Other intervertebral disc displacement, lumbar region: Secondary | ICD-10-CM | POA: Insufficient documentation

## 2022-09-23 NOTE — Addendum Note (Signed)
Addended by: Meyer Cory on: 09/23/2022 09:08 AM   Modules accepted: Orders

## 2022-09-23 NOTE — Progress Notes (Signed)
Office Visit Note   Patient: Paul Ray           Date of Birth: October 18, 1955           MRN: [id number] Visit Date: 09/23/2022              Requested by: Maximiano Coss, NP [place],  [place] [id number] PCP: Maximiano Coss, NP   Assessment & Plan: Visit Diagnoses:  1. Spinal stenosis of lumbar region without neurogenic claudication   2. History of lumbar laminectomy for spinal cord decompression   3. Protrusion of lumbar intervertebral disc     Plan: Patient request single epidural injection with Dr. Ernestina Patches.  If he has persistent problems we will need to consider reimaging studies.  He can follow-up with me if he has ongoing problems post epidural.  Follow-Up Instructions: No follow-ups on file.   Orders:  No orders of the defined types were placed in this encounter.  No orders of the defined types were placed in this encounter.     Procedures: No procedures performed   Clinical Data: No additional findings.   Subjective: Chief Complaint  Patient presents with   Lower Back - Pain    HPI 67 year old male returns with ongoing problems with back pain.  Previous epidural 02/03/2022 single injection gave him good relief for several months.  He states last week his pain he rated at almost 10 out of 10 and can barely stand and walk.  I talked to him on the phone for considerable length of time I did send in some Percocet.  Patient is very concerned about Tylenol.  Remote history of being on pain medication.  He is use anti-inflammatories ibuprofen and Aleve without relief.  Previous multilevel decompression surgery done by Dr. Sherley Bounds 2012.  Decompression L3-4 L4-5.  Microdiscectomy at L3-4.  MRI scan 14 months ago showed adjacent level severe stenosis at L2-3.  Patient is requesting repeat injection by Dr. Ernestina Patches since the last 1 gave him great relief.  Review of Systems updated unchanged.   Objective: Vital Signs: BP 129/77   Pulse 70   Ht '5\' 8"'$  (1.727 m)    Wt 160 lb (72.6 kg)   BMI 24.33 kg/m   Physical Exam Constitutional:      Appearance: He is well-developed.  HENT:     Head: Normocephalic and atraumatic.     Right Ear: External ear normal.     Left Ear: External ear normal.  Eyes:     Pupils: Pupils are equal, round, and reactive to light.  Neck:     Thyroid: No thyromegaly.     Trachea: No tracheal deviation.  Cardiovascular:     Rate and Rhythm: Normal rate.  Pulmonary:     Effort: Pulmonary effort is normal.     Breath sounds: No wheezing.  Abdominal:     General: Bowel sounds are normal.     Palpations: Abdomen is soft.  Musculoskeletal:     Cervical back: Neck supple.  Skin:    General: Skin is warm and dry.     Capillary Refill: Capillary refill takes less than 2 seconds.  Neurological:     Mental Status: He is alert and oriented to person, place, and time.  Psychiatric:        Behavior: Behavior normal.        Thought Content: Thought content normal.        Judgment: Judgment normal.     Ortho Exam patient  can stand almost upright.  Slow getting from sitting standing he can ambulate normal heel-toe gait.  Some pain with straight leg raising well-healed lumbar incision.  Specialty Comments:  No specialty comments available.  Imaging: No results found.   PMFS History: Patient Active Problem List   Diagnosis Date Noted   Protrusion of lumbar intervertebral disc 09/23/2022   History of lumbar laminectomy for spinal cord decompression 01/08/2022   Spinal stenosis of lumbar region 07/25/2021   Foraminal stenosis of cervical region 03/08/2021   Viral pericarditis    Alcohol use disorder, severe, dependence ([place]) 12/06/2020   Chest pain 11/09/2020   Ascending aortic aneurysm (HCC)    Benign essential HTN    Coronary artery calcification seen on CAT scan    Psychoactive substance-induced psychosis ([place])    Rib pain 07/01/2017   Closed fracture of one rib of left side 07/01/2017   Chronic right shoulder  pain 05/04/2017   Osteoarthritis of right hip 12/02/2016   Lumbar radiculopathy 04/17/2014   Chronic low back pain 08/16/2013   Lumbar post-laminectomy syndrome 08/16/2013   Lumbosacral radiculitis 08/16/2013   Impingement syndrome of right shoulder 07/05/2013   Empyema lung (HCC)    Abscess of lung(513.0)    Bipolar depression ([place])    ADD (attention deficit disorder)    Hepatitis C    Hip pain 05/02/2011   History of total hip arthroplasty 04/23/2011   Lung mass 10/22/2010   Pleural effusion 10/15/2010   Past Medical History:  Diagnosis Date   Abscess of lung(513.0)    Right lower lobe   ADD (attention deficit disorder)    Anxiety    Arthritis    Ascending aortic aneurysm (HCC)    19m by Chest CTA 05/2020 and 434mby echo 05/2020   Benign essential HTN    Bipolar depression ([facility] [place]   Coronary artery calcification seen on CAT scan    Empyema lung ([facility]   Hemorrhoids    Hepatitis C    Pneumonia    last year   Prostate abscess    Viral pericarditis    after COVID 19 infection    Family History  Problem Relation Age of Onset   COPD Mother    Heart disease Mother    Hypertension Mother    Coronary artery disease Father    Dementia Father    Heart failure Father    Schizophrenia Brother    Breast cancer Maternal Grandmother    Prostate cancer Paternal Grandfather        also had bone cancer   Cancer Paternal Grandmother        unsure what kind    Past Surgical History:  Procedure Laterality Date   ELBOW SURGERY  2005   Right   FOOT NEUROMA SURGERY  2006   Left   [facility]     [facility]   LUMBAR LAMINECTOMY/DECOMPRESSION MICRODISCECTOMY  07/24/2011   Procedure: LUMBAR LAMINECTOMY/DECOMPRESSION MICRODISCECTOMY;  Surgeon: DaEustace Moore [facility];  Service: Neurosurgery;  Laterality: Bilateral;  Bilateral  , Lumbar Three-Four, Lumbar Four-Five Decompressive Laminectomy Rm # 32   LUNG SURGERY     for empyema   SHOULDER  ARTHROSCOPY Right 07/05/2013   Procedure: RIGHT SHOULDER ARTHROSCOPY WITH DEBRIDEMENT;  Surgeon: ChMcarthur RossettiMD;  [facility]: [facility];  Laterality: Right;   TENDON TRANSFER Left 01/26/2014   Procedure: LEFT THUMB TRAPEZIECTOMY KNOTTED TENDON INTRAPOSITION TRANSFER OF ABDUCTOR POLLICIS LONGUS TO  [facility];  Surgeon: Cammie Sickle, MD;  Location: [place];  Service: Orthopedics;  Laterality: Left;   THORACOTOMY  November 14 2010   rt   Social History   Occupational History   Occupation: Games developer  Tobacco Use   Smoking status: Never   Smokeless tobacco: Never  Vaping Use   Vaping Use: Never used  Substance and Sexual Activity   Alcohol use: No    Comment: quit drinking in 1989   Drug use: Not Currently    Types: Hydrocodone    Comment: Prior heavy use of opioid pain medications up until 2 years ago   Sexual activity: Yes

## 2022-09-24 ENCOUNTER — Other Ambulatory Visit: Payer: Self-pay | Admitting: Behavioral Health

## 2022-09-24 DIAGNOSIS — G479 Sleep disorder, unspecified: Secondary | ICD-10-CM

## 2022-09-24 NOTE — Telephone Encounter (Signed)
Requesting refill on Xanax 1 mg sent to  at [facility] at [address], [place], [place].

## 2022-09-25 ENCOUNTER — Other Ambulatory Visit: Payer: Self-pay | Admitting: Behavioral Health

## 2022-09-25 DIAGNOSIS — F331 Major depressive disorder, recurrent, moderate: Secondary | ICD-10-CM

## 2022-09-25 DIAGNOSIS — F3160 Bipolar disorder, current episode mixed, unspecified: Secondary | ICD-10-CM

## 2022-09-25 DIAGNOSIS — F411 Generalized anxiety disorder: Secondary | ICD-10-CM

## 2022-09-25 NOTE — Telephone Encounter (Signed)
Pended.

## 2022-09-25 NOTE — Telephone Encounter (Signed)
Benaiah called to check status of this prescription of Xanax.  He is adamant that it was due yesterday no later than today and it needs to get sent today!  Apt 8/5.  He also wants refills on it so he doesn't have to call every month.  He said Aaron Edelman told him he will send in prescription a couple of days to be due to that the prescription is ready when time to fill.

## 2022-09-29 ENCOUNTER — Telehealth: Payer: Self-pay

## 2022-09-29 ENCOUNTER — Telehealth: Payer: Self-pay | Admitting: Behavioral Health

## 2022-09-29 ENCOUNTER — Telehealth: Payer: Self-pay | Admitting: Radiology

## 2022-09-29 ENCOUNTER — Telehealth: Payer: Self-pay | Admitting: Physical Medicine and Rehabilitation

## 2022-09-29 DIAGNOSIS — Z9889 Other specified postprocedural states: Secondary | ICD-10-CM

## 2022-09-29 DIAGNOSIS — M48061 Spinal stenosis, lumbar region without neurogenic claudication: Secondary | ICD-10-CM

## 2022-09-29 NOTE — Telephone Encounter (Signed)
MRI order entered.

## 2022-09-29 NOTE — Telephone Encounter (Signed)
Pt called requesting a call [phone].ll back from [place]. Pt phone number is

## 2022-09-29 NOTE — Telephone Encounter (Signed)
Pt is aware of the issue and will come pick up samples

## 2022-09-29 NOTE — Telephone Encounter (Signed)
Paul Ray called stating that he would like to get a MRI set up. He stated that Dr. Lorin Mercy mentioned it and he would like to go ahead and follow through

## 2022-09-29 NOTE — Telephone Encounter (Signed)
Patient wants to know if he can get a sooner appointment than 10/02/22. Informed patient that there are no openings at the moments and if anyone cancels, I will give him a call

## 2022-09-29 NOTE — Telephone Encounter (Signed)
Pt called out of Vraylar 3 days. Cost is over $400. Pt contacted insurance. Will update when advised by insurance. If can't afford will need to stop or replace.Started out free, then copay $5 now $450.

## 2022-09-29 NOTE — Addendum Note (Signed)
Addended by: Meyer Cory on: 09/29/2022 10:47 AM   Modules accepted: Orders

## 2022-09-29 NOTE — Telephone Encounter (Signed)
I left voicemail for patient advising- 1.Marland Kitchen MRI has been ordered. We will work on Print production planner authorization and someone will call him to schedule. 2.. Gari Crown will let him know if someone cancels and an opening is available prior to his Thursday appointment for injection. 3.. Dr. Lorin Mercy is unable to send in anything different in regards to pain medication.

## 2022-09-29 NOTE — Telephone Encounter (Signed)
Noted. I have spoken with patient in detail this morning. Message was sent to Dr. Lorin Mercy. MRI order entered.

## 2022-09-29 NOTE — Telephone Encounter (Signed)
Patient called extremely upset. He states that his back pain is the worst it has ever been and he had asked Brittney, with Dr. Romona Curls area, if she could bring him in sooner for injection. She does not have any sooner availability for him as he is scheduled for injection this Thursday. Patient asked to be transferred to me. He states that he finally picked up the Percocet, even though he does not want to take Tylenol, and it has done nothing for his pain. He is also taking Aleve. He is upset that he cannot get something stronger as he is getting no relief and does not feel that he should have to suffer. He states that it takes stronger pain medication to help him, such as hydromorphone or morphine.   He would like for you to go ahead and place order for MRI as he does not want to wait. He is also requesting phone call back from Dr. Lorin Mercy. I did explain to patient that Dr. Lorin Mercy is in surgery today and that I would send the message.   Please advise. OK for MRI order? Could you call patient?

## 2022-09-29 NOTE — Telephone Encounter (Signed)
Will need to give him samples until co-pay cards are working properly

## 2022-09-30 ENCOUNTER — Other Ambulatory Visit: Payer: Self-pay | Admitting: Orthopaedic Surgery

## 2022-09-30 ENCOUNTER — Telehealth: Payer: Self-pay | Admitting: Orthopaedic Surgery

## 2022-09-30 NOTE — Telephone Encounter (Signed)
Patient states he is in very back pain. He would some kind of pain meds [id number]

## 2022-09-30 NOTE — Telephone Encounter (Signed)
Please advise. Patient is scheduled for ESI on Thursday.

## 2022-09-30 NOTE — Telephone Encounter (Signed)
noted 

## 2022-10-01 ENCOUNTER — Telehealth: Payer: Self-pay | Admitting: Physical Medicine and Rehabilitation

## 2022-10-01 ENCOUNTER — Ambulatory Visit
Admission: RE | Admit: 2022-10-01 | Discharge: 2022-10-01 | Disposition: A | Discharge status: Home / Self Care | Payer: PPO | Source: Ambulatory Visit | Attending: Orthopaedic Surgery | Admitting: Orthopaedic Surgery

## 2022-10-01 DIAGNOSIS — M48061 Spinal stenosis, lumbar region without neurogenic claudication: Secondary | ICD-10-CM

## 2022-10-01 DIAGNOSIS — Z9889 Other specified postprocedural states: Secondary | ICD-10-CM

## 2022-10-01 NOTE — Telephone Encounter (Signed)
Pt called stating he received a call from Dr. Lorin Mercy called and reviews his his MRI and that the injection is not going to help that pt is scheduled for 3/7 with Dr. Ernestina Patches. Pt asked for [place] J to call and confirm he is cancelling appt. Please call pt at [phone].

## 2022-10-01 NOTE — Telephone Encounter (Signed)
Patient wanting if he could be seen today? He has an appointment tomorrow.Paul Ray

## 2022-10-01 NOTE — Telephone Encounter (Signed)
Informed patient that there are no appointments available today and we would have to keep appointment for tomorrow

## 2022-10-01 NOTE — Telephone Encounter (Signed)
Spoke with patient and confirmed appointment was cancelled

## 2022-10-02 ENCOUNTER — Telehealth: Payer: Self-pay

## 2022-10-02 ENCOUNTER — Encounter: Payer: PPO | Admitting: Physical Medicine and Rehabilitation

## 2022-10-02 ENCOUNTER — Telehealth: Payer: Self-pay | Admitting: Orthopaedic Surgery

## 2022-10-02 ENCOUNTER — Telehealth: Payer: Self-pay | Admitting: Radiology

## 2022-10-02 ENCOUNTER — Other Ambulatory Visit: Payer: Self-pay | Admitting: Orthopaedic Surgery

## 2022-10-02 MED ORDER — OXYCODONE-ACETAMINOPHEN 5-325 MG PO TABS: 1.0000 | ORAL_TABLET | Freq: Four times a day (QID) | ORAL | 0 refills | Status: DC | PRN

## 2022-10-02 MED ORDER — OXYCODONE HCL 5 MG PO CAPS: 5.0000 mg | ORAL_CAPSULE | Freq: Four times a day (QID) | ORAL | 0 refills | Status: DC | PRN

## 2022-10-02 NOTE — Telephone Encounter (Signed)
Patient called and states that the Oxycodone '5mg'$  capsule was sent in instead of the tablet. The capsule is more expensive and he would like for this to be changed. He also would like for it to be sent to Kristopher Oppenheim at [facility] instead as it is cheaper for him.  Please advise.

## 2022-10-02 NOTE — Telephone Encounter (Signed)
Patient called stating that he is still hurting and now is having numbness in his right leg.  Would like a call back from Dr. Lorin Mercy.  Cb# [phone].  Please advise.  Thank you.

## 2022-10-02 NOTE — Telephone Encounter (Signed)
Please advise 

## 2022-10-02 NOTE — Telephone Encounter (Signed)
noted 

## 2022-10-02 NOTE — Telephone Encounter (Signed)
Noted  

## 2022-10-02 NOTE — Telephone Encounter (Signed)
I spoke with patient, and advised him Dr. Laurance Flatten does not have an available appointment tomorrow, 10/03/22. Patient was scheduled for Monday 10/06/22 with Dr. Laurance Flatten.

## 2022-10-02 NOTE — Telephone Encounter (Signed)
I called, rx cancelled.

## 2022-10-02 NOTE — Telephone Encounter (Signed)
Patient called and advising that he is in severe pain and he wanted someone to send him medication. Sent call to Triage.

## 2022-10-06 ENCOUNTER — Ambulatory Visit (INDEPENDENT_AMBULATORY_CARE_PROVIDER_SITE_OTHER): Payer: PPO

## 2022-10-06 ENCOUNTER — Encounter: Payer: Self-pay | Admitting: Orthopedic Surgery

## 2022-10-06 ENCOUNTER — Ambulatory Visit (INDEPENDENT_AMBULATORY_CARE_PROVIDER_SITE_OTHER): Payer: PPO | Admitting: Orthopedic Surgery

## 2022-10-06 VITALS — BP 138/81 | HR 74 | Ht 68.0 in | Wt 160.0 lb

## 2022-10-06 DIAGNOSIS — M48061 Spinal stenosis, lumbar region without neurogenic claudication: Secondary | ICD-10-CM | POA: Diagnosis not present

## 2022-10-06 DIAGNOSIS — Z9889 Other specified postprocedural states: Secondary | ICD-10-CM

## 2022-10-06 NOTE — Progress Notes (Signed)
Orthopedic Spine Surgery Office Note  Assessment: Patient is a 67 y.o. male with right-sided leg pain consistent with radiculopathy.  He has a L1/2 disc herniation, L1/2 central stenosis, L2/3 central lateral recess stenosis   Plan: -Explained that initially conservative treatment is tried as a significant number of patients may experience relief with these treatment modalities. Discussed that the conservative treatments include:  -activity modification  -physical therapy  -over the counter pain medications  -medrol dosepak  -lumbar steroid injections -Patient has tried Tylenol, NSAIDs, Percocet -Since he has not been getting better with conservative treatment, his pain is severe and requiring opioids to control it, and he is not able to do any activities except to bathe himself because of the pain, recommended operative management in the form of laminectomies and discectomy -Patient will next be seen at date of surgery.  Will coordinate with Dr. Lorin Mercy about timing  The patient has symptoms consistent with lumbar radiculopathy. The patient's symptoms are severe in nature and are not getting improvement with conservative treatment so operative management was discussed in the form of L1/2 laminectomy, L1/2 discectomy, and L2/3 laminectomy the risks including but not limited to iatrogenic instability, dural tear, nerve root injury, paralysis, persistent pain, infection, bleeding, heart attack, death, stroke, fracture, blindness, recurrent disc herniation and need for additional procedures were discussed with the patient. The benefit of the surgery would be improvement in the patient's symptoms related to the lumbar stenosis and disc herniation which would be the leg pain. I explained that back pain relief is not the goal of the surgery and it is not reliably alleviated with this surgery. The alternatives to surgical management were covered with the patient and included continued monitoring, physical  therapy, over-the-counter pain medications, ambulatory aids, and activity modification. All the patient's questions were answered to his satisfaction. After this discussion, the patient expressed understanding and elected to proceed with surgical intervention.     ___________________________________________________________________________   History:  Patient is a 67 y.o. male who presents today for lumbar spine.  Patient has had 6 weeks of low back pain that radiates into the right hip and right anterior thigh.  There is no trauma or injury that brought on this pain.  He gets pain all the way down to his knee on the right side.  He is not having left-sided symptoms.  Pain is worse with activity and does improve if he sits down.  It does not completely go away if he sits down.  Pain is rated as severe in nature.  He has not had pain like this before.  He also notes numbness and tingling in the leg in the same distribution as the pain.  No numbness and tingling on the left side.   Weakness: Denies Symptoms of imbalance: Denies Paresthesias and numbness: Yes, into right hip and right anterior thigh to the level of the knee Bowel or bladder incontinence: Denies Saddle anesthesia: Denies  Treatments tried: Tylenol, NSAIDs, Percocet  Review of systems: Denies fevers and chills, night sweats, unexplained weight loss, history of cancer.  Has had pain that wakes him at night  Past medical history: Anxiety Osteoarthritis Ascending aortic aneurysm Hypertension Hemorrhoids Bipolar Hepatitis C  Allergies: Quinolones  Past surgical history:  Right elbow surgery Left Morton's neuroma excision L3-5 laminectomies Left trapeziectomy with tendon interposition Bilateral hip resurfacing Lung surgery Right shoulder arthroscopy Thoracotomy  Social history: Denies use of nicotine product (smoking, vaping, patches, smokeless) Alcohol use: Denies Denies recreational drug use   Physical  Exam:  General: no acute distress, appears stated age Neurologic: alert, answering questions appropriately, following commands Respiratory: unlabored breathing on room air, symmetric chest rise Psychiatric: appropriate affect, normal cadence to speech   MSK (spine):  -Strength exam      Left  Right EHL    4/5  4/5 TA    5/5  5/5 GSC    5/5  5/5 Knee extension  5/5  5/5 Hip flexion   5/5  5/5  -Sensory exam    Sensation intact to light touch in L3-S1 nerve distributions of bilateral lower extremities  -Achilles DTR: 2/4 on the left, 2/4 on the right -Patellar tendon DTR: 2/4 on the left, 2/4 on the right  -Straight leg raise: Negative -Contralateral straight leg raise: Negative -Femoral nerve stretch test: Negative bilaterally -Clonus: no beats bilaterally  -Left hip exam: No pain through range of motion, negative Stinchfield, negative FABER -Right hip exam: No pain through range of motion, negative Stinchfield, negative FABER  Imaging: XR of the lumbar spine from 10/06/2022 was independently reviewed and interpreted, showing bilateral hip resurfacing's with the appearance of HO in the area of the left hip. Grade 1 spondylolisthesis at L4/5.  No evidence of instability on flexion/extension views.  No fracture or dislocation.  MRI of the lumbar spine from 10/01/2022 was independently reviewed and interpreted, showing L1/2 disc herniation with caudal migration on the right side.  There is central stenosis at that level as well.  There is central and lateral recess stenosis at L2/3.  Mild foraminal stenosis bilaterally at L1/2 and L2/3.  Prior hemilaminotomy at L3/4 and L4/5.   Patient name: Paul Ray Patient MRN: [id number] Date of visit: 10/06/22

## 2022-10-07 ENCOUNTER — Encounter: Payer: Self-pay | Admitting: Family Medicine

## 2022-10-07 ENCOUNTER — Ambulatory Visit (INDEPENDENT_AMBULATORY_CARE_PROVIDER_SITE_OTHER): Payer: PPO | Admitting: Family Medicine

## 2022-10-07 VITALS — BP 132/72 | HR 77 | Temp 97.8°F | Ht 68.0 in | Wt 160.1 lb

## 2022-10-07 DIAGNOSIS — S61212A Laceration without foreign body of right middle finger without damage to nail, initial encounter: Secondary | ICD-10-CM

## 2022-10-07 DIAGNOSIS — I7121 Aneurysm of the ascending aorta, without rupture: Secondary | ICD-10-CM | POA: Diagnosis not present

## 2022-10-07 DIAGNOSIS — Z7689 Persons encountering health services in other specified circumstances: Secondary | ICD-10-CM | POA: Diagnosis not present

## 2022-10-07 NOTE — Patient Instructions (Addendum)
It was a pleasure to meet you today and I look forward to taking care of you. Keep right middle finger cleaned, dry, and my place a band aid over the area. If it becomes red or drainage, follow up. Placed a referral to be seen by another provider with [facility] for history of aneurysm of ascending aorta. Follow up for a physical before surgery if at all possible. Fasting at physical appointment. Only drink water and black coffee 6 hours before appointment.

## 2022-10-07 NOTE — Progress Notes (Signed)
New Patient Office Visit  Subjective    Patient ID: Paul Ray, male    DOB: 12-Oct-1955  Age: 67 y.o. MRN: [id number]  CC:  Chief Complaint  Patient presents with   Establish Care    Pt is here to [facility]. Pt states he has a spot on his ring finger. This is just annoying. Pt is having surgery on lower back in 13 days. Pt is not Fasting.    HPI Paul Ray presents to establish care with new provider.  Patient has a sore on his right middle finger that has been annoying for a while. He reports he has not picked at the sore since he was concerned that it would become infected.   Patient has several specialist.  [facility]-Dr. B. Ray  Racine Paul Deaner, NP Previously seen [facility] for colonoscopy; [facility]-Dr. Wilmer Ray, last appointment 09/06/2020 with follow up as needed; and [facility], Dr. Fransico Ray, last appointment was 12/20/2020 with a 1 year follow up. He has cancelled several appointments.   He was seen by Dr. Morton Ray yesterday with [facility]. He is being scheduled for surgery in the form of laminectomies and discectomy based on ortho visit note.  Outpatient Encounter Medications as of 10/07/2022  Medication Sig   ALPRAZolam (XANAX) 1 MG tablet TAKE 1 TABLET BY MOUTH 3 TIMES A DAY AS NEEDED FOR SLEEP   amphetamine-dextroamphetamine (ADDERALL XR) 30 MG 24 hr capsule Take 1 capsule (30 mg) by mouth every morning.   amphetamine-dextroamphetamine (ADDERALL) 20 MG tablet Take 1 tablet (20 mg) by mouth in the afternoon.   lamoTRIgine (LAMICTAL) 200 MG tablet Take 1 tablet (200 mg total) by mouth in the morning.   oxycodone (OXY-IR) 5 MG capsule Take 1 capsule (5 mg total) by mouth every 6 (six) hours as needed.   silodosin (RAPAFLO) 8 MG CAPS capsule Take 8 mg by mouth daily.   [DISCONTINUED] oxyCODONE-acetaminophen (PERCOCET/ROXICET) 5-325 MG tablet Take 1 tablet by mouth every 6 (six)  hours as needed for severe pain.   [DISCONTINUED] amphetamine-dextroamphetamine (ADDERALL XR) 30 MG 24 hr capsule Take 1 capsule (30 mg) by mouth every morning. (Patient not taking: Reported on 10/07/2022)   [DISCONTINUED] amphetamine-dextroamphetamine (ADDERALL XR) 30 MG 24 hr capsule Take 1 capsule (30 mg) by mouth every morning.   [DISCONTINUED] amphetamine-dextroamphetamine (ADDERALL) 20 MG tablet Take 1 tablet (20 mg) by mouth in the afternoon. (Patient not taking: Reported on 10/07/2022)   [DISCONTINUED] amphetamine-dextroamphetamine (ADDERALL) 20 MG tablet Take 1 tablet (20 mg) by mouth in the afternoon. (Patient not taking: Reported on 10/07/2022)   [DISCONTINUED] oxyCODONE-acetaminophen (PERCOCET/ROXICET) 5-325 MG tablet Take 1 tablet by mouth every 6 (six) hours as needed for severe pain. (Patient not taking: Reported on 09/23/2022)   [DISCONTINUED] predniSONE (STERAPRED UNI-PAK 21 TAB) 5 MG (21) TBPK tablet Take 6,5,4,3,2,1 one tablet less each day with food. (Patient not taking: Reported on 09/23/2022)   [DISCONTINUED] sildenafil (VIAGRA) 50 MG tablet Take 50 mg by mouth daily as needed. (Patient not taking: Reported on 10/07/2022)   [DISCONTINUED] VRAYLAR 1.5 MG capsule TAKE 1 CAPSULE BY MOUTH DAILY. (Patient not taking: Reported on 10/07/2022)   No facility-administered encounter medications on file as of 10/07/2022.    Past Medical History:  Diagnosis Date   Abscess of lung(513.0)    Right lower lobe   ADD (attention deficit disorder)    Anxiety    Arthritis    Ascending aortic  aneurysm ([place])    24m by Chest CTA 05/2020 and 420mby echo 05/2020   Benign essential HTN    Bipolar depression ([facility]   Coronary artery calcification seen on CAT scan    [place]ema lung ([place]   Hemorrhoids    Hepatitis C    Pneumonia    last year   Prostate abscess    Viral pericarditis    after COVID 19 infection    Past Surgical History:  Procedure Laterality Date   ELBOW SURGERY  2005   Right    FOOT NEUROMA SURGERY  2006   Left   H[facility]ING     [facility]   LUMBAR LAMINECTOMY/DECOMPRESSION MICRODISCECTOMY  07/24/2011   Procedure: LUMBAR LAMINECTOMY/DECOMPRESSION MICRODISCECTOMY;  Surgeon: DaEustace Moore [facility];  Service: Neurosurgery;  Laterality: Bilateral;  Bilateral  , Lumbar Three-Four, Lumbar Four-Five Decompressive Laminectomy Rm # [id number]   LUNG SURGERY     for empyema   SHOULDER ARTHROSCOPY Right 07/05/2013   Procedure: RIGHT SHOULDER ARTHROSCOPY WITH DEBRIDEMENT;  Surgeon: ChMcarthur RossettiMD;  [place] [facility];  Laterality: Right;   TENDON TRANSFER Left 01/26/2014   Procedure: LEFT THUMB TRAPEZIECTOMY KNOTTED TENDON INTRAPOSITION TRANSFER OF ABDUCTOR POLLICIS LONGUS TO [place] Surgeon: RoCammie SickleMD;  [facility] Service: [facility];  Laterality: Left;   THORACOTOMY  November 14 2010   rt    Family History  Problem Relation Age of Onset   COPD Mother    Heart disease Mother    Hypertension Mother    Coronary artery disease Father    Dementia Father    Heart failure Father    Schizophrenia Brother    Breast cancer Maternal Grandmother    Prostate cancer Paternal Grandfather        also had bone cancer   Cancer Paternal Grandmother        unsure what kind    Social History   Socioeconomic History   Marital status: Single    Spouse name: Not on file   Number of children: 2   Years of education: 12   Highest education level: High school graduate  Occupational History   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    Social Determinants of Health   Financial Resource Strain: Not on file  Food Insecurity: Not on file  Transportation Needs: Not on file  Physical Activity: Not on file  Stress: Not on file  Social Connections: Not on file  Intimate Partner Violence: Not on file    ROS See HPI above    Objective    BP 132/72   Pulse 77   Temp 97.8 F (36.6 C)   Ht '5\' 8"'$  (1.727 m)   Wt 160 lb 2 oz (72.6 kg)   SpO2 96%   BMI 24.35 kg/m   Physical Exam Vitals reviewed.  Constitutional:      General: He is not in acute distress.    Appearance: Normal appearance. He is normal weight. He is not ill-appearing or toxic-appearing.  Eyes:     General:        Right eye: No discharge.        Left eye: No discharge.     Conjunctiva/sclera: Conjunctivae normal.  Cardiovascular:     Rate and Rhythm: Normal rate and regular rhythm.     Heart sounds: Normal heart sounds. No murmur heard.    No friction rub. No gallop.  Pulmonary:     Effort: Pulmonary effort is normal. No respiratory distress.     Breath sounds: Normal breath sounds.  Musculoskeletal:        General: Normal range of motion.  Skin:    General: Skin is warm and dry.     Comments: A raised white scab on the right posterior middle finger. No erythema, drainage, or open wound.   Neurological:     General: No focal deficit present.     Mental Status: He is alert and oriented to person, place, and time. Mental status is at baseline.  Psychiatric:        Mood and Affect: Mood normal.        Behavior: Behavior normal.        Thought Content: Thought content normal.        Judgment: Judgment normal.      Assessment & Plan:  Aneurysm of ascending aorta without rupture ([place]) -     Ambulatory referral to Cardiology  Laceration of middle finger of right hand without complication, initial encounter  Establishing care with new doctor, encounter  for   1.Review health maintenance: request medical records [facility] for previous colonscopy, pt believes it was around 5 years ago; declines PNA, influenza, RSV, and shingles vaccine.  2.Needs AWV, however, patient declines.  3.Referral back to [facility], besides Dr. Fransico Ray, for a follow up for his history of Ascending Aortic Aneurysm since it has been 12/20/2020.  4. Using clean technique, removed single raised white scab from right middle finger with a forcep out of the suture removal kit. No bleeding occurred. Patient tolerated procedure well. Recommend to keep finger cleaned, dry, and can place a band aid over the area. No open areas on skin. Advised to follow up if area becomes red or drainage noted.  5.Follow up for a physical before surgery if at possible. Be fasting. Will draw labs then since he is not fasting.  Return for Physcial .   Valarie Merino, NP

## 2022-10-08 ENCOUNTER — Telehealth: Payer: Self-pay | Admitting: Cardiology

## 2022-10-08 ENCOUNTER — Telehealth: Payer: Self-pay

## 2022-10-08 MED ORDER — OXYCODONE HCL 5 MG PO CAPS: 5.0000 mg | ORAL_CAPSULE | ORAL | 0 refills | Status: DC | PRN

## 2022-10-08 NOTE — Telephone Encounter (Signed)
Triage call: the patient states he is having the worst pain that he has ever had in his back - constant, stabbing pain in the lower back and around to the right hip. His pain med, Oxy 5 mg, is not touching this pain. Heat does help some, though. He said he knows the plan is to have surgery on 3/25, but he wants to know what to do in the meantime for this pain. His Oxy will run out tomorrow - requesting more of this, or something stronger.  CB [phone]

## 2022-10-08 NOTE — Telephone Encounter (Signed)
Pt called stating they would like to switch from Dr. Radford Pax to Dr. Ali Lowe. Would both of you be okay with the pt switching from Dr. Radford Pax to Dr. Ali Lowe?

## 2022-10-08 NOTE — Addendum Note (Signed)
Addended by: Ileene Rubens on: 10/08/2022 09:20 AM   Modules accepted: Orders

## 2022-10-09 ENCOUNTER — Telehealth: Payer: Self-pay | Admitting: Orthopedic Surgery

## 2022-10-09 NOTE — Telephone Encounter (Signed)
I called and spoke with patient, I adivsed that his meds were sent [facility] to [facility]-- He states that it was supposed to go to [facility].  I advsied that I did change the pharmacy, however that when Dr. Laurance Flatten renewed the previous rx that it was originally sent to [facility] which is why it went there.  He is going to call [facility] and check on the price of this medicine there and he states that he will call me back to let me know if I need to move it to [facility]

## 2022-10-09 NOTE — Telephone Encounter (Signed)
Orthopedic Telephone Call  Spoke to patient this evening.  He had further questions about the surgery.  We talked about the risks of surgery.  I explained that the risk of recurrent disc herniation is somewhere between 5 and 10%.  I told him that he has a risk of infection and that this around 1 to 2%.  I discussed the fact that there is risks of durotomy, instability, nerve root injury, anesthesia, along with several others.  I told him that surgery should be the last option and that the risks of conservative treatments are lower in terms of percentage.  I discussed that there are some patients who get a complication from surgery and then wish they never had surgery in the first place.  We talked about injection and I told him that I like to use those 1) for pain relief as hopefully a disc herniation will reabsorb on its own and 2) for diagnostic purposes.  I told him that can help him and myself have an informed discussion about what can be achieved with surgical decompression.  I told him that his chronic back pain for the last several years is unlikely to get better with the proposed surgery.  I told him that the surgery is more for the radiating hip and numbness and tingling going into the leg.  It is not predictable at relieving back pain.  Since his back pain proceeded this disc herniation and I have an MRI from 2022 that shows no disc herniation at L1-2 when he was still having back pain, I do not think that removing this disc will alleviate his chronic back pain.  He states that he is still leaning towards surgery but wants to think about things.  He was asking why he needs to see his PCP and cardiologist to get insurance approval.  I explained that the PCP and cardiologist are to make sure that he is optimized from a medical standpoint before surgery.  I told him the insurance approval is a separate process.  All his questions were answered to his satisfaction.  I told him that this is elective surgery  and he is ultimately in control of the plan and what treatment option he wants to choose. He will let me know if he wants to change the plan.   Callie Fielding, MD Orthopedic Surgeon

## 2022-10-09 NOTE — Telephone Encounter (Signed)
Patient states he need his pain medicine called in and he is upset that no one has called him back. He states he called yesterday.. please advise.

## 2022-10-09 NOTE — Progress Notes (Signed)
Cardiology Office Note:    Date:  10/13/2022   ID:  Paul Ray, DOB 12/27/1955, MRN [id number]  PCP:  Evangeline Gula, NP   [facility] Providers Cardiologist:  Lenna Sciara, MD Referring MD: Evangeline Gula, NP   Chief Complaint/Reason for Referral: Cardiology follow-up  ASSESSMENT:    1. Aneurysm of ascending aorta without rupture ([place])   2. Coronary artery calcification seen on CAT scan   3. Hyperlipidemia LDL goal <70   4. Preoperative cardiovascular examination   5. Primary hypertension     PLAN:    In order of problems listed above: 1.  Ascending aortic aneurysm: Will obtain CTA; start Toprol-XL 25mg  QPM. 2.  Coronary artery calcification: Start aspirin and atorvastatin 20 mg. 3.  Hyperlipidemia: Start atorvastatin 20mg  and check lipid panel, LFTs, LP(a) in 2 months. 4.  Preoperative cardiovascular examination: Will obtain an CTA to assess his aneurysm.  If it is above the threshold for elective repair (5.5cm) then I think this probably needs to be taking care of prior to spinal surgery. 5.  Hypertension: Will start Toprol-XL 25 mg at bedtime.       Shared Decision Making/Informed Consent The risks [chest pain, shortness of breath, cardiac arrhythmias, dizziness, blood pressure fluctuations, myocardial infarction, stroke/transient ischemic attack, nausea, vomiting, allergic reaction, radiation exposure, metallic taste sensation and life-threatening complications (estimated to be 1 in 10,000)], benefits (risk stratification, diagnosing coronary artery disease, treatment guidance) and alternatives of a nuclear stress test were discussed in detail with Paul Ray and he agrees to proceed.   Dispo:  Return in about 6 months (around 04/15/2023).      Medication Adjustments/Labs and Tests Ordered: Current medicines are reviewed at length with the patient today.  Concerns regarding medicines are outlined above.  The following changes have been made:      Labs/tests ordered: Orders Placed This Encounter  Procedures   CT ANGIO CHEST AORTA W/CM & OR WO/CM   Lipid Profile   Hepatic function panel   Lipoprotein A (LPA)   Cardiac Stress Test: Informed Consent Details: Physician/Practitioner Attestation; Transcribe to consent form and obtain patient signature   MYOCARDIAL PERFUSION IMAGING   EKG 12-Lead    Medication Changes: Meds ordered this encounter  Medications   metoprolol succinate (TOPROL XL) 25 MG 24 hr tablet    Sig: Take 1 tablet (25 mg total) by mouth at bedtime.    Dispense:  90 tablet    Refill:  3   atorvastatin (LIPITOR) 20 MG tablet    Sig: Take 1 tablet (20 mg total) by mouth daily.    Dispense:  90 tablet    Refill:  3   aspirin EC 81 MG tablet    Sig: Take 1 tablet (81 mg total) by mouth daily. Swallow whole.    Dispense:  90 tablet    Refill:  3     Current medicines are reviewed at length with the patient today.  The patient does not have concerns regarding medicines.   History of Present Illness:    FOCUSED PROBLEM LIST:   1.  Bicuspid aortic valve and ascending aortic aneurysm of 4.9 cm and coronary artery calcification on CTA 2023 2.  Hyperlipidemia 3.  Hospitalization for pericarditis April 2022 4.  Bipolar disorder 5.  Depression 6.  History of pericarditis April 2022 7.  Chronic back pain s/p multiple surgeries   The patient is a 67 y.o. male with the indicated medical history here for follow-up.  He  was last seen in May 2022 following his admission for pericarditis.  At that time he was doing very well.  The patient is limited by a lot of low back pain.  He has a history of multiple lumbar and cervical interventions.  He is quite limited and unable to do much of anything.  He however denies any chest pain, shortness of breath, presyncope, or syncope.  He is planning to get an injection of his low back and if this is not efficacious may require a repeat lumbar surgery.         Current  Medications: Current Meds  Medication Sig   ALPRAZolam (XANAX) 1 MG tablet TAKE 1 TABLET BY MOUTH 3 TIMES A DAY AS NEEDED FOR SLEEP   amphetamine-dextroamphetamine (ADDERALL XR) 30 MG 24 hr capsule Take 1 capsule (30 mg) by mouth every morning.   amphetamine-dextroamphetamine (ADDERALL) 20 MG tablet Take 1 tablet (20 mg) by mouth in the afternoon.   aspirin EC 81 MG tablet Take 1 tablet (81 mg total) by mouth daily. Swallow whole.   atorvastatin (LIPITOR) 20 MG tablet Take 1 tablet (20 mg total) by mouth daily.   lamoTRIgine (LAMICTAL) 200 MG tablet Take 1 tablet (200 mg total) by mouth in the morning.   metoprolol succinate (TOPROL XL) 25 MG 24 hr tablet Take 1 tablet (25 mg total) by mouth at bedtime.   oxyCODONE (OXY IR/ROXICODONE) 5 MG immediate release tablet Take by mouth every 4 (four) hours as needed.   oxycodone (OXY-IR) 5 MG capsule Take 1 capsule (5 mg total) by mouth every 4 (four) hours as needed for up to 5 days.   silodosin (RAPAFLO) 8 MG CAPS capsule Take 8 mg by mouth daily.     Allergies:    Quinolones   Social History:   Social History   Tobacco Use   Smoking status: Never   Smokeless tobacco: Never  Vaping Use   Vaping Use: Never used  Substance Use Topics   Alcohol use: No    Comment: quit drinking in 1989   Drug use: Not Currently    Types: Hydrocodone    Comment: Prior heavy use of opioid pain medications up until 2 years ago     Family Hx: Family History  Problem Relation Age of Onset   COPD Mother    Heart disease Mother    Hypertension Mother    Coronary artery disease Father    Dementia Father    Heart failure Father    Schizophrenia Brother    Breast cancer Maternal Grandmother    Prostate cancer Paternal Grandfather        also had bone cancer   Cancer Paternal Grandmother        unsure what kind     Review of Systems:   Please see the history of present illness.    All other systems reviewed and are negative.     EKGs/Labs/Other  Test Reviewed:    EKG:  EKG performed 2023 that I personally reviewed demonstrates sinus rhythm; EKG performed today that I personally reviewed demonstrates sinus rhythm.  Prior CV studies:  Cardiac Studies & Procedures     STRESS TESTS  NM MYOCAR MULTI W/SPECT W 11/10/2020  Narrative  Lexiscan stress: Baseline EKG shows relatively diffuse ST elevation. This is unchanged during infusion and into recovery. Overall electrically equivical due to baseline changes.  Myoview scan shows minimal thinning inferiorly. consistent with soft tissue attenuation (diaphragm) and probable normal perfusion No significant or  scar  LVEF calculated at 59%  Low risk scan   ECHOCARDIOGRAM  ECHOCARDIOGRAM COMPLETE 11/10/2020  Narrative ECHOCARDIOGRAM REPORT    Patient Name:   Mccrae SHAWNEE PETZOLD Date of Exam: 11/10/2020 Medical Rec #:  [id number]   Height:       68.0 in Accession #:    [id number]  Weight:       164.5 lb Date of Birth:  February 18, 1956   BSA:          1.881 m Patient Age:    32 years    BP:           118/81 mmHg Patient Gender: M           HR:           76 bpm. Exam Location:  Inpatient  Procedure: 2D Echo, Cardiac Doppler and Color Doppler  Indications:    R07.9* Chest pain, unspecified  History:        Patient has prior history of Echocardiogram examinations, most recent 06/05/2020. Signs/Symptoms:Chest Pain. H/O Covid. Pericarditis like symptoms.  Sonographer:    Merrie Roof RDCS Referring Phys: [id number] [place]   1. Left ventricular ejection fraction, by estimation, is 60 to 65%. The left ventricle has normal function. The left ventricle has no regional wall motion abnormalities. There is mild left ventricular hypertrophy. Left ventricular diastolic parameters are consistent with Grade I diastolic dysfunction (impaired relaxation). 2. Right ventricular systolic function is normal. The right ventricular size is normal. Tricuspid regurgitation signal is  inadequate for assessing PA pressure. 3. The mitral valve is normal in structure. Trivial mitral valve regurgitation. No evidence of mitral stenosis. 4. The aortic valve is previously reported as bicuspid, and appears to have fusion of right and left coronary cusps. Aortic valve regurgitation is mild. No aortic stenosis is present. 5. Aortic dilatation noted. Aneurysm of the ascending aorta, measuring 47 mm. 6. Trivial pericardial effusion.  FINDINGS Left Ventricle: Left ventricular ejection fraction, by estimation, is 60 to 65%. The left ventricle has normal function. The left ventricle has no regional wall motion abnormalities. The left ventricular internal cavity size was normal in size. There is mild left ventricular hypertrophy. Left ventricular diastolic parameters are consistent with Grade I diastolic dysfunction (impaired relaxation).  Right Ventricle: The right ventricular size is normal. No increase in right ventricular wall thickness. Right ventricular systolic function is normal. Tricuspid regurgitation signal is inadequate for assessing PA pressure.  Left Atrium: Left atrial size was normal in size.  Right Atrium: Right atrial size was normal in size.  Pericardium: Trivial pericardial effusion is present.  Mitral Valve: The mitral valve is normal in structure. Trivial mitral valve regurgitation. No evidence of mitral valve stenosis.  Tricuspid Valve: The tricuspid valve is normal in structure. Tricuspid valve regurgitation is not demonstrated. No evidence of tricuspid stenosis.  Aortic Valve: The aortic valve is bicuspid. Aortic valve regurgitation is mild. No aortic stenosis is present. Aortic valve mean gradient measures 5.0 mmHg. Aortic valve peak gradient measures 8.2 mmHg. Aortic valve area, by VTI measures 3.85 cm.  Pulmonic Valve: The pulmonic valve was normal in structure. Pulmonic valve regurgitation is trivial. No evidence of pulmonic stenosis.  Aorta: The aortic  root is normal in size and structure and aortic dilatation noted. There is an aneurysm involving the ascending aorta measuring 47 mm.  Venous: The inferior vena cava was not well visualized.  IAS/Shunts: No atrial level shunt detected by color flow Doppler.   LEFT  VENTRICLE PLAX 2D LVIDd:         4.10 cm  Diastology LVIDs:         2.40 cm  LV e' medial:    8.38 cm/s LV PW:         1.00 cm  LV E/e' medial:  11.9 LV IVS:        1.20 cm  LV e' lateral:   11.20 cm/s LVOT diam:     2.10 cm  LV E/e' lateral: 8.9 LV SV:         111 LV SV Index:   59 LVOT Area:     3.46 cm   RIGHT VENTRICLE RV Basal diam:  4.00 cm  LEFT ATRIUM             Index       RIGHT ATRIUM           Index LA diam:        3.60 cm 1.91 cm/m  RA Area:     24.70 cm LA Vol (A2C):   98.3 ml 52.27 ml/m RA Volume:   76.00 ml  40.41 ml/m LA Vol (A4C):   46.0 ml 24.46 ml/m LA Biplane Vol: 70.6 ml 37.54 ml/m AORTIC VALVE AV Area (Vmax):    3.78 cm AV Area (Vmean):   3.67 cm AV Area (VTI):     3.85 cm AV Vmax:           143.00 cm/s AV Vmean:          101.000 cm/s AV VTI:            0.289 m AV Peak Grad:      8.2 mmHg AV Mean Grad:      5.0 mmHg LVOT Vmax:         156.00 cm/s LVOT Vmean:        107.000 cm/s LVOT VTI:          0.321 m LVOT/AV VTI ratio: 1.11  AORTA Ao Root diam: 3.40 cm Ao Asc diam:  4.70 cm  MITRAL VALVE MV Area (PHT): 4.63 cm     SHUNTS MV Decel Time: 164 msec     Systemic VTI:  0.32 m MV E velocity: 99.40 cm/s   Systemic Diam: 2.10 cm MV A velocity: 111.00 cm/s MV E/A ratio:  0.90  Cherlynn Kaiser MD Electronically signed by Cherlynn Kaiser MD Signature Date/Time: 11/10/2020/2:14:02 PM    Final     CT SCANS  CT CARDIAC SCORING (SELF PAY ONLY) 06/26/2020  Addendum 06/26/2020  4:55 PM ADDENDUM REPORT: 06/26/2020 16:52  CLINICAL DATA:  Risk stratification  EXAM: Coronary Calcium Score  TECHNIQUE: The patient was scanned on a [employer] scanner.  Axial non-contrast 3 mm slices were carried out through the heart. The data set was analyzed on a dedicated work station and scored using the [place].  FINDINGS: Non-cardiac: See separate report from [facility].  Ascending Aorta: Moderate ascending aortic aneurysm measuring 4.8cm. No calcifications.  Pericardium: Normal  Coronary arteries: Normal coronary origins.  IMPRESSION: Coronary calcium score of 0. This was 0 percentile for age and sex matched control.  Moderate ascending aortic aneurysm.  Fransico Him   Electronically Signed By: Fransico Him On: 06/26/2020 16:52  Narrative EXAM: OVER-READ INTERPRETATION  CT CHEST  The following report is an over-read performed by radiologist Dr. Vinnie Langton of [facility], [place] on 06/26/2020. This over-read does not include interpretation of cardiac or coronary anatomy or pathology. The coronary  calcium score interpretation by the cardiologist is attached.  COMPARISON:  Chest CTA 06/13/2020.  FINDINGS: Aneurysmal dilatation of the ascending thoracic aorta (4.8 cm in diameter). Within the visualized portions of the thorax there are no suspicious appearing pulmonary nodules or masses, there is no acute consolidative airspace disease, no pleural effusions, no pneumothorax and no lymphadenopathy. Visualized portions of the upper abdomen are unremarkable. There are no aggressive appearing lytic or blastic lesions noted in the visualized portions of the skeleton.  IMPRESSION: 1. Ascending thoracic aortic aneurysm measuring 4.8 cm in diameter. Ascending thoracic aortic aneurysm. Recommend semi-annual imaging followup by CTA or MRA and referral to cardiothoracic surgery if not already obtained. This recommendation follows 2010 ACCF/AHA/AATS/ACR/ASA/SCA/SCAI/SIR/STS/SVM Guidelines for the Diagnosis and Management of Patients With Thoracic Aortic Disease. Circulation. 2010; [id number]. Aortic  aneurysm NOS (ICD10-I71.9).  Electronically Signed: By: Vinnie Langton M.D. On: 06/26/2020 13:44          Other studies Reviewed: Review of the additional studies/records demonstrates: None relevant  Recent Labs: 04/02/2022: ALT 13; BUN 23; Creatinine, Ser 0.87; Hemoglobin 16.8; Platelets 200; Potassium 4.2; Sodium 140   Recent Lipid Panel Lab Results  Component Value Date/Time   CHOL 193 08/13/2021 08:42 AM   TRIG 47.0 08/13/2021 08:42 AM   HDL 46.70 08/13/2021 08:42 AM   LDLCALC 137 (H) 08/13/2021 08:42 AM    Risk Assessment/Calculations:           HYPERTENSION CONTROL Vitals:   10/13/22 1435 10/13/22 1500  BP: (!) 140/80 (!) 142/80    The patient's blood pressure is elevated above target today.  In order to address the patient's elevated BP: A new medication was prescribed today.       Physical Exam:    VS:  BP (!) 142/80   Pulse 81   Ht 5\' 8"  (1.727 m)   Wt 160 lb 6.4 oz (72.8 kg)   SpO2 96%   BMI 24.39 kg/m    Wt Readings from Last 3 Encounters:  10/13/22 160 lb 6.4 oz (72.8 kg)  10/07/22 160 lb 2 oz (72.6 kg)  10/06/22 160 lb (72.6 kg)    GENERAL:  No apparent distress, AOx3 HEENT:  No carotid bruits, +2 carotid impulses, no scleral icterus CAR: RRR no murmurs, gallops, rubs, or thrills RES:  Clear to auscultation bilaterally ABD:  Soft, nontender, nondistended, positive bowel sounds x 4 VASC:  +2 radial pulses, +2 carotid pulses, palpable pedal pulses NEURO:  CN 2-12 grossly intact; motor and sensory grossly intact PSYCH:  No active depression or anxiety EXT:  No edema, ecchymosis, or cyanosis  Signed, Early Osmond, MD  10/13/2022 3:14 PM    [address], [place], [place]  [address] Phone: [phone]; Fax: [phone]   Note:  This document was prepared using Dragon voice recognition software and may include unintentional dictation errors.

## 2022-10-09 NOTE — Telephone Encounter (Signed)
Patient called an is asking to speak to Dr. Laurance Flatten, ONLY

## 2022-10-10 ENCOUNTER — Telehealth: Payer: Self-pay | Admitting: Orthopedic Surgery

## 2022-10-10 NOTE — Telephone Encounter (Signed)
I called and spoke with patient, he has decided that he wants to do the injection first.  We scheduled for 10/14/22 @ 2

## 2022-10-10 NOTE — Telephone Encounter (Signed)
Patient states he need some questions answered by [facility]Paul Ray

## 2022-10-10 NOTE — Telephone Encounter (Signed)
Patient wanting to speak to to christy. ASAP

## 2022-10-11 ENCOUNTER — Telehealth: Payer: Self-pay | Admitting: Physician Assistant

## 2022-10-11 NOTE — Telephone Encounter (Signed)
Hi, I spoke to this patient over the phone a few minutes ago about his "severe" back pain.  Offered to send in a steroid and muscle relaxer, but he was not interested.  I felt like I was going in circles with this guy and finally told him that he needed to call you on Monday with further questions.  lindsey

## 2022-10-11 NOTE — Telephone Encounter (Signed)
Hi,  I spoke to this patient a few minutes ago.  He called with "severe" back pain.  Currently on oxy.  I offered sending in a steroid pack/muscle relaxer but he declined.  Suggested heat as well.  He started asking question about walking, etc and I told him to let symptoms be his guide.  I felt like I was going in circles with him and not getting anywhere and finally told him that he needed to call you next week.    Thanks,  lindsey

## 2022-10-13 ENCOUNTER — Encounter: Payer: Self-pay | Admitting: Internal Medicine

## 2022-10-13 ENCOUNTER — Encounter: Payer: Self-pay | Admitting: *Deleted

## 2022-10-13 ENCOUNTER — Ambulatory Visit: Payer: PPO | Attending: Internal Medicine | Admitting: Internal Medicine

## 2022-10-13 ENCOUNTER — Telehealth: Payer: Self-pay | Admitting: Family Medicine

## 2022-10-13 ENCOUNTER — Telehealth: Payer: Self-pay | Admitting: Orthopedic Surgery

## 2022-10-13 ENCOUNTER — Ambulatory Visit: Payer: PPO | Admitting: Internal Medicine

## 2022-10-13 ENCOUNTER — Telehealth: Payer: Self-pay | Admitting: *Deleted

## 2022-10-13 VITALS — BP 142/80 | HR 81 | Ht 68.0 in | Wt 160.4 lb

## 2022-10-13 DIAGNOSIS — I7121 Aneurysm of the ascending aorta, without rupture: Secondary | ICD-10-CM | POA: Diagnosis not present

## 2022-10-13 DIAGNOSIS — I10 Essential (primary) hypertension: Secondary | ICD-10-CM

## 2022-10-13 DIAGNOSIS — E785 Hyperlipidemia, unspecified: Secondary | ICD-10-CM | POA: Diagnosis not present

## 2022-10-13 DIAGNOSIS — I2510 Atherosclerotic heart disease of native coronary artery without angina pectoris: Secondary | ICD-10-CM

## 2022-10-13 DIAGNOSIS — Z01810 Encounter for preprocedural cardiovascular examination: Secondary | ICD-10-CM

## 2022-10-13 MED ORDER — OXYCODONE HCL 5 MG PO CAPS: 5.0000 mg | ORAL_CAPSULE | ORAL | 0 refills | Status: DC | PRN

## 2022-10-13 MED ORDER — ATORVASTATIN CALCIUM 20 MG PO TABS: 20.0000 mg | ORAL_TABLET | Freq: Every day | ORAL | 3 refills | Status: DC

## 2022-10-13 MED ORDER — METOPROLOL SUCCINATE ER 25 MG PO TB24: 25.0000 mg | ORAL_TABLET | Freq: Every day | ORAL | 3 refills | Status: DC

## 2022-10-13 MED ORDER — ASPIRIN 81 MG PO TBEC: 81.0000 mg | DELAYED_RELEASE_TABLET | Freq: Every day | ORAL | 3 refills | Status: DC

## 2022-10-13 NOTE — Telephone Encounter (Signed)
Pt will have a surgical clearance tomorrow and is asking if you will be drawing blood

## 2022-10-13 NOTE — Telephone Encounter (Signed)
Unable to LM informing pt he will be getting blood draw in the morning, if there are more questions we can discuss at his appt in the morning

## 2022-10-13 NOTE — Addendum Note (Signed)
Addended by: Thompson Grayer on: 10/13/2022 03:27 PM   Modules accepted: Orders

## 2022-10-13 NOTE — Telephone Encounter (Signed)
Patient called stating that he doesn't need a surgical clearance. Patient gave me the okay to cancel his appt on 11/14/22.

## 2022-10-13 NOTE — Telephone Encounter (Signed)
Pt informed he does need to keep an appointment

## 2022-10-13 NOTE — Addendum Note (Signed)
Addended by: Ileene Rubens on: 10/13/2022 09:34 AM   Modules accepted: Orders

## 2022-10-13 NOTE — Telephone Encounter (Signed)
LM informing pt.

## 2022-10-13 NOTE — Telephone Encounter (Signed)
I called and advised his pain meds were sent to [facility]. Patient states that he is scheduled for Cards tomorrow and for surgery clearance, however he states that he saw his PCP last week and they want him to come back in next week to be seen for again for clearance, I advised that he needs to call them back and speak with someone that can answer his questions regarding why he needs another appt for getting clearance for surgery when he was just seen, I advised that it could be for EKG but his Cards should do that, but if that's the case they can send it over to the PCP and that should be sufficient, however he needs to call someone at the office that can explain to him, and NOT speak with whomever answers the phone, he should speak with the triage nurse about this. He states that he understands thus and will call them  back.

## 2022-10-13 NOTE — Telephone Encounter (Signed)
Caller name: ZED KANALEY  On DPR?: Yes  Call back number: [phone] (home)  Provider they see: Evangeline Gula, NP  Reason for call:Pt calling to see if going to the cardiologist tomorrow asking if they will give him surgical clearance, if he'll cancel appt here. - advise.

## 2022-10-13 NOTE — Telephone Encounter (Signed)
Patient is scheduled for an office visit today with Dr. Ali Lowe.  Appointment notes were updated and RN cover was alerted that visit needs to address preoperative clearance.  Will remove request from preop pool.  Justice Britain. Hellena Pridgen, DNP, NP-C  10/13/2022, 1:50 PM [place] [address] Office [phone] Fax [phone]

## 2022-10-13 NOTE — Telephone Encounter (Signed)
   Pre-operative Risk Assessment    Patient Name: Paul Ray  DOB: June 20, 1956 MRN: [id number]      Request for Surgical Clearance    Procedure:  L1-3 AMINECTOMIES, L1-2 DISECTOMY  Date of Surgery:  Clearance TBD                                 Surgeon:  Ileene Rubens, MD Surgeon's Group or Practice Name:  [facility] Phone number:  [phone] Fax number:  [phone]   Type of Clearance Requested:   - Medical    Type of Anesthesia:  Not Indicated   Additional requests/questions:    Astrid Divine   10/13/2022, 12:57 PM

## 2022-10-13 NOTE — Patient Instructions (Signed)
Medication Instructions:  Your physician has recommended you make the following change in your medication:  Start Metoprolol Succinate 25 mg by mouth daily at bedtime. Start Atorvastatin 20 mg by mouth daily Start Aspirin 81 mg by mouth daily   *If you need a refill on your cardiac medications before your next appointment, please call your pharmacy*   Lab Work: Your physician recommends that you return for lab work in: 2 months--Lipid,liver and Lipoprotein (a)  If you have labs (blood work) drawn today and your tests are completely normal, you will receive your results only by: [place] (if you have MyChart) OR A paper copy in the mail If you have any lab test that is abnormal or we need to change your treatment, we will call you to review the results.   Testing/Procedures: Your physician has requested that you have a lexiscan myoview. For further information please visit [email or web address]. Please follow instruction sheet, as given.  Non-Cardiac CT Angiography (CTA), is a special type of CT scan that uses a computer to produce multi-dimensional views of major blood vessels throughout the body. In CT angiography, a contrast material is injected through an IV to help visualize the blood vessels    Follow-Up: At [facility], you and your health needs are our priority.  As part of our continuing mission to provide you with exceptional heart care, we have created designated Provider Care Teams.  These Care Teams include your primary Cardiologist (physician) and Advanced Practice Providers (APPs -  Physician Assistants and Nurse Practitioners) who all work together to provide you with the care you need, when you need it.  We recommend signing up for the patient portal called "MyChart".  Sign up information is provided on this After Visit Summary.  MyChart is used to connect with patients for Virtual Visits (Telemedicine).  Patients are able to view lab/test results, encounter  notes, upcoming appointments, etc.  Non-urgent messages can be sent to your provider as well.   To learn more about what you can do with MyChart, go to [email or web address].    Your next appointment:   6 month(s)  Provider:   Nicholes Rough, PA-C, Melina Copa, PA-C, Ambrose Pancoast, NP, Ermalinda Barrios, PA-C, Christen Bame, NP, or Richardson Dopp, PA-C          Other Instructions

## 2022-10-13 NOTE — Telephone Encounter (Signed)
Patient wants a rx for pain medication, has also want to speak to [facility]

## 2022-10-13 NOTE — Telephone Encounter (Signed)
Pt has called back asking is Paul Ray going to draw blood?

## 2022-10-14 ENCOUNTER — Ambulatory Visit: Payer: PPO | Admitting: Family Medicine

## 2022-10-14 ENCOUNTER — Encounter: Payer: PPO | Admitting: Physical Medicine and Rehabilitation

## 2022-10-14 ENCOUNTER — Telehealth: Payer: Self-pay | Admitting: Orthopedic Surgery

## 2022-10-14 NOTE — Telephone Encounter (Signed)
Called ortho. Left vm for scheduling nurse to call and confirm this patient does not need a surgical clearance.

## 2022-10-14 NOTE — Telephone Encounter (Signed)
Pt has appt. 10/15/2022 for this clearance.

## 2022-10-14 NOTE — Telephone Encounter (Signed)
April from [facility] said she will call this pt and find out why he canceled this appt. With [place]. Pt has to have clearance with [place] in order to have surgery. April will call back today.

## 2022-10-14 NOTE — Telephone Encounter (Signed)
Patient want to speak to [place], and he is having a lot of pain..please call ASAP

## 2022-10-14 NOTE — Telephone Encounter (Signed)
I called patient, he just wanted to let me know that he is hurting and that he was going to start alternating between heat and ice.

## 2022-10-15 ENCOUNTER — Telehealth: Payer: Self-pay | Admitting: Orthopedic Surgery

## 2022-10-15 ENCOUNTER — Encounter: Payer: Self-pay | Admitting: Family Medicine

## 2022-10-15 ENCOUNTER — Ambulatory Visit (INDEPENDENT_AMBULATORY_CARE_PROVIDER_SITE_OTHER): Payer: PPO | Admitting: Family Medicine

## 2022-10-15 ENCOUNTER — Telehealth: Payer: Self-pay | Admitting: Radiology

## 2022-10-15 VITALS — BP 132/84 | HR 70 | Temp 98.0°F | Ht 68.0 in | Wt 161.0 lb

## 2022-10-15 DIAGNOSIS — Z1329 Encounter for screening for other suspected endocrine disorder: Secondary | ICD-10-CM | POA: Diagnosis not present

## 2022-10-15 DIAGNOSIS — I2510 Atherosclerotic heart disease of native coronary artery without angina pectoris: Secondary | ICD-10-CM

## 2022-10-15 DIAGNOSIS — Z01818 Encounter for other preprocedural examination: Secondary | ICD-10-CM

## 2022-10-15 DIAGNOSIS — R7309 Other abnormal glucose: Secondary | ICD-10-CM

## 2022-10-15 DIAGNOSIS — Z125 Encounter for screening for malignant neoplasm of prostate: Secondary | ICD-10-CM | POA: Diagnosis not present

## 2022-10-15 DIAGNOSIS — E785 Hyperlipidemia, unspecified: Secondary | ICD-10-CM

## 2022-10-15 MED ORDER — OXYCODONE HCL 5 MG PO CAPS: 5.0000 mg | ORAL_CAPSULE | ORAL | 0 refills | Status: DC | PRN

## 2022-10-15 NOTE — Patient Instructions (Signed)
-  Ordered labs for surgical clearance, physical, and labs from cardiology. Office will call with lab results and you will see results in [place]. Also, will send labs to cardiology.  -If labs are stable, this provider will sign off for surgical clearance.  -Follow up as scheduled for physical in May.

## 2022-10-15 NOTE — Progress Notes (Signed)
Surgical Clearance    Subjective:  Patient ID: Paul Ray, male    DOB: 21-Mar-1956  Age: 67 y.o. MRN: [id number]  Chief Complaint  Patient presents with   Medical Clearance    Pt is here today for medical clearance. Will need labs. Pt is requesting he has his physical labs done today in addition to the labs from [place]. Pt reports he has a injection in his back  for 10/16/2022.    HPI Patient is here today for a surgical clearance for major orthopedic surgery, L1-L3 Laminectomies, L1-2 Disectomy, by [facility]. Surgery date has not been scheduled. Patient is also being cleared by cardiology. He has an aneurysm of ascending aorta without rupture, hypertension, hyperlipidemia, and coronary artery calcification seen on CAT scan. He reports he has not started taking Toprol-XR 25mg  daily in the PM and Atorvastatin 20mg  that cardiology prescribed. He reports he has started taking ASA 81mg  daily.   Patient last seen pulmonary in February 2022 for pleural effusion back in 2012 and empyema lung. Prior to that appointment, he had multiple CT chest that showed clear lungs and no recurrence of fluid.He denies any chest pain, shortness of breath, cough, and lungs sounds clear on exam.    Review of Systems  Constitutional:  Negative for chills, diaphoresis, fever, malaise/fatigue and weight loss.  Respiratory:  Negative for cough, hemoptysis, shortness of breath and wheezing.   Cardiovascular:  Negative for chest pain, palpitations and leg swelling.  Gastrointestinal:  Positive for constipation (A little constipation with taking Oxycodone). Negative for abdominal pain, blood in stool, diarrhea, heartburn, nausea and vomiting.  Musculoskeletal:  Positive for back pain.  Neurological:  Positive for tingling (Right hip to knee, related to lumbar spine). Negative for dizziness, seizures, weakness and headaches.   See HPI above     Objective:    BP 132/84 (BP Location: Left Arm,  Cuff Size: Normal)   Pulse 70   Temp 98 F (36.7 C)   Ht 5\' 8"  (1.727 m)   Wt 161 lb (73 kg)   SpO2 99%   BMI 24.48 kg/m  2nd blood pressure 132/84 in office.    Physical Exam Vitals reviewed.  Constitutional:      General: He is not in acute distress.    Appearance: Normal appearance. He is not ill-appearing, toxic-appearing or diaphoretic.  Eyes:     General:        Right eye: No discharge.        Left eye: No discharge.     Conjunctiva/sclera: Conjunctivae normal.     Comments: Wears glasses   Cardiovascular:     Rate and Rhythm: Normal rate and regular rhythm.     Pulses:          Dorsalis pedis pulses are 3+ on the right side and 3+ on the left side.     Heart sounds: Normal heart sounds. No murmur heard.    No friction rub. No gallop.  Pulmonary:     Effort: Pulmonary effort is normal. No respiratory distress.     Breath sounds: Normal breath sounds.  Musculoskeletal:        General: Normal range of motion.     Right lower leg: No edema.     Left lower leg: No edema.  Skin:    General: Skin is warm and dry.  Neurological:     General: No focal deficit present.     Mental Status: He is alert and oriented  to person, place, and time. Mental status is at baseline.  Psychiatric:        Mood and Affect: Mood normal.        Behavior: Behavior normal.        Thought Content: Thought content normal.        Judgment: Judgment normal.    The 10-year ASCVD risk score (Arnett DK, et al., 2019) is: 17.4%   Assessment & Plan:  Pre-operative clearance -     CBC with Differential/Platelet -     Basic metabolic panel  Prostate cancer screening -     PSA  Elevated glucose level -     Hemoglobin A1c  Screening for endocrine disorder -     TSH  Coronary artery calcification seen on CAT scan -     Lipoprotein A (LPA) -     Hepatic function panel -     Lipid panel  Hyperlipidemia LDL goal <70 -     Lipoprotein A (LPA) -     Hepatic function panel -     Lipid  panel  1.Ordered CBC and CMP for pre-operative clearance; ordered PSA, A1c, TSH for screening of prostate cancer screening and endocrine for physical labs, and had labs drawn that cardiology had ordered.  2. If labs are stable, will sign off for surgical clearance from a PCP standpoint. He is still obtaining clearance from cardiology. With no complaints from a pulmonary standpoint and has previously had CT chest scans with clear lungs and no fluid. Do not see that he needs to be cleared from pulmonary. He was to follow up with pulmonary as needed.  3. Encouraged patient to start taking Toprol-XR and Atorvastatin 20mg  that cardiology prescribed.  4.He has a physical scheduled in May. Will order hepatic function lab to assess his liver from starting Atorvastatin.    Valarie Merino, NP

## 2022-10-15 NOTE — Addendum Note (Signed)
Addended by: Ileene Rubens on: 10/15/2022 02:28 PM   Modules accepted: Orders

## 2022-10-15 NOTE — Telephone Encounter (Signed)
Per Brittney, c-arm is now running and patient can come for his appointment tomorrow afternoon.

## 2022-10-15 NOTE — Telephone Encounter (Signed)
Patient requesting refill on his Oxycodone 5mg 

## 2022-10-15 NOTE — Telephone Encounter (Signed)
Attempted to call patient to inform him to disregard last call and to come in for injection on 10/16/22

## 2022-10-15 NOTE — Telephone Encounter (Signed)
I called patient to advise machine is still down and we are unable to accommodate his appointment tomorrow with Dr. Ernestina Patches. Was going to offer next appointment with Dr. Ernestina Patches or see if he would like for me to enter referral for [facility] to see if they can get him in sooner. Voicemail is full and I was unable to leave message.

## 2022-10-16 ENCOUNTER — Ambulatory Visit (INDEPENDENT_AMBULATORY_CARE_PROVIDER_SITE_OTHER): Payer: PPO | Admitting: Physical Medicine and Rehabilitation

## 2022-10-16 ENCOUNTER — Other Ambulatory Visit: Payer: Self-pay

## 2022-10-16 VITALS — BP 153/81 | HR 75

## 2022-10-16 DIAGNOSIS — M5416 Radiculopathy, lumbar region: Secondary | ICD-10-CM

## 2022-10-16 DIAGNOSIS — M5116 Intervertebral disc disorders with radiculopathy, lumbar region: Secondary | ICD-10-CM

## 2022-10-16 DIAGNOSIS — M48062 Spinal stenosis, lumbar region with neurogenic claudication: Secondary | ICD-10-CM

## 2022-10-16 LAB — CBC WITH DIFFERENTIAL/PLATELET
Basophils Absolute: 0.1 10*3/uL (ref 0.0–0.1)
Basophils Relative: 1 % (ref 0.0–3.0)
Eosinophils Absolute: 0.1 10*3/uL (ref 0.0–0.7)
Eosinophils Relative: 1.3 % (ref 0.0–5.0)
HCT: 45.8 % (ref 39.0–52.0)
Hemoglobin: 15.8 g/dL (ref 13.0–17.0)
Lymphocytes Relative: 16.3 % (ref 12.0–46.0)
Lymphs Abs: 1.4 10*3/uL (ref 0.7–4.0)
MCHC: 34.5 g/dL (ref 30.0–36.0)
MCV: 85.8 fl (ref 78.0–100.0)
Monocytes Absolute: 0.6 10*3/uL (ref 0.1–1.0)
Monocytes Relative: 7.1 % (ref 3.0–12.0)
Neutro Abs: 6.5 10*3/uL (ref 1.4–7.7)
Neutrophils Relative %: 74.3 % (ref 43.0–77.0)
Platelets: 240 10*3/uL (ref 150.0–400.0)
RBC: 5.33 Mil/uL (ref 4.22–5.81)
RDW: 13.1 % (ref 11.5–15.5)
WBC: 8.7 10*3/uL (ref 4.0–10.5)

## 2022-10-16 LAB — BASIC METABOLIC PANEL
BUN: 20 mg/dL (ref 6–23)
CO2: 32 mEq/L (ref 19–32)
Calcium: 9.5 mg/dL (ref 8.4–10.5)
Chloride: 98 mEq/L (ref 96–112)
Creatinine, Ser: 0.72 mg/dL (ref 0.40–1.50)
GFR: 95.65 mL/min (ref >60.00)
Glucose, Bld: 86 mg/dL (ref 70–99)
Potassium: 4 mEq/L (ref 3.5–5.1)
Sodium: 138 mEq/L (ref 135–145)

## 2022-10-16 LAB — PSA: PSA: 0.77 ng/mL (ref 0.10–4.00)

## 2022-10-16 LAB — TSH: TSH: 2.42 u[IU]/mL (ref 0.35–5.50)

## 2022-10-16 LAB — HEMOGLOBIN A1C: Hgb A1c MFr Bld: 5.3 % (ref 4.6–6.5)

## 2022-10-16 MED ORDER — DEXAMETHASONE SODIUM PHOSPHATE 10 MG/ML IJ SOLN
15.0000 mg | Freq: Once | INTRAMUSCULAR | Status: AC
  Administered 2022-10-16: 15 mg

## 2022-10-16 NOTE — Progress Notes (Signed)
Functional Pain Scale - descriptive words and definitions  Intense (8)    Cannot complete any ADLs without much assistance/cannot concentrate/conversation is difficult/unable to sleep and unable to use distraction. Severe range order  Average Pain 9   +Driver, -BT, -Dye Allergies.  Lower back pain on both sides

## 2022-10-16 NOTE — Patient Instructions (Signed)

## 2022-10-17 LAB — HEPATIC FUNCTION PANEL
ALT: 10 IU/L (ref 0–44)
AST: 21 IU/L (ref 0–40)
Albumin: 4.9 g/dL (ref 3.9–4.9)
Alkaline Phosphatase: 85 IU/L (ref 44–121)
Bilirubin Total: 0.7 mg/dL (ref 0.0–1.2)
Bilirubin, Direct: 0.14 mg/dL (ref 0.00–0.40)
Total Protein: 7.3 g/dL (ref 6.0–8.5)

## 2022-10-17 LAB — LIPID PANEL
Chol/HDL Ratio: 3.3 ratio (ref 0.0–5.0)
Cholesterol, Total: 160 mg/dL (ref 100–199)
HDL: 48 mg/dL (ref >39)
LDL Chol Calc (NIH): 99 mg/dL (ref 0–99)
Triglycerides: 68 mg/dL (ref 0–149)
VLDL Cholesterol Cal: 13 mg/dL (ref 5–40)

## 2022-10-17 LAB — LIPOPROTEIN A (LPA): Lipoprotein (a): 22.3 nmol/L (ref <75.0)

## 2022-10-20 ENCOUNTER — Telehealth: Payer: Self-pay | Admitting: Internal Medicine

## 2022-10-20 NOTE — Telephone Encounter (Signed)
Spoke to patient regarding medical necessity of cardiac testing prior to spinal fusion surgery. Explained risks of general anesthesia. Patient also asking why he has to be on metoprolol and statin therapy when "I am not sick, I am healthy". Reviewed elevated BP's that were charted at last visit with Dr. Ali Lowe as well as elevated LDL on labs and coronary artery calcification found on CT. Patient verbalizes understanding and states he will call back if he has any further questions.

## 2022-10-20 NOTE — Telephone Encounter (Signed)
Patient wants to know if the MYOCARDIAL PERFUSION test is required to clear him for his surgery.  Patient states he does not think this test is necessary.    Patient also stated he wants a call back to discuss his medications - metoprolol succinate (TOPROL XL) 25 MG 24 hr tablet  and atorvastatin (LIPITOR) 20 MG tablet and stated he has not starting taking these medications yet.

## 2022-10-21 ENCOUNTER — Telehealth: Payer: Self-pay | Admitting: Orthopedic Surgery

## 2022-10-21 ENCOUNTER — Telehealth: Payer: Self-pay | Admitting: Behavioral Health

## 2022-10-21 ENCOUNTER — Other Ambulatory Visit: Payer: Self-pay | Admitting: Orthopedic Surgery

## 2022-10-21 DIAGNOSIS — M5441 Lumbago with sciatica, right side: Secondary | ICD-10-CM

## 2022-10-21 MED ORDER — OXYCODONE HCL 5 MG PO CAPS: 5.0000 mg | ORAL_CAPSULE | ORAL | 0 refills | Status: AC | PRN

## 2022-10-21 NOTE — Telephone Encounter (Signed)
Pt called requesting a refill pain medication. Please send to pharmacy on file. Pt also asking for a call back concerning a injection he received from Dr Ernestina Patches. Please call pt at [phone].

## 2022-10-21 NOTE — Telephone Encounter (Signed)
I called and spoke with patient, I advised that his meds were sent in, he states that Dr. 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 He states that his back feels some better but that the numbness in the thigh seems worse, I advised we have to give it 2 wks, and he asked if he could PT to get some home exercises to do. The referral has been placed for PT and they will call him to schedule.

## 2022-10-22 ENCOUNTER — Telehealth: Payer: Self-pay | Admitting: Family Medicine

## 2022-10-22 NOTE — Progress Notes (Signed)
DARLE NEWBERG - 67 y.o. male MRN [id number]  Date of birth: [birth year]  Office Visit Note: Visit Date: 10/16/2022 PCP: Evangeline Gula, NP Referred by: Evangeline Gula, NP  Subjective: Chief Complaint  Patient presents with   Lower Back - Pain   HPI:  TAYSOM ZENTNER is a 67 y.o. male who comes in today at the request of Dr. Ileene Rubens and Dr. Rodell Perna for planned Bilateral L1-2 Lumbar Transforaminal epidural steroid injection with fluoroscopic guidance.  The patient has failed conservative care including home exercise, medications, time and activity modification.  This injection will be diagnostic and hopefully therapeutic.  Please see requesting physician notes for further details and justification.   ROS Otherwise per HPI.  Assessment & Plan: Visit Diagnoses:    ICD-10-CM   1. Spinal stenosis of lumbar region with neurogenic claudication  M48.062 XR C-ARM NO REPORT    Epidural Steroid injection    dexamethasone (DECADRON) injection 15 mg    2. Lumbar radiculopathy  M54.16     3. Radiculopathy due to lumbar intervertebral disc disorder  M51.16       Plan: No additional findings.   Meds & Orders:  Meds ordered this encounter  Medications   dexamethasone (DECADRON) injection 15 mg    Orders Placed This Encounter  Procedures   XR C-ARM NO REPORT   Epidural Steroid injection    Follow-up: Return for visit to requesting provider as needed.   Procedures: No procedures performed  Lumbosacral Transforaminal Epidural Steroid Injection - Sub-Pedicular Approach with Fluoroscopic Guidance  Patient: ILIR GUYOT      Date of Birth: June 03, 1956 MRN: [id number] PCP: Evangeline Gula, NP      Visit Date: 10/16/2022   Universal Protocol:    Date/Time: 10/16/2022  Consent Given By: the patient  Position: PRONE  Additional Comments: Vital signs were monitored before and after the procedure. Patient was prepped and draped in the usual sterile fashion. The  correct patient, procedure, and site was verified.   Injection Procedure Details:   Procedure diagnoses: Spinal stenosis of lumbar region with neurogenic claudication [M48.062]    Meds Administered:  Meds ordered this encounter  Medications   dexamethasone (DECADRON) injection 15 mg    Laterality: Bilateral  Location/Site: L1  Needle:5.0 in., 22 ga.  Short bevel or Quincke spinal needle  Needle Placement: Transforaminal  Findings:    -Comments: Excellent flow of contrast along the nerve, nerve root and into the epidural space.  Procedure Details: After squaring off the end-plates to get a true AP view, the C-arm was positioned so that an oblique view of the foramen as noted above was visualized. The target area is just inferior to the "nose of the scotty dog" or sub pedicular. The soft tissues overlying this structure were infiltrated with 2-3 ml. of 1% Lidocaine without Epinephrine.  The spinal needle was inserted toward the target using a "trajectory" view along the fluoroscope beam.  Under AP and lateral visualization, the needle was advanced so it did not puncture dura and was located close the 6 O'Clock position of the pedical in AP tracterory. Biplanar projections were used to confirm position. Aspiration was confirmed to be negative for CSF and/or blood. A 1-2 ml. volume of Isovue-250 was injected and flow of contrast was noted at each level. Radiographs were obtained for documentation purposes.   After attaining the desired flow of contrast documented above, a 0.5 to 1.0 ml test dose of 0.25% Marcaine was  injected into each respective transforaminal space.  The patient was observed for 90 seconds post injection.  After no sensory deficits were reported, and normal lower extremity motor function was noted,   the above injectate was administered so that equal amounts of the injectate were placed at each foramen (level) into the transforaminal epidural space.   Additional  Comments:  No complications occurred Dressing: 2 x 2 sterile gauze and Band-Aid    Post-procedure details: Patient was observed during the procedure. Post-procedure instructions were reviewed.  Patient left the clinic in stable condition.    Clinical History: MRI LUMBAR SPINE WITHOUT CONTRAST   TECHNIQUE: Multiplanar, multisequence MR imaging of the lumbar spine was performed. No intravenous contrast was administered.   COMPARISON:  Lumbar spine MRI 07/14/2021.   FINDINGS: Segmentation: Conventional numbering is assumed with 5 non-rib-bearing, lumbar type vertebral bodies.   Alignment: 3 mm anterolisthesis of L4 on L5. Trace retrolisthesis of L1 on L2.   Vertebrae: Modic type 1 degenerative endplate marrow signal changes at L1-2.   Conus medullaris and cauda equina: Conus extends to the L1 level. Conus and cauda equina appear normal.   Paraspinal and other soft tissues: Unremarkable.   Disc levels:   Congenitally short pedicles throughout the lumbar spine.   T12-L1: Small disc bulge without spinal canal stenosis or neural foraminal narrowing.   L1-L2: Worsening disc bulge with new superimposed right central disc extrusion caudal migration measuring a proximally 14 mm (sagittal image 7 series 3), results in severe spinal canal stenosis with superimposed compression of the traversing nerve roots in the right lateral recess. Facet arthropathy contributes to mild bilateral neural foraminal narrowing, unchanged.   L2-L3: Slightly increased disc bulge and facet arthropathy results in severe spinal canal stenosis, worse from prior, and mild bilateral neural foraminal narrowing, unchanged.   L3-L4: Prior right hemilaminotomy. Interval resolution of previously seen central disc extrusion. Residual disc bulge contributes to moderate spinal canal stenosis, improved from prior. Bilateral facet arthropathy contributes to mild bilateral neural foraminal narrowing,  unchanged.   L4-L5: Disc bulge and facet arthropathy results in mild spinal canal stenosis and mild bilateral neural foraminal narrowing, unchanged.   L5-S1: Small disc bulge without spinal canal stenosis. Bilateral facet arthropathy results in moderate left and mild right neural foraminal narrowing, unchanged.   IMPRESSION: 1. Worsening degenerative disc disease at L1-L2 with a new right central disc extrusion and caudal migration, resulting in severe spinal canal stenosis with superimposed compression of the traversing nerve roots in the right lateral recess. 2. Slightly increased disc bulge at L2-L3 results in severe spinal canal stenosis, worse from prior. 3. Prior right hemilaminotomy at L3-L4. Interval resolution of previously seen central disc extrusion. Residual disc bulge contributes to moderate spinal canal stenosis, improved from prior.     Electronically Signed   By: Emmit Alexanders M.D.   On: 10/01/2022 14:24     Objective:  VS:  HT:    WT:   BMI:     BP:(!) 153/81  HR:75bpm  TEMP: ( )  RESP:  Physical Exam Vitals and nursing note reviewed.  Constitutional:      General: He is not in acute distress.    Appearance: Normal appearance. He is not ill-appearing.  HENT:     Head: Normocephalic and atraumatic.     Right Ear: External ear normal.     Left Ear: External ear normal.     Nose: No congestion.  Eyes:     Extraocular Movements: Extraocular movements intact.  Cardiovascular:     Rate and Rhythm: Normal rate.     Pulses: Normal pulses.  Pulmonary:     Effort: Pulmonary effort is normal. No respiratory distress.  Abdominal:     General: There is no distension.     Palpations: Abdomen is soft.  Musculoskeletal:        General: No tenderness or signs of injury.     Cervical back: Neck supple.     Right lower leg: No edema.     Left lower leg: No edema.     Comments: Patient has good distal strength without clonus.  Skin:    Findings: No erythema  or rash.  Neurological:     General: No focal deficit present.     Mental Status: He is alert and oriented to person, place, and time.     Sensory: No sensory deficit.     Motor: No weakness or abnormal muscle tone.     Coordination: Coordination normal.  Psychiatric:        Mood and Affect: Mood normal.        Behavior: Behavior normal.      Imaging: No results found.

## 2022-10-22 NOTE — Procedures (Signed)
Lumbosacral Transforaminal Epidural Steroid Injection - Sub-Pedicular Approach with Fluoroscopic Guidance  Patient: Paul Ray      Date of Birth: 01/28/1956 MRN: [id number] PCP: Evangeline Gula, NP      Visit Date: 10/16/2022   Universal Protocol:    Date/Time: 10/16/2022  Consent Given By: the patient  Position: PRONE  Additional Comments: Vital signs were monitored before and after the procedure. Patient was prepped and draped in the usual sterile fashion. The correct patient, procedure, and site was verified.   Injection Procedure Details:   Procedure diagnoses: Spinal stenosis of lumbar region with neurogenic claudication [M48.062]    Meds Administered:  Meds ordered this encounter  Medications   dexamethasone (DECADRON) injection 15 mg    Laterality: Bilateral  Location/Site: L1  Needle:5.0 in., 22 ga.  Short bevel or Quincke spinal needle  Needle Placement: Transforaminal  Findings:    -Comments: Excellent flow of contrast along the nerve, nerve root and into the epidural space.  Procedure Details: After squaring off the end-plates to get a true AP view, the C-arm was positioned so that an oblique view of the foramen as noted above was visualized. The target area is just inferior to the "nose of the scotty dog" or sub pedicular. The soft tissues overlying this structure were infiltrated with 2-3 ml. of 1% Lidocaine without Epinephrine.  The spinal needle was inserted toward the target using a "trajectory" view along the fluoroscope beam.  Under AP and lateral visualization, the needle was advanced so it did not puncture dura and was located close the 6 O'Clock position of the pedical in AP tracterory. Biplanar projections were used to confirm position. Aspiration was confirmed to be negative for CSF and/or blood. A 1-2 ml. volume of Isovue-250 was injected and flow of contrast was noted at each level. Radiographs were obtained for documentation purposes.    After attaining the desired flow of contrast documented above, a 0.5 to 1.0 ml test dose of 0.25% Marcaine was injected into each respective transforaminal space.  The patient was observed for 90 seconds post injection.  After no sensory deficits were reported, and normal lower extremity motor function was noted,   the above injectate was administered so that equal amounts of the injectate were placed at each foramen (level) into the transforaminal epidural space.   Additional Comments:  No complications occurred Dressing: 2 x 2 sterile gauze and Band-Aid    Post-procedure details: Patient was observed during the procedure. Post-procedure instructions were reviewed.  Patient left the clinic in stable condition.

## 2022-10-22 NOTE — Telephone Encounter (Signed)
Error, no note intended 

## 2022-10-22 NOTE — Telephone Encounter (Signed)
Contacted [name] to schedule their annual wellness visit. Patient declined to schedule AWV at this time.  Thank you,  [place] Direct dial  [phone]

## 2022-10-27 ENCOUNTER — Ambulatory Visit (INDEPENDENT_AMBULATORY_CARE_PROVIDER_SITE_OTHER): Payer: PPO | Admitting: Physical Therapy

## 2022-10-27 ENCOUNTER — Other Ambulatory Visit: Payer: Self-pay

## 2022-10-27 ENCOUNTER — Encounter: Payer: Self-pay | Admitting: Physical Therapy

## 2022-10-27 ENCOUNTER — Telehealth: Payer: Self-pay | Admitting: Orthopedic Surgery

## 2022-10-27 DIAGNOSIS — R262 Difficulty in walking, not elsewhere classified: Secondary | ICD-10-CM

## 2022-10-27 DIAGNOSIS — M5459 Other low back pain: Secondary | ICD-10-CM

## 2022-10-27 DIAGNOSIS — M6281 Muscle weakness (generalized): Secondary | ICD-10-CM

## 2022-10-27 MED ORDER — OXYCODONE HCL 5 MG PO TABS: 5.0000 mg | ORAL_TABLET | ORAL | 0 refills | Status: DC | PRN

## 2022-10-27 NOTE — Telephone Encounter (Signed)
Patient called, would like a refill on oxycodone.

## 2022-10-27 NOTE — Therapy (Signed)
OUTPATIENT PHYSICAL THERAPY THORACOLUMBAR EVALUATION   Patient Name: Paul Ray MRN: [id number] DOB:1955-10-20, 67 y.o., male Today's Date: 10/27/2022  END OF SESSION:  PT End of Session - 10/27/22 1445     Visit Number 1    Number of Visits 10    Date for PT Re-Evaluation 12/08/22    Progress Note Due on Visit 10    PT Start Time [id number]    PT Stop Time 1435    PT Time Calculation (min) 50 min    Activity Tolerance Patient tolerated treatment well    Behavior During Therapy [facility] for tasks assessed/performed             Past Medical History:  Diagnosis Date   Abscess of lung(513.0)    Right lower lobe   ADD (attention deficit disorder)    Anxiety    Arthritis    Ascending aortic aneurysm    26mm by Chest CTA 05/2020 and 85mm by echo 05/2020   Benign essential HTN    Bipolar depression    Coronary artery calcification seen on CAT scan    Empyema lung    Hemorrhoids    Hepatitis C    Pneumonia    last year   Prostate abscess    Viral pericarditis    after COVID 19 infection   Past Surgical History:  Procedure Laterality Date   ELBOW SURGERY  2005   Right   FOOT NEUROMA SURGERY  2006   Left   HIP RESURFACING     left hip at [facility] 01/2011   LUMBAR LAMINECTOMY/DECOMPRESSION MICRODISCECTOMY  07/24/2011   Procedure: LUMBAR LAMINECTOMY/DECOMPRESSION MICRODISCECTOMY;  Surgeon: Eustace Moore;  Location: [place] [facility]UR[facility];  Service: Neurosurgery;  Laterality: Bilateral;  Bilateral  , Lumbar Three-Four, Lumbar Four-Five Decompressive Laminectomy Rm # 32   LUNG SURGERY     for empyema   SHOULDER ARTHROSCOPY Right 07/05/2013   Procedure: RIGHT SHOULDER ARTHROSCOPY WITH DEBRIDEMENT;  Surgeon: Mcarthur Rossetti, MD;  Location: [place];  Service: Orthopedics;  Laterality: Right;   TENDON TRANSFER Left 01/26/2014   Procedure: LEFT THUMB TRAPEZIECTOMY KNOTTED TENDON INTRAPOSITION TRANSFER OF ABDUCTOR POLLICIS LONGUS TO [place];  Surgeon: Cammie Sickle, MD;  Location: [place];  Service: Orthopedics;  Laterality: Left;   THORACOTOMY  November 14 2010   rt   Patient Active Problem List   Diagnosis Date Noted   Protrusion of lumbar intervertebral disc 09/23/2022   History of lumbar laminectomy for spinal cord decompression 01/08/2022   Spinal stenosis of lumbar region 07/25/2021   Foraminal stenosis of cervical region 03/08/2021   Viral pericarditis    Alcohol use disorder, severe, dependence 12/06/2020   Chest pain 11/09/2020   Ascending aortic aneurysm    Benign essential HTN    Coronary artery calcification seen on CAT scan    Psychoactive substance-induced psychosis    Rib pain 07/01/2017   Closed fracture of one rib of left side 07/01/2017   Chronic right shoulder pain 05/04/2017   Osteoarthritis of right hip 12/02/2016   Lumbar radiculopathy 04/17/2014   Chronic low back pain 08/16/2013   Lumbar post-laminectomy syndrome 08/16/2013   Lumbosacral radiculitis 08/16/2013   Impingement syndrome of right shoulder 07/05/2013   Empyema lung    Abscess of lung(513.0)    Bipolar depression ([place])    ADD (attention deficit disorder)    Hepatitis C    Hip pain 05/02/2011   History of total hip arthroplasty 04/23/2011   Lung  mass 10/22/2010   Pleural effusion 10/15/2010    PCP: Evangeline Gula, NP   REFERRING PROVIDER: Callie Fielding, MD   REFERRING DIAG: M54.41 (ICD-10-CM) - Acute midline low back pain with right-sided sciatica   Rationale for Evaluation and Treatment: Rehabilitation  THERAPY DIAG:  Other low back pain  Muscle weakness (generalized)  Difficulty in walking, not elsewhere classified  ONSET DATE: 3 month onset of back pain  SUBJECTIVE:                                                                                                                                                                                           SUBJECTIVE STATEMENT: He says 3 months ago all the sudden he started having sever back pain  with unknown cause with pain down his Rt leg. He does still have some N/T in Rt leg but his has improved since injection.  He has tried injections and that MD feels he will need surgery, L1-L3 Laminectomies, L1-2 Disectomy.   PERTINENT HISTORY:  MRI showing L1/2 disc herniation, L1/2 central stenosis, L2/3 central lateral recess stenosis. Previous laminectomy/disectomy 2012 L3-5, shoulder arthroscopy 2014, bilat hip replacement/resurfacing  PAIN:  Are you having pain? Yes: NPRS scale: 8/10 Pain location: low back and down Rt leg/hip  Pain description: ache, N/T Aggravating factors: standing, walking, moving Relieving factors: sitting  PRECAUTIONS: disc herniation  WEIGHT BEARING RESTRICTIONS: No  FALLS:  Has patient fallen in last 6 months? No   PLOF: Independent  PATIENT GOALS: play basketball again  NEXT MD VISIT:   OBJECTIVE:   DIAGNOSTIC FINDINGS:  MRI of the lumbar spine from 10/01/2022 was independently reviewed and interpreted by Dr Laurance Flatten, "showing L1/2 disc herniation with caudal migration on the right side.  There is central stenosis at that level as well.  There is central and lateral recess stenosis at L2/3.  Mild foraminal stenosis bilaterally at L1/2 and L2/3.  Prior hemilaminotomy at L3/4 and L4/5. "  PATIENT SURVEYS:  Eval: FOTO 36% functional, goal is 56%  SCREENING FOR RED FLAGS: Bowel or bladder incontinence: No Cauda equina syndrome: No   COGNITION: Overall cognitive status: Within functional limits for tasks assessed     SENSATION: WFL  MUSCLE LENGTH: Eval Hamstrings: tight bilat Thomas test: tight bilat, Rt is more tight  POSTURE:   PALPATION:   LUMBAR ROM:   AROM eval  Flexion WNL*  Extension 50% Feels good  Right lateral flexion 50%*  Left lateral flexion 50%*  Right rotation 50%*  Left rotation 50%*   (Blank rows = not tested) * denotes pain  LOWER EXTREMITY ROM:  Right eval Left eval  Hip flexion    Hip extension     Hip abduction    Hip adduction    Hip internal rotation    Hip external rotation    Knee flexion    Knee extension    Ankle dorsiflexion    Ankle plantarflexion    Ankle inversion    Ankle eversion     (Blank rows = not tested)  LOWER EXTREMITY MMT:    MMT Right eval Left eval  Hip flexion 4 4  Hip extension    Hip abduction 4 4  Hip adduction    Hip internal rotation    Hip external rotation    Knee flexion 5 5  Knee extension 5 5  Ankle dorsiflexion 5 5  Ankle plantarflexion    Ankle inversion    Ankle eversion     (Blank rows = not tested)  LUMBAR SPECIAL TESTS:  Slump test: Negative but had recent injection  FUNCTIONAL TESTS:  Eval Pain noted with sit to stand, needed UE support to push up  GAIT: 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    TODAY'S TREATMENT:  Eval HEP creation and review with demonstration and trial set preformed, see below for details Mechanical lumbar traction 85-70# X 10 min    PATIENT EDUCATION: Education details: HEP, PT plan of care Person educated: Patient Education method: Explanation, Demonstration, Verbal cues, and Handouts Education comprehension: verbalized understanding and needs further education   HOME EXERCISE PROGRAM: Access Code: [email or web address] URL: [email or web address] Date: 10/27/2022 Prepared by: Elsie Ra  Exercises - Standing Lumbar Extension  - 5 x daily - 6 x weekly - 1-2 sets - 10 reps - 5 hold - Supine Piriformis Stretch with Foot on Ground  - 2 x daily - 6 x weekly - 1 sets - 3 reps - 30 hold - Supine Bridge  - 2 x daily - 6 x weekly - 1-2 sets - 10 reps - 5 hold - Standing Hip Flexion with Resistance Loop  - 2 x daily - 6 x weekly - 2 sets - 10-15 reps - Hip Abduction with Resistance Loop  - 2 x daily - 6 x weekly - 2 sets - 10-15 reps - Modified Thomas Stretch  - 2 x daily - 6 x weekly - 3-5 sets - 20-30 hold  ASSESSMENT:  CLINICAL IMPRESSION:  Patient referred to PT for low back pain with Rt radiculopathy L1/2 disc herniation, L1/2 central stenosis, L2/3 central lateral recess stenosis. Surgery has been recommended to him but he will try conservative treatment first. Patient will benefit from skilled PT to address below impairments, limitations and improve overall function.  OBJECTIVE IMPAIRMENTS: decreased activity tolerance, difficulty walking, decreased balance, decreased endurance, decreased mobility, decreased ROM, decreased strength, impaired flexibility, impaired UE/LE use, postural dysfunction, and pain.  ACTIVITY LIMITATIONS: bending, lifting, carry, locomotion, cleaning, community activity, driving, and or occupation  PERSONAL FACTORS: Previous laminectomy/disectomy 2012 L3-5, shoulder arthroscopy 2014, bilat hip replacement/resurfacing are also affecting patient's functional outcome.  REHAB POTENTIAL: Fair    CLINICAL DECISION MAKING: Stable/uncomplicated  EVALUATION COMPLEXITY: Low    GOALS: Short term PT Goals Target date: 11/24/2022   Pt will be I and compliant with HEP. Baseline:  Goal status: New Pt will decrease pain by 25% overall Baseline:8 Goal status: New  Long term PT goals Target date:12/08/2022   Pt will improve lumbar ROM to [facility] without complaints to improve functional mobility Baseline: Goal status: New Pt will  improve bilat hip strength to at least 4+/5 MMT to improve functional strength Baseline: Goal status: New Pt will improve FOTO to at least 56% functional to show improved function Baseline:8 Goal status: New Pt will reduce pain by overall 50% overall with usual activity Baseline: Goal status: New Pt will be able to ambulate community distances at least 1000 ft WNL gait pattern without complaints Baseline: Goal status: New  PLAN: PT FREQUENCY: 1-3 times per week   PT DURATION: 6-8 weeks  PLANNED INTERVENTIONS (unless contraindicated): aquatic PT, Canalith repositioning,  cryotherapy, Electrical stimulation, Iontophoresis with 4 mg/ml dexamethasome, Moist heat, traction, Ultrasound, gait training, Therapeutic exercise, balance training, neuromuscular re-education, patient/family education, prosthetic training, manual techniques, passive ROM, dry needling, taping, vasopnuematic device, vestibular, spinal manipulations, joint manipulations  PLAN FOR NEXT SESSION: how was traction, begin extension based program and assess respons.    Debbe Odea, PT,DPT 10/27/2022, 2:46 PM

## 2022-10-28 ENCOUNTER — Telehealth: Payer: Self-pay | Admitting: Behavioral Health

## 2022-10-28 ENCOUNTER — Telehealth: Payer: Self-pay | Admitting: Orthopedic Surgery

## 2022-10-28 MED ORDER — OXYCODONE HCL 5 MG PO TABS: 5.0000 mg | ORAL_TABLET | ORAL | 0 refills | Status: AC | PRN

## 2022-10-28 NOTE — Telephone Encounter (Signed)
I called and advised that this had been taken care of

## 2022-10-28 NOTE — Telephone Encounter (Signed)
Patient lvm at 10:14 stating that the savers card for his [employer] through [place] no longer works due to service disruption but is "working [employer] He would like to know if he could get another one. Ph: [phone]

## 2022-10-28 NOTE — Addendum Note (Signed)
Addended by: Ileene Rubens on: 10/28/2022 03:16 PM   Modules accepted: Orders

## 2022-10-28 NOTE — Telephone Encounter (Signed)
[facility] is stating [facility] till tomorrow [facility], patient is stating that medication is available at [facility] on [facility] and is asking if medication can be changed to that location instead? Please let patient know

## 2022-10-29 ENCOUNTER — Ambulatory Visit (INDEPENDENT_AMBULATORY_CARE_PROVIDER_SITE_OTHER): Payer: PPO | Admitting: Physical Therapy

## 2022-10-29 ENCOUNTER — Encounter: Payer: Self-pay | Admitting: Physical Therapy

## 2022-10-29 DIAGNOSIS — M6281 Muscle weakness (generalized): Secondary | ICD-10-CM | POA: Diagnosis not present

## 2022-10-29 DIAGNOSIS — M5459 Other low back pain: Secondary | ICD-10-CM

## 2022-10-29 DIAGNOSIS — R262 Difficulty in walking, not elsewhere classified: Secondary | ICD-10-CM | POA: Diagnosis not present

## 2022-10-29 NOTE — Therapy (Signed)
OUTPATIENT PHYSICAL THERAPY TREATMENT  Patient Name: RUARI RODINO MRN: [id number] DOB:[birth year], 67 y.o., male Today's Date: 10/29/2022  END OF SESSION:  PT End of Session - 10/29/22 1008     Visit Number 2    Number of Visits 10    Date for PT Re-Evaluation 12/08/22    Progress Note Due on Visit 10    PT Start Time 1015    PT Stop Time 1100    PT Time Calculation (min) 45 min    Activity Tolerance Patient tolerated treatment well    Behavior During Therapy [facility] for tasks assessed/performed             Past Medical History:  Diagnosis Date   Abscess of lung(513.0)    Right lower lobe   ADD (attention deficit disorder)    Anxiety    Arthritis    Ascending aortic aneurysm    30mm by Chest CTA 05/2020 and 67mm by echo 05/2020   Benign essential HTN    Bipolar depression    Coronary artery calcification seen on CAT scan    Empyema lung    Hemorrhoids    Hepatitis C    Pneumonia    last year   Prostate abscess    Viral pericarditis    after COVID 19 infection   Past Surgical History:  Procedure Laterality Date   ELBOW SURGERY  2005   Right   FOOT NEUROMA SURGERY  2006   Left   HIP RESURFACING     left hip at [facility] 01/2011   LUMBAR LAMINECTOMY/DECOMPRESSION MICRODISCECTOMY  07/24/2011   Procedure: LUMBAR LAMINECTOMY/DECOMPRESSION MICRODISCECTOMY;  Surgeon: Eustace Moore;  Location: [place] NEUR[facility] ORS;  Service: Neurosurgery;  Laterality: Bilateral;  Bilateral  , Lumbar Three-Four, Lumbar Four-Five Decompressive Laminectomy Rm # 32   LUNG SURGERY     for empyema   SHOULDER ARTHROSCOPY Right 07/05/2013   Procedure: RIGHT SHOULDER ARTHROSCOPY WITH DEBRIDEMENT;  Surgeon: Mcarthur Rossetti, MD;  Location: [place];  Service: Orthopedics;  Laterality: Right;   TENDON TRANSFER Left 01/26/2014   Procedure: LEFT THUMB TRAPEZIECTOMY KNOTTED TENDON INTRAPOSITION TRANSFER OF ABDUCTOR POLLICIS LONGUS TO [place];  Surgeon: Cammie Sickle, MD;  Location: [place];   Service: Orthopedics;  Laterality: Left;   THORACOTOMY  November 14 2010   rt   Patient Active Problem List   Diagnosis Date Noted   Protrusion of lumbar intervertebral disc 09/23/2022   History of lumbar laminectomy for spinal cord decompression 01/08/2022   Spinal stenosis of lumbar region 07/25/2021   Foraminal stenosis of cervical region 03/08/2021   Viral pericarditis    Alcohol use disorder, severe, dependence 12/06/2020   Chest pain 11/09/2020   Ascending aortic aneurysm    Benign essential HTN    Coronary artery calcification seen on CAT scan    Psychoactive substance-induced psychosis    Rib pain 07/01/2017   Closed fracture of one rib of left side 07/01/2017   Chronic right shoulder pain 05/04/2017   Osteoarthritis of right hip 12/02/2016   Lumbar radiculopathy 04/17/2014   Chronic low back pain 08/16/2013   Lumbar post-laminectomy syndrome 08/16/2013   Lumbosacral radiculitis 08/16/2013   Impingement syndrome of right shoulder 07/05/2013   Empyema lung    Abscess of lung(513.0)    Bipolar depression ([place])    ADD (attention deficit disorder)    Hepatitis C    Hip pain 05/02/2011   History of total hip arthroplasty 04/23/2011   Lung mass 10/22/2010  Pleural effusion 10/15/2010    PCP: Evangeline Gula, NP   REFERRING PROVIDER: Callie Fielding, MD   REFERRING DIAG: M54.41 (ICD-10-CM) - Acute midline low back pain with right-sided sciatica   Rationale for Evaluation and Treatment: Rehabilitation  THERAPY DIAG:  Other low back pain  Muscle weakness (generalized)  Difficulty in walking, not elsewhere classified  ONSET DATE: 3 month onset of back pain  SUBJECTIVE:                                                                                                                                                                                           SUBJECTIVE STATEMENT: He relays he was sore after the traction but then felt better overall. He raked leaves  because he felt good and may have pushed him self too much he reports  PERTINENT HISTORY:  MRI showing L1/2 disc herniation, L1/2 central stenosis, L2/3 central lateral recess stenosis. Previous laminectomy/disectomy 2012 L3-5, shoulder arthroscopy 2014, bilat hip replacement/resurfacing  PAIN:  Are you having pain? Yes: NPRS scale: 5/10 Pain location: low back and down Rt leg/hip  Pain description: ache, N/T Aggravating factors: standing, walking, moving Relieving factors: sitting  PRECAUTIONS: disc herniation  WEIGHT BEARING RESTRICTIONS: No  FALLS:  Has patient fallen in last 6 months? No   PLOF: Independent  PATIENT GOALS: play basketball again  NEXT MD VISIT:   OBJECTIVE:   DIAGNOSTIC FINDINGS:  MRI of the lumbar spine from 10/01/2022 was independently reviewed and interpreted by Dr Laurance Flatten, "showing L1/2 disc herniation with caudal migration on the right side.  There is central stenosis at that level as well.  There is central and lateral recess stenosis at L2/3.  Mild foraminal stenosis bilaterally at L1/2 and L2/3.  Prior hemilaminotomy at L3/4 and L4/5. "  PATIENT SURVEYS:  Eval: FOTO 36% functional, goal is 56%  SCREENING FOR RED FLAGS: Bowel or bladder incontinence: No Cauda equina syndrome: No   COGNITION: Overall cognitive status: Within functional limits for tasks assessed     SENSATION: WFL  MUSCLE LENGTH: Eval Hamstrings: tight bilat Thomas test: tight bilat, Rt is more tight  POSTURE:   PALPATION:   LUMBAR ROM:   AROM eval  Flexion WNL*  Extension 50% Feels good  Right lateral flexion 50%*  Left lateral flexion 50%*  Right rotation 50%*  Left rotation 50%*   (Blank rows = not tested) * denotes pain  LOWER EXTREMITY ROM:      Right eval Left eval  Hip flexion    Hip extension    Hip abduction    Hip adduction    Hip internal rotation  Hip external rotation    Knee flexion    Knee extension    Ankle dorsiflexion    Ankle  plantarflexion    Ankle inversion    Ankle eversion     (Blank rows = not tested)  LOWER EXTREMITY MMT:    MMT Right eval Left eval  Hip flexion 4 4  Hip extension    Hip abduction 4 4  Hip adduction    Hip internal rotation    Hip external rotation    Knee flexion 5 5  Knee extension 5 5  Ankle dorsiflexion 5 5  Ankle plantarflexion    Ankle inversion    Ankle eversion     (Blank rows = not tested)  LUMBAR SPECIAL TESTS:  Slump test: Negative but had recent injection  FUNCTIONAL TESTS:  Eval Pain noted with sit to stand, needed UE support to push up  GAIT: Eval Comments: independent community Ambulator but pain with prolonged walking    TODAY'S TREATMENT:  10/29/22 Recumbent bike L4-3 X 5 min Standing lumbar extensions 2X10 Standing rows and extensions, blue band X 20 each Standing hip flexion red X 15 bilat Standing hip abduction red X 15 bilat Standing hip flexion red X 15 bilat Supine bridges X 10, holding 5 sec Supine piriformis stretch 30 sec hold X 1 knee down, X 1 knee to opposite shoulder  Mechanical lumbar traction 85-70# X 15 min  Eval HEP creation and review with demonstration and trial set preformed, see below for details Mechanical lumbar traction 85-70# X 10 min    PATIENT EDUCATION: Education details: HEP, PT plan of care Person educated: Patient Education method: Explanation, Demonstration, Verbal cues, and Handouts Education comprehension: verbalized understanding and needs further education   HOME EXERCISE PROGRAM: Access Code: [email or web address] URL: [email or web address] Date: 10/27/2022 Prepared by: Elsie Ra  Exercises - Standing Lumbar Extension  - 5 x daily - 6 x weekly - 1-2 sets - 10 reps - 5 hold - Supine Piriformis Stretch with Foot on Ground  - 2 x daily - 6 x weekly - 1 sets - 3 reps - 30 hold - Supine Bridge  - 2 x daily - 6 x weekly - 1-2 sets - 10 reps - 5 hold - Standing Hip Flexion with Resistance Loop  -  2 x daily - 6 x weekly - 2 sets - 10-15 reps - Hip Abduction with Resistance Loop  - 2 x daily - 6 x weekly - 2 sets - 10-15 reps - Modified Thomas Stretch  - 2 x daily - 6 x weekly - 3-5 sets - 20-30 hold  ASSESSMENT:  CLINICAL IMPRESSION: I reviewed his HEP and he shows good understanding and return demonstration. He appeared to benefit from lumbar traction so we used this again today in his treatment.   OBJECTIVE IMPAIRMENTS: decreased activity tolerance, difficulty walking, decreased balance, decreased endurance, decreased mobility, decreased ROM, decreased strength, impaired flexibility, impaired UE/LE use, postural dysfunction, and pain.  ACTIVITY LIMITATIONS: bending, lifting, carry, locomotion, cleaning, community activity, driving, and or occupation  PERSONAL FACTORS: Previous laminectomy/disectomy 2012 L3-5, shoulder arthroscopy 2014, bilat hip replacement/resurfacing are also affecting patient's functional outcome.  REHAB POTENTIAL: Fair    CLINICAL DECISION MAKING: Stable/uncomplicated  EVALUATION COMPLEXITY: Low    GOALS: Short term PT Goals Target date: 11/24/2022   Pt will be I and compliant with HEP. Baseline:  Goal status: New Pt will decrease pain by 25% overall Baseline:8 Goal status: New  Long term PT goals Target date:12/08/2022  Pt will improve lumbar ROM to [facility] without complaints to improve functional mobility Baseline: Goal status: New Pt will improve bilat hip strength to at least 4+/5 MMT to improve functional strength Baseline: Goal status: New Pt will improve FOTO to at least 56% functional to show improved function Baseline:8 Goal status: New Pt will reduce pain by overall 50% overall with usual activity Baseline: Goal status: New Pt will be able to ambulate community distances at least 1000 ft WNL gait pattern without complaints Baseline: Goal status: New  PLAN: PT FREQUENCY: 1-3 times per week   PT DURATION: 6-8 weeks  PLANNED  INTERVENTIONS (unless contraindicated): aquatic PT, Canalith repositioning, cryotherapy, Electrical stimulation, Iontophoresis with 4 mg/ml dexamethasome, Moist heat, traction, Ultrasound, gait training, Therapeutic exercise, balance training, neuromuscular re-education, patient/family education, prosthetic training, manual techniques, passive ROM, dry needling, taping, vasopnuematic device, vestibular, spinal manipulations, joint manipulations  PLAN FOR NEXT SESSION:  traction and extension based program and assess response.    Debbe Odea, PT,DPT 10/29/2022, 10:09 AM

## 2022-10-29 NOTE — Telephone Encounter (Signed)
Will call the pharmacy and provide co-pay card info. Last week had tried to do the same and the card info was the same as the corrupted one and it wouldn't work.

## 2022-10-30 ENCOUNTER — Telehealth: Payer: Self-pay | Admitting: Orthopedic Surgery

## 2022-10-30 NOTE — Telephone Encounter (Signed)
Patient states he has been approved for surgery from his insurance, but his PCP advised to see cardiologist, pat was told he needed further test prior to surgery. He states he want to speak to Dr. Laurance Flatten , or [place].

## 2022-10-30 NOTE — Telephone Encounter (Signed)
Patient called back and said he missed a call, I looked at Telephone message and told the patient that he needed to be cleared by Cardiologist prior to surgery per Dr. Laurance Flatten.

## 2022-10-30 NOTE — Telephone Encounter (Signed)
I called and lmovm, I advised that getting clearance from his PCP and Cardiologist are all typical steps to getting surgery. With out thiese clearances we will not be able to do the surgery.

## 2022-10-31 ENCOUNTER — Ambulatory Visit (HOSPITAL_COMMUNITY): Payer: PPO

## 2022-10-31 NOTE — Telephone Encounter (Signed)
FYI----I called and spoke with patient, I advised that he has to get cardiac clearance, he states the he has an AA and I advised that clearance is a must he states that they are having him to get a stress test and CT Angio. He did state that his back is sore sore but doing better, and that PT is helping especially the traction, he is doing the exercises at home as well.

## 2022-11-04 ENCOUNTER — Ambulatory Visit (INDEPENDENT_AMBULATORY_CARE_PROVIDER_SITE_OTHER): Payer: PPO | Admitting: Physical Therapy

## 2022-11-04 ENCOUNTER — Encounter: Payer: Self-pay | Admitting: Physical Therapy

## 2022-11-04 ENCOUNTER — Telehealth: Payer: Self-pay | Admitting: Orthopedic Surgery

## 2022-11-04 DIAGNOSIS — M5459 Other low back pain: Secondary | ICD-10-CM | POA: Diagnosis not present

## 2022-11-04 DIAGNOSIS — M6281 Muscle weakness (generalized): Secondary | ICD-10-CM

## 2022-11-04 DIAGNOSIS — R262 Difficulty in walking, not elsewhere classified: Secondary | ICD-10-CM | POA: Diagnosis not present

## 2022-11-04 MED ORDER — OXYCODONE HCL 5 MG PO TABS: 5.0000 mg | ORAL_TABLET | ORAL | 0 refills | Status: AC | PRN

## 2022-11-04 NOTE — Telephone Encounter (Signed)
I left voicemail for patient advising. 

## 2022-11-04 NOTE — Telephone Encounter (Signed)
Please advise 

## 2022-11-04 NOTE — Telephone Encounter (Signed)
Pt called requesting pain medication. Please send medication to [facility] [address] Pt phone number is [phone].

## 2022-11-04 NOTE — Therapy (Signed)
OUTPATIENT PHYSICAL THERAPY TREATMENT  Patient Name: Paul Ray MRN: [id number] DOB:03/08/1956, 67 y.o., male Today's Date: 11/04/2022  END OF SESSION:  PT End of Session - 11/04/22 1058     Visit Number 3    Number of Visits 10    Date for PT Re-Evaluation 12/08/22    Progress Note Due on Visit 10    PT Start Time 1100    PT Stop Time 1145    PT Time Calculation (min) 45 min    Activity Tolerance Patient tolerated treatment well    Behavior During Therapy WFL for tasks assessed/performed             Past Medical History:  Diagnosis Date   Abscess of lung(513.0)    Right lower lobe   ADD (attention deficit disorder)    Anxiety    Arthritis    Ascending aortic aneurysm    20mm by Chest CTA 05/2020 and 74mm by echo 05/2020   Benign essential HTN    Bipolar depression    Coronary artery calcification seen on CAT scan    Empyema lung    Hemorrhoids    Hepatitis C    Pneumonia    last year   Prostate abscess    Viral pericarditis    after COVID 19 infection   Past Surgical History:  Procedure Laterality Date   ELBOW SURGERY  2005   Right   FOOT NEUROMA SURGERY  2006   Left   HIP RESURFACING     left hip at [facility] 01/2011   LUMBAR LAMINECTOMY/DECOMPRESSION MICRODISCECTOMY  07/24/2011   Procedure: LUMBAR LAMINECTOMY/DECOMPRESSION MICRODISCECTOMY;  Surgeon: Tia Alert;  Location: [facility];  Service: Neurosurgery;  Laterality: Bilateral;  Bilateral  , Lumbar Three-Four, Lumbar Four-Five Decompressive Laminectomy Rm # 32   LUNG SURGERY     for empyema   SHOULDER ARTHROSCOPY Right 07/05/2013   Procedure: RIGHT SHOULDER ARTHROSCOPY WITH DEBRIDEMENT;  Surgeon: Kathryne Hitch, MD;  Location: [facility] OR;  Service: Orthopedics;  Laterality: Right;   TENDON TRANSFER Left 01/26/2014   Procedure: LEFT THUMB TRAPEZIECTOMY KNOTTED TENDON INTRAPOSITION TRANSFER OF ABDUCTOR POLLICIS LONGUS TO THENARS;  Surgeon: Wyn Forster, MD;  Location: [facility];   Service: Orthopedics;  Laterality: Left;   THORACOTOMY  November 14 2010   rt   Patient Active Problem List   Diagnosis Date Noted   Protrusion of lumbar intervertebral disc 09/23/2022   History of lumbar laminectomy for spinal cord decompression 01/08/2022   Spinal stenosis of lumbar region 07/25/2021   Foraminal stenosis of cervical region 03/08/2021   Viral pericarditis    Alcohol use disorder, severe, dependence 12/06/2020   Chest pain 11/09/2020   Ascending aortic aneurysm    Benign essential HTN    Coronary artery calcification seen on CAT scan    Psychoactive substance-induced psychosis    Rib pain 07/01/2017   Closed fracture of one rib of left side 07/01/2017   Chronic right shoulder pain 05/04/2017   Osteoarthritis of right hip 12/02/2016   Lumbar radiculopathy 04/17/2014   Chronic low back pain 08/16/2013   Lumbar post-laminectomy syndrome 08/16/2013   Lumbosacral radiculitis 08/16/2013   Impingement syndrome of right shoulder 07/05/2013   Empyema lung    Abscess of lung(513.0)    Bipolar depression (HCC)    ADD (attention deficit disorder)    Hepatitis C    Hip pain 05/02/2011   History of total hip arthroplasty 04/23/2011   Lung mass 10/22/2010  Pleural effusion 10/15/2010    PCP: Alveria Apley, NP   REFERRING PROVIDER: London Sheer, MD   REFERRING DIAG: M54.41 (ICD-10-CM) - Acute midline low back pain with right-sided sciatica   Rationale for Evaluation and Treatment: Rehabilitation  THERAPY DIAG:  Other low back pain  Muscle weakness (generalized)  Difficulty in walking, not elsewhere classified  ONSET DATE: 3 month onset of back pain  SUBJECTIVE:                                                                                                                                                                                           SUBJECTIVE STATEMENT: He relays his pain is up today but he may have over did it with yardwork and lifting  heavy as he had work that had to get done. He does note less N/T overall down his leg  PERTINENT HISTORY:  MRI showing L1/2 disc herniation, L1/2 central stenosis, L2/3 central lateral recess stenosis. Previous laminectomy/disectomy 2012 L3-5, shoulder arthroscopy 2014, bilat hip replacement/resurfacing  PAIN:  Are you having pain? Yes: NPRS scale: 5/10 Pain location: low back and down Rt leg/hip  Pain description: ache, N/T Aggravating factors: standing, walking, moving Relieving factors: sitting  PRECAUTIONS: disc herniation  WEIGHT BEARING RESTRICTIONS: No  FALLS:  Has patient fallen in last 6 months? No   PLOF: Independent  PATIENT GOALS: play basketball again  NEXT MD VISIT:   OBJECTIVE:   DIAGNOSTIC FINDINGS:  MRI of the lumbar spine from 10/01/2022 was independently reviewed and interpreted by Dr Christell Constant, "showing L1/2 disc herniation with caudal migration on the right side.  There is central stenosis at that level as well.  There is central and lateral recess stenosis at L2/3.  Mild foraminal stenosis bilaterally at L1/2 and L2/3.  Prior hemilaminotomy at L3/4 and L4/5. "  PATIENT SURVEYS:  Eval: FOTO 36% functional, goal is 56%  SCREENING FOR RED FLAGS: Bowel or bladder incontinence: No Cauda equina syndrome: No   COGNITION: Overall cognitive status: Within functional limits for tasks assessed     SENSATION: WFL  MUSCLE LENGTH: Eval Hamstrings: tight bilat Thomas test: tight bilat, Rt is more tight  POSTURE:   PALPATION:   LUMBAR ROM:   AROM eval  Flexion WNL*  Extension 50% Feels good  Right lateral flexion 50%*  Left lateral flexion 50%*  Right rotation 50%*  Left rotation 50%*   (Blank rows = not tested) * denotes pain  LOWER EXTREMITY ROM:      Right eval Left eval  Hip flexion    Hip extension    Hip abduction    Hip adduction  Hip internal rotation    Hip external rotation    Knee flexion    Knee extension    Ankle  dorsiflexion    Ankle plantarflexion    Ankle inversion    Ankle eversion     (Blank rows = not tested)  LOWER EXTREMITY MMT:    MMT Right eval Left eval  Hip flexion 4 4  Hip extension    Hip abduction 4 4  Hip adduction    Hip internal rotation    Hip external rotation    Knee flexion 5 5  Knee extension 5 5  Ankle dorsiflexion 5 5  Ankle plantarflexion    Ankle inversion    Ankle eversion     (Blank rows = not tested)  LUMBAR SPECIAL TESTS:  Slump test: Negative but had recent injection  FUNCTIONAL TESTS:  Eval Pain noted with sit to stand, needed UE support to push up  GAIT: Eval Comments: independent community Ambulator but pain with prolonged walking    TODAY'S TREATMENT:  11/04/22 Recumbent bike L4-3 X 5 min Standing lumbar extensions 2X10 Standing rows and extensions, blue band X 20 each Standing anti-rotation green X 15 bilat Standing hip flexion red X 15 bilat Standing hip abduction red X 15 bilat Standing hip flexion red X 15 bilat Supine knee to chest stretch 3 X 30 sec bilat  Mechanical lumbar traction 90-70# X 15 min  10/29/22 Recumbent bike L4-3 X 5 min Standing lumbar extensions 2X10 Standing rows and extensions, blue band X 20 each Standing hip flexion red X 15 bilat Standing hip abduction red X 15 bilat Standing hip flexion red X 15 bilat Supine bridges X 10, holding 5 sec Supine piriformis stretch 30 sec hold X 1 knee down, X 1 knee to opposite shoulder  Mechanical lumbar traction 85-70# X 15 min  Eval HEP creation and review with demonstration and trial set preformed, see below for details Mechanical lumbar traction 85-70# X 10 min    PATIENT EDUCATION: Education details: HEP, PT plan of care Person educated: Patient Education method: Explanation, Demonstration, Verbal cues, and Handouts Education comprehension: verbalized understanding and needs further education   HOME EXERCISE PROGRAM: Access Code: [email or web address] URL:  [email or web address] Date: 10/27/2022 Prepared by: Ivery QualeBrian Shaleka Brines  Exercises - Standing Lumbar Extension  - 5 x daily - 6 x weekly - 1-2 sets - 10 reps - 5 hold - Supine Piriformis Stretch with Foot on Ground  - 2 x daily - 6 x weekly - 1 sets - 3 reps - 30 hold - Supine Bridge  - 2 x daily - 6 x weekly - 1-2 sets - 10 reps - 5 hold - Standing Hip Flexion with Resistance Loop  - 2 x daily - 6 x weekly - 2 sets - 10-15 reps - Hip Abduction with Resistance Loop  - 2 x daily - 6 x weekly - 2 sets - 10-15 reps - Modified Thomas Stretch  - 2 x daily - 6 x weekly - 3-5 sets - 20-30 hold  ASSESSMENT:  CLINICAL IMPRESSION: he had more overall pain today likely from increased activity but on a positive note his radicular symptoms may be improving with PT  OBJECTIVE IMPAIRMENTS: decreased activity tolerance, difficulty walking, decreased balance, decreased endurance, decreased mobility, decreased ROM, decreased strength, impaired flexibility, impaired UE/LE use, postural dysfunction, and pain.  ACTIVITY LIMITATIONS: bending, lifting, carry, locomotion, cleaning, community activity, driving, and or occupation  PERSONAL FACTORS: Previous laminectomy/disectomy 2012 L3-5, shoulder arthroscopy 2014, bilat hip  replacement/resurfacing are also affecting patient's functional outcome.  REHAB POTENTIAL: Fair    CLINICAL DECISION MAKING: Stable/uncomplicated  EVALUATION COMPLEXITY: Low    GOALS: Short term PT Goals Target date: 11/24/2022   Pt will be I and compliant with HEP. Baseline:  Goal status: ongoing 11/04/22 Pt will decrease pain by 25% overall Baseline:8 Goal status: ongoing 11/04/22  Long term PT goals Target date:12/08/2022   Pt will improve lumbar ROM to [facility] without complaints to improve functional mobility Baseline: Goal status: ongoing 11/04/22 Pt will improve bilat hip strength to at least 4+/5 MMT to improve functional strength Baseline: Goal status: ongoing  11/04/22 Pt will improve FOTO to at least 56% functional to show improved function Baseline:8 Goal status: ongoing 11/04/22 Pt will reduce pain by overall 50% overall with usual activity Baseline: Goal status: ongoing 11/04/22 Pt will be able to ambulate community distances at least 1000 ft WNL gait pattern without complaints Baseline: Goal status: ongoing 11/04/22  PLAN: PT FREQUENCY: 1-3 times per week   PT DURATION: 6-8 weeks  PLANNED INTERVENTIONS (unless contraindicated): aquatic PT, Canalith repositioning, cryotherapy, Electrical stimulation, Iontophoresis with 4 mg/ml dexamethasome, Moist heat, traction, Ultrasound, gait training, Therapeutic exercise, balance training, neuromuscular re-education, patient/family education, prosthetic training, manual techniques, passive ROM, dry needling, taping, vasopnuematic device, vestibular, spinal manipulations, joint manipulations  PLAN FOR NEXT SESSION:  continue traction and extension based program if helpful. Lumbar mobility and core strength    April Manson, PT,DPT 11/04/2022, 10:59 AM

## 2022-11-06 ENCOUNTER — Encounter: Payer: PPO | Admitting: Physical Therapy

## 2022-11-10 ENCOUNTER — Ambulatory Visit
Admission: RE | Admit: 2022-11-10 | Discharge: 2022-11-10 | Disposition: A | Discharge status: Home / Self Care | Payer: PPO | Source: Ambulatory Visit | Attending: Internal Medicine | Admitting: Internal Medicine

## 2022-11-10 ENCOUNTER — Telehealth: Payer: Self-pay | Admitting: Orthopedic Surgery

## 2022-11-10 DIAGNOSIS — I7121 Aneurysm of the ascending aorta, without rupture: Secondary | ICD-10-CM

## 2022-11-10 MED ORDER — IOPAMIDOL (ISOVUE-370) INJECTION 76%
75.0000 mL | Freq: Once | INTRAVENOUS | Status: AC | PRN
  Administered 2022-11-10: 75 mL via INTRAVENOUS

## 2022-11-10 MED ORDER — OXYCODONE HCL 5 MG PO TABS: 2.5000 mg | ORAL_TABLET | ORAL | 0 refills | Status: DC | PRN

## 2022-11-10 NOTE — Addendum Note (Signed)
Addended by: Willia Craze on: 11/10/2022 11:37 AM   Modules accepted: Orders

## 2022-11-10 NOTE — Telephone Encounter (Signed)
Patient requesting Oxycodone sent to [facility] please advise

## 2022-11-11 ENCOUNTER — Other Ambulatory Visit: Payer: Self-pay | Admitting: *Deleted

## 2022-11-11 ENCOUNTER — Encounter: Payer: Self-pay | Admitting: Physical Therapy

## 2022-11-11 ENCOUNTER — Ambulatory Visit (INDEPENDENT_AMBULATORY_CARE_PROVIDER_SITE_OTHER): Payer: PPO | Admitting: Physical Therapy

## 2022-11-11 DIAGNOSIS — M5459 Other low back pain: Secondary | ICD-10-CM | POA: Diagnosis not present

## 2022-11-11 DIAGNOSIS — M6281 Muscle weakness (generalized): Secondary | ICD-10-CM | POA: Diagnosis not present

## 2022-11-11 DIAGNOSIS — I7121 Aneurysm of the ascending aorta, without rupture: Secondary | ICD-10-CM

## 2022-11-11 DIAGNOSIS — R262 Difficulty in walking, not elsewhere classified: Secondary | ICD-10-CM

## 2022-11-11 DIAGNOSIS — Q231 Congenital insufficiency of aortic valve: Secondary | ICD-10-CM

## 2022-11-11 NOTE — Therapy (Signed)
OUTPATIENT PHYSICAL THERAPY TREATMENT  Patient Name: Paul Ray MRN: [id number] DOB:10-02-1955, 67 y.o., male Today's Date: 11/11/2022  END OF SESSION:  PT End of Session - 11/11/22 1116     Visit Number 4    Number of Visits 10    Date for PT Re-Evaluation 12/08/22    Progress Note Due on Visit 10    PT Start Time 1105    PT Stop Time 1145    PT Time Calculation (min) 40 min    Activity Tolerance Patient tolerated treatment well    Behavior During Therapy WFL for tasks assessed/performed              Past Medical History:  Diagnosis Date   Abscess of lung(513.0)    Right lower lobe   ADD (attention deficit disorder)    Anxiety    Arthritis    Ascending aortic aneurysm    47mm by Chest CTA 05/2020 and 48mm by echo 05/2020   Benign essential HTN    Bipolar depression    Coronary artery calcification seen on CAT scan    Empyema lung    Hemorrhoids    Hepatitis C    Pneumonia    last year   Prostate abscess    Viral pericarditis    after COVID 19 infection   Past Surgical History:  Procedure Laterality Date   ELBOW SURGERY  2005   Right   FOOT NEUROMA SURGERY  2006   Left   HIP RESURFACING     left hip at [facility] 01/2011   LUMBAR LAMINECTOMY/DECOMPRESSION MICRODISCECTOMY  07/24/2011   Procedure: LUMBAR LAMINECTOMY/DECOMPRESSION MICRODISCECTOMY;  Surgeon: Tia Alert;  Location: [facility];  Service: Neurosurgery;  Laterality: Bilateral;  Bilateral  , Lumbar Three-Four, Lumbar Four-Five Decompressive Laminectomy Rm # 32   LUNG SURGERY     for empyema   SHOULDER ARTHROSCOPY Right 07/05/2013   Procedure: RIGHT SHOULDER ARTHROSCOPY WITH DEBRIDEMENT;  Surgeon: Kathryne Hitch, MD;  Location: [facility] OR;  Service: Orthopedics;  Laterality: Right;   TENDON TRANSFER Left 01/26/2014   Procedure: LEFT THUMB TRAPEZIECTOMY KNOTTED TENDON INTRAPOSITION TRANSFER OF ABDUCTOR POLLICIS LONGUS TO THENARS;  Surgeon: Wyn Forster, MD;  Location: [facility];  Service: Orthopedics;  Laterality: Left;   THORACOTOMY  November 14 2010   rt   Patient Active Problem List   Diagnosis Date Noted   Protrusion of lumbar intervertebral disc 09/23/2022   History of lumbar laminectomy for spinal cord decompression 01/08/2022   Spinal stenosis of lumbar region 07/25/2021   Foraminal stenosis of cervical region 03/08/2021   Viral pericarditis    Alcohol use disorder, severe, dependence 12/06/2020   Chest pain 11/09/2020   Ascending aortic aneurysm    Benign essential HTN    Coronary artery calcification seen on CAT scan    Psychoactive substance-induced psychosis    Rib pain 07/01/2017   Closed fracture of one rib of left side 07/01/2017   Chronic right shoulder pain 05/04/2017   Osteoarthritis of right hip 12/02/2016   Lumbar radiculopathy 04/17/2014   Chronic low back pain 08/16/2013   Lumbar post-laminectomy syndrome 08/16/2013   Lumbosacral radiculitis 08/16/2013   Impingement syndrome of right shoulder 07/05/2013   Empyema lung    Abscess of lung(513.0)    Bipolar depression (HCC)    ADD (attention deficit disorder)    Hepatitis C    Hip pain 05/02/2011   History of total hip arthroplasty 04/23/2011   Lung mass  10/22/2010   Pleural effusion 10/15/2010    PCP: Alveria Apley, NP   REFERRING PROVIDER: London Sheer, MD   REFERRING DIAG: M54.41 (ICD-10-CM) - Acute midline low back pain with right-sided sciatica   Rationale for Evaluation and Treatment: Rehabilitation  THERAPY DIAG:  Other low back pain  Muscle weakness (generalized)  Difficulty in walking, not elsewhere classified  ONSET DATE: 3 month onset of back pain  SUBJECTIVE:                                                                                                                                                                                           SUBJECTIVE STATEMENT: He relays his pain is a little worse in the center of his back but it is not  running down his leg.   PERTINENT HISTORY:  MRI showing L1/2 disc herniation, L1/2 central stenosis, L2/3 central lateral recess stenosis. Previous laminectomy/disectomy 2012 L3-5, shoulder arthroscopy 2014, bilat hip replacement/resurfacing  PAIN:  Are you having pain? Yes: NPRS scale: 7/10 Pain location: low back in center today  Pain description: ache, N/T Aggravating factors: standing, walking, moving Relieving factors: sitting  PRECAUTIONS: disc herniation  WEIGHT BEARING RESTRICTIONS: No  FALLS:  Has patient fallen in last 6 months? No   PLOF: Independent  PATIENT GOALS: play basketball again  NEXT MD VISIT:   OBJECTIVE:   DIAGNOSTIC FINDINGS:  MRI of the lumbar spine from 10/01/2022 was independently reviewed and interpreted by Dr Christell Constant, "showing L1/2 disc herniation with caudal migration on the right side.  There is central stenosis at that level as well.  There is central and lateral recess stenosis at L2/3.  Mild foraminal stenosis bilaterally at L1/2 and L2/3.  Prior hemilaminotomy at L3/4 and L4/5. "  PATIENT SURVEYS:  Eval: FOTO 36% functional, goal is 56%  SCREENING FOR RED FLAGS: Bowel or bladder incontinence: No Cauda equina syndrome: No   COGNITION: Overall cognitive status: Within functional limits for tasks assessed     SENSATION: WFL  MUSCLE LENGTH: Eval Hamstrings: tight bilat Thomas test: tight bilat, Rt is more tight  POSTURE:   PALPATION:   LUMBAR ROM:   AROM eval  Flexion WNL*  Extension 50% Feels good  Right lateral flexion 50%*  Left lateral flexion 50%*  Right rotation 50%*  Left rotation 50%*   (Blank rows = not tested) * denotes pain  LOWER EXTREMITY ROM:      Right eval Left eval  Hip flexion    Hip extension    Hip abduction    Hip adduction    Hip internal rotation    Hip external rotation  Knee flexion    Knee extension    Ankle dorsiflexion    Ankle plantarflexion    Ankle inversion    Ankle  eversion     (Blank rows = not tested)  LOWER EXTREMITY MMT:    MMT Right eval Left eval  Hip flexion 4 4  Hip extension    Hip abduction 4 4  Hip adduction    Hip internal rotation    Hip external rotation    Knee flexion 5 5  Knee extension 5 5  Ankle dorsiflexion 5 5  Ankle plantarflexion    Ankle inversion    Ankle eversion     (Blank rows = not tested)  LUMBAR SPECIAL TESTS:  Slump test: Negative but had recent injection  FUNCTIONAL TESTS:  Eval Pain noted with sit to stand, needed UE support to push up  GAIT: Eval Comments: independent community Ambulator but pain with prolonged walking    TODAY'S TREATMENT:  11/11/22 Recumbent bike L4-3 X 5 min Standing lumbar extensions 2X10 Standing rows and extensions, blue band X 20 each Standing anti-rotation green X 15 bilat Standing hip flexion red X 15 bilat Standing hip abduction red X 15 bilat Standing hip flexion red X 15 bilat Supine knee to chest stretch 3 X 30 sec bilat  Mechanical lumbar traction 90-70# X 15 min  11/04/22 Recumbent bike L4-3 X 5 min Standing lumbar extensions 2X10 Standing rows and extensions, blue band X 20 each Standing anti-rotation green X 15 bilat Standing hip flexion red X 15 bilat Standing hip abduction red X 15 bilat Standing hip flexion red X 15 bilat Supine knee to chest stretch 3 X 30 sec bilat  Mechanical lumbar traction 90-70# X 15 min    PATIENT EDUCATION: Education details: HEP, PT plan of care Person educated: Patient Education method: Explanation, Demonstration, Verbal cues, and Handouts Education comprehension: verbalized understanding and needs further education   HOME EXERCISE PROGRAM: Access Code: [email or web address] URL: [email or web address] Date: 10/27/2022 Prepared by: Ivery Quale  Exercises - Standing Lumbar Extension  - 5 x daily - 6 x weekly - 1-2 sets - 10 reps - 5 hold - Supine Piriformis Stretch with Foot on Ground  - 2 x daily - 6 x  weekly - 1 sets - 3 reps - 30 hold - Supine Bridge  - 2 x daily - 6 x weekly - 1-2 sets - 10 reps - 5 hold - Standing Hip Flexion with Resistance Loop  - 2 x daily - 6 x weekly - 2 sets - 10-15 reps - Hip Abduction with Resistance Loop  - 2 x daily - 6 x weekly - 2 sets - 10-15 reps - Modified Thomas Stretch  - 2 x daily - 6 x weekly - 3-5 sets - 20-30 hold  ASSESSMENT:  CLINICAL IMPRESSION: his pain is more in the center of his back so this is hopefully a sign of some centralization of his radicular symptoms. I did offer DN as an intervention but he is unsure of this at this time and wanted to hold off on this but did want to continue with mechanical traction.  OBJECTIVE IMPAIRMENTS: decreased activity tolerance, difficulty walking, decreased balance, decreased endurance, decreased mobility, decreased ROM, decreased strength, impaired flexibility, impaired UE/LE use, postural dysfunction, and pain.  ACTIVITY LIMITATIONS: bending, lifting, carry, locomotion, cleaning, community activity, driving, and or occupation  PERSONAL FACTORS: Previous laminectomy/disectomy 2012 L3-5, shoulder arthroscopy 2014, bilat hip replacement/resurfacing are also affecting patient's functional outcome.  REHAB POTENTIAL: Fair  CLINICAL DECISION MAKING: Stable/uncomplicated  EVALUATION COMPLEXITY: Low    GOALS: Short term PT Goals Target date: 11/24/2022   Pt will be I and compliant with HEP. Baseline:  Goal status: ongoing 11/04/22 Pt will decrease pain by 25% overall Baseline:8 Goal status: ongoing 11/04/22  Long term PT goals Target date:12/08/2022   Pt will improve lumbar ROM to [facility] without complaints to improve functional mobility Baseline: Goal status: ongoing 11/04/22 Pt will improve bilat hip strength to at least 4+/5 MMT to improve functional strength Baseline: Goal status: ongoing 11/04/22 Pt will improve FOTO to at least 56% functional to show improved function Baseline:8 Goal status:  ongoing 11/04/22 Pt will reduce pain by overall 50% overall with usual activity Baseline: Goal status: ongoing 11/04/22 Pt will be able to ambulate community distances at least 1000 ft WNL gait pattern without complaints Baseline: Goal status: ongoing 11/04/22  PLAN: PT FREQUENCY: 1-3 times per week   PT DURATION: 6-8 weeks  PLANNED INTERVENTIONS (unless contraindicated): aquatic PT, Canalith repositioning, cryotherapy, Electrical stimulation, Iontophoresis with 4 mg/ml dexamethasome, Moist heat, traction, Ultrasound, gait training, Therapeutic exercise, balance training, neuromuscular re-education, patient/family education, prosthetic training, manual techniques, passive ROM, dry needling, taping, vasopnuematic device, vestibular, spinal manipulations, joint manipulations  PLAN FOR NEXT SESSION:  continue traction and extension based program with Lumbar mobility and core strength progressions as able. Potentially DN if he wants to try it.    April Manson, PT,DPT 11/11/2022, 11:40 AM

## 2022-11-12 ENCOUNTER — Telehealth: Payer: Self-pay | Admitting: Internal Medicine

## 2022-11-12 NOTE — Telephone Encounter (Signed)
Order has been placed.

## 2022-11-12 NOTE — Telephone Encounter (Signed)
Called patient back about his message. Informed patient of his CT results per Dr. Lynnette Caffey, CT scan for aneurysm in 1 year and CTS referral for thoracic aortic aneurysm and bicuspid valve. Patient was concerned and had multiple questions, and would like to speak with Dr. Lynnette Caffey about results.  Patient stated he just wanted to get cleared for back surgery and does not understand why he needs to see heart surgeon. Patient stated he saw Dr. Tyrone Sage in the past and he told patient that he needed to follow up with general cardiologist. Informed patient that Dr. Tyrone Sage has retired and it would be good to establish with a new surgeon in case he needs surgery in the future. Patient stated his aortic aneurysm has decreased from 4.9 to 4.6 and does not see a need for referral. Patient wanted to know the normal size range and other details that would need to be discussed with a provider. Tried to answer questions to the best of my knowledge, but patient would like to talk to doctor.

## 2022-11-12 NOTE — Telephone Encounter (Signed)
Patient is requesting a call back to discuss CT results. 

## 2022-11-12 NOTE — Telephone Encounter (Signed)
Spoke with patient about CT scan results.  He pointed out that the CT results indicate that the aneurysm has decreased in size.  I measured the aneurysm directly and measured around 4.8 cm.  This point in time the patient would like to defer CT surgery referral.  Will obtain a CT scan in 1 year.  Additionally I spoke to the patient about the stress test test that I had ordered for preoperative clearance for low back surgery.  The patient states he can do 4 METS of activity without any issues.  He no longer has immediate plans to undergo back surgery at this time.  He will let us know when this is scheduled.

## 2022-11-13 ENCOUNTER — Ambulatory Visit (INDEPENDENT_AMBULATORY_CARE_PROVIDER_SITE_OTHER): Payer: PPO | Admitting: Physical Therapy

## 2022-11-13 ENCOUNTER — Encounter: Payer: Self-pay | Admitting: Physical Therapy

## 2022-11-13 DIAGNOSIS — R262 Difficulty in walking, not elsewhere classified: Secondary | ICD-10-CM

## 2022-11-13 DIAGNOSIS — M6281 Muscle weakness (generalized): Secondary | ICD-10-CM | POA: Diagnosis not present

## 2022-11-13 DIAGNOSIS — M5459 Other low back pain: Secondary | ICD-10-CM

## 2022-11-13 NOTE — Therapy (Signed)
OUTPATIENT PHYSICAL THERAPY TREATMENT  Patient Name: Paul Ray MRN: [id number] DOB:1956/01/31, 67 y.o., male Today's Date: 11/13/2022  END OF SESSION:  PT End of Session - 11/13/22 1109     Visit Number 5    Number of Visits 10    Date for PT Re-Evaluation 12/08/22    Progress Note Due on Visit 10    PT Start Time 1016    PT Stop Time 1110    PT Time Calculation (min) 54 min    Activity Tolerance Patient tolerated treatment well    Behavior During Therapy [facility] for tasks assessed/performed               Past Medical History:  Diagnosis Date   Abscess of lung(513.0)    Right lower lobe   ADD (attention deficit disorder)    Anxiety    Arthritis    Ascending aortic aneurysm    47mm by Chest CTA 05/2020 and 48mm by echo 05/2020   Benign essential HTN    Bipolar depression    Coronary artery calcification seen on CAT scan    Empyema lung    Hemorrhoids    Hepatitis C    Pneumonia    last year   Prostate abscess    Viral pericarditis    after COVID 19 infection   Past Surgical History:  Procedure Laterality Date   ELBOW SURGERY  2005   Right   FOOT NEUROMA SURGERY  2006   Left   HIP RESURFACING     left hip at [facility] 01/2011   LUMBAR LAMINECTOMY/DECOMPRESSION MICRODISCECTOMY  07/24/2011   Procedure: LUMBAR LAMINECTOMY/DECOMPRESSION MICRODISCECTOMY;  Surgeon: Tia Alert;  Location: [facility];  Service: Neurosurgery;  Laterality: Bilateral;  Bilateral  , Lumbar Three-Four, Lumbar Four-Five Decompressive Laminectomy Rm # 32   LUNG SURGERY     for empyema   SHOULDER ARTHROSCOPY Right 07/05/2013   Procedure: RIGHT SHOULDER ARTHROSCOPY WITH DEBRIDEMENT;  Surgeon: Kathryne Hitch, MD;  Location: [facility] OR;  Service: Orthopedics;  Laterality: Right;   TENDON TRANSFER Left 01/26/2014   Procedure: LEFT THUMB TRAPEZIECTOMY KNOTTED TENDON INTRAPOSITION TRANSFER OF ABDUCTOR POLLICIS LONGUS TO THENARS;  Surgeon: Wyn Forster, MD;  Location: [facility];  Service: Orthopedics;  Laterality: Left;   THORACOTOMY  November 14 2010   rt   Patient Active Problem List   Diagnosis Date Noted   Protrusion of lumbar intervertebral disc 09/23/2022   History of lumbar laminectomy for spinal cord decompression 01/08/2022   Spinal stenosis of lumbar region 07/25/2021   Foraminal stenosis of cervical region 03/08/2021   Viral pericarditis    Alcohol use disorder, severe, dependence 12/06/2020   Chest pain 11/09/2020   Ascending aortic aneurysm    Benign essential HTN    Coronary artery calcification seen on CAT scan    Psychoactive substance-induced psychosis    Rib pain 07/01/2017   Closed fracture of one rib of left side 07/01/2017   Chronic right shoulder pain 05/04/2017   Osteoarthritis of right hip 12/02/2016   Lumbar radiculopathy 04/17/2014   Chronic low back pain 08/16/2013   Lumbar post-laminectomy syndrome 08/16/2013   Lumbosacral radiculitis 08/16/2013   Impingement syndrome of right shoulder 07/05/2013   Empyema lung    Abscess of lung(513.0)    Bipolar depression (HCC)    ADD (attention deficit disorder)    Hepatitis C    Hip pain 05/02/2011   History of total hip arthroplasty 04/23/2011   Lung  mass 10/22/2010   Pleural effusion 10/15/2010    PCP: Alveria Apley, NP   REFERRING PROVIDER: London Sheer, MD   REFERRING DIAG: M54.41 (ICD-10-CM) - Acute midline low back pain with right-sided sciatica   Rationale for Evaluation and Treatment: Rehabilitation  THERAPY DIAG:  Other low back pain  Muscle weakness (generalized)  Difficulty in walking, not elsewhere classified  ONSET DATE: 3 month onset of back pain  SUBJECTIVE:                                                                                                                                                                                           SUBJECTIVE STATEMENT: He relays still about 7/10 pain in his back today, he is open to trying DN  out  PERTINENT HISTORY:  MRI showing L1/2 disc herniation, L1/2 central stenosis, L2/3 central lateral recess stenosis. Previous laminectomy/disectomy 2012 L3-5, shoulder arthroscopy 2014, bilat hip replacement/resurfacing  PAIN:  Are you having pain? Yes: NPRS scale: 7/10 Pain location: low back in center today  Pain description: ache, N/T Aggravating factors: standing, walking, moving Relieving factors: sitting  PRECAUTIONS: disc herniation  WEIGHT BEARING RESTRICTIONS: No  FALLS:  Has patient fallen in last 6 months? No   PLOF: Independent  PATIENT GOALS: play basketball again  NEXT MD VISIT:   OBJECTIVE:   DIAGNOSTIC FINDINGS:  MRI of the lumbar spine from 10/01/2022 was independently reviewed and interpreted by Dr Christell Constant, "showing L1/2 disc herniation with caudal migration on the right side.  There is central stenosis at that level as well.  There is central and lateral recess stenosis at L2/3.  Mild foraminal stenosis bilaterally at L1/2 and L2/3.  Prior hemilaminotomy at L3/4 and L4/5. "  PATIENT SURVEYS:  Eval: FOTO 36% functional, goal is 56%  SCREENING FOR RED FLAGS: Bowel or bladder incontinence: No Cauda equina syndrome: No   COGNITION: Overall cognitive status: Within functional limits for tasks assessed     SENSATION: WFL  MUSCLE LENGTH: Eval Hamstrings: tight bilat Thomas test: tight bilat, Rt is more tight  POSTURE:   PALPATION:   LUMBAR ROM:   AROM eval  Flexion WNL*  Extension 50% Feels good  Right lateral flexion 50%*  Left lateral flexion 50%*  Right rotation 50%*  Left rotation 50%*   (Blank rows = not tested) * denotes pain  LOWER EXTREMITY ROM:      Right eval Left eval  Hip flexion    Hip extension    Hip abduction    Hip adduction    Hip internal rotation    Hip external rotation    Knee flexion  Knee extension    Ankle dorsiflexion    Ankle plantarflexion    Ankle inversion    Ankle eversion     (Blank rows  = not tested)  LOWER EXTREMITY MMT:    MMT Right eval Left eval  Hip flexion 4 4  Hip extension    Hip abduction 4 4  Hip adduction    Hip internal rotation    Hip external rotation    Knee flexion 5 5  Knee extension 5 5  Ankle dorsiflexion 5 5  Ankle plantarflexion    Ankle inversion    Ankle eversion     (Blank rows = not tested)  LUMBAR SPECIAL TESTS:  Slump test: Negative but had recent injection  FUNCTIONAL TESTS:  Eval Pain noted with sit to stand, needed UE support to push up  GAIT: Eval Comments: independent community Ambulator but pain with prolonged walking    TODAY'S TREATMENT:  11/13/22 Recumbent bike L4 X 5 min Standing lumbar extensions X10 at wall Standing rows and extensions, blue band X 20 each Seated lumbar stretch pball roll outs 5 sec X 15  Mechanical lumbar traction 90-70# X 15 min  Manual therapy for skilled palpation and Trigger Point Dry-Needling  Treatment instructions: Expect mild to moderate muscle soreness. Patient Consent Given: Yes Education handout provided: verbally provided Muscles treated: Cervical-and lumbar paraspinals/multifidi,  Treatment response/outcome: good overall tolerance,twitch response noted    11/11/22 Recumbent bike L4 X 5 min Standing lumbar extensions 2X10 Standing rows and extensions, blue band X 20 each Standing anti-rotation green X 15 bilat Standing hip flexion red X 15 bilat Standing hip abduction red X 15 bilat Standing hip flexion red X 15 bilat Supine knee to chest stretch 3 X 30 sec bilat  Mechanical lumbar traction 90-70# X 15 min  11/04/22 Recumbent bike L4-3 X 5 min Standing lumbar extensions 2X10 Standing rows and extensions, blue band X 20 each Standing anti-rotation green X 15 bilat Standing hip flexion red X 15 bilat Standing hip abduction red X 15 bilat Standing hip flexion red X 15 bilat Supine knee to chest stretch 3 X 30 sec bilat  Mechanical lumbar traction 90-70# X 15  min    PATIENT EDUCATION: Education details: HEP, PT plan of care Person educated: Patient Education method: Explanation, Demonstration, Verbal cues, and Handouts Education comprehension: verbalized understanding and needs further education   HOME EXERCISE PROGRAM: Access Code: [email or web address] URL: [email or web address] Date: 10/27/2022 Prepared by: Ivery Quale  Exercises - Standing Lumbar Extension  - 5 x daily - 6 x weekly - 1-2 sets - 10 reps - 5 hold - Supine Piriformis Stretch with Foot on Ground  - 2 x daily - 6 x weekly - 1 sets - 3 reps - 30 hold - Supine Bridge  - 2 x daily - 6 x weekly - 1-2 sets - 10 reps - 5 hold - Standing Hip Flexion with Resistance Loop  - 2 x daily - 6 x weekly - 2 sets - 10-15 reps - Hip Abduction with Resistance Loop  - 2 x daily - 6 x weekly - 2 sets - 10-15 reps - Modified Thomas Stretch  - 2 x daily - 6 x weekly - 3-5 sets - 20-30 hold  ASSESSMENT:  CLINICAL IMPRESSION: We continued with lumbar traction and exercises today and added in DN to today to his back and neck to see if this will help reduce his pain any further. He had a lot of muscle tension noted with  DN.   OBJECTIVE IMPAIRMENTS: decreased activity tolerance, difficulty walking, decreased balance, decreased endurance, decreased mobility, decreased ROM, decreased strength, impaired flexibility, impaired UE/LE use, postural dysfunction, and pain.  ACTIVITY LIMITATIONS: bending, lifting, carry, locomotion, cleaning, community activity, driving, and or occupation  PERSONAL FACTORS: Previous laminectomy/disectomy 2012 L3-5, shoulder arthroscopy 2014, bilat hip replacement/resurfacing are also affecting patient's functional outcome.  REHAB POTENTIAL: Fair    CLINICAL DECISION MAKING: Stable/uncomplicated  EVALUATION COMPLEXITY: Low    GOALS: Short term PT Goals Target date: 11/24/2022   Pt will be I and compliant with HEP. Baseline:  Goal status: ongoing 11/04/22 Pt  will decrease pain by 25% overall Baseline:8 Goal status: ongoing 11/04/22  Long term PT goals Target date:12/08/2022   Pt will improve lumbar ROM to [facility] without complaints to improve functional mobility Baseline: Goal status: ongoing 11/04/22 Pt will improve bilat hip strength to at least 4+/5 MMT to improve functional strength Baseline: Goal status: ongoing 11/04/22 Pt will improve FOTO to at least 56% functional to show improved function Baseline:8 Goal status: ongoing 11/04/22 Pt will reduce pain by overall 50% overall with usual activity Baseline: Goal status: ongoing 11/04/22 Pt will be able to ambulate community distances at least 1000 ft WNL gait pattern without complaints Baseline: Goal status: ongoing 11/04/22  PLAN: PT FREQUENCY: 1-3 times per week   PT DURATION: 6-8 weeks  PLANNED INTERVENTIONS (unless contraindicated): aquatic PT, Canalith repositioning, cryotherapy, Electrical stimulation, Iontophoresis with 4 mg/ml dexamethasome, Moist heat, traction, Ultrasound, gait training, Therapeutic exercise, balance training, neuromuscular re-education, patient/family education, prosthetic training, manual techniques, passive ROM, dry needling, taping, vasopnuematic device, vestibular, spinal manipulations, joint manipulations  PLAN FOR NEXT SESSION:  how was DN? continue traction and extension based program with Lumbar mobility and core strength progressions as able.    April Manson, PT,DPT 11/13/2022, 11:10 AM

## 2022-11-14 ENCOUNTER — Telehealth: Payer: Self-pay | Admitting: Orthopedic Surgery

## 2022-11-14 MED ORDER — OXYCODONE HCL 5 MG PO TABS: 2.5000 mg | ORAL_TABLET | Freq: Four times a day (QID) | ORAL | 0 refills | Status: AC | PRN

## 2022-11-14 NOTE — Telephone Encounter (Signed)
Patient called, he would like a refill on oxycodone. His call back number is [phone]

## 2022-11-17 ENCOUNTER — Ambulatory Visit: Payer: PPO | Admitting: Cardiology

## 2022-11-18 ENCOUNTER — Encounter: Payer: Self-pay | Admitting: Physical Therapy

## 2022-11-18 ENCOUNTER — Ambulatory Visit (INDEPENDENT_AMBULATORY_CARE_PROVIDER_SITE_OTHER): Payer: PPO | Admitting: Physical Therapy

## 2022-11-18 DIAGNOSIS — M5459 Other low back pain: Secondary | ICD-10-CM | POA: Diagnosis not present

## 2022-11-18 DIAGNOSIS — M6281 Muscle weakness (generalized): Secondary | ICD-10-CM | POA: Diagnosis not present

## 2022-11-18 DIAGNOSIS — R262 Difficulty in walking, not elsewhere classified: Secondary | ICD-10-CM | POA: Diagnosis not present

## 2022-11-18 NOTE — Therapy (Signed)
OUTPATIENT PHYSICAL THERAPY TREATMENT  Patient Name: Paul Ray MRN: [id number] DOB:16-Jan-1956, 67 y.o., male Today's Date: 11/18/2022  END OF SESSION:  PT End of Session - 11/18/22 1028     Visit Number 6    Number of Visits 10    Date for PT Re-Evaluation 12/08/22    Progress Note Due on Visit 10    PT Start Time 1015    PT Stop Time 1105    PT Time Calculation (min) 50 min    Activity Tolerance Patient tolerated treatment well    Behavior During Therapy [facility] for tasks assessed/performed                Past Medical History:  Diagnosis Date   Abscess of lung(513.0)    Right lower lobe   ADD (attention deficit disorder)    Anxiety    Arthritis    Ascending aortic aneurysm    47mm by Chest CTA 05/2020 and 48mm by echo 05/2020   Benign essential HTN    Bipolar depression    Coronary artery calcification seen on CAT scan    Empyema lung    Hemorrhoids    Hepatitis C    Pneumonia    last year   Prostate abscess    Viral pericarditis    after COVID 19 infection   Past Surgical History:  Procedure Laterality Date   ELBOW SURGERY  2005   Right   FOOT NEUROMA SURGERY  2006   Left   HIP RESURFACING     left hip at [facility] 01/2011   LUMBAR LAMINECTOMY/DECOMPRESSION MICRODISCECTOMY  07/24/2011   Procedure: LUMBAR LAMINECTOMY/DECOMPRESSION MICRODISCECTOMY;  Surgeon: Tia Alert;  Location: [facility];  Service: Neurosurgery;  Laterality: Bilateral;  Bilateral  , Lumbar Three-Four, Lumbar Four-Five Decompressive Laminectomy Rm # 32   LUNG SURGERY     for empyema   SHOULDER ARTHROSCOPY Right 07/05/2013   Procedure: RIGHT SHOULDER ARTHROSCOPY WITH DEBRIDEMENT;  Surgeon: Kathryne Hitch, MD;  Location: [facility] OR;  Service: Orthopedics;  Laterality: Right;   TENDON TRANSFER Left 01/26/2014   Procedure: LEFT THUMB TRAPEZIECTOMY KNOTTED TENDON INTRAPOSITION TRANSFER OF ABDUCTOR POLLICIS LONGUS TO THENARS;  Surgeon: Wyn Forster, MD;  Location: [facility];  Service: Orthopedics;  Laterality: Left;   THORACOTOMY  November 14 2010   rt   Patient Active Problem List   Diagnosis Date Noted   Protrusion of lumbar intervertebral disc 09/23/2022   History of lumbar laminectomy for spinal cord decompression 01/08/2022   Spinal stenosis of lumbar region 07/25/2021   Foraminal stenosis of cervical region 03/08/2021   Viral pericarditis    Alcohol use disorder, severe, dependence 12/06/2020   Chest pain 11/09/2020   Ascending aortic aneurysm    Benign essential HTN    Coronary artery calcification seen on CAT scan    Psychoactive substance-induced psychosis    Rib pain 07/01/2017   Closed fracture of one rib of left side 07/01/2017   Chronic right shoulder pain 05/04/2017   Osteoarthritis of right hip 12/02/2016   Lumbar radiculopathy 04/17/2014   Chronic low back pain 08/16/2013   Lumbar post-laminectomy syndrome 08/16/2013   Lumbosacral radiculitis 08/16/2013   Impingement syndrome of right shoulder 07/05/2013   Empyema lung    Abscess of lung(513.0)    Bipolar depression (HCC)    ADD (attention deficit disorder)    Hepatitis C    Hip pain 05/02/2011   History of total hip arthroplasty 04/23/2011  Lung mass 10/22/2010   Pleural effusion 10/15/2010    PCP: Alveria Apley, NP   REFERRING PROVIDER: London Sheer, MD   REFERRING DIAG: M54.41 (ICD-10-CM) - Acute midline low back pain with right-sided sciatica   Rationale for Evaluation and Treatment: Rehabilitation  THERAPY DIAG:  Other low back pain  Muscle weakness (generalized)  Difficulty in walking, not elsewhere classified  ONSET DATE: 3 month onset of back pain  SUBJECTIVE:                                                                                                                                                                                           SUBJECTIVE STATEMENT: He relays still about 8/10 pain in his back today, he is open to trying DN  again  PERTINENT HISTORY:  MRI showing L1/2 disc herniation, L1/2 central stenosis, L2/3 central lateral recess stenosis. Previous laminectomy/disectomy 2012 L3-5, shoulder arthroscopy 2014, bilat hip replacement/resurfacing  PAIN:  Are you having pain? Yes: NPRS scale: 7/10 Pain location: low back in center today  Pain description: ache, N/T Aggravating factors: standing, walking, moving Relieving factors: sitting  PRECAUTIONS: disc herniation  WEIGHT BEARING RESTRICTIONS: No  FALLS:  Has patient fallen in last 6 months? No   PLOF: Independent  PATIENT GOALS: play basketball again  NEXT MD VISIT:   OBJECTIVE:   DIAGNOSTIC FINDINGS:  MRI of the lumbar spine from 10/01/2022 was independently reviewed and interpreted by Dr Christell Constant, "showing L1/2 disc herniation with caudal migration on the right side.  There is central stenosis at that level as well.  There is central and lateral recess stenosis at L2/3.  Mild foraminal stenosis bilaterally at L1/2 and L2/3.  Prior hemilaminotomy at L3/4 and L4/5. "  PATIENT SURVEYS:  Eval: FOTO 36% functional, goal is 56%  SCREENING FOR RED FLAGS: Bowel or bladder incontinence: No Cauda equina syndrome: No   COGNITION: Overall cognitive status: Within functional limits for tasks assessed     SENSATION: WFL  MUSCLE LENGTH: Eval Hamstrings: tight bilat Thomas test: tight bilat, Rt is more tight  POSTURE:   PALPATION:   LUMBAR ROM:   AROM eval  Flexion WNL*  Extension 50% Feels good  Right lateral flexion 50%*  Left lateral flexion 50%*  Right rotation 50%*  Left rotation 50%*   (Blank rows = not tested) * denotes pain  LOWER EXTREMITY ROM:      Right eval Left eval  Hip flexion    Hip extension    Hip abduction    Hip adduction    Hip internal rotation    Hip external rotation    Knee  flexion    Knee extension    Ankle dorsiflexion    Ankle plantarflexion    Ankle inversion    Ankle eversion     (Blank  rows = not tested)  LOWER EXTREMITY MMT:    MMT Right eval Left eval R/L 11/18/22  Hip flexion 4 4   Hip extension     Hip abduction 4 4   Hip adduction     Hip internal rotation     Hip external rotation     Knee flexion 5 5   Knee extension 5 5   Ankle dorsiflexion 5 5   Ankle plantarflexion     Ankle inversion     Ankle eversion      (Blank rows = not tested)  LUMBAR SPECIAL TESTS:  Slump test: Negative but had recent injection  FUNCTIONAL TESTS:  Eval Pain noted with sit to stand, needed UE support to push up  GAIT: Eval Comments: independent community Ambulator but pain with prolonged walking    TODAY'S TREATMENT:  11/18/22 Sci fit bike LE/UE L5 X 5 min Standing lumbar extensions X10 at wall Standing rows and extensions, blue band X 20 each Standing high to low diagonal chop blue X 15 bilat Standing hip abduction, extension, marches X 15 each bilat with green band Seated lumbar stretch pball roll outs 5 sec X 10  Mechanical lumbar traction 90-70# X 10 min  Manual therapy for skilled palpation and Trigger Point Dry-Needling  Treatment instructions: Expect mild to moderate muscle soreness. Patient Consent Given: Yes Education handout provided: verbally provided Muscles treated: Cervical-and lumbar paraspinals/multifidi,  Treatment response/outcome: good overall tolerance,twitch response noted   11/13/22 Recumbent bike L4 X 5 min Standing lumbar extensions X10 at wall Standing rows and extensions, blue band X 20 each Seated lumbar stretch pball roll outs 5 sec X 15  Mechanical lumbar traction 90-70# X 15 min  Manual therapy for skilled palpation and Trigger Point Dry-Needling  Treatment instructions: Expect mild to moderate muscle soreness. Patient Consent Given: Yes Education handout provided: verbally provided Muscles treated: Cervical-and lumbar paraspinals/multifidi,  Treatment response/outcome: good overall tolerance,twitch response noted      PATIENT EDUCATION: Education details: HEP, PT plan of care Person educated: Patient Education method: Explanation, Demonstration, Verbal cues, and Handouts Education comprehension: verbalized understanding and needs further education   HOME EXERCISE PROGRAM: Access Code: [email or web address] URL: [email or web address] Date: 10/27/2022 Prepared by: Ivery Quale  Exercises - Standing Lumbar Extension  - 5 x daily - 6 x weekly - 1-2 sets - 10 reps - 5 hold - Supine Piriformis Stretch with Foot on Ground  - 2 x daily - 6 x weekly - 1 sets - 3 reps - 30 hold - Supine Bridge  - 2 x daily - 6 x weekly - 1-2 sets - 10 reps - 5 hold - Standing Hip Flexion with Resistance Loop  - 2 x daily - 6 x weekly - 2 sets - 10-15 reps - Hip Abduction with Resistance Loop  - 2 x daily - 6 x weekly - 2 sets - 10-15 reps - Modified Thomas Stretch  - 2 x daily - 6 x weekly - 3-5 sets - 20-30 hold  ASSESSMENT:  CLINICAL IMPRESSION: I progressed his strength program slightly with good overall tolerance. We did continue with traction and DN as he feels this may be helping.    OBJECTIVE IMPAIRMENTS: decreased activity tolerance, difficulty walking, decreased balance, decreased endurance, decreased mobility, decreased ROM, decreased strength, impaired flexibility, impaired  UE/LE use, postural dysfunction, and pain.  ACTIVITY LIMITATIONS: bending, lifting, carry, locomotion, cleaning, community activity, driving, and or occupation  PERSONAL FACTORS: Previous laminectomy/disectomy 2012 L3-5, shoulder arthroscopy 2014, bilat hip replacement/resurfacing are also affecting patient's functional outcome.  REHAB POTENTIAL: Fair    CLINICAL DECISION MAKING: Stable/uncomplicated  EVALUATION COMPLEXITY: Low    GOALS: Short term PT Goals Target date: 11/24/2022   Pt will be I and compliant with HEP. Baseline:  Goal status: ongoing 11/04/22 Pt will decrease pain by 25% overall Baseline:8 Goal status:  ongoing 11/04/22  Long term PT goals Target date:12/08/2022   Pt will improve lumbar ROM to [facility] without complaints to improve functional mobility Baseline: Goal status: ongoing 11/04/22 Pt will improve bilat hip strength to at least 4+/5 MMT to improve functional strength Baseline: Goal status: ongoing 11/04/22 Pt will improve FOTO to at least 56% functional to show improved function Baseline:8 Goal status: ongoing 11/04/22 Pt will reduce pain by overall 50% overall with usual activity Baseline: Goal status: ongoing 11/04/22 Pt will be able to ambulate community distances at least 1000 ft WNL gait pattern without complaints Baseline: Goal status: ongoing 11/04/22  PLAN: PT FREQUENCY: 1-3 times per week   PT DURATION: 6-8 weeks  PLANNED INTERVENTIONS (unless contraindicated): aquatic PT, Canalith repositioning, cryotherapy, Electrical stimulation, Iontophoresis with 4 mg/ml dexamethasome, Moist heat, traction, Ultrasound, gait training, Therapeutic exercise, balance training, neuromuscular re-education, patient/family education, prosthetic training, manual techniques, passive ROM, dry needling, taping, vasopnuematic device, vestibular, spinal manipulations, joint manipulations  PLAN FOR NEXT SESSION:  how was DN? continue traction and extension based program with Lumbar mobility and core strength progressions as able.    April Manson, PT,DPT 11/18/2022, 11:16 AM

## 2022-11-20 ENCOUNTER — Encounter: Payer: Self-pay | Admitting: Physical Therapy

## 2022-11-20 ENCOUNTER — Ambulatory Visit (INDEPENDENT_AMBULATORY_CARE_PROVIDER_SITE_OTHER): Payer: PPO | Admitting: Physical Therapy

## 2022-11-20 ENCOUNTER — Telehealth: Payer: Self-pay | Admitting: Orthopedic Surgery

## 2022-11-20 DIAGNOSIS — R262 Difficulty in walking, not elsewhere classified: Secondary | ICD-10-CM

## 2022-11-20 DIAGNOSIS — M5459 Other low back pain: Secondary | ICD-10-CM

## 2022-11-20 DIAGNOSIS — M6281 Muscle weakness (generalized): Secondary | ICD-10-CM | POA: Diagnosis not present

## 2022-11-20 MED ORDER — TRAMADOL HCL 50 MG PO TABS: 50.0000 mg | ORAL_TABLET | Freq: Four times a day (QID) | ORAL | 0 refills | Status: DC | PRN

## 2022-11-20 NOTE — Telephone Encounter (Signed)
Pt called requesting a refill of pain medication. Please send to [facility] on [address]. Pt phone number is [phone].

## 2022-11-20 NOTE — Therapy (Signed)
OUTPATIENT PHYSICAL THERAPY TREATMENT  Patient Name: Paul Ray MRN: [id number] DOB:Feb 25, 1956, 67 y.o., male Today's Date: 11/20/2022  END OF SESSION:  PT End of Session - 11/20/22 1012     Visit Number 7    Number of Visits 10    Date for PT Re-Evaluation 12/08/22    Progress Note Due on Visit 10    PT Start Time 1012    PT Stop Time 1055    PT Time Calculation (min) 43 min    Activity Tolerance Patient tolerated treatment well    Behavior During Therapy WFL for tasks assessed/performed                Past Medical History:  Diagnosis Date   Abscess of lung(513.0)    Right lower lobe   ADD (attention deficit disorder)    Anxiety    Arthritis    Ascending aortic aneurysm    47mm by Chest CTA 05/2020 and 48mm by echo 05/2020   Benign essential HTN    Bipolar depression    Coronary artery calcification seen on CAT scan    Empyema lung    Hemorrhoids    Hepatitis C    Pneumonia    last year   Prostate abscess    Viral pericarditis    after COVID 19 infection   Past Surgical History:  Procedure Laterality Date   ELBOW SURGERY  2005   Right   FOOT NEUROMA SURGERY  2006   Left   HIP RESURFACING     left hip at [facility] 01/2011   LUMBAR LAMINECTOMY/DECOMPRESSION MICRODISCECTOMY  07/24/2011   Procedure: LUMBAR LAMINECTOMY/DECOMPRESSION MICRODISCECTOMY;  Surgeon: Tia Alert;  Location: [facility];  Service: Neurosurgery;  Laterality: Bilateral;  Bilateral  , Lumbar Three-Four, Lumbar Four-Five Decompressive Laminectomy Rm # 32   LUNG SURGERY     for empyema   SHOULDER ARTHROSCOPY Right 07/05/2013   Procedure: RIGHT SHOULDER ARTHROSCOPY WITH DEBRIDEMENT;  Surgeon: Kathryne Hitch, MD;  Location: [facility] OR;  Service: Orthopedics;  Laterality: Right;   TENDON TRANSFER Left 01/26/2014   Procedure: LEFT THUMB TRAPEZIECTOMY KNOTTED TENDON INTRAPOSITION TRANSFER OF ABDUCTOR POLLICIS LONGUS TO THENARS;  Surgeon: Wyn Forster, MD;  Location: [facility];  Service: Orthopedics;  Laterality: Left;   THORACOTOMY  November 14 2010   rt   Patient Active Problem List   Diagnosis Date Noted   Protrusion of lumbar intervertebral disc 09/23/2022   History of lumbar laminectomy for spinal cord decompression 01/08/2022   Spinal stenosis of lumbar region 07/25/2021   Foraminal stenosis of cervical region 03/08/2021   Viral pericarditis    Alcohol use disorder, severe, dependence 12/06/2020   Chest pain 11/09/2020   Ascending aortic aneurysm    Benign essential HTN    Coronary artery calcification seen on CAT scan    Psychoactive substance-induced psychosis    Rib pain 07/01/2017   Closed fracture of one rib of left side 07/01/2017   Chronic right shoulder pain 05/04/2017   Osteoarthritis of right hip 12/02/2016   Lumbar radiculopathy 04/17/2014   Chronic low back pain 08/16/2013   Lumbar post-laminectomy syndrome 08/16/2013   Lumbosacral radiculitis 08/16/2013   Impingement syndrome of right shoulder 07/05/2013   Empyema lung    Abscess of lung(513.0)    Bipolar depression (HCC)    ADD (attention deficit disorder)    Hepatitis C    Hip pain 05/02/2011   History of total hip arthroplasty 04/23/2011  Lung mass 10/22/2010   Pleural effusion 10/15/2010    PCP: Alveria Apley, NP   REFERRING PROVIDER: London Sheer, MD   REFERRING DIAG: M54.41 (ICD-10-CM) - Acute midline low back pain with right-sided sciatica   Rationale for Evaluation and Treatment: Rehabilitation  THERAPY DIAG:  Other low back pain  Muscle weakness (generalized)  Difficulty in walking, not elsewhere classified  ONSET DATE: 3 month onset of back pain  SUBJECTIVE:                                                                                                                                                                                           SUBJECTIVE STATEMENT: He relays he has a constant pain but this is no longer off the charts like it  was before he started PT. Pain stays about 7/10 in low back PERTINENT HISTORY:  MRI showing L1/2 disc herniation, L1/2 central stenosis, L2/3 central lateral recess stenosis. Previous laminectomy/disectomy 2012 L3-5, shoulder arthroscopy 2014, bilat hip replacement/resurfacing  PAIN:  Are you having pain? Yes: NPRS scale: 7/10 Pain location: low back in center today  Pain description: ache, N/T Aggravating factors: standing, walking, moving Relieving factors: sitting  PRECAUTIONS: disc herniation  WEIGHT BEARING RESTRICTIONS: No  FALLS:  Has patient fallen in last 6 months? No   PLOF: Independent  PATIENT GOALS: play basketball again  NEXT MD VISIT:   OBJECTIVE:   DIAGNOSTIC FINDINGS:  MRI of the lumbar spine from 10/01/2022 was independently reviewed and interpreted by Dr Christell Constant, "showing L1/2 disc herniation with caudal migration on the right side.  There is central stenosis at that level as well.  There is central and lateral recess stenosis at L2/3.  Mild foraminal stenosis bilaterally at L1/2 and L2/3.  Prior hemilaminotomy at L3/4 and L4/5. "  PATIENT SURVEYS:  Eval: FOTO 36% functional, goal is 56%  SCREENING FOR RED FLAGS: Bowel or bladder incontinence: No Cauda equina syndrome: No   COGNITION: Overall cognitive status: Within functional limits for tasks assessed     SENSATION: WFL  MUSCLE LENGTH: Eval Hamstrings: tight bilat Thomas test: tight bilat, Rt is more tight  POSTURE:   PALPATION:   LUMBAR ROM:   AROM eval 11/20/22  Flexion WNL* WNL  Extension 50% Feels good 75%  Right lateral flexion 50%* 75%  Left lateral flexion 50%* 75%  Right rotation 50%* 75%  Left rotation 50%* 75%   (Blank rows = not tested) * denotes pain  LOWER EXTREMITY ROM:      Right eval Left eval  Hip flexion    Hip extension    Hip abduction  Hip adduction    Hip internal rotation    Hip external rotation    Knee flexion    Knee extension    Ankle  dorsiflexion    Ankle plantarflexion    Ankle inversion    Ankle eversion     (Blank rows = not tested)  LOWER EXTREMITY MMT:    MMT Right eval Left eval R/L 11/20/22  Hip flexion 4 4   Hip extension     Hip abduction 4 4   Hip adduction     Hip internal rotation     Hip external rotation     Knee flexion 5 5   Knee extension 5 5   Ankle dorsiflexion 5 5   Ankle plantarflexion     Ankle inversion     Ankle eversion      (Blank rows = not tested)  LUMBAR SPECIAL TESTS:  Slump test: Negative but had recent injection  FUNCTIONAL TESTS:  Eval Pain noted with sit to stand, needed UE support to push up  GAIT: Eval Comments: independent community Ambulator but pain with prolonged walking    TODAY'S TREATMENT:  11/20/22 Sci fit bike LE/UE L5 X 5 min Standing lumbar extensions X10 at wall Seated lumbar stretch pball roll outs 5 sec X 10 Standing rows and extensions, blue band X 20 each Standing hip abduction, extension, marches X 20 each bilat with green band Supine hip flexor stretch 30 sec X 3 bilat  Mechanical lumbar traction 90-70# X 10 min  11/18/22 Sci fit bike LE/UE L5 X 5 min Standing lumbar extensions X10 at wall Standing rows and extensions, blue band X 20 each Standing high to low diagonal chop blue X 15 bilat Standing hip abduction, extension, marches X 15 each bilat with green band Seated lumbar stretch pball roll outs 5 sec X 10  Mechanical lumbar traction 90-70# X 10 min  Manual therapy for skilled palpation and Trigger Point Dry-Needling  Treatment instructions: Expect mild to moderate muscle soreness. Patient Consent Given: Yes Education handout provided: verbally provided Muscles treated: Cervical-and lumbar paraspinals/multifidi,  Treatment response/outcome: good overall tolerance,twitch response noted   11/13/22 Recumbent bike L4 X 5 min Standing lumbar extensions X10 at wall Standing rows and extensions, blue band X 20 each Seated lumbar  stretch pball roll outs 5 sec X 15  Mechanical lumbar traction 90-70# X 15 min  Manual therapy for skilled palpation and Trigger Point Dry-Needling  Treatment instructions: Expect mild to moderate muscle soreness. Patient Consent Given: Yes Education handout provided: verbally provided Muscles treated: Cervical-and lumbar paraspinals/multifidi,  Treatment response/outcome: good overall tolerance,twitch response noted     PATIENT EDUCATION: Education details: HEP, PT plan of care Person educated: Patient Education method: Explanation, Demonstration, Verbal cues, and Handouts Education comprehension: verbalized understanding and needs further education   HOME EXERCISE PROGRAM: Access Code: [email or web address] URL: [email or web address] Date: 10/27/2022 Prepared by: Ivery Quale  Exercises - Standing Lumbar Extension  - 5 x daily - 6 x weekly - 1-2 sets - 10 reps - 5 hold - Supine Piriformis Stretch with Foot on Ground  - 2 x daily - 6 x weekly - 1 sets - 3 reps - 30 hold - Supine Bridge  - 2 x daily - 6 x weekly - 1-2 sets - 10 reps - 5 hold - Standing Hip Flexion with Resistance Loop  - 2 x daily - 6 x weekly - 2 sets - 10-15 reps - Hip Abduction with Resistance Loop  - 2 x  daily - 6 x weekly - 2 sets - 10-15 reps - Modified Thomas Stretch  - 2 x daily - 6 x weekly - 3-5 sets - 20-30 hold  ASSESSMENT:  CLINICAL IMPRESSION: He deferred DN today, he did want to continue with traction as he feels this is helping. He did show some improvements with lumbar mobility and hip strength noted today with updated measurments  OBJECTIVE IMPAIRMENTS: decreased activity tolerance, difficulty walking, decreased balance, decreased endurance, decreased mobility, decreased ROM, decreased strength, impaired flexibility, impaired UE/LE use, postural dysfunction, and pain.  ACTIVITY LIMITATIONS: bending, lifting, carry, locomotion, cleaning, community activity, driving, and or  occupation  PERSONAL FACTORS: Previous laminectomy/disectomy 2012 L3-5, shoulder arthroscopy 2014, bilat hip replacement/resurfacing are also affecting patient's functional outcome.  REHAB POTENTIAL: Fair    CLINICAL DECISION MAKING: Stable/uncomplicated  EVALUATION COMPLEXITY: Low    GOALS: Short term PT Goals Target date: 11/24/2022   Pt will be I and compliant with HEP. Baseline:  Goal status: ongoing 11/04/22 Pt will decrease pain by 25% overall Baseline:8 Goal status: ongoing 11/04/22  Long term PT goals Target date:12/08/2022   Pt will improve lumbar ROM to [facility] without complaints to improve functional mobility Baseline: Goal status: ongoing 11/04/22 Pt will improve bilat hip strength to at least 4+/5 MMT to improve functional strength Baseline: Goal status: ongoing 11/04/22 Pt will improve FOTO to at least 56% functional to show improved function Baseline:8 Goal status: ongoing 11/04/22 Pt will reduce pain by overall 50% overall with usual activity Baseline: Goal status: ongoing 11/04/22 Pt will be able to ambulate community distances at least 1000 ft WNL gait pattern without complaints Baseline: Goal status: ongoing 11/04/22  PLAN: PT FREQUENCY: 1-3 times per week   PT DURATION: 6-8 weeks  PLANNED INTERVENTIONS (unless contraindicated): aquatic PT, Canalith repositioning, cryotherapy, Electrical stimulation, Iontophoresis with 4 mg/ml dexamethasome, Moist heat, traction, Ultrasound, gait training, Therapeutic exercise, balance training, neuromuscular re-education, patient/family education, prosthetic training, manual techniques, passive ROM, dry needling, taping, vasopnuematic device, vestibular, spinal manipulations, joint manipulations  PLAN FOR NEXT SESSION:  strength progressions, traction and or DN if desired  April Manson, PT,DPT 11/20/2022, 10:19 AM

## 2022-11-21 ENCOUNTER — Encounter (HOSPITAL_COMMUNITY): Payer: PPO

## 2022-11-21 ENCOUNTER — Telehealth: Payer: Self-pay | Admitting: Behavioral Health

## 2022-11-21 ENCOUNTER — Other Ambulatory Visit: Payer: Self-pay | Admitting: Behavioral Health

## 2022-11-21 DIAGNOSIS — F3160 Bipolar disorder, current episode mixed, unspecified: Secondary | ICD-10-CM

## 2022-11-21 DIAGNOSIS — F331 Major depressive disorder, recurrent, moderate: Secondary | ICD-10-CM

## 2022-11-21 DIAGNOSIS — F411 Generalized anxiety disorder: Secondary | ICD-10-CM

## 2022-11-21 MED ORDER — AMPHETAMINE-DEXTROAMPHETAMINE 20 MG PO TABS
ORAL_TABLET | ORAL | 0 refills | Status: DC
Start: 2022-11-21 — End: 2022-12-16

## 2022-11-21 MED ORDER — AMPHETAMINE-DEXTROAMPHET ER 30 MG PO CP24
ORAL_CAPSULE | ORAL | 0 refills | Status: DC
Start: 2022-11-21 — End: 2022-12-16

## 2022-11-21 NOTE — Telephone Encounter (Signed)
Pt LVM @ 9:02a.  He would like refill of Adderall sent to [facility] [address]  Next appt 8/5

## 2022-11-25 ENCOUNTER — Encounter: Payer: Self-pay | Admitting: Physical Therapy

## 2022-11-25 ENCOUNTER — Ambulatory Visit (INDEPENDENT_AMBULATORY_CARE_PROVIDER_SITE_OTHER): Payer: PPO | Admitting: Physical Therapy

## 2022-11-25 DIAGNOSIS — M6281 Muscle weakness (generalized): Secondary | ICD-10-CM

## 2022-11-25 DIAGNOSIS — M5459 Other low back pain: Secondary | ICD-10-CM | POA: Diagnosis not present

## 2022-11-25 DIAGNOSIS — R262 Difficulty in walking, not elsewhere classified: Secondary | ICD-10-CM

## 2022-11-25 NOTE — Therapy (Signed)
OUTPATIENT PHYSICAL THERAPY TREATMENT  Patient Name: Paul Ray MRN: [id number] DOB:25-Mar-1956, 67 y.o., male Today's Date: 11/25/2022  END OF SESSION:  PT End of Session - 11/25/22 1020     Visit Number 8    Number of Visits 10    Date for PT Re-Evaluation 12/08/22    Progress Note Due on Visit 10    PT Start Time 1015    PT Stop Time 1100    PT Time Calculation (min) 45 min    Activity Tolerance Patient tolerated treatment well    Behavior During Therapy [facility] for tasks assessed/performed                Past Medical History:  Diagnosis Date   Abscess of lung(513.0)    Right lower lobe   ADD (attention deficit disorder)    Anxiety    Arthritis    Ascending aortic aneurysm (HCC)    47mm by Chest CTA 05/2020 and 48mm by echo 05/2020   Benign essential HTN    Bipolar depression (HCC)    Coronary artery calcification seen on CAT scan    Empyema lung (HCC)    Hemorrhoids    Hepatitis C    Pneumonia    last year   Prostate abscess    Viral pericarditis    after COVID 19 infection   Past Surgical History:  Procedure Laterality Date   ELBOW SURGERY  2005   Right   FOOT NEUROMA SURGERY  2006   Left   HIP RESURFACING     left hip at [facility] 01/2011   LUMBAR LAMINECTOMY/DECOMPRESSION MICRODISCECTOMY  07/24/2011   Procedure: LUMBAR LAMINECTOMY/DECOMPRESSION MICRODISCECTOMY;  Surgeon: Tia Alert;  Location: [facility];  Service: Neurosurgery;  Laterality: Bilateral;  Bilateral  , Lumbar Three-Four, Lumbar Four-Five Decompressive Laminectomy Rm # 32   LUNG SURGERY     for empyema   SHOULDER ARTHROSCOPY Right 07/05/2013   Procedure: RIGHT SHOULDER ARTHROSCOPY WITH DEBRIDEMENT;  Surgeon: Kathryne Hitch, MD;  Location: [facility] OR;  Service: Orthopedics;  Laterality: Right;   TENDON TRANSFER Left 01/26/2014   Procedure: LEFT THUMB TRAPEZIECTOMY KNOTTED TENDON INTRAPOSITION TRANSFER OF ABDUCTOR POLLICIS LONGUS TO THENARS;  Surgeon: Wyn Forster, MD;  Location:  [facility];  Service: Orthopedics;  Laterality: Left;   THORACOTOMY  November 14 2010   rt   Patient Active Problem List   Diagnosis Date Noted   Protrusion of lumbar intervertebral disc 09/23/2022   History of lumbar laminectomy for spinal cord decompression 01/08/2022   Spinal stenosis of lumbar region 07/25/2021   Foraminal stenosis of cervical region 03/08/2021   Viral pericarditis    Alcohol use disorder, severe, dependence (HCC) 12/06/2020   Chest pain 11/09/2020   Ascending aortic aneurysm (HCC)    Benign essential HTN    Coronary artery calcification seen on CAT scan    Psychoactive substance-induced psychosis (HCC)    Rib pain 07/01/2017   Closed fracture of one rib of left side 07/01/2017   Chronic right shoulder pain 05/04/2017   Osteoarthritis of right hip 12/02/2016   Lumbar radiculopathy 04/17/2014   Chronic low back pain 08/16/2013   Lumbar post-laminectomy syndrome 08/16/2013   Lumbosacral radiculitis 08/16/2013   Impingement syndrome of right shoulder 07/05/2013   Empyema lung (HCC)    Abscess of lung(513.0)    Bipolar depression (HCC)    ADD (attention deficit disorder)    Hepatitis C    Hip pain 05/02/2011   History  of total hip arthroplasty 04/23/2011   Lung mass 10/22/2010   Pleural effusion 10/15/2010    PCP: Alveria Apley, NP   REFERRING PROVIDER: London Sheer, MD   REFERRING DIAG: M54.41 (ICD-10-CM) - Acute midline low back pain with right-sided sciatica   Rationale for Evaluation and Treatment: Rehabilitation  THERAPY DIAG:  Other low back pain  Muscle weakness (generalized)  Difficulty in walking, not elsewhere classified  ONSET DATE: 3 month onset of back pain  SUBJECTIVE:                                                                                                                                                                                           SUBJECTIVE STATEMENT: He relays pain stays around  7/10 PERTINENT HISTORY:  MRI showing L1/2 disc herniation, L1/2 central stenosis, L2/3 central lateral recess stenosis. Previous laminectomy/disectomy 2012 L3-5, shoulder arthroscopy 2014, bilat hip replacement/resurfacing  PAIN:  Are you having pain? Yes: NPRS scale: 7/10 Pain location: low back in center today  Pain description: ache, N/T Aggravating factors: standing, walking, moving Relieving factors: sitting  PRECAUTIONS: disc herniation  WEIGHT BEARING RESTRICTIONS: No  FALLS:  Has patient fallen in last 6 months? No   PLOF: Independent  PATIENT GOALS: play basketball again  NEXT MD VISIT:   OBJECTIVE:   DIAGNOSTIC FINDINGS:  MRI of the lumbar spine from 10/01/2022 was independently reviewed and interpreted by Dr Christell Constant, "showing L1/2 disc herniation with caudal migration on the right side.  There is central stenosis at that level as well.  There is central and lateral recess stenosis at L2/3.  Mild foraminal stenosis bilaterally at L1/2 and L2/3.  Prior hemilaminotomy at L3/4 and L4/5. "  PATIENT SURVEYS:  Eval: FOTO 36% functional, goal is 56%  SCREENING FOR RED FLAGS: Bowel or bladder incontinence: No Cauda equina syndrome: No   COGNITION: Overall cognitive status: Within functional limits for tasks assessed     SENSATION: WFL  MUSCLE LENGTH: Eval Hamstrings: tight bilat Thomas test: tight bilat, Rt is more tight  POSTURE:   PALPATION:   LUMBAR ROM:   AROM eval 11/20/22  Flexion WNL* WNL  Extension 50% Feels good 75%  Right lateral flexion 50%* 75%  Left lateral flexion 50%* 75%  Right rotation 50%* 75%  Left rotation 50%* 75%   (Blank rows = not tested) * denotes pain  LOWER EXTREMITY ROM:      Right eval Left eval  Hip flexion    Hip extension    Hip abduction    Hip adduction    Hip internal rotation    Hip external rotation  Knee flexion    Knee extension    Ankle dorsiflexion    Ankle plantarflexion    Ankle inversion     Ankle eversion     (Blank rows = not tested)  LOWER EXTREMITY MMT:    MMT Right eval Left eval R/L 11/20/22  Hip flexion 4 4   Hip extension     Hip abduction 4 4   Hip adduction     Hip internal rotation     Hip external rotation     Knee flexion 5 5   Knee extension 5 5   Ankle dorsiflexion 5 5   Ankle plantarflexion     Ankle inversion     Ankle eversion      (Blank rows = not tested)  LUMBAR SPECIAL TESTS:  Slump test: Negative but had recent injection  FUNCTIONAL TESTS:  Eval Pain noted with sit to stand, needed UE support to push up  GAIT: Eval Comments: independent community Ambulator but pain with prolonged walking    TODAY'S TREATMENT:  11/25/22 Nu step L6 X 5 min UE/LE Standing lumbar extensions X10 at wall Seated lumbar stretch pball roll outs 5 sec X 10 Standing rows and extensions, blue band X 20 each Seated lat pull machine 25# 2X15 Seated row machine 25# X 15 Seated chest press machine 15# 2X15 Wall squats with ball on wall X 20 Supine hip flexor stretch 30 sec X 3 bilat  Mechanical lumbar traction 90-70# X 10 min  Manual therapy for skilled palpation and Trigger Point Dry-Needling  Treatment instructions: Expect mild to moderate muscle soreness. Patient Consent Given: Yes Education handout provided: verbally provided Muscles treated: Cervical-and lumbar paraspinals/multifidi,  Treatment response/outcome: good overall tolerance,twitch response noted    11/20/22 Sci fit bike LE/UE L5 X 5 min Standing lumbar extensions X10 at wall Seated lumbar stretch pball roll outs 5 sec X 10 Standing rows and extensions, blue band X 20 each Standing hip abduction, extension, marches X 20 each bilat with green band Supine hip flexor stretch 30 sec X 3 bilat  Mechanical lumbar traction 90-70# X 10 min  11/18/22 Sci fit bike LE/UE L5 X 5 min Standing lumbar extensions X10 at wall Standing rows and extensions, blue band X 20 each Standing high to low  diagonal chop blue X 15 bilat Standing hip abduction, extension, marches X 15 each bilat with green band Seated lumbar stretch pball roll outs 5 sec X 10  Mechanical lumbar traction 90-70# X 10 min  Manual therapy for skilled palpation and Trigger Point Dry-Needling  Treatment instructions: Expect mild to moderate muscle soreness. Patient Consent Given: Yes Education handout provided: verbally provided Muscles treated: Cervical-and lumbar paraspinals/multifidi,  Treatment response/outcome: good overall tolerance,twitch response noted   11/13/22 Recumbent bike L4 X 5 min Standing lumbar extensions X10 at wall Standing rows and extensions, blue band X 20 each Seated lumbar stretch pball roll outs 5 sec X 15  Mechanical lumbar traction 90-70# X 15 min  Manual therapy for skilled palpation and Trigger Point Dry-Needling  Treatment instructions: Expect mild to moderate muscle soreness. Patient Consent Given: Yes Education handout provided: verbally provided Muscles treated: Cervical-and lumbar paraspinals/multifidi,  Treatment response/outcome: good overall tolerance,twitch response noted     PATIENT EDUCATION: Education details: HEP, PT plan of care Person educated: Patient Education method: Explanation, Demonstration, Verbal cues, and Handouts Education comprehension: verbalized understanding and needs further education   HOME EXERCISE PROGRAM: Access Code: [email or web address] URL: [email or web address] Date: 10/27/2022 Prepared by: Ivery Quale  Exercises - Standing Lumbar Extension  - 5 x daily - 6 x weekly - 1-2 sets - 10 reps - 5 hold - Supine Piriformis Stretch with Foot on Ground  - 2 x daily - 6 x weekly - 1 sets - 3 reps - 30 hold - Supine Bridge  - 2 x daily - 6 x weekly - 1-2 sets - 10 reps - 5 hold - Standing Hip Flexion with Resistance Loop  - 2 x daily - 6 x weekly - 2 sets - 10-15 reps - Hip Abduction with Resistance Loop  - 2 x daily - 6 x weekly - 2 sets  - 10-15 reps - Modified Thomas Stretch  - 2 x daily - 6 x weekly - 3-5 sets - 20-30 hold  ASSESSMENT:  CLINICAL IMPRESSION: He continues to be limited by pain and he did want to try DN again today to see if this helps with his pain any. He has one more visit left and we will reassess his progress and goals to see if we need to continue with any additional PT.  OBJECTIVE IMPAIRMENTS: decreased activity tolerance, difficulty walking, decreased balance, decreased endurance, decreased mobility, decreased ROM, decreased strength, impaired flexibility, impaired UE/LE use, postural dysfunction, and pain.  ACTIVITY LIMITATIONS: bending, lifting, carry, locomotion, cleaning, community activity, driving, and or occupation  PERSONAL FACTORS: Previous laminectomy/disectomy 2012 L3-5, shoulder arthroscopy 2014, bilat hip replacement/resurfacing are also affecting patient's functional outcome.  REHAB POTENTIAL: Fair    CLINICAL DECISION MAKING: Stable/uncomplicated  EVALUATION COMPLEXITY: Low    GOALS: Short term PT Goals Target date: 11/24/2022   Pt will be I and compliant with HEP. Baseline:  Goal status: ongoing 11/04/22 Pt will decrease pain by 25% overall Baseline:8 Goal status: ongoing 11/04/22  Long term PT goals Target date:12/08/2022   Pt will improve lumbar ROM to [facility] without complaints to improve functional mobility Baseline: Goal status: ongoing 11/04/22 Pt will improve bilat hip strength to at least 4+/5 MMT to improve functional strength Baseline: Goal status: ongoing 11/04/22 Pt will improve FOTO to at least 56% functional to show improved function Baseline:8 Goal status: ongoing 11/04/22 Pt will reduce pain by overall 50% overall with usual activity Baseline: Goal status: ongoing 11/04/22 Pt will be able to ambulate community distances at least 1000 ft WNL gait pattern without complaints Baseline: Goal status: ongoing 11/04/22  PLAN: PT FREQUENCY: 1-3 times per week   PT  DURATION: 6-8 weeks  PLANNED INTERVENTIONS (unless contraindicated): aquatic PT, Canalith repositioning, cryotherapy, Electrical stimulation, Iontophoresis with 4 mg/ml dexamethasome, Moist heat, traction, Ultrasound, gait training, Therapeutic exercise, balance training, neuromuscular re-education, patient/family education, prosthetic training, manual techniques, passive ROM, dry needling, taping, vasopnuematic device, vestibular, spinal manipulations, joint manipulations  PLAN FOR NEXT SESSION:  he has one more visit left so reassess plan of care  April Manson, PT,DPT 11/25/2022, 10:22 AM

## 2022-11-26 ENCOUNTER — Telehealth: Payer: Self-pay | Admitting: Orthopedic Surgery

## 2022-11-26 MED ORDER — TRAMADOL HCL 50 MG PO TABS: 50.0000 mg | ORAL_TABLET | Freq: Four times a day (QID) | ORAL | 0 refills | Status: AC | PRN

## 2022-11-26 NOTE — Telephone Encounter (Signed)
Rx refill Tramadol  [facility]

## 2022-11-27 ENCOUNTER — Encounter: Payer: Self-pay | Admitting: Physical Therapy

## 2022-11-27 ENCOUNTER — Ambulatory Visit (INDEPENDENT_AMBULATORY_CARE_PROVIDER_SITE_OTHER): Payer: PPO | Admitting: Physical Therapy

## 2022-11-27 DIAGNOSIS — M5459 Other low back pain: Secondary | ICD-10-CM

## 2022-11-27 DIAGNOSIS — M6281 Muscle weakness (generalized): Secondary | ICD-10-CM | POA: Diagnosis not present

## 2022-11-27 DIAGNOSIS — R262 Difficulty in walking, not elsewhere classified: Secondary | ICD-10-CM

## 2022-11-27 NOTE — Therapy (Signed)
OUTPATIENT PHYSICAL THERAPY TREATMENT/Recert/Progress note Progress Note reporting period 10/27/22 to 11/27/22  See below for objective and subjective measurements relating to patients progress with PT.   Patient Name: Paul Ray MRN: [id number] DOB:Dec 22, 1955, 67 y.o., male Today's Date: 11/27/2022  END OF SESSION:  PT End of Session - 11/27/22 1030     Visit Number 9    Number of Visits 15    Date for PT Re-Evaluation 01/01/23    Progress Note Due on Visit 19    PT Start Time 1016    PT Stop Time 1110    PT Time Calculation (min) 54 min    Activity Tolerance Patient tolerated treatment well    Behavior During Therapy WFL for tasks assessed/performed                Past Medical History:  Diagnosis Date   Abscess of lung(513.0)    Right lower lobe   ADD (attention deficit disorder)    Anxiety    Arthritis    Ascending aortic aneurysm (HCC)    47mm by Chest CTA 05/2020 and 48mm by echo 05/2020   Benign essential HTN    Bipolar depression (HCC)    Coronary artery calcification seen on CAT scan    Empyema lung (HCC)    Hemorrhoids    Hepatitis C    Pneumonia    last year   Prostate abscess    Viral pericarditis    after COVID 19 infection   Past Surgical History:  Procedure Laterality Date   ELBOW SURGERY  2005   Right   FOOT NEUROMA SURGERY  2006   Left   HIP RESURFACING     left hip [facility] 01/2011   LUMBAR LAMINECTOMY/DECOMPRESSION MICRODISCECTOMY  07/24/2011   Procedure: LUMBAR LAMINECTOMY/DECOMPRESSION MICRODISCECTOMY;  Surgeon: Tia Alert;  Location: [facility];  Service: Neurosurgery;  Laterality: Bilateral;  Bilateral  , Lumbar Three-Four, Lumbar Four-Five Decompressive Laminectomy Rm # [id number]   LUNG SURGERY     for empyema   SHOULDER ARTHROSCOPY Right 07/05/2013   Procedure: RIGHT SHOULDER ARTHROSCOPY WITH DEBRIDEMENT;  Surgeon: Kathryne Hitch, MD;  Location: [facility] OR;  Service: Orthopedics;  Laterality: Right;   TENDON TRANSFER Left 01/26/2014    Procedure: LEFT THUMB TRAPEZIECTOMY KNOTTED TENDON INTRAPOSITION TRANSFER OF ABDUCTOR POLLICIS LONGUS TO THENARS;  Surgeon: Wyn Forster, MD;  Location: [facility];  Service: Orthopedics;  Laterality: Left;   THORACOTOMY  November 14 2010   rt   Patient Active Problem List   Diagnosis Date Noted   Protrusion of lumbar intervertebral disc 09/23/2022   History of lumbar laminectomy for spinal cord decompression 01/08/2022   Spinal stenosis of lumbar region 07/25/2021   Foraminal stenosis of cervical region 03/08/2021   Viral pericarditis    Alcohol use disorder, severe, dependence (HCC) 12/06/2020   Chest pain 11/09/2020   Ascending aortic aneurysm (HCC)    Benign essential HTN    Coronary artery calcification seen on CAT scan    Psychoactive substance-induced psychosis (HCC)    Rib pain 07/01/2017   Closed fracture of one rib of left side 07/01/2017   Chronic right shoulder pain 05/04/2017   Osteoarthritis of right hip 12/02/2016   Lumbar radiculopathy 04/17/2014   Chronic low back pain 08/16/2013   Lumbar post-laminectomy syndrome 08/16/2013   Lumbosacral radiculitis 08/16/2013   Impingement syndrome of right shoulder 07/05/2013   Empyema lung (HCC)    Abscess of lung(513.0)    Bipolar  depression (HCC)    ADD (attention deficit disorder)    Hepatitis C    Hip pain 05/02/2011   History of total hip arthroplasty 04/23/2011   Lung mass 10/22/2010   Pleural effusion 10/15/2010    PCP: Alveria Apley, NP   REFERRING PROVIDER: London Sheer, MD   REFERRING DIAG: M54.41 (ICD-10-CM) - Acute midline low back pain with right-sided sciatica   Rationale for Evaluation and Treatment: Rehabilitation  THERAPY DIAG:  Other low back pain  Muscle weakness (generalized)  Difficulty in walking, not elsewhere classified  ONSET DATE: 3 month onset of back pain  SUBJECTIVE:                                                                                                                                                                                            SUBJECTIVE STATEMENT: He relays pain stays around 7/10 today woke up with more neck pain so he would like exercises to do for this. Overall he has felt like PT has been helpful and he is not getting the shooting pains down his leg. He would like to come one time a week for 4 more weeks. PERTINENT HISTORY:  MRI showing L1/2 disc herniation, L1/2 central stenosis, L2/3 central lateral recess stenosis. Previous laminectomy/disectomy 2012 L3-5, shoulder arthroscopy 2014, bilat hip replacement/resurfacing  PAIN:  Are you having pain? Yes: NPRS scale: 7/10 Pain location: low back in center today  Pain description: ache, N/T Aggravating factors: standing, walking, moving Relieving factors: sitting  PRECAUTIONS: disc herniation  WEIGHT BEARING RESTRICTIONS: No  FALLS:  Has patient fallen in last 6 months? No   PLOF: Independent  PATIENT GOALS: play basketball again  NEXT MD VISIT:   OBJECTIVE:   DIAGNOSTIC FINDINGS:  MRI of the lumbar spine from 10/01/2022 was independently reviewed and interpreted by Dr Christell Constant, "showing L1/2 disc herniation with caudal migration on the right side.  There is central stenosis at that level as well.  There is central and lateral recess stenosis at L2/3.  Mild foraminal stenosis bilaterally at L1/2 and L2/3.  Prior hemilaminotomy at L3/4 and L4/5. "  PATIENT SURVEYS:  Eval: FOTO 36% functional, goal is 56% 11/27/22: FOTO improved to 47%  SCREENING FOR RED FLAGS: Bowel or bladder incontinence: No Cauda equina syndrome: No   COGNITION: Overall cognitive status: Within functional limits for tasks assessed     SENSATION: WFL  MUSCLE LENGTH: Eval Hamstrings: tight bilat Thomas test: tight bilat, Rt is more tight  POSTURE:   PALPATION:   LUMBAR ROM:   AROM eval 11/20/22 11/27/22  Flexion WNL* WNL WNL but pain  Extension  50% Feels good 75% 75%  and no pain  Right lateral flexion 50%* 75% WNL  Left lateral flexion 50%* 75% WNL  Right rotation 50%* 75% WNL  Left rotation 50%* 75% WNL   (Blank rows = not tested) * denotes pain  LOWER EXTREMITY ROM:      Right eval Left eval  Hip flexion    Hip extension    Hip abduction    Hip adduction    Hip internal rotation    Hip external rotation    Knee flexion    Knee extension    Ankle dorsiflexion    Ankle plantarflexion    Ankle inversion    Ankle eversion     (Blank rows = not tested)  LOWER EXTREMITY MMT:    MMT Right eval Left eval R/L 11/20/22  Hip flexion 4 4 4+/4+  Hip extension     Hip abduction 4 4 5/5  Hip adduction     Hip internal rotation     Hip external rotation     Knee flexion 5 5   Knee extension 5 5   Ankle dorsiflexion 5 5   Ankle plantarflexion     Ankle inversion     Ankle eversion      (Blank rows = not tested)  LUMBAR SPECIAL TESTS:  Slump test: Negative but had recent injection  FUNCTIONAL TESTS:  Eval Pain noted with sit to stand, needed UE support to push up  GAIT: Eval Comments: independent community Ambulator but pain with prolonged walking    TODAY'S TREATMENT:  11/27/22 Nu step L6 X 5 min UE/LE Seated lat pull machine 25# 2X15 Seated row machine 25# 2 X 15 Seated chest press machine 15# 2X15 Hip abduction green X 15 bilat Hip extension green X 15 bilat Updated measurements and goals, FOTO update  Mechanical lumbar traction 90-70# X 10 min  He defers DN today  11/25/22 Nu step L6 X 5 min UE/LE Standing lumbar extensions X10 at wall Seated lumbar stretch pball roll outs 5 sec X 10 Standing rows and extensions, blue band X 20 each Seated lat pull machine 25# 2X15 Seated row machine 25# X 15 Seated chest press machine 15# 2X15 Wall squats with ball on wall X 20 Supine hip flexor stretch 30 sec X 3 bilat  Mechanical lumbar traction 90-70# X 10 min  Manual therapy for skilled palpation and Trigger Point  Dry-Needling  Treatment instructions: Expect mild to moderate muscle soreness. Patient Consent Given: Yes Education handout provided: verbally provided Muscles treated: Cervical-and lumbar paraspinals/multifidi,  Treatment response/outcome: good overall tolerance,twitch response noted    11/20/22 Sci fit bike LE/UE L5 X 5 min Standing lumbar extensions X10 at wall Seated lumbar stretch pball roll outs 5 sec X 10 Standing rows and extensions, blue band X 20 each Standing hip abduction, extension, marches X 20 each bilat with green band Supine hip flexor stretch 30 sec X 3 bilat  Mechanical lumbar traction 90-70# X 10 min   PATIENT EDUCATION: Education details: HEP, PT plan of care Person educated: Patient Education method: Explanation, Demonstration, Verbal cues, and Handouts Education comprehension: verbalized understanding and needs further education   HOME EXERCISE PROGRAM: Access Code: [email or web address] URL: [email or web address] Date: 11/27/2022 Prepared by: Ivery Quale  Exercises - Standing Lumbar Extension  - 5 x daily - 6 x weekly - 1-2 sets - 10 reps - 5 hold - Supine Piriformis Stretch with Foot on Ground  - 2 x daily - 6 x weekly -  1 sets - 3 reps - 30 hold - Supine Bridge  - 2 x daily - 6 x weekly - 1-2 sets - 10 reps - 5 hold - Standing Hip Flexion with Resistance Loop  - 2 x daily - 6 x weekly - 2 sets - 10-15 reps - Hip Abduction with Resistance Loop  - 2 x daily - 6 x weekly - 2 sets - 10-15 reps - Modified Thomas Stretch  - 2 x daily - 6 x weekly - 3-5 sets - 20-30 hold - Seated Cervical Retraction  - 1 x daily - 6 x weekly - 1-2 sets - 10 reps - Seated Assisted Cervical Rotation with Towel  - 1 x daily - 6 x weekly - 1 sets - 10 reps - 5 hold - Mid-Lower Cervical Extension SNAG with Strap  - 1 x daily - 6 x weekly - 1 sets - 10 reps - Supine Suboccipital Release with Tennis Balls  - 2 x daily - 6 x weekly - 1 sets - 3-5 min hold - Seated Isometric  Cervical Sidebending  - 2 x daily - 6 x weekly - 1 sets - 10 reps - 5 hold - Seated Isometric Cervical Extension  - 2 x daily - 6 x weekly - 1 sets - 10 reps - 6 hold - Seated Isometric Cervical Flexion  - 2 x daily - 6 x weekly - 1 sets - 10 reps - 6 hold  ASSESSMENT:  CLINICAL IMPRESSION: He has attended 9 PT sessions and has made functional progress however he is still limited by pain and has not yet met his PT goals. PT recommending 1 time per week for another 4 weeks to continue to work on strength and pain management with ADL's. I did provide him with additional neck exercises as well due to his complaints of neck pain today.  OBJECTIVE IMPAIRMENTS: decreased activity tolerance, difficulty walking, decreased balance, decreased endurance, decreased mobility, decreased ROM, decreased strength, impaired flexibility, impaired UE/LE use, postural dysfunction, and pain.  ACTIVITY LIMITATIONS: bending, lifting, carry, locomotion, cleaning, community activity, driving, and or occupation  PERSONAL FACTORS: Previous laminectomy/disectomy 2012 L3-5, shoulder arthroscopy 2014, bilat hip replacement/resurfacing are also affecting patient's functional outcome.  REHAB POTENTIAL: Fair    CLINICAL DECISION MAKING: Stable/uncomplicated  EVALUATION COMPLEXITY: Low    GOALS: Short term PT Goals Target date: 11/24/2022   Pt will be I and compliant with HEP. Baseline:  Goal status: MET 11/27/22 Pt will decrease pain by 25% overall Baseline:8 Goal status: MET 11/27/22  Long term PT goals Target date:01/01/2023   Pt will improve lumbar ROM to [facility] without complaints to improve functional mobility Baseline: Goal status: MET 11/27/22 Pt will improve bilat hip strength to at least 4+/5 MMT to improve functional strength Baseline: Goal status: MET 11/27/22 Pt will improve FOTO to at least 56% functional to show improved function Baseline:8 Goal status: ongoing 11/04/22 Pt will reduce pain by overall 50%  overall with usual activity Baseline: Goal status: ongoing 11/27/22 Pt will be able to ambulate community distances at least 1000 ft WNL gait pattern without complaints Baseline: Goal status: ongoing 11/27/22  PLAN: PT FREQUENCY: 1 times per week   PT DURATION: 4 weeks  PLANNED INTERVENTIONS (unless contraindicated): aquatic PT, Canalith repositioning, cryotherapy, Electrical stimulation, Iontophoresis with 4 mg/ml dexamethasome, Moist heat, traction, Ultrasound, gait training, Therapeutic exercise, balance training, neuromuscular re-education, patient/family education, prosthetic training, manual techniques, passive ROM, dry needling, taping, vasopnuematic device, vestibular, spinal manipulations, joint manipulations  PLAN FOR NEXT SESSION:  he has one more visit left so reassess plan of care  April Manson, PT,DPT 11/27/2022, 11:16 AM

## 2022-12-02 ENCOUNTER — Telehealth: Payer: Self-pay | Admitting: Orthopedic Surgery

## 2022-12-02 MED ORDER — TRAMADOL HCL 50 MG PO TABS: 50.0000 mg | ORAL_TABLET | Freq: Two times a day (BID) | ORAL | 0 refills | Status: AC | PRN

## 2022-12-02 NOTE — Addendum Note (Signed)
Addended by: Willia Craze on: 12/02/2022 03:19 PM   Modules accepted: Orders

## 2022-12-02 NOTE — Telephone Encounter (Signed)
Patient asking to have his Tramadol refilled. Please advise

## 2022-12-10 ENCOUNTER — Telehealth: Payer: Self-pay | Admitting: Internal Medicine

## 2022-12-10 ENCOUNTER — Encounter: Payer: PPO | Admitting: Surgery

## 2022-12-10 NOTE — Telephone Encounter (Signed)
I called the patient back and adv that due to the aortic aneurysm he should avoid any antibiotics in the quinolone class.  Pt voices understanding.

## 2022-12-10 NOTE — Telephone Encounter (Signed)
Pt would like a callback regarding which antibiotic medication is he not supposed to take because of the aortic aneurysm?. He stated another MD is supposed to prescribe him an antibiotic but he wants to make sure its something he can take. Please advise

## 2022-12-11 ENCOUNTER — Ambulatory Visit (INDEPENDENT_AMBULATORY_CARE_PROVIDER_SITE_OTHER): Payer: PPO | Admitting: Physical Therapy

## 2022-12-11 ENCOUNTER — Encounter: Payer: Self-pay | Admitting: Physical Therapy

## 2022-12-11 DIAGNOSIS — R262 Difficulty in walking, not elsewhere classified: Secondary | ICD-10-CM

## 2022-12-11 DIAGNOSIS — M5459 Other low back pain: Secondary | ICD-10-CM | POA: Diagnosis not present

## 2022-12-11 DIAGNOSIS — M6281 Muscle weakness (generalized): Secondary | ICD-10-CM

## 2022-12-11 NOTE — Therapy (Signed)
OUTPATIENT PHYSICAL THERAPY TREATMENT  Patient Name: Paul Ray MRN: [id number] DOB:Mar 03, 1956, 67 y.o., male Today's Date: 12/11/2022  END OF SESSION:  PT End of Session - 12/11/22 0849     Visit Number 10    Number of Visits 15    Date for PT Re-Evaluation 01/01/23    Progress Note Due on Visit 19    PT Start Time 0845    PT Stop Time 0930    PT Time Calculation (min) 45 min    Activity Tolerance Patient tolerated treatment well    Behavior During Therapy [facility] for tasks assessed/performed                Past Medical History:  Diagnosis Date   Abscess of lung(513.0)    Right lower lobe   ADD (attention deficit disorder)    Anxiety    Arthritis    Ascending aortic aneurysm (HCC)    47mm by Chest CTA 05/2020 and 48mm by echo 05/2020   Benign essential HTN    Bipolar depression (HCC)    Coronary artery calcification seen on CAT scan    Empyema lung (HCC)    Hemorrhoids    Hepatitis C    Pneumonia    last year   Prostate abscess    Viral pericarditis    after COVID 19 infection   Past Surgical History:  Procedure Laterality Date   ELBOW SURGERY  2005   Right   FOOT NEUROMA SURGERY  2006   Left   HIP RESURFACING     left hip [facility] 01/2011   LUMBAR LAMINECTOMY/DECOMPRESSION MICRODISCECTOMY  07/24/2011   Procedure: LUMBAR LAMINECTOMY/DECOMPRESSION MICRODISCECTOMY;  Surgeon: Tia Alert;  Location: [facility];  Service: Neurosurgery;  Laterality: Bilateral;  Bilateral  , Lumbar Three-Four, Lumbar Four-Five Decompressive Laminectomy Rm # 32   LUNG SURGERY     for empyema   SHOULDER ARTHROSCOPY Right 07/05/2013   Procedure: RIGHT SHOULDER ARTHROSCOPY WITH DEBRIDEMENT;  Surgeon: Kathryne Hitch, MD;  Location: [facility] OR;  Service: Orthopedics;  Laterality: Right;   TENDON TRANSFER Left 01/26/2014   Procedure: LEFT THUMB TRAPEZIECTOMY KNOTTED TENDON INTRAPOSITION TRANSFER OF ABDUCTOR POLLICIS LONGUS TO THENARS;  Surgeon: Wyn Forster, MD;  Location:  [facility];  Service: Orthopedics;  Laterality: Left;   THORACOTOMY  November 14 2010   rt   Patient Active Problem List   Diagnosis Date Noted   Protrusion of lumbar intervertebral disc 09/23/2022   History of lumbar laminectomy for spinal cord decompression 01/08/2022   Spinal stenosis of lumbar region 07/25/2021   Foraminal stenosis of cervical region 03/08/2021   Viral pericarditis    Alcohol use disorder, severe, dependence (HCC) 12/06/2020   Chest pain 11/09/2020   Ascending aortic aneurysm (HCC)    Benign essential HTN    Coronary artery calcification seen on CAT scan    Psychoactive substance-induced psychosis (HCC)    Rib pain 07/01/2017   Closed fracture of one rib of left side 07/01/2017   Chronic right shoulder pain 05/04/2017   Osteoarthritis of right hip 12/02/2016   Lumbar radiculopathy 04/17/2014   Chronic low back pain 08/16/2013   Lumbar post-laminectomy syndrome 08/16/2013   Lumbosacral radiculitis 08/16/2013   Impingement syndrome of right shoulder 07/05/2013   Empyema lung (HCC)    Abscess of lung(513.0)    Bipolar depression (HCC)    ADD (attention deficit disorder)    Hepatitis C    Hip pain 05/02/2011   History  of total hip arthroplasty 04/23/2011   Lung mass 10/22/2010   Pleural effusion 10/15/2010    PCP: Alveria Apley, NP   REFERRING PROVIDER: London Sheer, MD   REFERRING DIAG: M54.41 (ICD-10-CM) - Acute midline low back pain with right-sided sciatica   Rationale for Evaluation and Treatment: Rehabilitation  THERAPY DIAG:  Other low back pain  Muscle weakness (generalized)  Difficulty in walking, not elsewhere classified  ONSET DATE: 3 month onset of back pain  SUBJECTIVE:                                                                                                                                                                                           SUBJECTIVE STATEMENT: He relays the pain is just overall  worse lately and not sure why. He is getting frustrated with this PERTINENT HISTORY:  MRI showing L1/2 disc herniation, L1/2 central stenosis, L2/3 central lateral recess stenosis. Previous laminectomy/disectomy 2012 L3-5, shoulder arthroscopy 2014, bilat hip replacement/resurfacing  PAIN:  Are you having pain? Yes: NPRS scale: 9/10 Pain location: low back and neck  Pain description: ache, N/T Aggravating factors: standing, walking, moving Relieving factors: sitting  PRECAUTIONS: disc herniation  WEIGHT BEARING RESTRICTIONS: No  FALLS:  Has patient fallen in last 6 months? No   PLOF: Independent  PATIENT GOALS: play basketball again  NEXT MD VISIT:   OBJECTIVE:   DIAGNOSTIC FINDINGS:  MRI of the lumbar spine from 10/01/2022 was independently reviewed and interpreted by Dr Christell Constant, "showing L1/2 disc herniation with caudal migration on the right side.  There is central stenosis at that level as well.  There is central and lateral recess stenosis at L2/3.  Mild foraminal stenosis bilaterally at L1/2 and L2/3.  Prior hemilaminotomy at L3/4 and L4/5. "  PATIENT SURVEYS:  Eval: FOTO 36% functional, goal is 56% 11/27/22: FOTO improved to 47%  SCREENING FOR RED FLAGS: Bowel or bladder incontinence: No Cauda equina syndrome: No   COGNITION: Overall cognitive status: Within functional limits for tasks assessed     SENSATION: WFL  MUSCLE LENGTH: Eval Hamstrings: tight bilat Thomas test: tight bilat, Rt is more tight  POSTURE:   PALPATION:   LUMBAR ROM:   AROM eval 11/20/22 11/27/22  Flexion WNL* WNL WNL but pain  Extension 50% Feels good 75% 75% and no pain  Right lateral flexion 50%* 75% WNL  Left lateral flexion 50%* 75% WNL  Right rotation 50%* 75% WNL  Left rotation 50%* 75% WNL   (Blank rows = not tested) * denotes pain  LOWER EXTREMITY ROM:      Right eval Left eval  Hip flexion  Hip extension    Hip abduction    Hip adduction    Hip internal  rotation    Hip external rotation    Knee flexion    Knee extension    Ankle dorsiflexion    Ankle plantarflexion    Ankle inversion    Ankle eversion     (Blank rows = not tested)  LOWER EXTREMITY MMT:    MMT Right eval Left eval R/L 11/20/22  Hip flexion 4 4 4+/4+  Hip extension     Hip abduction 4 4 5/5  Hip adduction     Hip internal rotation     Hip external rotation     Knee flexion 5 5   Knee extension 5 5   Ankle dorsiflexion 5 5   Ankle plantarflexion     Ankle inversion     Ankle eversion      (Blank rows = not tested)  LUMBAR SPECIAL TESTS:  Slump test: Negative but had recent injection  FUNCTIONAL TESTS:  Eval Pain noted with sit to stand, needed UE support to push up  GAIT: Eval Comments: independent community Ambulator but pain with prolonged walking    TODAY'S TREATMENT:  12/11/22 Recumbent bike L4 X 5 min Standing rows blue X 20 Standing shoulder extensions blue X 20 Seated lat pull machine 25# 2X15 Seated row machine 25# X 20 Seated chest press machine 15# 2X15 Hip abduction green X 15 bilat Hip extension green X 15 bilat Deadlift from 8 inch step to waist 15# X 15, cues and demo on body mechanics  Mechanical lumbar traction 90-70# X 10 min  He defers DN today  11/27/22 Nu step L6 X 5 min UE/LE Seated lat pull machine 25# 2X15 Seated row machine 25# 2 X 15 Seated chest press machine 15# 2X15 Hip abduction green X 15 bilat Hip extension green X 15 bilat Updated measurements and goals, FOTO update  Mechanical lumbar traction 90-70# X 10 min  He defers DN today  11/25/22 Nu step L6 X 5 min UE/LE Standing lumbar extensions X10 at wall Seated lumbar stretch pball roll outs 5 sec X 10 Standing rows and extensions, blue band X 20 each Seated lat pull machine 25# 2X15 Seated row machine 25# X 15 Seated chest press machine 15# 2X15 Wall squats with ball on wall X 20 Supine hip flexor stretch 30 sec X 3 bilat  Mechanical lumbar  traction 90-70# X 10 min  Manual therapy for skilled palpation and Trigger Point Dry-Needling  Treatment instructions: Expect mild to moderate muscle soreness. Patient Consent Given: Yes Education handout provided: verbally provided Muscles treated: Cervical-and lumbar paraspinals/multifidi,  Treatment response/outcome: good overall tolerance,twitch response noted    11/20/22 Sci fit bike LE/UE L5 X 5 min Standing lumbar extensions X10 at wall Seated lumbar stretch pball roll outs 5 sec X 10 Standing rows and extensions, blue band X 20 each Standing hip abduction, extension, marches X 20 each bilat with green band Supine hip flexor stretch 30 sec X 3 bilat  Mechanical lumbar traction 90-70# X 10 min   PATIENT EDUCATION: Education details: HEP, PT plan of care Person educated: Patient Education method: Explanation, Demonstration, Verbal cues, and Handouts Education comprehension: verbalized understanding and needs further education   HOME EXERCISE PROGRAM: Access Code: [email or web address] URL: [email or web address] Date: 11/27/2022 Prepared by: Ivery Quale  Exercises - Standing Lumbar Extension  - 5 x daily - 6 x weekly - 1-2 sets - 10 reps - 5 hold - Supine Piriformis  Stretch with Foot on Ground  - 2 x daily - 6 x weekly - 1 sets - 3 reps - 30 hold - Supine Bridge  - 2 x daily - 6 x weekly - 1-2 sets - 10 reps - 5 hold - Standing Hip Flexion with Resistance Loop  - 2 x daily - 6 x weekly - 2 sets - 10-15 reps - Hip Abduction with Resistance Loop  - 2 x daily - 6 x weekly - 2 sets - 10-15 reps - Modified Thomas Stretch  - 2 x daily - 6 x weekly - 3-5 sets - 20-30 hold - Seated Cervical Retraction  - 1 x daily - 6 x weekly - 1-2 sets - 10 reps - Seated Assisted Cervical Rotation with Towel  - 1 x daily - 6 x weekly - 1 sets - 10 reps - 5 hold - Mid-Lower Cervical Extension SNAG with Strap  - 1 x daily - 6 x weekly - 1 sets - 10 reps - Supine Suboccipital Release with  Tennis Balls  - 2 x daily - 6 x weekly - 1 sets - 3-5 min hold - Seated Isometric Cervical Sidebending  - 2 x daily - 6 x weekly - 1 sets - 10 reps - 5 hold - Seated Isometric Cervical Extension  - 2 x daily - 6 x weekly - 1 sets - 10 reps - 6 hold - Seated Isometric Cervical Flexion  - 2 x daily - 6 x weekly - 1 sets - 10 reps - 6 hold  ASSESSMENT:  CLINICAL IMPRESSION: He had higher pain levels upon arrival today but was still able to complete his exercises with good tolerance. We did continue with lumbar traction as this does typically provide him with some pain relief. I did encourage him to make follow up appointment with MD due to continued pain levels.   OBJECTIVE IMPAIRMENTS: decreased activity tolerance, difficulty walking, decreased balance, decreased endurance, decreased mobility, decreased ROM, decreased strength, impaired flexibility, impaired UE/LE use, postural dysfunction, and pain.  ACTIVITY LIMITATIONS: bending, lifting, carry, locomotion, cleaning, community activity, driving, and or occupation  PERSONAL FACTORS: Previous laminectomy/disectomy 2012 L3-5, shoulder arthroscopy 2014, bilat hip replacement/resurfacing are also affecting patient's functional outcome.  REHAB POTENTIAL: Fair    CLINICAL DECISION MAKING: Stable/uncomplicated  EVALUATION COMPLEXITY: Low    GOALS: Short term PT Goals Target date: 11/24/2022   Pt will be I and compliant with HEP. Baseline:  Goal status: MET 11/27/22 Pt will decrease pain by 25% overall Baseline:8 Goal status: MET 11/27/22  Long term PT goals Target date:01/01/2023   Pt will improve lumbar ROM to [facility] without complaints to improve functional mobility Baseline: Goal status: MET 11/27/22 Pt will improve bilat hip strength to at least 4+/5 MMT to improve functional strength Baseline: Goal status: MET 11/27/22 Pt will improve FOTO to at least 56% functional to show improved function Baseline:8 Goal status: ongoing 11/04/22 Pt will  reduce pain by overall 50% overall with usual activity Baseline: Goal status: ongoing 11/27/22 Pt will be able to ambulate community distances at least 1000 ft WNL gait pattern without complaints Baseline: Goal status: ongoing 11/27/22  PLAN: PT FREQUENCY: 1 times per week   PT DURATION: 4 weeks  PLANNED INTERVENTIONS (unless contraindicated): aquatic PT, Canalith repositioning, cryotherapy, Electrical stimulation, Iontophoresis with 4 mg/ml dexamethasome, Moist heat, traction, Ultrasound, gait training, Therapeutic exercise, balance training, neuromuscular re-education, patient/family education, prosthetic training, manual techniques, passive ROM, dry needling, taping, vasopnuematic device, vestibular, spinal manipulations, joint  manipulations  PLAN FOR NEXT SESSION:  Traction and or DN if desired, functional strengthening as tolerated.  April Manson, PT,DPT 12/11/2022, 8:49 AM

## 2022-12-16 ENCOUNTER — Encounter: Payer: Self-pay | Admitting: Family Medicine

## 2022-12-16 ENCOUNTER — Ambulatory Visit (INDEPENDENT_AMBULATORY_CARE_PROVIDER_SITE_OTHER): Payer: PPO | Admitting: Family Medicine

## 2022-12-16 ENCOUNTER — Other Ambulatory Visit: Payer: Self-pay | Admitting: Behavioral Health

## 2022-12-16 ENCOUNTER — Telehealth: Payer: Self-pay | Admitting: Behavioral Health

## 2022-12-16 VITALS — BP 120/78 | HR 71 | Temp 98.7°F | Ht 67.0 in | Wt 164.1 lb

## 2022-12-16 DIAGNOSIS — F3160 Bipolar disorder, current episode mixed, unspecified: Secondary | ICD-10-CM

## 2022-12-16 DIAGNOSIS — F411 Generalized anxiety disorder: Secondary | ICD-10-CM

## 2022-12-16 DIAGNOSIS — F331 Major depressive disorder, recurrent, moderate: Secondary | ICD-10-CM

## 2022-12-16 DIAGNOSIS — Z0000 Encounter for general adult medical examination without abnormal findings: Secondary | ICD-10-CM | POA: Diagnosis not present

## 2022-12-16 DIAGNOSIS — F902 Attention-deficit hyperactivity disorder, combined type: Secondary | ICD-10-CM

## 2022-12-16 MED ORDER — AMPHETAMINE-DEXTROAMPHETAMINE 20 MG PO TABS
20.0000 mg | ORAL_TABLET | Freq: Every day | ORAL | 0 refills | Status: DC
Start: 2022-12-20 — End: 2023-01-20

## 2022-12-16 MED ORDER — AMPHETAMINE-DEXTROAMPHET ER 30 MG PO CP24
ORAL_CAPSULE | ORAL | 0 refills | Status: DC
Start: 2022-12-20 — End: 2023-01-20

## 2022-12-16 NOTE — Progress Notes (Signed)
Complete physical exam  Patient: Paul Ray   DOB: 21-May-1956   67 y.o. Male  MRN: [id number]  Subjective:    Chief Complaint  Patient presents with   Annual Exam    Pt is here today annual Physical. Pt is FASTING Pt reports he has had staph infection and being treated.     Paul Ray is a 68 y.o. male who presents today for a complete physical exam. He reports consuming a low sodium diet, and try low sugar..  Patient reports he walks everyday, for about 2 miles and plays basketball once a week.  He generally feels well. He reports sleeping somewhat good. He does not have additional problems to discuss today.   Most recent fall risk assessment:    12/16/2022    7:49 AM  Fall Risk   Falls in the past year? 0  Number falls in past yr: 0  Injury with Fall? 0  Risk for fall due to : No Fall Risks  Follow up Falls evaluation completed     Most recent depression screenings:    12/16/2022    7:54 AM 10/07/2022    7:59 AM  PHQ 2/9 Scores  PHQ - 2 Score 0 0  PHQ- 9 Score 0 0    Vision:Not within last year  and Dental: No current dental problems and Receives regular dental care  Patient Active Problem List   Diagnosis Date Noted   Protrusion of lumbar intervertebral disc 09/23/2022   History of lumbar laminectomy for spinal cord decompression 01/08/2022   Spinal stenosis of lumbar region 07/25/2021   Foraminal stenosis of cervical region 03/08/2021   Viral pericarditis    Alcohol use disorder, severe, dependence (HCC) 12/06/2020   Chest pain 11/09/2020   Ascending aortic aneurysm (HCC)    Benign essential HTN    Coronary artery calcification seen on CAT scan    Psychoactive substance-induced psychosis (HCC)    Rib pain 07/01/2017   Closed fracture of one rib of left side 07/01/2017   Chronic right shoulder pain 05/04/2017   Osteoarthritis of right hip 12/02/2016   Lumbar radiculopathy 04/17/2014   Chronic low back pain 08/16/2013   Lumbar post-laminectomy syndrome  08/16/2013   Lumbosacral radiculitis 08/16/2013   Impingement syndrome of right shoulder 07/05/2013   Empyema lung (HCC)    Abscess of lung(513.0)    Bipolar depression (HCC)    ADD (attention deficit disorder)    Hepatitis C    Hip pain 05/02/2011   History of total hip arthroplasty 04/23/2011   Lung mass 10/22/2010   Pleural effusion 10/15/2010   Past Medical History:  Diagnosis Date   Abscess of lung(513.0)    Right lower lobe   ADD (attention deficit disorder)    Anxiety    Arthritis    Ascending aortic aneurysm (HCC)    47mm by Chest CTA 05/2020 and 48mm by echo 05/2020   Benign essential HTN    Bipolar depression (HCC)    Coronary artery calcification seen on CAT scan    Empyema lung (HCC)    Hemorrhoids    Hepatitis C    Pneumonia    last year   Prostate abscess    Staph infection    Viral pericarditis    after COVID 19 infection   Past Surgical History:  Procedure Laterality Date   ELBOW SURGERY  2005   Right   FOOT NEUROMA SURGERY  2006   Left   HIP RESURFACING  left hip at [facility] 01/2011   LUMBAR LAMINECTOMY/DECOMPRESSION MICRODISCECTOMY  07/24/2011   Procedure: LUMBAR LAMINECTOMY/DECOMPRESSION MICRODISCECTOMY;  Surgeon: Tia Alert;  Location: [facility];  Service: Neurosurgery;  Laterality: Bilateral;  Bilateral  , Lumbar Three-Four, Lumbar Four-Five Decompressive Laminectomy Rm # 32   LUNG SURGERY     for empyema   SHOULDER ARTHROSCOPY Right 07/05/2013   Procedure: RIGHT SHOULDER ARTHROSCOPY WITH DEBRIDEMENT;  Surgeon: Kathryne Hitch, MD;  Location: [facility] OR;  Service: Orthopedics;  Laterality: Right;   TENDON TRANSFER Left 01/26/2014   Procedure: LEFT THUMB TRAPEZIECTOMY KNOTTED TENDON INTRAPOSITION TRANSFER OF ABDUCTOR POLLICIS LONGUS TO THENARS;  Surgeon: Wyn Forster, MD;  Location: [facility];  Service: Orthopedics;  Laterality: Left;   THORACOTOMY  November 14 2010   rt   Social History   Tobacco Use   Smoking  status: Never   Smokeless tobacco: Never  Vaping Use   Vaping Use: Never used  Substance Use Topics   Alcohol use: No    Comment: quit drinking in 1989   Drug use: Not Currently    Types: Hydrocodone    Comment: Prior heavy use of opioid pain medications up until 2 years ago   Social History   Socioeconomic History   Marital status: Single    Spouse name: Not on file   Number of children: 2   Years of education: 12   Highest education level: High school graduate  Occupational History   Occupation: Music therapist  Tobacco Use   Smoking status: Never   Smokeless tobacco: Never  Vaping Use   Vaping Use: Never used  Substance and Sexual Activity   Alcohol use: No    Comment: quit drinking in 1989   Drug use: Not Currently    Types: Hydrocodone    Comment: Prior heavy use of opioid pain medications up until 2 years ago   Sexual activity: Yes  Other Topics Concern   Not on file  Social History Narrative   2 children: Fleet Contras and [name]   Lives by himself, no pets.    Social Determinants of Health   Financial Resource Strain: Not on file  Food Insecurity: Not on file  Transportation Needs: Not on file  Physical Activity: Not on file  Stress: Not on file  Social Connections: Not on file  Intimate Partner Violence: Not on file   Family Status  Relation Name Status   Mother  Deceased at age 57   Father  Deceased at age 44   Brother  (Not Specified)   MGM  (Not Specified)   PGF  (Not Specified)   PGM  (Not Specified)   Family History  Problem Relation Age of Onset   COPD Mother    Heart disease Mother    Hypertension Mother    Coronary artery disease Father    Dementia Father    Heart failure Father    Schizophrenia Brother    Breast cancer Maternal Grandmother    Prostate cancer Paternal Grandfather        also had bone cancer   Cancer Paternal Grandmother        unsure what kind   Allergies  Allergen Reactions   Quinolones Other (See Comments)    Patient was  warned about not using Cipro and similar antibiotics. Recent studies have raised concern that fluoroquinolone antibiotics could be associated with an increased risk of aortic aneurysm Fluoroquinolones have non-antimicrobial properties that might jeopardise the integrity of the extracellular  matrix of the vascular wall In a  propensity score matched cohort study in [place], there was a 66% increased rate of aortic aneurysm or dissection associated with oral fluoroquinolone use, compared wit    Patient Care Team: Alveria Apley, NP as PCP - General (Family Medicine) Orbie Pyo, MD as PCP - Cardiology (Cardiology) Gastroenterology, Lowella Fairy, Tyrone Apple, MD as Consulting Physician (Orthopedic Surgery) Joan Flores, NP as Nurse Practitioner (Behavioral Health) Crist Fat, MD as Attending Physician (Urology) Quintella Reichert, MD as Consulting Physician (Cardiology) Arminda Resides, MD as Consulting Physician (Dermatology)   Outpatient Medications Prior to Visit  Medication Sig   ALPRAZolam (XANAX) 1 MG tablet TAKE 1 TABLET BY MOUTH 3 TIMES A DAY AS NEEDED FOR SLEEP   amphetamine-dextroamphetamine (ADDERALL XR) 30 MG 24 hr capsule Take 1 capsule (30 mg) by mouth every morning.   aspirin EC 81 MG tablet Take 1 tablet (81 mg total) by mouth daily. Swallow whole.   atorvastatin (LIPITOR) 20 MG tablet Take 1 tablet (20 mg total) by mouth daily.   cephALEXin (KEFLEX) 500 MG capsule Take 500 mg by mouth 3 (three) times daily.   lamoTRIgine (LAMICTAL) 200 MG tablet Take 1 tablet (200 mg total) by mouth in the morning.   metoprolol succinate (TOPROL XL) 25 MG 24 hr tablet Take 1 tablet (25 mg total) by mouth at bedtime.   silodosin (RAPAFLO) 8 MG CAPS capsule Take 8 mg by mouth daily.   VRAYLAR 1.5 MG capsule Take 1.5 mg by mouth daily.   [DISCONTINUED] amphetamine-dextroamphetamine (ADDERALL) 20 MG tablet Take 1 tablet (20 mg) by mouth in the afternoon.   [DISCONTINUED] VRAYLAR 1.5  MG capsule Take 1.5 mg by mouth daily. (Patient not taking: Reported on 12/16/2022)   No facility-administered medications prior to visit.   ROS See HPI above     Objective:   BP 120/78   Pulse 71   Temp 98.7 F (37.1 C)   Ht 5\' 7"  (1.702 m)   Wt 164 lb 2 oz (74.4 kg)   SpO2 98%   BMI 25.71 kg/m      Physical Exam Vitals reviewed.  Constitutional:      General: He is not in acute distress.    Appearance: Normal appearance. He is not ill-appearing or toxic-appearing.  HENT:     Head: Normocephalic and atraumatic.     Right Ear: Tympanic membrane, ear canal and external ear normal. There is no impacted cerumen.     Left Ear: Tympanic membrane, ear canal and external ear normal. There is no impacted cerumen.     Nose:     Right Sinus: No maxillary sinus tenderness or frontal sinus tenderness.     Left Sinus: No maxillary sinus tenderness or frontal sinus tenderness.     Mouth/Throat:     Mouth: Mucous membranes are moist.     Pharynx: Oropharynx is clear. Uvula midline. No pharyngeal swelling, oropharyngeal exudate, posterior oropharyngeal erythema or uvula swelling.  Eyes:     General:        Right eye: No discharge.        Left eye: No discharge.     Conjunctiva/sclera: Conjunctivae normal.     Pupils: Pupils are equal, round, and reactive to light.  Neck:     Thyroid: No thyromegaly.  Cardiovascular:     Rate and Rhythm: Normal rate and regular rhythm.     Pulses:          Dorsalis  pedis pulses are 3+ on the right side and 3+ on the left side.     Heart sounds: Normal heart sounds. No murmur heard.    No friction rub. No gallop.  Pulmonary:     Effort: Pulmonary effort is normal. No respiratory distress.     Breath sounds: Normal breath sounds.  Abdominal:     General: Abdomen is flat. Bowel sounds are normal.     Palpations: Abdomen is soft.     Tenderness: There is no right CVA tenderness or left CVA tenderness.  Musculoskeletal:        General: Normal range  of motion.     Cervical back: Normal range of motion.     Right lower leg: No edema.     Left lower leg: No edema.  Lymphadenopathy:     Cervical: No cervical adenopathy.  Skin:    General: Skin is warm and dry.  Neurological:     General: No focal deficit present.     Mental Status: He is alert and oriented to person, place, and time. Mental status is at baseline.     Gait: Gait normal.  Psychiatric:        Mood and Affect: Mood normal.        Behavior: Behavior normal.        Thought Content: Thought content normal.        Judgment: Judgment normal.       Assessment & Plan:    Routine Health Maintenance and Physical Exam  Immunization History  Administered Date(s) Administered   Td 04/19/2020   Tdap 02/11/2018    Health Maintenance  Topic Date Due   Medicare Annual Wellness (AWV)  07/12/2021   Pneumonia Vaccine 36+ Years old (1 of 1 - PCV) 12/20/2022 (Originally 12/13/2021)   Zoster Vaccines- Shingrix (1 of 2) 04/28/2023 (Originally 12/14/1975)   INFLUENZA VACCINE  02/26/2023   COLONOSCOPY (Pts 45-2yrs Insurance coverage will need to be confirmed)  09/03/2027   DTaP/Tdap/Td (3 - Td or Tdap) 04/19/2030   Hepatitis C Screening  Completed   HPV VACCINES  Aged Out   COVID-19 Vaccine  Discontinued    Discussed health benefits of physical activity, and encouraged him to engage in regular exercise appropriate for his age and condition.  Annual physical exam  1.Review health maintenance: -Declines AWV visit.  -Declined Pneumonia Vaccine and Shingrix Vaccine 2. Physical exam completed today. No concerns.  -Labs completed for physical back in March 2024.  Return in about 1 year (around 12/16/2023) for physical.   Zandra Abts, NP

## 2022-12-16 NOTE — Telephone Encounter (Signed)
Pt called in and requested next RF on both Adderall doses, XR 30mg   and 20mg  IR Send to: [facility] [id number] - [place], [place] - [address]

## 2022-12-16 NOTE — Patient Instructions (Signed)
Physical completed today. No concerns.  Follow up in 1 year

## 2022-12-18 ENCOUNTER — Encounter: Payer: Self-pay | Admitting: Physical Therapy

## 2022-12-18 ENCOUNTER — Ambulatory Visit (INDEPENDENT_AMBULATORY_CARE_PROVIDER_SITE_OTHER): Payer: PPO | Admitting: Physical Therapy

## 2022-12-18 DIAGNOSIS — M5459 Other low back pain: Secondary | ICD-10-CM

## 2022-12-18 DIAGNOSIS — M6281 Muscle weakness (generalized): Secondary | ICD-10-CM

## 2022-12-18 DIAGNOSIS — M542 Cervicalgia: Secondary | ICD-10-CM

## 2022-12-18 DIAGNOSIS — R262 Difficulty in walking, not elsewhere classified: Secondary | ICD-10-CM

## 2022-12-18 NOTE — Therapy (Signed)
OUTPATIENT PHYSICAL THERAPY TREATMENT  Patient Name: Paul Ray MRN: [id number] DOB:1956-06-30, 67 y.o., male Today's Date: 12/18/2022  END OF SESSION:  PT End of Session - 12/18/22 0855     Visit Number 11    Number of Visits 15    Date for PT Re-Evaluation 01/01/23    Progress Note Due on Visit 19    PT Start Time 0845    PT Stop Time 0930    PT Time Calculation (min) 45 min    Activity Tolerance Patient tolerated treatment well    Behavior During Therapy [facility] for tasks assessed/performed                Past Medical History:  Diagnosis Date   Abscess of lung(513.0)    Right lower lobe   ADD (attention deficit disorder)    Anxiety    Arthritis    Ascending aortic aneurysm (HCC)    47mm by Chest CTA 05/2020 and 48mm by echo 05/2020   Benign essential HTN    Bipolar depression (HCC)    Coronary artery calcification seen on CAT scan    Empyema lung (HCC)    Hemorrhoids    Hepatitis C    Pneumonia    last year   Prostate abscess    Staph infection    Viral pericarditis    after COVID 19 infection   Past Surgical History:  Procedure Laterality Date   ELBOW SURGERY  2005   Right   FOOT NEUROMA SURGERY  2006   Left   HIP RESURFACING     left hip at [facility] 01/2011   LUMBAR LAMINECTOMY/DECOMPRESSION MICRODISCECTOMY  07/24/2011   Procedure: LUMBAR LAMINECTOMY/DECOMPRESSION MICRODISCECTOMY;  Surgeon: Tia Alert;  Location: [facility];  Service: Neurosurgery;  Laterality: Bilateral;  Bilateral  , Lumbar Three-Four, Lumbar Four-Five Decompressive Laminectomy Rm # 32   LUNG SURGERY     for empyema   SHOULDER ARTHROSCOPY Right 07/05/2013   Procedure: RIGHT SHOULDER ARTHROSCOPY WITH DEBRIDEMENT;  Surgeon: Kathryne Hitch, MD;  Location: [facility] OR;  Service: Orthopedics;  Laterality: Right;   TENDON TRANSFER Left 01/26/2014   Procedure: LEFT THUMB TRAPEZIECTOMY KNOTTED TENDON INTRAPOSITION TRANSFER OF ABDUCTOR POLLICIS LONGUS TO THENARS;  Surgeon: Wyn Forster, MD;  Location: [facility];  Service: Orthopedics;  Laterality: Left;   THORACOTOMY  November 14 2010   rt   Patient Active Problem List   Diagnosis Date Noted   Protrusion of lumbar intervertebral disc 09/23/2022   History of lumbar laminectomy for spinal cord decompression 01/08/2022   Spinal stenosis of lumbar region 07/25/2021   Foraminal stenosis of cervical region 03/08/2021   Viral pericarditis    Alcohol use disorder, severe, dependence (HCC) 12/06/2020   Chest pain 11/09/2020   Ascending aortic aneurysm (HCC)    Benign essential HTN    Coronary artery calcification seen on CAT scan    Psychoactive substance-induced psychosis (HCC)    Rib pain 07/01/2017   Closed fracture of one rib of left side 07/01/2017   Chronic right shoulder pain 05/04/2017   Osteoarthritis of right hip 12/02/2016   Lumbar radiculopathy 04/17/2014   Chronic low back pain 08/16/2013   Lumbar post-laminectomy syndrome 08/16/2013   Lumbosacral radiculitis 08/16/2013   Impingement syndrome of right shoulder 07/05/2013   Empyema lung (HCC)    Abscess of lung(513.0)    Bipolar depression (HCC)    ADD (attention deficit disorder)    Hepatitis C    Hip  pain 05/02/2011   History of total hip arthroplasty 04/23/2011   Lung mass 10/22/2010   Pleural effusion 10/15/2010    PCP: Alveria Apley, NP   REFERRING PROVIDER: London Sheer, MD   REFERRING DIAG: M54.41 (ICD-10-CM) - Acute midline low back pain with right-sided sciatica   Rationale for Evaluation and Treatment: Rehabilitation  THERAPY DIAG:  Other low back pain  Muscle weakness (generalized)  Difficulty in walking, not elsewhere classified  Cervicalgia  ONSET DATE: 3 month onset of back pain  SUBJECTIVE:                                                                                                                                                                                           SUBJECTIVE  STATEMENT: He relays the pain feels better today PERTINENT HISTORY:  MRI showing L1/2 disc herniation, L1/2 central stenosis, L2/3 central lateral recess stenosis. Previous laminectomy/disectomy 2012 L3-5, shoulder arthroscopy 2014, bilat hip replacement/resurfacing  PAIN:  Are you having pain? Yes: NPRS scale: 7/10 Pain location: low back and neck  Pain description: ache, N/T Aggravating factors: standing, walking, moving Relieving factors: sitting  PRECAUTIONS: disc herniation  WEIGHT BEARING RESTRICTIONS: No  FALLS:  Has patient fallen in last 6 months? No   PLOF: Independent  PATIENT GOALS: play basketball again  NEXT MD VISIT:   OBJECTIVE:   DIAGNOSTIC FINDINGS:  MRI of the lumbar spine from 10/01/2022 was independently reviewed and interpreted by Dr Christell Constant, "showing L1/2 disc herniation with caudal migration on the right side.  There is central stenosis at that level as well.  There is central and lateral recess stenosis at L2/3.  Mild foraminal stenosis bilaterally at L1/2 and L2/3.  Prior hemilaminotomy at L3/4 and L4/5. "  PATIENT SURVEYS:  Eval: FOTO 36% functional, goal is 56% 11/27/22: FOTO improved to 47%  SCREENING FOR RED FLAGS: Bowel or bladder incontinence: No Cauda equina syndrome: No   COGNITION: Overall cognitive status: Within functional limits for tasks assessed     SENSATION: WFL  MUSCLE LENGTH: Eval Hamstrings: tight bilat Thomas test: tight bilat, Rt is more tight  POSTURE:   PALPATION:   LUMBAR ROM:   AROM eval 11/20/22 11/27/22  Flexion WNL* WNL WNL but pain  Extension 50% Feels good 75% 75% and no pain  Right lateral flexion 50%* 75% WNL  Left lateral flexion 50%* 75% WNL  Right rotation 50%* 75% WNL  Left rotation 50%* 75% WNL   (Blank rows = not tested) * denotes pain  LOWER EXTREMITY ROM:      Right eval Left eval  Hip flexion    Hip extension  Hip abduction    Hip adduction    Hip internal rotation    Hip  external rotation    Knee flexion    Knee extension    Ankle dorsiflexion    Ankle plantarflexion    Ankle inversion    Ankle eversion     (Blank rows = not tested)  LOWER EXTREMITY MMT:    MMT Right eval Left eval R/L 11/20/22  Hip flexion 4 4 4+/4+  Hip extension     Hip abduction 4 4 5/5  Hip adduction     Hip internal rotation     Hip external rotation     Knee flexion 5 5   Knee extension 5 5   Ankle dorsiflexion 5 5   Ankle plantarflexion     Ankle inversion     Ankle eversion      (Blank rows = not tested)  LUMBAR SPECIAL TESTS:  Slump test: Negative but had recent injection  FUNCTIONAL TESTS:  Eval Pain noted with sit to stand, needed UE support to push up  GAIT: Eval Comments: independent community Ambulator but pain with prolonged walking    TODAY'S TREATMENT:  12/18/22 Recumbent bike L4 X 5 min Deadlift from 8 inch step to waist 15# X 15, cues and demo on body mechanics Sit to stand 15# X 15 Suitcase carry one lap around gym Rt UE, Lt UE, 15# Seated lat pull machine 25# 2X15 Seated row machine 25# 2X15 Seated chest press machine 15# 2X15 Hip abduction green X 15 bilat Hip extension green X 15 bilat Lumbar extensions 3 sec X 10 Supine hip flexor stretch 30 sec X 2 bilat  Mechanical lumbar traction 95-75# X 15 min    PATIENT EDUCATION: Education details: HEP, PT plan of care Person educated: Patient Education method: Explanation, Demonstration, Verbal cues, and Handouts Education comprehension: verbalized understanding and needs further education   HOME EXERCISE PROGRAM: Access Code: [email or web address] URL: [email or web address] Date: 11/27/2022 Prepared by: Ivery Quale  Exercises - Standing Lumbar Extension  - 5 x daily - 6 x weekly - 1-2 sets - 10 reps - 5 hold - Supine Piriformis Stretch with Foot on Ground  - 2 x daily - 6 x weekly - 1 sets - 3 reps - 30 hold - Supine Bridge  - 2 x daily - 6 x weekly - 1-2 sets - 10 reps - 5  hold - Standing Hip Flexion with Resistance Loop  - 2 x daily - 6 x weekly - 2 sets - 10-15 reps - Hip Abduction with Resistance Loop  - 2 x daily - 6 x weekly - 2 sets - 10-15 reps - Modified Thomas Stretch  - 2 x daily - 6 x weekly - 3-5 sets - 20-30 hold - Seated Cervical Retraction  - 1 x daily - 6 x weekly - 1-2 sets - 10 reps - Seated Assisted Cervical Rotation with Towel  - 1 x daily - 6 x weekly - 1 sets - 10 reps - 5 hold - Mid-Lower Cervical Extension SNAG with Strap  - 1 x daily - 6 x weekly - 1 sets - 10 reps - Supine Suboccipital Release with Tennis Balls  - 2 x daily - 6 x weekly - 1 sets - 3-5 min hold - Seated Isometric Cervical Sidebending  - 2 x daily - 6 x weekly - 1 sets - 10 reps - 5 hold - Seated Isometric Cervical Extension  - 2 x daily - 6 x weekly -  1 sets - 10 reps - 6 hold - Seated Isometric Cervical Flexion  - 2 x daily - 6 x weekly - 1 sets - 10 reps - 6 hold  ASSESSMENT:  CLINICAL IMPRESSION: His pain was overall decreased today compared to last time so I did progress his strength program some with good overall tolerance to this noted. We continued with traction at his request. He has 2 more visits scheduled at this time.  OBJECTIVE IMPAIRMENTS: decreased activity tolerance, difficulty walking, decreased balance, decreased endurance, decreased mobility, decreased ROM, decreased strength, impaired flexibility, impaired UE/LE use, postural dysfunction, and pain.  ACTIVITY LIMITATIONS: bending, lifting, carry, locomotion, cleaning, community activity, driving, and or occupation  PERSONAL FACTORS: Previous laminectomy/disectomy 2012 L3-5, shoulder arthroscopy 2014, bilat hip replacement/resurfacing are also affecting patient's functional outcome.  REHAB POTENTIAL: Fair    CLINICAL DECISION MAKING: Stable/uncomplicated  EVALUATION COMPLEXITY: Low    GOALS: Short term PT Goals Target date: 11/24/2022   Pt will be I and compliant with HEP. Baseline:  Goal  status: MET 11/27/22 Pt will decrease pain by 25% overall Baseline:8 Goal status: MET 11/27/22  Long term PT goals Target date:01/01/2023   Pt will improve lumbar ROM to [facility] without complaints to improve functional mobility Baseline: Goal status: MET 11/27/22 Pt will improve bilat hip strength to at least 4+/5 MMT to improve functional strength Baseline: Goal status: MET 11/27/22 Pt will improve FOTO to at least 56% functional to show improved function Baseline:8 Goal status: ongoing 11/04/22 Pt will reduce pain by overall 50% overall with usual activity Baseline: Goal status: ongoing 11/27/22 Pt will be able to ambulate community distances at least 1000 ft WNL gait pattern without complaints Baseline: Goal status: ongoing 11/27/22  PLAN: PT FREQUENCY: 1 times per week   PT DURATION: 4 weeks  PLANNED INTERVENTIONS (unless contraindicated): aquatic PT, Canalith repositioning, cryotherapy, Electrical stimulation, Iontophoresis with 4 mg/ml dexamethasome, Moist heat, traction, Ultrasound, gait training, Therapeutic exercise, balance training, neuromuscular re-education, patient/family education, prosthetic training, manual techniques, passive ROM, dry needling, taping, vasopnuematic device, vestibular, spinal manipulations, joint manipulations  PLAN FOR NEXT SESSION:  Traction and or DN if desired, functional strengthening as tolerated. 2 more visits left then reassess.   April Manson, PT,DPT 12/18/2022, 8:56 AM

## 2022-12-25 ENCOUNTER — Encounter: Payer: Self-pay | Admitting: Physical Therapy

## 2022-12-25 ENCOUNTER — Ambulatory Visit (INDEPENDENT_AMBULATORY_CARE_PROVIDER_SITE_OTHER): Payer: PPO | Admitting: Physical Therapy

## 2022-12-25 DIAGNOSIS — M5459 Other low back pain: Secondary | ICD-10-CM | POA: Diagnosis not present

## 2022-12-25 DIAGNOSIS — R262 Difficulty in walking, not elsewhere classified: Secondary | ICD-10-CM | POA: Diagnosis not present

## 2022-12-25 DIAGNOSIS — M542 Cervicalgia: Secondary | ICD-10-CM | POA: Diagnosis not present

## 2022-12-25 DIAGNOSIS — M6281 Muscle weakness (generalized): Secondary | ICD-10-CM

## 2022-12-25 NOTE — Therapy (Signed)
OUTPATIENT PHYSICAL THERAPY TREATMENT  Patient Name: Paul Ray MRN: [id number] DOB:Jul 22, 1956, 67 y.o., male Today's Date: 12/25/2022  END OF SESSION:  PT End of Session - 12/25/22 0925     Visit Number 12    Number of Visits 15    Date for PT Re-Evaluation 01/01/23    Progress Note Due on Visit 19    PT Start Time 0845    PT Stop Time 0930    PT Time Calculation (min) 45 min    Activity Tolerance Patient tolerated treatment well    Behavior During Therapy [facility] for tasks assessed/performed                 Past Medical History:  Diagnosis Date   Abscess of lung(513.0)    Right lower lobe   ADD (attention deficit disorder)    Anxiety    Arthritis    Ascending aortic aneurysm (HCC)    47mm by Chest CTA 05/2020 and 48mm by echo 05/2020   Benign essential HTN    Bipolar depression (HCC)    Coronary artery calcification seen on CAT scan    Empyema lung (HCC)    Hemorrhoids    Hepatitis C    Pneumonia    last year   Prostate abscess    Staph infection    Viral pericarditis    after COVID 19 infection   Past Surgical History:  Procedure Laterality Date   ELBOW SURGERY  2005   Right   FOOT NEUROMA SURGERY  2006   Left   HIP RESURFACING     left hip at [facility] 01/2011   LUMBAR LAMINECTOMY/DECOMPRESSION MICRODISCECTOMY  07/24/2011   Procedure: LUMBAR LAMINECTOMY/DECOMPRESSION MICRODISCECTOMY;  Surgeon: Tia Alert;  Location: [facility];  Service: Neurosurgery;  Laterality: Bilateral;  Bilateral  , Lumbar Three-Four, Lumbar Four-Five Decompressive Laminectomy Rm # 32   LUNG SURGERY     for empyema   SHOULDER ARTHROSCOPY Right 07/05/2013   Procedure: RIGHT SHOULDER ARTHROSCOPY WITH DEBRIDEMENT;  Surgeon: Kathryne Hitch, MD;  Location: [facility] OR;  Service: Orthopedics;  Laterality: Right;   TENDON TRANSFER Left 01/26/2014   Procedure: LEFT THUMB TRAPEZIECTOMY KNOTTED TENDON INTRAPOSITION TRANSFER OF ABDUCTOR POLLICIS LONGUS TO THENARS;  Surgeon: Wyn Forster, MD;  Location: [facility];  Service: Orthopedics;  Laterality: Left;   THORACOTOMY  November 14 2010   rt   Patient Active Problem List   Diagnosis Date Noted   Protrusion of lumbar intervertebral disc 09/23/2022   History of lumbar laminectomy for spinal cord decompression 01/08/2022   Spinal stenosis of lumbar region 07/25/2021   Foraminal stenosis of cervical region 03/08/2021   Viral pericarditis    Alcohol use disorder, severe, dependence (HCC) 12/06/2020   Chest pain 11/09/2020   Ascending aortic aneurysm (HCC)    Benign essential HTN    Coronary artery calcification seen on CAT scan    Psychoactive substance-induced psychosis (HCC)    Rib pain 07/01/2017   Closed fracture of one rib of left side 07/01/2017   Chronic right shoulder pain 05/04/2017   Osteoarthritis of right hip 12/02/2016   Lumbar radiculopathy 04/17/2014   Chronic low back pain 08/16/2013   Lumbar post-laminectomy syndrome 08/16/2013   Lumbosacral radiculitis 08/16/2013   Impingement syndrome of right shoulder 07/05/2013   Empyema lung (HCC)    Abscess of lung(513.0)    Bipolar depression (HCC)    ADD (attention deficit disorder)    Hepatitis C  Hip pain 05/02/2011   History of total hip arthroplasty 04/23/2011   Lung mass 10/22/2010   Pleural effusion 10/15/2010    PCP: Alveria Apley, NP   REFERRING PROVIDER: London Sheer, MD   REFERRING DIAG: M54.41 (ICD-10-CM) - Acute midline low back pain with right-sided sciatica   Rationale for Evaluation and Treatment: Rehabilitation  THERAPY DIAG:  Other low back pain  Muscle weakness (generalized)  Difficulty in walking, not elsewhere classified  Cervicalgia  ONSET DATE: 3 month onset of back pain  SUBJECTIVE:                                                                                                                                                                                           SUBJECTIVE  STATEMENT: He relays the pain is still around 6-7 but not the 9-10 he was having before starting PT. PERTINENT HISTORY:  MRI showing L1/2 disc herniation, L1/2 central stenosis, L2/3 central lateral recess stenosis. Previous laminectomy/disectomy 2012 L3-5, shoulder arthroscopy 2014, bilat hip replacement/resurfacing  PAIN:  Are you having pain? Yes: NPRS scale: 6-7/10 Pain location: low back and neck  Pain description: ache, N/T Aggravating factors: standing, walking, moving Relieving factors: sitting  PRECAUTIONS: disc herniation  WEIGHT BEARING RESTRICTIONS: No  FALLS:  Has patient fallen in last 6 months? No   PLOF: Independent  PATIENT GOALS: play basketball again  NEXT MD VISIT:   OBJECTIVE:   DIAGNOSTIC FINDINGS:  MRI of the lumbar spine from 10/01/2022 was independently reviewed and interpreted by Dr Christell Constant, "showing L1/2 disc herniation with caudal migration on the right side.  There is central stenosis at that level as well.  There is central and lateral recess stenosis at L2/3.  Mild foraminal stenosis bilaterally at L1/2 and L2/3.  Prior hemilaminotomy at L3/4 and L4/5. "  PATIENT SURVEYS:  Eval: FOTO 36% functional, goal is 56% 11/27/22: FOTO improved to 47%  SCREENING FOR RED FLAGS: Bowel or bladder incontinence: No Cauda equina syndrome: No   COGNITION: Overall cognitive status: Within functional limits for tasks assessed     SENSATION: WFL  MUSCLE LENGTH: Eval Hamstrings: tight bilat Thomas test: tight bilat, Rt is more tight  POSTURE:   PALPATION:   LUMBAR ROM:   AROM eval 11/20/22 11/27/22  Flexion WNL* WNL WNL but pain  Extension 50% Feels good 75% 75% and no pain  Right lateral flexion 50%* 75% WNL  Left lateral flexion 50%* 75% WNL  Right rotation 50%* 75% WNL  Left rotation 50%* 75% WNL   (Blank rows = not tested) * denotes pain  LOWER EXTREMITY ROM:  Right eval Left eval  Hip flexion    Hip extension    Hip abduction     Hip adduction    Hip internal rotation    Hip external rotation    Knee flexion    Knee extension    Ankle dorsiflexion    Ankle plantarflexion    Ankle inversion    Ankle eversion     (Blank rows = not tested)  LOWER EXTREMITY MMT:    MMT Right eval Left eval R/L 11/20/22  Hip flexion 4 4 4+/4+  Hip extension     Hip abduction 4 4 5/5  Hip adduction     Hip internal rotation     Hip external rotation     Knee flexion 5 5   Knee extension 5 5   Ankle dorsiflexion 5 5   Ankle plantarflexion     Ankle inversion     Ankle eversion      (Blank rows = not tested)  LUMBAR SPECIAL TESTS:  Slump test: Negative but had recent injection  FUNCTIONAL TESTS:  Eval Pain noted with sit to stand, needed UE support to push up  GAIT: Eval Comments: independent community Ambulator but pain with prolonged walking    TODAY'S TREATMENT:  12/25/22 Recumbent bike L4 X 5 min Sit to stand 15# X 15 Deadlift from 8 inch step to waist 15# X 15 Seated lat pull machine 25# 2X15 Seated row machine 25# 2X15 Seated chest press machine 15# 2X15 Standing rows blue X 20 Standing shoulder extensions blue X 20 Standing anti rotation blue X 15 bilat Hip abduction green X 15 bilat Hip extension green X 15 bilat Lumbar extensions 3 sec X 10 Supine hip flexor stretch 30 sec X 2 bilat  Mechanical lumbar traction 95-75# X 15 min  12/18/22 Recumbent bike L4 X 5 min Deadlift from 8 inch step to waist 15# X 15, cues and demo on body mechanics Sit to stand 15# X 15 Suitcase carry one lap around gym Rt UE, Lt UE, 15# Seated lat pull machine 25# 2X15 Seated row machine 25# 2X15 Seated chest press machine 15# 2X15 Hip abduction green X 15 bilat Hip extension green X 15 bilat Lumbar extensions 3 sec X 10 Supine hip flexor stretch 30 sec X 2 bilat   Mechanical lumbar traction 95-75# X 15 min   PATIENT EDUCATION: Education details: HEP, PT plan of care Person educated: Patient Education  method: Explanation, Demonstration, Verbal cues, and Handouts Education comprehension: verbalized understanding and needs further education   HOME EXERCISE PROGRAM: Access Code: [email or web address] URL: [email or web address] Date: 11/27/2022 Prepared by: Ivery Quale  Exercises - Standing Lumbar Extension  - 5 x daily - 6 x weekly - 1-2 sets - 10 reps - 5 hold - Supine Piriformis Stretch with Foot on Ground  - 2 x daily - 6 x weekly - 1 sets - 3 reps - 30 hold - Supine Bridge  - 2 x daily - 6 x weekly - 1-2 sets - 10 reps - 5 hold - Standing Hip Flexion with Resistance Loop  - 2 x daily - 6 x weekly - 2 sets - 10-15 reps - Hip Abduction with Resistance Loop  - 2 x daily - 6 x weekly - 2 sets - 10-15 reps - Modified Thomas Stretch  - 2 x daily - 6 x weekly - 3-5 sets - 20-30 hold - Seated Cervical Retraction  - 1 x daily - 6 x weekly - 1-2 sets -  10 reps - Seated Assisted Cervical Rotation with Towel  - 1 x daily - 6 x weekly - 1 sets - 10 reps - 5 hold - Mid-Lower Cervical Extension SNAG with Strap  - 1 x daily - 6 x weekly - 1 sets - 10 reps - Supine Suboccipital Release with Tennis Balls  - 2 x daily - 6 x weekly - 1 sets - 3-5 min hold - Seated Isometric Cervical Sidebending  - 2 x daily - 6 x weekly - 1 sets - 10 reps - 5 hold - Seated Isometric Cervical Extension  - 2 x daily - 6 x weekly - 1 sets - 10 reps - 6 hold - Seated Isometric Cervical Flexion  - 2 x daily - 6 x weekly - 1 sets - 10 reps - 6 hold  ASSESSMENT:  CLINICAL IMPRESSION: He had good tolerance to functional strength progressions. He does still have pain but wants to avoid having any more surgeries. He has one more PT visit left and we will assess for further recommendations regarding his PT plan of care.  OBJECTIVE IMPAIRMENTS: decreased activity tolerance, difficulty walking, decreased balance, decreased endurance, decreased mobility, decreased ROM, decreased strength, impaired flexibility, impaired UE/LE use,  postural dysfunction, and pain.  ACTIVITY LIMITATIONS: bending, lifting, carry, locomotion, cleaning, community activity, driving, and or occupation  PERSONAL FACTORS: Previous laminectomy/disectomy 2012 L3-5, shoulder arthroscopy 2014, bilat hip replacement/resurfacing are also affecting patient's functional outcome.  REHAB POTENTIAL: Fair    CLINICAL DECISION MAKING: Stable/uncomplicated  EVALUATION COMPLEXITY: Low    GOALS: Short term PT Goals Target date: 11/24/2022   Pt will be I and compliant with HEP. Baseline:  Goal status: MET 11/27/22 Pt will decrease pain by 25% overall Baseline:8 Goal status: MET 11/27/22  Long term PT goals Target date:01/01/2023   Pt will improve lumbar ROM to [facility] without complaints to improve functional mobility Baseline: Goal status: MET 11/27/22 Pt will improve bilat hip strength to at least 4+/5 MMT to improve functional strength Baseline: Goal status: MET 11/27/22 Pt will improve FOTO to at least 56% functional to show improved function Baseline:8 Goal status: ongoing 11/04/22 Pt will reduce pain by overall 50% overall with usual activity Baseline: Goal status: ongoing 11/27/22 Pt will be able to ambulate community distances at least 1000 ft WNL gait pattern without complaints Baseline: Goal status: ongoing 11/27/22  PLAN: PT FREQUENCY: 1 times per week   PT DURATION: 4 weeks  PLANNED INTERVENTIONS (unless contraindicated): aquatic PT, Canalith repositioning, cryotherapy, Electrical stimulation, Iontophoresis with 4 mg/ml dexamethasome, Moist heat, traction, Ultrasound, gait training, Therapeutic exercise, balance training, neuromuscular re-education, patient/family education, prosthetic training, manual techniques, passive ROM, dry needling, taping, vasopnuematic device, vestibular, spinal manipulations, joint manipulations  PLAN FOR NEXT SESSION:  Traction and or DN if desired, functional strengthening as tolerated. 1 more visit left so need to  reassess.   April Manson, PT,DPT 12/25/2022, 9:26 AM

## 2022-12-26 ENCOUNTER — Other Ambulatory Visit: Payer: PPO

## 2023-01-01 ENCOUNTER — Encounter: Payer: Self-pay | Admitting: Physical Therapy

## 2023-01-01 ENCOUNTER — Ambulatory Visit (INDEPENDENT_AMBULATORY_CARE_PROVIDER_SITE_OTHER): Payer: PPO | Admitting: Physical Therapy

## 2023-01-01 DIAGNOSIS — M6281 Muscle weakness (generalized): Secondary | ICD-10-CM | POA: Diagnosis not present

## 2023-01-01 DIAGNOSIS — M5459 Other low back pain: Secondary | ICD-10-CM | POA: Diagnosis not present

## 2023-01-01 DIAGNOSIS — R262 Difficulty in walking, not elsewhere classified: Secondary | ICD-10-CM

## 2023-01-01 DIAGNOSIS — M542 Cervicalgia: Secondary | ICD-10-CM

## 2023-01-01 NOTE — Therapy (Addendum)
OUTPATIENT PHYSICAL THERAPY TREATMENT   /DISCHARGE  Patient Name: Paul Ray MRN: [id number] DOB:1956/02/21, 67 y.o., male Today's Date: 01/01/2023  END OF SESSION:  PT End of Session - 01/01/23 0853     Visit Number 13    Number of Visits 15    Date for PT Re-Evaluation 01/01/23    Progress Note Due on Visit 19    PT Start Time 0845    PT Stop Time 0930    PT Time Calculation (min) 45 min    Activity Tolerance Patient tolerated treatment well    Behavior During Therapy [facility] for tasks assessed/performed                 Past Medical History:  Diagnosis Date   Abscess of lung(513.0)    Right lower lobe   ADD (attention deficit disorder)    Anxiety    Arthritis    Ascending aortic aneurysm (HCC)    47mm by Chest CTA 05/2020 and 48mm by echo 05/2020   Benign essential HTN    Bipolar depression (HCC)    Coronary artery calcification seen on CAT scan    Empyema lung (HCC)    Hemorrhoids    Hepatitis C    Pneumonia    last year   Prostate abscess    Staph infection    Viral pericarditis    after COVID 19 infection   Past Surgical History:  Procedure Laterality Date   ELBOW SURGERY  2005   Right   FOOT NEUROMA SURGERY  2006   Left   HIP RESURFACING     left hip [facility] 01/2011   LUMBAR LAMINECTOMY/DECOMPRESSION MICRODISCECTOMY  07/24/2011   Procedure: LUMBAR LAMINECTOMY/DECOMPRESSION MICRODISCECTOMY;  Surgeon: Tia Alert;  Location: [facility];  Service: Neurosurgery;  Laterality: Bilateral;  Bilateral  , Lumbar Three-Four, Lumbar Four-Five Decompressive Laminectomy Rm # [id number]   LUNG SURGERY     for empyema   SHOULDER ARTHROSCOPY Right 07/05/2013   Procedure: RIGHT SHOULDER ARTHROSCOPY WITH DEBRIDEMENT;  Surgeon: Kathryne Hitch, MD;  Location: [facility];  Service: Orthopedics;  Laterality: Right;   TENDON TRANSFER Left 01/26/2014   Procedure: LEFT THUMB TRAPEZIECTOMY KNOTTED TENDON INTRAPOSITION TRANSFER OF ABDUCTOR POLLICIS LONGUS TO THENARS;  Surgeon:  Wyn Forster, MD;  Location: [facility];  Service: Orthopedics;  Laterality: Left;   THORACOTOMY  November 14 2010   rt   Patient Active Problem List   Diagnosis Date Noted   Protrusion of lumbar intervertebral disc 09/23/2022   History of lumbar laminectomy for spinal cord decompression 01/08/2022   Spinal stenosis of lumbar region 07/25/2021   Foraminal stenosis of cervical region 03/08/2021   Viral pericarditis    Alcohol use disorder, severe, dependence (HCC) 12/06/2020   Chest pain 11/09/2020   Ascending aortic aneurysm (HCC)    Benign essential HTN    Coronary artery calcification seen on CAT scan    Psychoactive substance-induced psychosis (HCC)    Rib pain 07/01/2017   Closed fracture of one rib of left side 07/01/2017   Chronic right shoulder pain 05/04/2017   Osteoarthritis of right hip 12/02/2016   Lumbar radiculopathy 04/17/2014   Chronic low back pain 08/16/2013   Lumbar post-laminectomy syndrome 08/16/2013   Lumbosacral radiculitis 08/16/2013   Impingement syndrome of right shoulder 07/05/2013   Empyema lung (HCC)    Abscess of lung(513.0)    Bipolar depression (HCC)    ADD (attention deficit disorder)    Hepatitis C  Hip pain 05/02/2011   History of total hip arthroplasty 04/23/2011   Lung mass 10/22/2010   Pleural effusion 10/15/2010    PCP: Alveria Apley, NP   REFERRING PROVIDER: London Sheer, MD   REFERRING DIAG: M54.41 (ICD-10-CM) - Acute midline low back pain with right-sided sciatica   Rationale for Evaluation and Treatment: Rehabilitation  THERAPY DIAG:  Other low back pain  Muscle weakness (generalized)  Difficulty in walking, not elsewhere classified  Cervicalgia  ONSET DATE: 3 month onset of back pain  SUBJECTIVE:                                                                                                                                                                                            SUBJECTIVE STATEMENT: He relays he did a lot of heavy lifting this weekend helping people remove some junk metal and such so has some pain associated from this. He does relays he can tell he is getting stronger.   PERTINENT HISTORY:  MRI showing L1/2 disc herniation, L1/2 central stenosis, L2/3 central lateral recess stenosis. Previous laminectomy/disectomy 2012 L3-5, shoulder arthroscopy 2014, bilat hip replacement/resurfacing  PAIN:  Are you having pain? Yes: NPRS scale: 7/10 Pain location: low back and neck  Pain description: ache, N/T Aggravating factors: standing, walking, moving Relieving factors: sitting  PRECAUTIONS: disc herniation  WEIGHT BEARING RESTRICTIONS: No  FALLS:  Has patient fallen in last 6 months? No   PLOF: Independent  PATIENT GOALS: play basketball again  NEXT MD VISIT:   OBJECTIVE:   DIAGNOSTIC FINDINGS:  MRI of the lumbar spine from 10/01/2022 was independently reviewed and interpreted by Dr Christell Constant, "showing L1/2 disc herniation with caudal migration on the right side.  There is central stenosis at that level as well.  There is central and lateral recess stenosis at L2/3.  Mild foraminal stenosis bilaterally at L1/2 and L2/3.  Prior hemilaminotomy at L3/4 and L4/5. "  PATIENT SURVEYS:  Eval: FOTO 36% functional, goal is 56% 11/27/22: FOTO improved to 47%  SCREENING FOR RED FLAGS: Bowel or bladder incontinence: No Cauda equina syndrome: No   COGNITION: Overall cognitive status: Within functional limits for tasks assessed     SENSATION: WFL  MUSCLE LENGTH: Eval Hamstrings: tight bilat Thomas test: tight bilat, Rt is more tight  POSTURE:   PALPATION:   LUMBAR ROM:   AROM eval 11/20/22 11/27/22  Flexion WNL* WNL WNL but pain  Extension 50% Feels good 75% 75% and no pain  Right lateral flexion 50%* 75% WNL  Left lateral flexion 50%* 75% WNL  Right rotation 50%* 75% WNL  Left rotation 50%* 75%  WNL   (Blank rows = not tested) *  denotes pain  LOWER EXTREMITY ROM:      Right eval Left eval  Hip flexion    Hip extension    Hip abduction    Hip adduction    Hip internal rotation    Hip external rotation    Knee flexion    Knee extension    Ankle dorsiflexion    Ankle plantarflexion    Ankle inversion    Ankle eversion     (Blank rows = not tested)  LOWER EXTREMITY MMT:    MMT Right eval Left eval R/L 11/20/22  Hip flexion 4 4 4+/4+  Hip extension     Hip abduction 4 4 5/5  Hip adduction     Hip internal rotation     Hip external rotation     Knee flexion 5 5   Knee extension 5 5   Ankle dorsiflexion 5 5   Ankle plantarflexion     Ankle inversion     Ankle eversion      (Blank rows = not tested)  LUMBAR SPECIAL TESTS:  Slump test: Negative but had recent injection  FUNCTIONAL TESTS:  Eval Pain noted with sit to stand, needed UE support to push up  GAIT: Eval Comments: independent community Ambulator but pain with prolonged walking    TODAY'S TREATMENT:  01/01/23 Recumbent bike L4 X 5 min Sit to stand 20# 2X 10 Deadlift from 8 inch step to waist 25# 2X10 Seated lat pull machine 25# 2X15 Seated row machine 25# 2X15 Seated chest press machine 15# 2X15 Standing shoulder extensions blue 2X15 Diagonal chops (high to low) X 15 bilat Standing anti rotation blue X 15 bilat Hip abduction green X 15 bilat Hip extension green X 15 bilat Lumbar extensions 3 sec X 10 Supine hip flexor stretch 30 sec X 2 bilat  Mechanical lumbar traction 95-75# X 15 min  12/25/22 Recumbent bike L4 X 5 min Sit to stand 15# X 15 Deadlift from 8 inch step to waist 15# X 15 Seated lat pull machine 25# 2X15 Seated row machine 25# 2X15 Seated chest press machine 15# 2X15 Standing rows blue X 20 Standing shoulder extensions blue X 20 Standing anti rotation blue X 15 bilat Hip abduction green X 15 bilat Hip extension green X 15 bilat Lumbar extensions 3 sec X 10 Supine hip flexor stretch 30 sec X 2  bilat  Mechanical lumbar traction 95-75# X 15 min  12/18/22 Recumbent bike L4 X 5 min Deadlift from 8 inch step to waist 15# X 15, cues and demo on body mechanics Sit to stand 15# X 15 Suitcase carry one lap around gym Rt UE, Lt UE, 15# Seated lat pull machine 25# 2X15 Seated row machine 25# 2X15 Seated chest press machine 15# 2X15 Hip abduction green X 15 bilat Hip extension green X 15 bilat Lumbar extensions 3 sec X 10 Supine hip flexor stretch 30 sec X 2 bilat   Mechanical lumbar traction 95-75# X 15 min   PATIENT EDUCATION: Education details: HEP, PT plan of care Person educated: Patient Education method: Explanation, Demonstration, Verbal cues, and Handouts Education comprehension: verbalized understanding and needs further education   HOME EXERCISE PROGRAM: Access Code: [email or web address] URL: [email or web address] Date: 11/27/2022 Prepared by: Ivery Quale  Exercises - Standing Lumbar Extension  - 5 x daily - 6 x weekly - 1-2 sets - 10 reps - 5 hold - Supine Piriformis Stretch with Foot on Ground  - 2  x daily - 6 x weekly - 1 sets - 3 reps - 30 hold - Supine Bridge  - 2 x daily - 6 x weekly - 1-2 sets - 10 reps - 5 hold - Standing Hip Flexion with Resistance Loop  - 2 x daily - 6 x weekly - 2 sets - 10-15 reps - Hip Abduction with Resistance Loop  - 2 x daily - 6 x weekly - 2 sets - 10-15 reps - Modified Thomas Stretch  - 2 x daily - 6 x weekly - 3-5 sets - 20-30 hold - Seated Cervical Retraction  - 1 x daily - 6 x weekly - 1-2 sets - 10 reps - Seated Assisted Cervical Rotation with Towel  - 1 x daily - 6 x weekly - 1 sets - 10 reps - 5 hold - Mid-Lower Cervical Extension SNAG with Strap  - 1 x daily - 6 x weekly - 1 sets - 10 reps - Supine Suboccipital Release with Tennis Balls  - 2 x daily - 6 x weekly - 1 sets - 3-5 min hold - Seated Isometric Cervical Sidebending  - 2 x daily - 6 x weekly - 1 sets - 10 reps - 5 hold - Seated Isometric Cervical Extension  - 2  x daily - 6 x weekly - 1 sets - 10 reps - 6 hold - Seated Isometric Cervical Flexion  - 2 x daily - 6 x weekly - 1 sets - 10 reps - 6 hold  ASSESSMENT:  CLINICAL IMPRESSION: He would like to hold PT to try independent program for 30 days which will allow him to come back in if needed. After 30 days if no return we will discharge then.   OBJECTIVE IMPAIRMENTS: decreased activity tolerance, difficulty walking, decreased balance, decreased endurance, decreased mobility, decreased ROM, decreased strength, impaired flexibility, impaired UE/LE use, postural dysfunction, and pain.  ACTIVITY LIMITATIONS: bending, lifting, carry, locomotion, cleaning, community activity, driving, and or occupation  PERSONAL FACTORS: Previous laminectomy/disectomy 2012 L3-5, shoulder arthroscopy 2014, bilat hip replacement/resurfacing are also affecting patient's functional outcome.  REHAB POTENTIAL: Fair    CLINICAL DECISION MAKING: Stable/uncomplicated  EVALUATION COMPLEXITY: Low    GOALS: Short term PT Goals Target date: 11/24/2022   Pt will be I and compliant with HEP. Baseline:  Goal status: MET 11/27/22 Pt will decrease pain by 25% overall Baseline:8 Goal status: MET 11/27/22  Long term PT goals Target date:01/01/2023   Pt will improve lumbar ROM to [facility] without complaints to improve functional mobility Baseline: Goal status: MET 11/27/22 Pt will improve bilat hip strength to at least 4+/5 MMT to improve functional strength Baseline: Goal status: MET 11/27/22 Pt will improve FOTO to at least 56% functional to show improved function Baseline:8 Goal status: ongoing 11/04/22 Pt will reduce pain by overall 50% overall with usual activity Baseline: Goal status: ongoing 11/27/22 Pt will be able to ambulate community distances at least 1000 ft WNL gait pattern without complaints Baseline: Goal status: ongoing 11/27/22  PLAN: PT FREQUENCY: 1 times per week   PT DURATION: 4 weeks  PLANNED INTERVENTIONS  (unless contraindicated): aquatic PT, Canalith repositioning, cryotherapy, Electrical stimulation, Iontophoresis with 4 mg/ml dexamethasome, Moist heat, traction, Ultrasound, gait training, Therapeutic exercise, balance training, neuromuscular re-education, patient/family education, prosthetic training, manual techniques, passive ROM, dry needling, taping, vasopnuematic device, vestibular, spinal manipulations, joint manipulations  PLAN FOR NEXT SESSION:  Hold PT for 30 day to try independent program  April Manson, PT,DPT 01/01/2023, 8:53 AMa  PHYSICAL THERAPY DISCHARGE SUMMARY  Visits from Start of Care: 13  Current functional level related to goals / functional outcomes: See note   Remaining deficits: See note   Education / Equipment: HEP  Patient goals were partially met. Patient is being discharged due to not returning since the last visit.  Chyrel Masson, PT, DPT, OCS, ATC 02/05/23  1:27 PM

## 2023-01-15 ENCOUNTER — Encounter: Payer: Self-pay | Admitting: Family Medicine

## 2023-01-15 ENCOUNTER — Ambulatory Visit (INDEPENDENT_AMBULATORY_CARE_PROVIDER_SITE_OTHER): Payer: PPO | Admitting: Family Medicine

## 2023-01-15 VITALS — BP 122/64 | HR 72 | Temp 98.7°F | Ht 67.0 in | Wt 166.5 lb

## 2023-01-15 DIAGNOSIS — R234 Changes in skin texture: Secondary | ICD-10-CM

## 2023-01-15 MED ORDER — MUPIROCIN CALCIUM 2 % EX CREA
TOPICAL_CREAM | Freq: Two times a day (BID) | CUTANEOUS | Status: DC
Start: 2023-01-15 — End: 2023-01-15

## 2023-01-15 MED ORDER — MUPIROCIN 2 % EX OINT
1.0000 | TOPICAL_OINTMENT | Freq: Two times a day (BID) | CUTANEOUS | 0 refills | Status: AC
Start: 2023-01-15 — End: 2023-01-22

## 2023-01-15 NOTE — Progress Notes (Signed)
   Acute Office Visit   Subjective:  Patient ID: Paul Ray, male    DOB: March 29, 1956, 67 y.o.   MRN: [id number]  Chief Complaint  Patient presents with   lesion on scrotum    Pt reports he noticed this yesterday Pt thinks this may be a tick. Denies pain Swollen and red    HPI Patient is a complaining of lesion on his scrotum. He noticed this yesterday when taking a shower. Denies pain. Reports it is red and swollen.  He reports he put alcohol ROS See HPI above      Objective:   BP 122/64   Pulse 72   Temp 98.7 F (37.1 C)   Ht 5\' 7"  (1.702 m)   Wt 166 lb 8 oz (75.5 kg)   SpO2 98%   BMI 26.08 kg/m    Physical Exam Vitals reviewed.  Constitutional:      General: He is not in acute distress.    Appearance: Normal appearance. He is not ill-appearing, toxic-appearing or diaphoretic.  HENT:     Head: Normocephalic and atraumatic.  Eyes:     General:        Right eye: No discharge.        Left eye: No discharge.     Conjunctiva/sclera: Conjunctivae normal.  Cardiovascular:     Rate and Rhythm: Normal rate and regular rhythm.  Pulmonary:     Effort: Pulmonary effort is normal. No respiratory distress.  Musculoskeletal:        General: Normal range of motion.  Skin:    General: Skin is warm and dry.     Comments: Scab to right scrotum; mild erythema, no drainage.   Neurological:     General: No focal deficit present.     Mental Status: He is alert and oriented to person, place, and time. Mental status is at baseline.  Psychiatric:        Mood and Affect: Mood normal.        Behavior: Behavior normal.        Thought Content: Thought content normal.        Judgment: Judgment normal.        Assessment & Plan:  Scab -     Mupirocin; Apply 1 Application topically 2 (two) times daily for 7 days.  Dispense: 14 g; Refill: 0  --Recommend to cleanse right scrotum with Dial Antibacterial soap twice a day or when you become sweaty. Pat dry and apply Bactroban ointment  to the area. -Continue to monitor for signs of infection. If symptoms occur, follow up sooner.   Zandra Abts, NP

## 2023-01-15 NOTE — Patient Instructions (Addendum)
-  Recommend to cleanse right scrotum with Dial Antibacterial soap twice a day or when you become sweaty. Pat dry and apply Bactroban ointment to the area. -Continue to monitor for signs of infection. If symptoms occur, follow up sooner.

## 2023-01-20 ENCOUNTER — Other Ambulatory Visit: Payer: Self-pay | Admitting: Behavioral Health

## 2023-01-20 ENCOUNTER — Other Ambulatory Visit: Payer: Self-pay

## 2023-01-20 ENCOUNTER — Telehealth: Payer: Self-pay | Admitting: Behavioral Health

## 2023-01-20 DIAGNOSIS — F902 Attention-deficit hyperactivity disorder, combined type: Secondary | ICD-10-CM

## 2023-01-20 DIAGNOSIS — G479 Sleep disorder, unspecified: Secondary | ICD-10-CM

## 2023-01-20 DIAGNOSIS — F411 Generalized anxiety disorder: Secondary | ICD-10-CM

## 2023-01-20 DIAGNOSIS — F3160 Bipolar disorder, current episode mixed, unspecified: Secondary | ICD-10-CM

## 2023-01-20 DIAGNOSIS — F331 Major depressive disorder, recurrent, moderate: Secondary | ICD-10-CM

## 2023-01-20 MED ORDER — AMPHETAMINE-DEXTROAMPHET ER 30 MG PO CP24
ORAL_CAPSULE | ORAL | 0 refills | Status: DC
Start: 2023-01-20 — End: 2023-02-17

## 2023-01-20 NOTE — Telephone Encounter (Signed)
Pt called requesting to send this Rx  and both generic adderall 30 XR &generic adderall 20 mg to [facility] [address]

## 2023-01-20 NOTE — Telephone Encounter (Signed)
30 XR was sent, pended 20 mg.

## 2023-01-20 NOTE — Telephone Encounter (Signed)
Patient called in for refill on Adderall 30mg . Ph: [phone] Appt 8/5 Pharmacy [facility] [address] [place]

## 2023-01-21 MED ORDER — AMPHETAMINE-DEXTROAMPHETAMINE 20 MG PO TABS
20.0000 mg | ORAL_TABLET | Freq: Every day | ORAL | 0 refills | Status: DC
Start: 2023-01-21 — End: 2023-02-17

## 2023-02-13 ENCOUNTER — Telehealth: Payer: Self-pay | Admitting: Behavioral Health

## 2023-02-13 NOTE — Telephone Encounter (Signed)
Seif called at 9:15 to request refills of his Adderall 20mg  and 30mg  XR.  Appt 8/5.  Send to [facility] [id number] - [place], [place] - [address]

## 2023-02-13 NOTE — Telephone Encounter (Signed)
LF 6/25 and 6/26, due 7/23 and 7/24

## 2023-02-13 NOTE — Telephone Encounter (Signed)
Alprazolam last filled 6/25, due 7/23. Canceled Rx at [facility].

## 2023-02-13 NOTE — Telephone Encounter (Signed)
Patient returned call to office for refill on Xanax 1mg  sent to [facility]. He would like all refills to now go to this location.

## 2023-02-16 NOTE — Telephone Encounter (Signed)
Mylen called to check status of his Adderall refills.  He said he can pick them up by 7/24 and would like them sent in so he pick them up on time.  Appt 8/5

## 2023-02-17 ENCOUNTER — Other Ambulatory Visit: Payer: Self-pay | Admitting: Behavioral Health

## 2023-02-17 ENCOUNTER — Other Ambulatory Visit: Payer: Self-pay

## 2023-02-17 DIAGNOSIS — G479 Sleep disorder, unspecified: Secondary | ICD-10-CM

## 2023-02-17 DIAGNOSIS — F331 Major depressive disorder, recurrent, moderate: Secondary | ICD-10-CM

## 2023-02-17 DIAGNOSIS — F411 Generalized anxiety disorder: Secondary | ICD-10-CM

## 2023-02-17 DIAGNOSIS — F3160 Bipolar disorder, current episode mixed, unspecified: Secondary | ICD-10-CM

## 2023-02-17 DIAGNOSIS — F902 Attention-deficit hyperactivity disorder, combined type: Secondary | ICD-10-CM

## 2023-02-17 MED ORDER — AMPHETAMINE-DEXTROAMPHET ER 30 MG PO CP24
ORAL_CAPSULE | ORAL | 0 refills | Status: DC
Start: 2023-02-17 — End: 2023-03-02

## 2023-02-17 MED ORDER — ALPRAZOLAM 1 MG PO TABS
1.0000 mg | ORAL_TABLET | Freq: Three times a day (TID) | ORAL | 0 refills | Status: DC | PRN
Start: 2023-02-17 — End: 2023-03-02

## 2023-02-17 MED ORDER — AMPHETAMINE-DEXTROAMPHETAMINE 20 MG PO TABS
20.0000 mg | ORAL_TABLET | Freq: Every day | ORAL | 0 refills | Status: DC
Start: 2023-02-18 — End: 2023-03-02

## 2023-02-17 NOTE — Telephone Encounter (Signed)
Pended.

## 2023-02-24 ENCOUNTER — Other Ambulatory Visit: Payer: Self-pay | Admitting: Behavioral Health

## 2023-03-02 ENCOUNTER — Encounter: Payer: Self-pay | Admitting: Behavioral Health

## 2023-03-02 ENCOUNTER — Ambulatory Visit (INDEPENDENT_AMBULATORY_CARE_PROVIDER_SITE_OTHER): Payer: PPO | Admitting: Behavioral Health

## 2023-03-02 DIAGNOSIS — F331 Major depressive disorder, recurrent, moderate: Secondary | ICD-10-CM

## 2023-03-02 DIAGNOSIS — G479 Sleep disorder, unspecified: Secondary | ICD-10-CM | POA: Diagnosis not present

## 2023-03-02 DIAGNOSIS — F319 Bipolar disorder, unspecified: Secondary | ICD-10-CM | POA: Diagnosis not present

## 2023-03-02 DIAGNOSIS — F3160 Bipolar disorder, current episode mixed, unspecified: Secondary | ICD-10-CM

## 2023-03-02 DIAGNOSIS — F411 Generalized anxiety disorder: Secondary | ICD-10-CM

## 2023-03-02 DIAGNOSIS — F902 Attention-deficit hyperactivity disorder, combined type: Secondary | ICD-10-CM

## 2023-03-02 MED ORDER — LAMOTRIGINE 200 MG PO TABS
200.0000 mg | ORAL_TABLET | Freq: Every morning | ORAL | 2 refills | Status: DC
Start: 2023-03-02 — End: 2023-03-17

## 2023-03-02 MED ORDER — ALPRAZOLAM 1 MG PO TABS
1.0000 mg | ORAL_TABLET | Freq: Three times a day (TID) | ORAL | 2 refills | Status: DC | PRN
Start: 2023-03-02 — End: 2023-03-17

## 2023-03-02 MED ORDER — AMPHETAMINE-DEXTROAMPHET ER 30 MG PO CP24
ORAL_CAPSULE | ORAL | 0 refills | Status: DC
Start: 2023-03-20 — End: 2023-04-17

## 2023-03-02 MED ORDER — CARIPRAZINE HCL 1.5 MG PO CAPS
1.5000 mg | ORAL_CAPSULE | Freq: Every day | ORAL | 5 refills | Status: DC
Start: 2023-03-02 — End: 2023-09-09

## 2023-03-02 MED ORDER — AMPHETAMINE-DEXTROAMPHETAMINE 20 MG PO TABS
20.0000 mg | ORAL_TABLET | Freq: Every day | ORAL | 0 refills | Status: DC
Start: 2023-03-20 — End: 2023-04-17

## 2023-03-02 NOTE — Progress Notes (Deleted)
[facility]  Patient ID: Paul Ray,  MRN: [id number]  PCP: Alveria Apley, NP  Date of Evaluation: 03/02/2023 Time spent:30 minutes  Chief Complaint:  Chief Complaint   Anxiety; Depression; Follow-up; Patient Education; Medication Refill     HISTORY/CURRENT STATUS: HPI  Individual Medical History/ Review of Systems: Changes? :No   Allergies: Quinolones  Current Medications:  Current Outpatient Medications:    ALPRAZolam (XANAX) 1 MG tablet, Take 1 tablet (1 mg total) by mouth 3 (three) times daily as needed for anxiety., Disp: 90 tablet, Rfl: 2   [START ON 03/20/2023] amphetamine-dextroamphetamine (ADDERALL XR) 30 MG 24 hr capsule, Take 1 capsule (30 mg) by mouth every morning., Disp: 30 capsule, Rfl: 0   [START ON 03/20/2023] amphetamine-dextroamphetamine (ADDERALL) 20 MG tablet, Take 1 tablet (20 mg total) by mouth daily., Disp: 30 tablet, Rfl: 0   aspirin EC 81 MG tablet, Take 1 tablet (81 mg total) by mouth daily. Swallow whole., Disp: 90 tablet, Rfl: 3   atorvastatin (LIPITOR) 20 MG tablet, Take 1 tablet (20 mg total) by mouth daily., Disp: 90 tablet, Rfl: 3   cariprazine (VRAYLAR) 1.5 MG capsule, Take 1 capsule (1.5 mg total) by mouth daily., Disp: 30 capsule, Rfl: 5   lamoTRIgine (LAMICTAL) 200 MG tablet, Take 1 tablet (200 mg total) by mouth in the morning., Disp: 90 tablet, Rfl: 2   metoprolol succinate (TOPROL XL) 25 MG 24 hr tablet, Take 1 tablet (25 mg total) by mouth at bedtime., Disp: 90 tablet, Rfl: 3   silodosin (RAPAFLO) 8 MG CAPS capsule, Take 8 mg by mouth daily., Disp: , Rfl:  Medication Side Effects: none  Family Medical/ Social History: Changes? No  MENTAL HEALTH EXAM:  There were no vitals taken for this visit.There is no height or weight on file to calculate BMI.  General Appearance: Casual, Neat, and Well Groomed  Eye Contact:  Good  Speech:  Clear and Coherent  Volume:  Normal  Mood:  NA  Affect:  Appropriate  Thought Process:   Coherent  Orientation:  Full (Time, Place, and Person)  Thought Content: Logical   Suicidal Thoughts:  No  Homicidal Thoughts:  No  Memory:  WNL  Judgement:  Good  Insight:  Good  Psychomotor Activity:  Normal  Concentration:  Concentration: Good  Recall:  Good  Fund of Knowledge: Good  Language: Good  Assets:  Desire for Improvement  ADL's:  Intact  Cognition: WNL  Prognosis:  Good    DIAGNOSES:    ICD-10-CM   1. Generalized anxiety disorder  F41.1 cariprazine (VRAYLAR) 1.5 MG capsule    amphetamine-dextroamphetamine (ADDERALL XR) 30 MG 24 hr capsule    2. Bipolar I disorder, most recent episode mixed (HCC)  F31.60 cariprazine (VRAYLAR) 1.5 MG capsule    amphetamine-dextroamphetamine (ADDERALL XR) 30 MG 24 hr capsule    3. Bipolar depression (HCC)  F31.9 lamoTRIgine (LAMICTAL) 200 MG tablet    4. Sleep disturbance  G47.9 ALPRAZolam (XANAX) 1 MG tablet    5. Major depressive disorder, recurrent episode, moderate (HCC)  F33.1 amphetamine-dextroamphetamine (ADDERALL XR) 30 MG 24 hr capsule    6. Attention deficit hyperactivity disorder (ADHD), combined type  F90.2 amphetamine-dextroamphetamine (ADDERALL) 20 MG tablet      Receiving Psychotherapy: No    RECOMMENDATIONS:        Joan Flores, NP

## 2023-03-02 NOTE — Progress Notes (Signed)
[facility]  Patient ID: Paul Ray,  MRN: [id number]  PCP: Alveria Apley, NP  Date of Evaluation: 03/02/2023 Time spent:30 minutes  Chief Complaint:  Chief Complaint   Anxiety; Depression; Follow-up; Patient Education; Medication Refill     HISTORY/CURRENT STATUS: HPI  67 year old male presents to this office for follow up and medication management. He is very calm an collected. Cooperative and very nice today. He is very content with his medication right now and says [name] has been working great. Requesting no changes this visit.  His anxiety today is 2/10 and depression is 1/10.  He insist on 6 mo f/u's. He is sleeping 6-7 hours per night.  Trazodone help a little. He denies current mania, no current psychosis, no SI/HI. He says he will request medical records.   Prior psychiatric medications as reported: Zoloft Wellbutrin Prozac Abilify Seroquel Zyprexa      Individual Medical History/ Review of Systems: Changes? :No   Allergies: Quinolones  Current Medications:  Current Outpatient Medications:    ALPRAZolam (XANAX) 1 MG tablet, Take 1 tablet (1 mg total) by mouth 3 (three) times daily as needed for anxiety., Disp: 90 tablet, Rfl: 2   [START ON 03/20/2023] amphetamine-dextroamphetamine (ADDERALL XR) 30 MG 24 hr capsule, Take 1 capsule (30 mg) by mouth every morning., Disp: 30 capsule, Rfl: 0   [START ON 03/20/2023] amphetamine-dextroamphetamine (ADDERALL) 20 MG tablet, Take 1 tablet (20 mg total) by mouth daily., Disp: 30 tablet, Rfl: 0   aspirin EC 81 MG tablet, Take 1 tablet (81 mg total) by mouth daily. Swallow whole., Disp: 90 tablet, Rfl: 3   atorvastatin (LIPITOR) 20 MG tablet, Take 1 tablet (20 mg total) by mouth daily., Disp: 90 tablet, Rfl: 3   cariprazine (VRAYLAR) 1.5 MG capsule, Take 1 capsule (1.5 mg total) by mouth daily., Disp: 30 capsule, Rfl: 5   lamoTRIgine (LAMICTAL) 200 MG tablet, Take 1 tablet (200 mg total) by mouth in the  morning., Disp: 90 tablet, Rfl: 2   metoprolol succinate (TOPROL XL) 25 MG 24 hr tablet, Take 1 tablet (25 mg total) by mouth at bedtime., Disp: 90 tablet, Rfl: 3   silodosin (RAPAFLO) 8 MG CAPS capsule, Take 8 mg by mouth daily., Disp: , Rfl:  Medication Side Effects: none  Family Medical/ Social History: Changes? No  MENTAL HEALTH EXAM:  There were no vitals taken for this visit.There is no height or weight on file to calculate BMI.  General Appearance: Casual, Neat, and Well Groomed  Eye Contact:  Good  Speech:  Clear and Coherent  Volume:  Normal  Mood:  NA  Affect:  Appropriate  Thought Process:  Coherent  Orientation:  Full (Time, Place, and Person)  Thought Content: Logical   Suicidal Thoughts:  No  Homicidal Thoughts:  No  Memory:  WNL  Judgement:  Good  Insight:  Good  Psychomotor Activity:  Normal  Concentration:  Concentration: Good  Recall:  Good  Fund of Knowledge: Good  Language: Good  Assets:  Desire for Improvement  ADL's:  Intact  Cognition: WNL  Prognosis:  Good    DIAGNOSES:    ICD-10-CM   1. Generalized anxiety disorder  F41.1 cariprazine (VRAYLAR) 1.5 MG capsule    amphetamine-dextroamphetamine (ADDERALL XR) 30 MG 24 hr capsule    2. Bipolar I disorder, most recent episode mixed (HCC)  F31.60 cariprazine (VRAYLAR) 1.5 MG capsule    amphetamine-dextroamphetamine (ADDERALL XR) 30 MG 24 hr capsule    3.  Bipolar depression (HCC)  F31.9 lamoTRIgine (LAMICTAL) 200 MG tablet    4. Sleep disturbance  G47.9 ALPRAZolam (XANAX) 1 MG tablet    5. Major depressive disorder, recurrent episode, moderate (HCC)  F33.1 amphetamine-dextroamphetamine (ADDERALL XR) 30 MG 24 hr capsule    6. Attention deficit hyperactivity disorder (ADHD), combined type  F90.2 amphetamine-dextroamphetamine (ADDERALL) 20 MG tablet      Receiving Psychotherapy: No    RECOMMENDATIONS:   Greater than 50% of 30  min face to face time with patient was spent on counseling and  coordination of care.  He is calm and collected this visit which is pleasant to see with him. We talked about his current stability and he is very content right now. Very involved with his daughter who are both in college. Would like to continue with 6 mo f/u.     We agreed today to:   Continue on Vraylar 1.5 mg daily. capsule daily.  Continue Adderall. 30 mg ER in the am and 20 mg IR in the afternoon. Educated Pt that 60 mg was my prescribing limit per day.  Continue Trazodone 50 mg at bedtime daily Continue Lamictal 200 mg daily To report worsening symptoms or side effects promptly. Provided emergency contact information Discussed potential benefits, risks, and side effects of stimulants with patient to include increased heart rate, palpitations, insomnia, increased anxiety, increased irritability, or decreased appetite.  Instructed patient to contact office if experiencing any significant tolerability issues.  Discussed potential metabolic side effects associated with atypical antipsychotics, as well as potential risk for movement side effects. Advised pt to contact office if movement side effects occur.   Discussed potential benefits, risk, and side effects of benzodiazepines to include potential risk of tolerance and dependence, as well as possible drowsiness.  Advised patient not to drive if experiencing drowsiness and to take lowest possible effective dose to minimize risk of dependence and tolerance.  Monitor for any sign of rash. Please taking Lamictal and contact office immediately rash develops. Recommend seeking urgent medical attention if rash is severe and/or spreading quickly.  Reviewed PDMP              Joan Flores, NP

## 2023-03-16 ENCOUNTER — Telehealth: Payer: Self-pay | Admitting: Behavioral Health

## 2023-03-16 DIAGNOSIS — F319 Bipolar disorder, unspecified: Secondary | ICD-10-CM

## 2023-03-16 NOTE — Telephone Encounter (Signed)
Xanax LF 7/23, due 8/20

## 2023-03-16 NOTE — Telephone Encounter (Signed)
Paul Ray called at 9:34 to request that his prescription for Xanax be sent to [facility] on [address] [place], [place].  His Xanax is to always go to this pharmacy.  All his other medication go to [facility].

## 2023-03-17 ENCOUNTER — Other Ambulatory Visit: Payer: Self-pay | Admitting: Behavioral Health

## 2023-03-17 ENCOUNTER — Other Ambulatory Visit: Payer: Self-pay

## 2023-03-17 DIAGNOSIS — G479 Sleep disorder, unspecified: Secondary | ICD-10-CM

## 2023-03-17 DIAGNOSIS — F319 Bipolar disorder, unspecified: Secondary | ICD-10-CM

## 2023-03-17 MED ORDER — LAMOTRIGINE 200 MG PO TABS
200.0000 mg | ORAL_TABLET | Freq: Every day | ORAL | 1 refills | Status: DC
Start: 2023-03-17 — End: 2023-09-09

## 2023-03-17 MED ORDER — ALPRAZOLAM 1 MG PO TABS: 1.0000 mg | ORAL_TABLET | Freq: Three times a day (TID) | ORAL | 2 refills | Status: DC | PRN

## 2023-03-17 NOTE — Telephone Encounter (Signed)
Pended, canceled at [facility].

## 2023-04-01 ENCOUNTER — Telehealth: Payer: Self-pay | Admitting: Behavioral Health

## 2023-04-01 NOTE — Telephone Encounter (Signed)
Patient filled today. The pharmacy ran it wrong, reran it and it was $0.

## 2023-04-01 NOTE — Telephone Encounter (Signed)
Pt called at 9:04a requesting more samples of Vraylar and another assistance card. The medicine is still $400.  Next appt 2/12

## 2023-04-07 ENCOUNTER — Ambulatory Visit (INDEPENDENT_AMBULATORY_CARE_PROVIDER_SITE_OTHER): Payer: PPO | Admitting: Family Medicine

## 2023-04-07 ENCOUNTER — Encounter: Payer: Self-pay | Admitting: Family Medicine

## 2023-04-07 VITALS — BP 124/80 | HR 88 | Temp 97.9°F | Resp 17 | Ht 67.0 in | Wt 171.4 lb

## 2023-04-07 DIAGNOSIS — Z1152 Encounter for screening for COVID-19: Secondary | ICD-10-CM

## 2023-04-07 DIAGNOSIS — R053 Chronic cough: Secondary | ICD-10-CM

## 2023-04-07 LAB — POC COVID19 BINAXNOW: SARS Coronavirus 2 Ag: NEGATIVE

## 2023-04-07 MED ORDER — DOXYCYCLINE HYCLATE 100 MG PO TABS
100.0000 mg | ORAL_TABLET | Freq: Two times a day (BID) | ORAL | 0 refills | Status: AC
Start: 2023-04-07 — End: 2023-04-14

## 2023-04-07 NOTE — Patient Instructions (Addendum)
-  Rapid covid is negative.  -Ordered chest x-ray. Please go to the following location to have x-ray completed. Office will call with results.  [facility] or xray: Walk in 8:30-4:30 during weekdays, no appointment needed [address]  [place], [place] [address]  -START Doxycycline 100mg  twice a day for 7 days. If cough is improved, please send a message through MyChart or call the office if cough is not improved. Will refer you back to [facility].

## 2023-04-07 NOTE — Progress Notes (Signed)
Acute Office Visit   Subjective:  Patient ID: Paul Ray, male    DOB: 1956-04-23, 67 y.o.   MRN: [id number]  Chief Complaint  Patient presents with   Cough    Pt states he coughs up pelham daily  No chest pain  No shortness of breath      Cough   Patient is here for an acute visit. Patient is having complaining of a cough, varies between productive and non productive. Usually occurs 1-2 times a day.  He reports this cough has been occurring for the last few years. Denies chest pain, shortness of breath, sore throat, fatigue, headache, ear pain or pressure, fever, nausea, or vomiting.   Patient has had a CT Angio Chest with contrast on 11/10/2022 with no focal consolidation. Stable triangular pulmonary nodule consistent with an intrapulmonary lymph node, no further work up indication. No new pulmonary nodule or pulmonary mass. Then, a chest x-ray on 04/02/2022 with no acute chest finding.   Patient was seen at [facility] with Dr. Vilma Meckel on 09/06/2020 for chest pain. He was recommended to return if symptoms worse or fail to improve.   Review of Systems  Respiratory:  Positive for cough.   See HPI above      Objective:   BP 124/80   Pulse 88   Temp 97.9 F (36.6 C) (Temporal)   Resp 17   Ht 5\' 7"  (1.702 m)   Wt 171 lb 6 oz (77.7 kg)   SpO2 97%   BMI 26.84 kg/m    Physical Exam Vitals reviewed.  Constitutional:      General: He is not in acute distress.    Appearance: Normal appearance. He is not ill-appearing, toxic-appearing or diaphoretic.  HENT:     Head: Normocephalic and atraumatic.  Eyes:     General:        Right eye: No discharge.        Left eye: No discharge.     Conjunctiva/sclera: Conjunctivae normal.  Cardiovascular:     Rate and Rhythm: Normal rate and regular rhythm.     Heart sounds: Normal heart sounds. No murmur heard.    No friction rub. No gallop.  Pulmonary:     Effort: Pulmonary effort is normal. No respiratory  distress.     Breath sounds: Normal breath sounds.     Comments: No cough during visit.  Musculoskeletal:        General: Normal range of motion.  Skin:    General: Skin is warm and dry.  Neurological:     General: No focal deficit present.     Mental Status: He is alert and oriented to person, place, and time. Mental status is at baseline.  Psychiatric:        Mood and Affect: Mood normal.        Behavior: Behavior normal.        Thought Content: Thought content normal.        Judgment: Judgment normal.    Results for orders placed or performed in visit on 04/07/23  POC COVID-19  Result Value Ref Range   SARS Coronavirus 2 Ag Negative Negative      Assessment & Plan:  Chronic cough -     XR Chest 2 View -     Doxycycline Hyclate; Take 1 tablet (100 mg total) by mouth 2 (two) times daily for 7 days.  Dispense: 14 tablet; Refill: 0  Encounter for screening for COVID-19 -  POC COVID-19 BinaxNow  -Rapid covid is negative.  -Ordered chest x-ray. Please go to the following location to have x-ray completed. Office will call with results.  [facility] or xray: Walk in 8:30-4:30 during weekdays, no appointment needed [address]  [place], [place] [address]  -Prescribed Doxycycline 100mg  twice a day for 7 days. If cough is improved, please send a message through MyChart or call the office if cough is not improved. Will refer you back to [facility].  -Patient is not on an ACE or ARB and does seem to have GERD symptoms that can contribute to a chronic cough.   Zandra Abts, NP

## 2023-04-17 ENCOUNTER — Other Ambulatory Visit: Payer: Self-pay

## 2023-04-17 ENCOUNTER — Telehealth: Payer: Self-pay | Admitting: Behavioral Health

## 2023-04-17 DIAGNOSIS — F411 Generalized anxiety disorder: Secondary | ICD-10-CM

## 2023-04-17 DIAGNOSIS — F902 Attention-deficit hyperactivity disorder, combined type: Secondary | ICD-10-CM

## 2023-04-17 DIAGNOSIS — F331 Major depressive disorder, recurrent, moderate: Secondary | ICD-10-CM

## 2023-04-17 DIAGNOSIS — F3160 Bipolar disorder, current episode mixed, unspecified: Secondary | ICD-10-CM

## 2023-04-17 MED ORDER — AMPHETAMINE-DEXTROAMPHET ER 30 MG PO CP24
ORAL_CAPSULE | ORAL | 0 refills | Status: DC
Start: 2023-04-17 — End: 2023-05-18

## 2023-04-17 MED ORDER — AMPHETAMINE-DEXTROAMPHETAMINE 20 MG PO TABS
20.0000 mg | ORAL_TABLET | Freq: Every day | ORAL | 0 refills | Status: DC
Start: 2023-04-17 — End: 2023-05-18

## 2023-04-17 NOTE — Telephone Encounter (Signed)
Pended.

## 2023-04-17 NOTE — Telephone Encounter (Signed)
Pt called at 9:50a requesting refill of Adderall 20mg  and 30mg  XR to   [facility] [id number] [place], [place] - [address] [facility] [address] [place], [place] [address] Phone: [phone]  Fax: [phone]   He said refills are due tomorrow.  Next appt 2/12

## 2023-05-04 ENCOUNTER — Ambulatory Visit: Payer: PPO | Admitting: Orthopaedic Surgery

## 2023-05-05 ENCOUNTER — Telehealth: Payer: Self-pay | Admitting: Family Medicine

## 2023-05-05 NOTE — Telephone Encounter (Signed)
Pt is insisting on being established here with new PCP

## 2023-05-05 NOTE — Telephone Encounter (Signed)
Caller name: DEQUAVION FOLLETTE  On DPR?: Yes  Call back number: [phone] (mobile)  Provider they see: Alveria Apley, NP  Reason for call:   Pt joined the [facility] because of its location. And doesn't want to re-locate. He would like a return call from the PM [place].

## 2023-05-05 NOTE — Telephone Encounter (Signed)
I understand his frustration, but at this time, he will need to be patient.  We are looking to bring someone new to the office and he may be able to establish with them.  At this time, I am not able to accept

## 2023-05-06 ENCOUNTER — Telehealth: Payer: Self-pay | Admitting: Orthopedic Surgery

## 2023-05-06 ENCOUNTER — Other Ambulatory Visit: Payer: Self-pay | Admitting: Radiology

## 2023-05-06 DIAGNOSIS — M5116 Intervertebral disc disorders with radiculopathy, lumbar region: Secondary | ICD-10-CM

## 2023-05-06 DIAGNOSIS — M5416 Radiculopathy, lumbar region: Secondary | ICD-10-CM

## 2023-05-06 DIAGNOSIS — M48062 Spinal stenosis, lumbar region with neurogenic claudication: Secondary | ICD-10-CM

## 2023-05-06 NOTE — Telephone Encounter (Signed)
Pt called with medical question about his back. Pt has an upcoming appt but Dr Christell Constant is booked to the end of the month. Pt asking what can he do to help pain. Heat or Ice. Please call pt about this matter at [phone].

## 2023-05-06 NOTE — Telephone Encounter (Signed)
I called and spoke with patient he states that he is having the same pain as before and all he did was bent over to shave on Saturday when it started hurting again. I advised that we can order another injection since he did 6.5 months relief with that injection and he agrees with this.

## 2023-05-08 NOTE — Telephone Encounter (Signed)
Spoke to patient and let him know that we can see him here for acute visits in the meantime until we get a new provider. Let him know that we did interview yesterday and am hopeful to have someone in here by January/February.

## 2023-05-11 ENCOUNTER — Telehealth: Payer: Self-pay | Admitting: Physical Medicine and Rehabilitation

## 2023-05-11 NOTE — Telephone Encounter (Signed)
Pt returned call to [place] to set an appt for back injection. Please call pt to set an appt. Pt phone number is [phone]

## 2023-05-12 ENCOUNTER — Other Ambulatory Visit: Payer: Self-pay | Admitting: Physical Medicine and Rehabilitation

## 2023-05-12 DIAGNOSIS — M5416 Radiculopathy, lumbar region: Secondary | ICD-10-CM

## 2023-05-12 MED ORDER — DIAZEPAM 5 MG PO TABS
ORAL_TABLET | ORAL | 0 refills | Status: DC
Start: 2023-05-12 — End: 2023-12-17

## 2023-05-12 NOTE — Telephone Encounter (Signed)
Spoke with patient and scheduled injection for 05/14/23. Patient aware driver needed

## 2023-05-12 NOTE — Telephone Encounter (Signed)
Patient is scheduled for injection on 05/14/23. Please send in Valium

## 2023-05-14 ENCOUNTER — Ambulatory Visit: Payer: PPO | Admitting: Physical Medicine and Rehabilitation

## 2023-05-14 ENCOUNTER — Other Ambulatory Visit: Payer: Self-pay

## 2023-05-14 DIAGNOSIS — M5416 Radiculopathy, lumbar region: Secondary | ICD-10-CM | POA: Diagnosis not present

## 2023-05-14 MED ORDER — BUPIVACAINE HCL 0.5 % IJ SOLN: 3.0000 mL | Freq: Once | INTRAMUSCULAR | Status: DC

## 2023-05-14 NOTE — Patient Instructions (Signed)

## 2023-05-14 NOTE — Progress Notes (Signed)
Functional Pain Scale - descriptive words and definitions  Unmanageable (7)  Pain interferes with normal ADL's/nothing seems to help/sleep is very difficult/active distractions are very difficult to concentrate on. Severe range order  Average Pain 8   +Driver, -BT, -Dye Allergies. Yes, no, no  150/90 66 97%

## 2023-05-15 ENCOUNTER — Telehealth: Payer: Self-pay | Admitting: Behavioral Health

## 2023-05-15 NOTE — Telephone Encounter (Signed)
Next visit is 09/08/22. Requesting refills for both Adderall 20 mg and Adderall XR 30 mg sent to:  [facility] [id number] [place], [place] - [address]   Phone: [phone]  Fax: [phone]    It is in stock.

## 2023-05-15 NOTE — Telephone Encounter (Signed)
LF 09/23; NOT DUE UNTIL 10/21

## 2023-05-18 ENCOUNTER — Other Ambulatory Visit: Payer: Self-pay

## 2023-05-18 DIAGNOSIS — F3160 Bipolar disorder, current episode mixed, unspecified: Secondary | ICD-10-CM

## 2023-05-18 DIAGNOSIS — F411 Generalized anxiety disorder: Secondary | ICD-10-CM

## 2023-05-18 DIAGNOSIS — F331 Major depressive disorder, recurrent, moderate: Secondary | ICD-10-CM

## 2023-05-18 DIAGNOSIS — F902 Attention-deficit hyperactivity disorder, combined type: Secondary | ICD-10-CM

## 2023-05-18 MED ORDER — AMPHETAMINE-DEXTROAMPHETAMINE 20 MG PO TABS
20.0000 mg | ORAL_TABLET | Freq: Every day | ORAL | 0 refills | Status: DC
Start: 2023-05-18 — End: 2023-06-12

## 2023-05-18 MED ORDER — AMPHETAMINE-DEXTROAMPHET ER 30 MG PO CP24: ORAL_CAPSULE | ORAL | 0 refills | Status: DC

## 2023-05-18 NOTE — Telephone Encounter (Signed)
Pended.

## 2023-05-21 ENCOUNTER — Ambulatory Visit: Payer: PPO | Admitting: Orthopedic Surgery

## 2023-05-21 DIAGNOSIS — M545 Low back pain, unspecified: Secondary | ICD-10-CM

## 2023-05-21 DIAGNOSIS — G8929 Other chronic pain: Secondary | ICD-10-CM | POA: Diagnosis not present

## 2023-05-21 NOTE — Progress Notes (Signed)
Orthopedic Spine Surgery Office Note   Assessment: Patient is a 67 y.o. male with low back pain that has improved significantly since getting an injection..  He has a L1/2 disc herniation, L1/2 central stenosis, L2/3 central lateral recess stenosis     Plan: -Patient has tried Tylenol, NSAIDs, Percocet, lumbar steroid injections -Since he has been getting significant relief (70%) with lumbar steroid injections and they last for 6 to 7 months, I recommended continuing that treatment -I told him if pain becomes significant again, I can put in a referral for another injection -Patient will return to the office on an as needed basis   Patient expressed understanding and is in agreement with the plan ___________________________________________________________________________     History:   Patient is a 67 y.o. male who presents today for follow-up on his lumbar spine.  Patient had been previously seen in the office for low back pain that radiated into the right lower extremity back in March 2024.  He got better with conservative treatments, but within the last couple of weeks has noted worsening of his low back pain.  He states he was shaving when he first noted the pain.  He felt that around the area of the lower back.  He did not have any pain radiating into either lower extremity.  He called the office and was able to get an injection.  He said that he is 70% better since getting the injection.  Pain is currently tolerable.  He is taking Aleve but not taking any other medications for pain.  He has been able to do more since pain has gotten better.  Denies paresthesia numbness.  No saddle anesthesia.  No bowel or bladder incontinence.    Treatments tried: Tylenol, NSAIDs, Percocet, lumbar steroid injections     Physical Exam:   General: no acute distress, appears stated age Neurologic: alert, answering questions appropriately, following commands Respiratory: unlabored breathing on room air,  symmetric chest rise Psychiatric: appropriate affect, normal cadence to speech     MSK (spine):   -Strength exam                                                   Left                  Right EHL                              4/5                  4/5 TA                                 5/5                  5/5 GSC                             5/5                  5/5 Knee extension            5/5  5/5 Hip flexion                    5/5                  5/5   -Sensory exam                           Sensation intact to light touch in L3-S1 nerve distributions of bilateral lower extremities   -Achilles DTR: 2/4 on the left, 2/4 on the right -Patellar tendon DTR: 2/4 on the left, 2/4 on the right   -Straight leg raise: Negative bilaterally -Femoral nerve stretch test: Negative bilaterally -Clonus: no beats bilaterally   -Left hip exam: No pain through range of motion, negative Stinchfield, negative FABER -Right hip exam: No pain through range of motion, negative Stinchfield, negative FABER   Imaging: XR of the lumbar spine from 10/06/2022 was previously independently reviewed and interpreted, showing bilateral hip resurfacing's with the appearance of HO in the area of the left hip. Grade 1 spondylolisthesis at L4/5.  No evidence of instability on flexion/extension views.  No fracture or dislocation.   MRI of the lumbar spine from 10/01/2022 was previously independently reviewed and interpreted, showing L1/2 disc herniation with caudal migration on the right side.  There is central stenosis at that level as well.  There is central and lateral recess stenosis at L2/3.  Mild foraminal stenosis bilaterally at L1/2 and L2/3.  Prior hemilaminotomy at L3/4 and L4/5.     Patient name: Paul Ray Patient MRN: [id number] Date of visit: 05/21/23

## 2023-05-26 NOTE — Procedures (Signed)
Lumbosacral Transforaminal Epidural Steroid Injection - Sub-Pedicular Approach with Fluoroscopic Guidance  Patient: Paul Ray      Date of Birth: 03/03/1956 MRN: [id number] PCP: Alveria Apley, NP      Visit Date: 05/14/2023   Universal Protocol:    Date/Time: 05/14/2023  Consent Given By: the patient  Position: PRONE  Additional Comments: Vital signs were monitored before and after the procedure. Patient was prepped and draped in the usual sterile fashion. The correct patient, procedure, and site was verified.   Injection Procedure Details:   Procedure diagnoses: Lumbar radiculopathy [M54.16]    Meds Administered:  Meds ordered this encounter  Medications   DISCONTD: bupivacaine (MARCAINE) 0.5 % (with pres) injection 3 mL    Laterality: Bilateral  Location/Site: L1  Needle:5.0 in., 22 ga.  Short bevel or Quincke spinal needle  Needle Placement: Transforaminal  Findings:    -Comments: Excellent flow of contrast along the nerve, nerve root and into the epidural space.  Procedure Details: After squaring off the end-plates to get a true AP view, the C-arm was positioned so that an oblique view of the foramen as noted above was visualized. The target area is just inferior to the "nose of the scotty dog" or sub pedicular. The soft tissues overlying this structure were infiltrated with 2-3 ml. of 1% Lidocaine without Epinephrine.  The spinal needle was inserted toward the target using a "trajectory" view along the fluoroscope beam.  Under AP and lateral visualization, the needle was advanced so it did not puncture dura and was located close the 6 O'Clock position of the pedical in AP tracterory. Biplanar projections were used to confirm position. Aspiration was confirmed to be negative for CSF and/or blood. A 1-2 ml. volume of Isovue-250 was injected and flow of contrast was noted at each level. Radiographs were obtained for documentation purposes.   After attaining  the desired flow of contrast documented above, a 0.5 to 1.0 ml test dose of 0.25% Marcaine was injected into each respective transforaminal space.  The patient was observed for 90 seconds post injection.  After no sensory deficits were reported, and normal lower extremity motor function was noted,   the above injectate was administered so that equal amounts of the injectate were placed at each foramen (level) into the transforaminal epidural space.   Additional Comments:  No complications occurred Dressing: 2 x 2 sterile gauze and Band-Aid    Post-procedure details: Patient was observed during the procedure. Post-procedure instructions were reviewed.  Patient left the clinic in stable condition.

## 2023-05-26 NOTE — Progress Notes (Signed)
Paul Ray - 67 y.o. male MRN [id number]  Date of birth: 08/29/1955  Office Visit Note: Visit Date: 05/14/2023 PCP: Alveria Apley, NP Referred by: London Sheer, MD  Subjective: No chief complaint on file.  HPI:  Paul Ray is a 67 y.o. male who comes in today at the request of Dr. Willia Craze for planned Bilateral L1-2 Lumbar Transforaminal epidural steroid injection with fluoroscopic guidance.  The patient has failed conservative care including home exercise, medications, time and activity modification.  This injection will be diagnostic and hopefully therapeutic.  Please see requesting physician notes for further details and justification.   ROS Otherwise per HPI.  Assessment & Plan: Visit Diagnoses:    ICD-10-CM   1. Lumbar radiculopathy  M54.16 XR C-ARM NO REPORT    Epidural Steroid injection    DISCONTINUED: bupivacaine (MARCAINE) 0.5 % (with pres) injection 3 mL      Plan: No additional findings.   Meds & Orders:  Meds ordered this encounter  Medications   DISCONTD: bupivacaine (MARCAINE) 0.5 % (with pres) injection 3 mL    Orders Placed This Encounter  Procedures   XR C-ARM NO REPORT   Epidural Steroid injection    Follow-up: Return for visit to requesting provider as needed.   Procedures: No procedures performed  Lumbosacral Transforaminal Epidural Steroid Injection - Sub-Pedicular Approach with Fluoroscopic Guidance  Patient: Paul Ray      Date of Birth: November 15, 1955 MRN: [id number] PCP: Alveria Apley, NP      Visit Date: 05/14/2023   Universal Protocol:    Date/Time: 05/14/2023  Consent Given By: the patient  Position: PRONE  Additional Comments: Vital signs were monitored before and after the procedure. Patient was prepped and draped in the usual sterile fashion. The correct patient, procedure, and site was verified.   Injection Procedure Details:   Procedure diagnoses: Lumbar radiculopathy [M54.16]    Meds  Administered:  Meds ordered this encounter  Medications   DISCONTD: bupivacaine (MARCAINE) 0.5 % (with pres) injection 3 mL    Laterality: Bilateral  Location/Site: L1  Needle:5.0 in., 22 ga.  Short bevel or Quincke spinal needle  Needle Placement: Transforaminal  Findings:    -Comments: Excellent flow of contrast along the nerve, nerve root and into the epidural space.  Procedure Details: After squaring off the end-plates to get a true AP view, the C-arm was positioned so that an oblique view of the foramen as noted above was visualized. The target area is just inferior to the "nose of the scotty dog" or sub pedicular. The soft tissues overlying this structure were infiltrated with 2-3 ml. of 1% Lidocaine without Epinephrine.  The spinal needle was inserted toward the target using a "trajectory" view along the fluoroscope beam.  Under AP and lateral visualization, the needle was advanced so it did not puncture dura and was located close the 6 O'Clock position of the pedical in AP tracterory. Biplanar projections were used to confirm position. Aspiration was confirmed to be negative for CSF and/or blood. A 1-2 ml. volume of Isovue-250 was injected and flow of contrast was noted at each level. Radiographs were obtained for documentation purposes.   After attaining the desired flow of contrast documented above, a 0.5 to 1.0 ml test dose of 0.25% Marcaine was injected into each respective transforaminal space.  The patient was observed for 90 seconds post injection.  After no sensory deficits were reported, and normal lower extremity motor function was noted,  the above injectate was administered so that equal amounts of the injectate were placed at each foramen (level) into the transforaminal epidural space.   Additional Comments:  No complications occurred Dressing: 2 x 2 sterile gauze and Band-Aid    Post-procedure details: Patient was observed during the procedure. Post-procedure  instructions were reviewed.  Patient left the clinic in stable condition.    Clinical History: MRI LUMBAR SPINE WITHOUT CONTRAST   TECHNIQUE: Multiplanar, multisequence MR imaging of the lumbar spine was performed. No intravenous contrast was administered.   COMPARISON:  Lumbar spine MRI 07/14/2021.   FINDINGS: Segmentation: Conventional numbering is assumed with 5 non-rib-bearing, lumbar type vertebral bodies.   Alignment: 3 mm anterolisthesis of L4 on L5. Trace retrolisthesis of L1 on L2.   Vertebrae: Modic type 1 degenerative endplate marrow signal changes at L1-2.   Conus medullaris and cauda equina: Conus extends to the L1 level. Conus and cauda equina appear normal.   Paraspinal and other soft tissues: Unremarkable.   Disc levels:   Congenitally short pedicles throughout the lumbar spine.   T12-L1: Small disc bulge without spinal canal stenosis or neural foraminal narrowing.   L1-L2: Worsening disc bulge with new superimposed right central disc extrusion caudal migration measuring a proximally 14 mm (sagittal image 7 series 3), results in severe spinal canal stenosis with superimposed compression of the traversing nerve roots in the right lateral recess. Facet arthropathy contributes to mild bilateral neural foraminal narrowing, unchanged.   L2-L3: Slightly increased disc bulge and facet arthropathy results in severe spinal canal stenosis, worse from prior, and mild bilateral neural foraminal narrowing, unchanged.   L3-L4: Prior right hemilaminotomy. Interval resolution of previously seen central disc extrusion. Residual disc bulge contributes to moderate spinal canal stenosis, improved from prior. Bilateral facet arthropathy contributes to mild bilateral neural foraminal narrowing, unchanged.   L4-L5: Disc bulge and facet arthropathy results in mild spinal canal stenosis and mild bilateral neural foraminal narrowing, unchanged.   L5-S1: Small disc bulge  without spinal canal stenosis. Bilateral facet arthropathy results in moderate left and mild right neural foraminal narrowing, unchanged.   IMPRESSION: 1. Worsening degenerative disc disease at L1-L2 with a new right central disc extrusion and caudal migration, resulting in severe spinal canal stenosis with superimposed compression of the traversing nerve roots in the right lateral recess. 2. Slightly increased disc bulge at L2-L3 results in severe spinal canal stenosis, worse from prior. 3. Prior right hemilaminotomy at L3-L4. Interval resolution of previously seen central disc extrusion. Residual disc bulge contributes to moderate spinal canal stenosis, improved from prior.     Electronically Signed   By: Orvan Falconer M.D.   On: 10/01/2022 14:24     Objective:  VS:  HT:    WT:   BMI:     BP:   HR: bpm  TEMP: ( )  RESP:  Physical Exam Vitals and nursing note reviewed.  Constitutional:      General: He is not in acute distress.    Appearance: Normal appearance. He is not ill-appearing.  HENT:     Head: Normocephalic and atraumatic.     Right Ear: External ear normal.     Left Ear: External ear normal.     Nose: No congestion.  Eyes:     Extraocular Movements: Extraocular movements intact.  Cardiovascular:     Rate and Rhythm: Normal rate.     Pulses: Normal pulses.  Pulmonary:     Effort: Pulmonary effort is normal. No respiratory distress.  Abdominal:     General: There is no distension.     Palpations: Abdomen is soft.  Musculoskeletal:        General: No tenderness or signs of injury.     Cervical back: Neck supple.     Right lower leg: No edema.     Left lower leg: No edema.     Comments: Patient has good distal strength without clonus.  Skin:    Findings: No erythema or rash.  Neurological:     General: No focal deficit present.     Mental Status: He is alert and oriented to person, place, and time.     Sensory: No sensory deficit.     Motor: No  weakness or abnormal muscle tone.     Coordination: Coordination normal.  Psychiatric:        Mood and Affect: Mood normal.        Behavior: Behavior normal.      Imaging: No results found.

## 2023-05-28 ENCOUNTER — Ambulatory Visit: Payer: PPO | Admitting: Orthopedic Surgery

## 2023-06-11 ENCOUNTER — Telehealth: Payer: Self-pay | Admitting: Behavioral Health

## 2023-06-11 NOTE — Telephone Encounter (Signed)
Adderall LF 10/22, due 11/19. Alprazolam LF 10/21, due 11/18. Will pend tomorrow, even though he knows he can't fill until due dates.

## 2023-06-11 NOTE — Telephone Encounter (Signed)
Pt called at 12:01p requesting refill of Adderall 20mg  and Adderall XR 30 mg to  [facility] [id number] [place], [place] - [address] [facility] [address] [place], [place] [address] Phone: [phone]  Fax: [phone]    And Xanax to  [id number] - [place], [place] - [address] AT [address] [place] [address] Phone: [phone]  Fax: [phone]   He said the pharmacy told him they didn't see the refill.    Next appt 2/12

## 2023-06-12 ENCOUNTER — Other Ambulatory Visit: Payer: Self-pay | Admitting: Behavioral Health

## 2023-06-12 ENCOUNTER — Other Ambulatory Visit: Payer: Self-pay

## 2023-06-12 DIAGNOSIS — F3160 Bipolar disorder, current episode mixed, unspecified: Secondary | ICD-10-CM

## 2023-06-12 DIAGNOSIS — F411 Generalized anxiety disorder: Secondary | ICD-10-CM

## 2023-06-12 DIAGNOSIS — G479 Sleep disorder, unspecified: Secondary | ICD-10-CM

## 2023-06-12 DIAGNOSIS — F331 Major depressive disorder, recurrent, moderate: Secondary | ICD-10-CM

## 2023-06-12 DIAGNOSIS — F902 Attention-deficit hyperactivity disorder, combined type: Secondary | ICD-10-CM

## 2023-06-12 MED ORDER — ALPRAZOLAM 1 MG PO TABS: 1.0000 mg | ORAL_TABLET | Freq: Three times a day (TID) | ORAL | 3 refills | Status: DC | PRN

## 2023-06-12 MED ORDER — AMPHETAMINE-DEXTROAMPHETAMINE 20 MG PO TABS: 20.0000 mg | ORAL_TABLET | Freq: Every day | ORAL | 0 refills | Status: DC

## 2023-06-12 MED ORDER — AMPHETAMINE-DEXTROAMPHET ER 30 MG PO CP24: ORAL_CAPSULE | ORAL | 0 refills | Status: DC

## 2023-06-12 NOTE — Telephone Encounter (Signed)
Pended.

## 2023-07-13 ENCOUNTER — Telehealth: Payer: Self-pay | Admitting: Behavioral Health

## 2023-07-13 NOTE — Telephone Encounter (Signed)
Patient is due for RF on 12/18.

## 2023-07-13 NOTE — Telephone Encounter (Signed)
Pt called and said that he needs refills on his adderall xr 30 mg and adderall 20 mg. Pharmacy is [facility] [address]

## 2023-07-13 NOTE — Telephone Encounter (Signed)
 LF 11/20, due 12/18

## 2023-07-14 ENCOUNTER — Other Ambulatory Visit: Payer: Self-pay

## 2023-07-14 DIAGNOSIS — F411 Generalized anxiety disorder: Secondary | ICD-10-CM

## 2023-07-14 DIAGNOSIS — F902 Attention-deficit hyperactivity disorder, combined type: Secondary | ICD-10-CM

## 2023-07-14 DIAGNOSIS — G479 Sleep disorder, unspecified: Secondary | ICD-10-CM

## 2023-07-14 DIAGNOSIS — F331 Major depressive disorder, recurrent, moderate: Secondary | ICD-10-CM

## 2023-07-14 DIAGNOSIS — F3160 Bipolar disorder, current episode mixed, unspecified: Secondary | ICD-10-CM

## 2023-07-14 MED ORDER — ALPRAZOLAM 1 MG PO TABS: 1.0000 mg | ORAL_TABLET | Freq: Three times a day (TID) | ORAL | 2 refills | Status: DC | PRN

## 2023-07-14 MED ORDER — AMPHETAMINE-DEXTROAMPHETAMINE 20 MG PO TABS: 20.0000 mg | ORAL_TABLET | Freq: Every day | ORAL | 0 refills | Status: DC

## 2023-07-14 MED ORDER — AMPHETAMINE-DEXTROAMPHET ER 30 MG PO CP24: ORAL_CAPSULE | ORAL | 0 refills | Status: DC

## 2023-07-14 NOTE — Telephone Encounter (Signed)
Patient called regarding previous messages he doesn't need for Adderall to be sent to [facility] he needs his Xanax 1mg  sent to [facility] [address] [place] Ph: [phone] Appt 2/12

## 2023-07-14 NOTE — Telephone Encounter (Signed)
Called patient, as he has previously said that [facility] and he currently has RF available there. He said he now wants Xanax to go to [facility].

## 2023-07-14 NOTE — Telephone Encounter (Signed)
Alprazolam RF sent to [facility]. Canceled at [facility].

## 2023-07-14 NOTE — Telephone Encounter (Signed)
Pended Adderall XR 30 and 20 IR to [facility].

## 2023-07-24 NOTE — Telephone Encounter (Signed)
error 

## 2023-08-13 ENCOUNTER — Telehealth: Payer: Self-pay | Admitting: Behavioral Health

## 2023-08-13 ENCOUNTER — Other Ambulatory Visit: Payer: Self-pay

## 2023-08-13 DIAGNOSIS — F902 Attention-deficit hyperactivity disorder, combined type: Secondary | ICD-10-CM

## 2023-08-13 DIAGNOSIS — F411 Generalized anxiety disorder: Secondary | ICD-10-CM

## 2023-08-13 DIAGNOSIS — F331 Major depressive disorder, recurrent, moderate: Secondary | ICD-10-CM

## 2023-08-13 DIAGNOSIS — F3160 Bipolar disorder, current episode mixed, unspecified: Secondary | ICD-10-CM

## 2023-08-13 MED ORDER — AMPHETAMINE-DEXTROAMPHETAMINE 20 MG PO TABS: 20.0000 mg | ORAL_TABLET | Freq: Every day | ORAL | 0 refills | Status: DC

## 2023-08-13 MED ORDER — AMPHETAMINE-DEXTROAMPHET ER 30 MG PO CP24: ORAL_CAPSULE | ORAL | 0 refills | Status: DC

## 2023-08-13 NOTE — Telephone Encounter (Signed)
LF 07/29/17

## 2023-08-13 NOTE — Telephone Encounter (Signed)
Pended Adderall 30XR and 20 IR to [facility].

## 2023-08-13 NOTE — Telephone Encounter (Signed)
PT lvm that he needs refills on his adderall xr 30 mg and adderall 20 mg. Pharmacy is [facility]  on [address]

## 2023-08-17 ENCOUNTER — Telehealth: Payer: Self-pay | Admitting: Orthopedic Surgery

## 2023-08-17 NOTE — Telephone Encounter (Signed)
Pt called requesting a call from Dr August Saucer, PA Franky Macho, or Kathryne Gin. Pt would like to know what exercises he can do to help with shoulder pains. Last appt with August Saucer 2023. Pt did not say he would like an appt just a call back. Pt phone number is [phone].

## 2023-08-18 NOTE — Telephone Encounter (Signed)
As a rule, I would avoid "pressing" exercises such as bench press, pushups, overhead lifting in general.  Some low weight row exercises with machines or cables would be a safe try I think.  If he would like a formal exercise program from PT we can set that up or see him for shoulder injection/further discussion if he would like

## 2023-08-20 NOTE — Telephone Encounter (Signed)
LMOM for patient of the below message  

## 2023-08-24 ENCOUNTER — Ambulatory Visit: Payer: PPO | Admitting: Behavioral Health

## 2023-09-01 ENCOUNTER — Telehealth: Payer: Self-pay | Admitting: Orthopaedic Surgery

## 2023-09-01 NOTE — Telephone Encounter (Signed)
Patient called asked if he can go to the gym and work out before the injection? Patient said he generally go to the gym around 5:00 pm.    [phone]

## 2023-09-02 ENCOUNTER — Ambulatory Visit: Payer: PPO | Admitting: Orthopaedic Surgery

## 2023-09-07 ENCOUNTER — Ambulatory Visit: Payer: PPO | Admitting: Orthopaedic Surgery

## 2023-09-07 ENCOUNTER — Encounter: Payer: Self-pay | Admitting: Orthopaedic Surgery

## 2023-09-07 DIAGNOSIS — M25511 Pain in right shoulder: Secondary | ICD-10-CM

## 2023-09-07 DIAGNOSIS — G8929 Other chronic pain: Secondary | ICD-10-CM

## 2023-09-07 MED ORDER — LIDOCAINE HCL 1 % IJ SOLN
3.0000 mL | INTRAMUSCULAR | Status: AC | PRN
  Administered 2023-09-07: 3 mL

## 2023-09-07 MED ORDER — METHYLPREDNISOLONE ACETATE 40 MG/ML IJ SUSP
40.0000 mg | INTRAMUSCULAR | Status: AC | PRN
  Administered 2023-09-07: 40 mg via INTRA_ARTICULAR

## 2023-09-07 NOTE — Progress Notes (Signed)
 [name] comes in today with chronic right shoulder pain that is flared up recently.  He still exercising regular basis but he has done modification to his workout routine to take pressure off of his shoulder.  We know that he has significant arthritis of the right shoulder and he has seen my partner Dr. Rozelle Corning in the past who recommended shoulder replacement.  He is 68 years old.  He is not a diabetic.  He comes in today requesting a steroid injection in his right shoulder.  His right shoulder fortunately does move smoothly.  There is some integrity still of left of the rotator cuff.  There is painful arc of motion of the shoulder.  Per his request I did place a steroid injection in the right shoulder subacromial alla which he tolerated well.  Follow-up can be as needed.  All questions and concerns were addressed and answered.    Procedure Note  Patient: Paul Ray             Date of Birth: 1955-12-02           MRN: [id number]             Visit Date: 09/07/2023  Procedures: Visit Diagnoses:  1. Chronic right shoulder pain     Large Joint Inj: R subacromial bursa on 09/07/2023 8:39 AM Indications: pain and diagnostic evaluation Details: 22 G 1.5 in needle  Arthrogram: No  Medications: 3 mL lidocaine  1 %; 40 mg methylPREDNISolone  acetate 40 MG/ML Outcome: tolerated well, no immediate complications Procedure, treatment alternatives, risks and benefits explained, specific risks discussed. Consent was given by the patient. Immediately prior to procedure a time out was called to verify the correct patient, procedure, equipment, support staff and site/side marked as required. Patient was prepped and draped in the usual sterile fashion.

## 2023-09-09 ENCOUNTER — Encounter: Payer: Self-pay | Admitting: Behavioral Health

## 2023-09-09 ENCOUNTER — Ambulatory Visit: Payer: PPO | Admitting: Behavioral Health

## 2023-09-09 DIAGNOSIS — F902 Attention-deficit hyperactivity disorder, combined type: Secondary | ICD-10-CM | POA: Diagnosis not present

## 2023-09-09 DIAGNOSIS — F331 Major depressive disorder, recurrent, moderate: Secondary | ICD-10-CM

## 2023-09-09 DIAGNOSIS — F411 Generalized anxiety disorder: Secondary | ICD-10-CM | POA: Diagnosis not present

## 2023-09-09 DIAGNOSIS — F3160 Bipolar disorder, current episode mixed, unspecified: Secondary | ICD-10-CM

## 2023-09-09 DIAGNOSIS — F319 Bipolar disorder, unspecified: Secondary | ICD-10-CM

## 2023-09-09 MED ORDER — AMPHETAMINE-DEXTROAMPHETAMINE 20 MG PO TABS: 20.0000 mg | ORAL_TABLET | Freq: Every day | ORAL | 0 refills | Status: DC

## 2023-09-09 MED ORDER — AMPHETAMINE-DEXTROAMPHET ER 30 MG PO CP24: ORAL_CAPSULE | ORAL | 0 refills | Status: DC

## 2023-09-09 MED ORDER — LAMOTRIGINE 200 MG PO TABS: 200.0000 mg | ORAL_TABLET | Freq: Every day | ORAL | 1 refills | Status: DC

## 2023-09-09 MED ORDER — CARIPRAZINE HCL 1.5 MG PO CAPS: 1.5000 mg | ORAL_CAPSULE | Freq: Every day | ORAL | 5 refills | Status: DC

## 2023-09-09 NOTE — Progress Notes (Signed)
[facility]  Patient ID: HAU SANOR,  MRN: [id number]  PCP: Alveria Apley, NP  Date of Evaluation: 09/09/2023 Time spent:30 minutes  Chief Complaint:  Chief Complaint   Anxiety; Depression; Follow-up; Medication Refill; Patient Education     HISTORY/CURRENT STATUS: HPI 68 year old male presents to this office for follow up and medication management. Very talkative today. Cooperative and very nice today.  He has been upset about his gym situation. Says he has been "very verbal with the admin at the [facility] because pickel ball players  are taking over the gym and interfering with his basketball.  He admits he does not want to destroy his standing there or membership.  He is very content with his medication right now and says [name] has been working great. Requesting no changes this visit.  His anxiety today is 2/10 and depression is 1/10.  He insist on 6 mo f/u's. He is sleeping 6-7 hours per night.  Trazodone help a little. He denies current mania, no current psychosis, no SI/HI. He says he will request medical records.   Prior psychiatric medications as reported: Zoloft Wellbutrin Prozac Abilify Seroquel Zyprexa      Individual Medical History/ Review of Systems: Changes? :No   Allergies: Quinolones  Current Medications:  Current Outpatient Medications:    ALPRAZolam (XANAX) 1 MG tablet, Take 1 tablet (1 mg total) by mouth 3 (three) times daily as needed for anxiety., Disp: 90 tablet, Rfl: 2   [START ON 09/12/2023] amphetamine-dextroamphetamine (ADDERALL XR) 30 MG 24 hr capsule, Take 1 capsule (30 mg) by mouth every morning., Disp: 30 capsule, Rfl: 0   [START ON 09/12/2023] amphetamine-dextroamphetamine (ADDERALL) 20 MG tablet, Take 1 tablet (20 mg total) by mouth daily., Disp: 30 tablet, Rfl: 0   aspirin EC 81 MG tablet, Take 1 tablet (81 mg total) by mouth daily. Swallow whole., Disp: 90 tablet, Rfl: 3   cariprazine (VRAYLAR) 1.5 MG capsule, Take 1 capsule  (1.5 mg total) by mouth daily., Disp: 30 capsule, Rfl: 5   diazepam (VALIUM) 5 MG tablet, Take one tablet by mouth with food one hour prior to procedure. May repeat 30 minutes prior if needed., Disp: 2 tablet, Rfl: 0   lamoTRIgine (LAMICTAL) 200 MG tablet, Take 1 tablet (200 mg total) by mouth daily., Disp: 90 tablet, Rfl: 1   silodosin (RAPAFLO) 8 MG CAPS capsule, Take 8 mg by mouth daily., Disp: , Rfl:  Medication Side Effects: none  Family Medical/ Social History: Changes? No  MENTAL HEALTH EXAM:  There were no vitals taken for this visit.There is no height or weight on file to calculate BMI.  General Appearance: Casual, Neat, and Well Groomed  Eye Contact:  Good  Speech:  Clear and Coherent  Volume:  Normal  Mood:  NA  Affect:  Congruent  Thought Process:  Coherent  Orientation:  Full (Time, Place, and Person)  Thought Content: NA   Suicidal Thoughts:  No  Homicidal Thoughts:  No  Memory:  WNL  Judgement:  Good  Insight:  Good  Psychomotor Activity:  Normal  Concentration:  Concentration: Good  Recall:  Good  Fund of Knowledge: NA  Language: Good  Assets:  Desire for Improvement  ADL's:  Intact  Cognition: WNL  Prognosis:  Good    DIAGNOSES:    ICD-10-CM   1. Bipolar depression (HCC)  F31.9 lamoTRIgine (LAMICTAL) 200 MG tablet    2. Generalized anxiety disorder  F41.1 amphetamine-dextroamphetamine (ADDERALL XR) 30 MG 24 hr  capsule    cariprazine (VRAYLAR) 1.5 MG capsule    3. Attention deficit hyperactivity disorder (ADHD), combined type  F90.2 amphetamine-dextroamphetamine (ADDERALL) 20 MG tablet    4. Bipolar I disorder, most recent episode mixed (HCC)  F31.60 amphetamine-dextroamphetamine (ADDERALL XR) 30 MG 24 hr capsule    cariprazine (VRAYLAR) 1.5 MG capsule    5. Major depressive disorder, recurrent episode, moderate (HCC)  F33.1 amphetamine-dextroamphetamine (ADDERALL XR) 30 MG 24 hr capsule      Receiving Psychotherapy: No    RECOMMENDATIONS:   Greater than 50% of 30  min face to face time with patient was spent on counseling and coordination of care.  He is calm and collected this visit which is pleasant to see with him.  Very talkative and some signs of mild mania. This is common for him and he can become easily agitated if provoked. Overall functions ok in social settings. He has good relationship with daughter and enjoys watching her soccer games which gives him a lot of joy. We talked about his current stability and he is very content right now. Very involved with his daughter who are both in college. Would like to continue with 6 mo f/u.     We agreed today to:   Continue on Vraylar 1.5 mg daily. capsule daily.  Continue Adderall. 30 mg ER in the am and 20 mg IR in the afternoon. Educated Pt that 60 mg was my prescribing limit per day.  Continue Trazodone 50 mg at bedtime daily Continue Lamictal 200 mg daily To report worsening symptoms or side effects promptly. Provided emergency contact information Discussed potential benefits, risks, and side effects of stimulants with patient to include increased heart rate, palpitations, insomnia, increased anxiety, increased irritability, or decreased appetite.  Instructed patient to contact office if experiencing any significant tolerability issues.  Discussed potential metabolic side effects associated with atypical antipsychotics, as well as potential risk for movement side effects. Advised pt to contact office if movement side effects occur.   Discussed potential benefits, risk, and side effects of benzodiazepines to include potential risk of tolerance and dependence, as well as possible drowsiness.  Advised patient not to drive if experiencing drowsiness and to take lowest possible effective dose to minimize risk of dependence and tolerance.  Monitor for any sign of rash. Please taking Lamictal and contact office immediately rash develops. Recommend seeking urgent medical attention if rash  is severe and/or spreading quickly.  Reviewed PDMP     Joan Flores, NP

## 2023-09-21 ENCOUNTER — Ambulatory Visit (INDEPENDENT_AMBULATORY_CARE_PROVIDER_SITE_OTHER): Payer: PPO | Admitting: Family Medicine

## 2023-09-21 ENCOUNTER — Telehealth: Payer: Self-pay | Admitting: Family Medicine

## 2023-09-21 VITALS — BP 116/68 | HR 72 | Temp 98.1°F | Ht 67.0 in | Wt 168.2 lb

## 2023-09-21 DIAGNOSIS — J029 Acute pharyngitis, unspecified: Secondary | ICD-10-CM | POA: Diagnosis not present

## 2023-09-21 DIAGNOSIS — J988 Other specified respiratory disorders: Secondary | ICD-10-CM

## 2023-09-21 DIAGNOSIS — B9789 Other viral agents as the cause of diseases classified elsewhere: Secondary | ICD-10-CM

## 2023-09-21 DIAGNOSIS — R059 Cough, unspecified: Secondary | ICD-10-CM

## 2023-09-21 LAB — POC COVID19 BINAXNOW: SARS Coronavirus 2 Ag: NEGATIVE

## 2023-09-21 LAB — POCT INFLUENZA A/B
Influenza A, POC: NEGATIVE
Influenza B, POC: NEGATIVE

## 2023-09-21 MED ORDER — PROMETHAZINE-DM 6.25-15 MG/5ML PO SYRP: 5.0000 mL | ORAL_SOLUTION | Freq: Four times a day (QID) | ORAL | 0 refills | Status: DC | PRN

## 2023-09-21 NOTE — Patient Instructions (Addendum)
-  Negative for covid and influenza.  -Symptoms correlate with a viral respiratory illness. Provided information about viral respiratory illness.  -Recommend to rest, hydrate.  -Alternate Tylenol 1000mg  and Ibuprofen 600-800mg  every 4 hours for pain, headache, and body aches. Recommend to eat something when taking Ibuprofen.   -Prescribed Promethazine-DM, take 5mL, every 6 hours as needed for cough. Caution: this medication can cause drowsiness.  -Follow up if not improved.

## 2023-09-21 NOTE — Progress Notes (Signed)
 Acute Office Visit   Subjective:  Patient ID: Paul Ray, male    DOB: [birth year], 68 y.o.   MRN: [id number]  Chief Complaint  Patient presents with   Sore Throat   Cough    Pt reports sx of sore throat, cough, lack of energy. Denied fever, body ache, headache has been going on 2-3 days. Started on Friday. Pt has been coughing up phlegm for years. The recent cough was dry.     HPI:  Patient is here for an acute visit. He has been experiencing a sore throat, varies between productive and non productive cough, nasal congestion, nasal drainage, and fatigue. Symptoms started on Friday.   Denies any fever, body aches, headaches, ear pain, chest pain, or shortness of breath.   Symptoms have improved today.   He denies taking any medication for his symptoms.  Review of Systems  Respiratory:  Positive for cough.    See HPI above      Objective:   BP 116/68 (BP Location: Right Arm, Patient Position: Sitting, Cuff Size: Large)   Pulse 72   Temp 98.1 F (36.7 C) (Oral)   Ht 5\' 7"  (1.702 m)   Wt 168 lb 3.2 oz (76.3 kg)   SpO2 97%   BMI 26.34 kg/m    Physical Exam Vitals reviewed.  Constitutional:      General: He is not in acute distress.    Appearance: Normal appearance. He is ill-appearing. He is not toxic-appearing or diaphoretic.  HENT:     Head: Normocephalic and atraumatic.     Right Ear: Tympanic membrane, ear canal and external ear normal. There is no impacted cerumen.     Left Ear: Tympanic membrane, ear canal and external ear normal. There is no impacted cerumen.     Ears:     Comments: Mild cerumen in left canal and a scab at 9 o'clock.  Eyes:     General:        Right eye: No discharge.        Left eye: No discharge.     Conjunctiva/sclera: Conjunctivae normal.  Cardiovascular:     Rate and Rhythm: Normal rate and regular rhythm.     Heart sounds: Normal heart sounds. No murmur heard.    No friction rub. No gallop.  Pulmonary:     Effort: Pulmonary effort  is normal. No respiratory distress.     Breath sounds: Normal breath sounds.  Musculoskeletal:        General: Normal range of motion.  Skin:    General: Skin is warm and dry.  Neurological:     General: No focal deficit present.     Mental Status: He is alert and oriented to person, place, and time. Mental status is at baseline.  Psychiatric:        Mood and Affect: Mood normal.        Behavior: Behavior normal.        Thought Content: Thought content normal.        Judgment: Judgment normal.     Latest Reference Range & Units 09/21/23 13:45  Influenza A, POC Negative  Negative  Influenza B, POC Negative  Negative      Assessment & Plan:  Viral respiratory illness  Sore throat  Cough, unspecified type -     POC COVID-19 BinaxNow -     POCT Influenza A/B -     Promethazine-DM; Take 5 mLs by mouth 4 (four) times daily as needed.  Dispense: 118 mL; Refill: 0  -Negative for covid and influenza.  -Symptoms correlate with a viral respiratory illness. Provided information about viral respiratory illness.  -Recommend to rest, hydrate.  -Alternate Tylenol 1000mg  and Ibuprofen 600-800mg  every 4 hours for pain, headache, and body aches. Recommend to eat something when taking Ibuprofen.   -Prescribed Promethazine-DM, take 5mL, every 6 hours as needed for cough. Caution: this medication can cause drowsiness.  -Follow up if not improved.   Zandra Abts, NP

## 2023-09-21 NOTE — Telephone Encounter (Signed)
 Pt was called  and he is aware of Dr Oswaldo Done and will back if chooses Dr Oswaldo Done after March 10.

## 2023-09-29 ENCOUNTER — Other Ambulatory Visit: Payer: Self-pay | Admitting: Behavioral Health

## 2023-09-29 DIAGNOSIS — F411 Generalized anxiety disorder: Secondary | ICD-10-CM

## 2023-09-29 DIAGNOSIS — F3160 Bipolar disorder, current episode mixed, unspecified: Secondary | ICD-10-CM

## 2023-10-08 ENCOUNTER — Telehealth: Payer: Self-pay | Admitting: Family Medicine

## 2023-10-08 NOTE — Telephone Encounter (Signed)
 Copied from CRM [id number]. Topic: Appointments - Transfer of Care >> Oct 08, 2023  9:40 AM Almira Coaster wrote: Pt is requesting to transfer FROM: Zandra Abts Pt is requesting to transfer TO: Darel Hong Reason for requested transfer: Would like to stay within the [facility] [place] [facility] It is the responsibility of the team the patient would like to transfer to (Dr. Darel Hong) to reach out to the patient if for any reason this transfer is not acceptable.

## 2023-10-08 NOTE — Telephone Encounter (Signed)
 Pt is scheduled with Dr. Oswaldo Done on 5/22

## 2023-10-08 NOTE — Telephone Encounter (Signed)
 Please call patient to schedule appointment.

## 2023-10-09 ENCOUNTER — Telehealth: Payer: Self-pay | Admitting: Behavioral Health

## 2023-10-09 ENCOUNTER — Other Ambulatory Visit: Payer: Self-pay

## 2023-10-09 DIAGNOSIS — F411 Generalized anxiety disorder: Secondary | ICD-10-CM

## 2023-10-09 DIAGNOSIS — F3160 Bipolar disorder, current episode mixed, unspecified: Secondary | ICD-10-CM

## 2023-10-09 DIAGNOSIS — F331 Major depressive disorder, recurrent, moderate: Secondary | ICD-10-CM

## 2023-10-09 DIAGNOSIS — F902 Attention-deficit hyperactivity disorder, combined type: Secondary | ICD-10-CM

## 2023-10-09 NOTE — Telephone Encounter (Signed)
 Pended RF for Adderall XR 30 and Adderall IR 20 to [facility]. Pended 3 refills for each dose.

## 2023-10-09 NOTE — Telephone Encounter (Signed)
 Pt called at 4:53p requesting refill of Adderall XR 30mg  and Adderall 20mg  to   [facility] [id number] [place], [place] - [address] [facility] [address] [place] [address] Phone: [phone]  Fax: [phone]   Next appt 8/15

## 2023-10-12 ENCOUNTER — Other Ambulatory Visit: Payer: Self-pay

## 2023-10-12 ENCOUNTER — Telehealth: Payer: Self-pay | Admitting: Behavioral Health

## 2023-10-12 DIAGNOSIS — G479 Sleep disorder, unspecified: Secondary | ICD-10-CM

## 2023-10-12 MED ORDER — ALPRAZOLAM 1 MG PO TABS: 1.0000 mg | ORAL_TABLET | Freq: Three times a day (TID) | ORAL | 4 refills | Status: DC | PRN

## 2023-10-12 MED ORDER — AMPHETAMINE-DEXTROAMPHET ER 30 MG PO CP24: ORAL_CAPSULE | ORAL | 0 refills | Status: DC

## 2023-10-12 MED ORDER — AMPHETAMINE-DEXTROAMPHETAMINE 20 MG PO TABS: 20.0000 mg | ORAL_TABLET | Freq: Every day | ORAL | 0 refills | Status: DC

## 2023-10-12 NOTE — Telephone Encounter (Signed)
 Paul Ray called at 1:15 to request refill of his Xanax.  Appt 8/15.  Send to  [facility] [id number] - [place], [place] - [address]

## 2023-10-12 NOTE — Telephone Encounter (Signed)
 Pended alprazolam to HT

## 2023-11-03 ENCOUNTER — Other Ambulatory Visit

## 2023-11-06 ENCOUNTER — Telehealth: Payer: Self-pay | Admitting: Behavioral Health

## 2023-11-06 NOTE — Telephone Encounter (Signed)
error 

## 2023-11-09 ENCOUNTER — Ambulatory Visit
Admission: RE | Admit: 2023-11-09 | Discharge: 2023-11-09 | Disposition: A | Discharge status: Home / Self Care | Source: Ambulatory Visit | Attending: Internal Medicine

## 2023-11-09 ENCOUNTER — Encounter: Payer: Self-pay | Admitting: Radiology

## 2023-11-09 DIAGNOSIS — I7121 Aneurysm of the ascending aorta, without rupture: Secondary | ICD-10-CM

## 2023-11-09 MED ORDER — IOPAMIDOL (ISOVUE-370) INJECTION 76%
75.0000 mL | Freq: Once | INTRAVENOUS | Status: AC | PRN
  Administered 2023-11-09: 75 mL via INTRAVENOUS

## 2023-11-14 ENCOUNTER — Encounter: Payer: Self-pay | Admitting: Internal Medicine

## 2023-11-23 ENCOUNTER — Encounter: Payer: Self-pay | Admitting: Orthopaedic Surgery

## 2023-11-23 ENCOUNTER — Other Ambulatory Visit (INDEPENDENT_AMBULATORY_CARE_PROVIDER_SITE_OTHER): Payer: Self-pay

## 2023-11-23 ENCOUNTER — Ambulatory Visit: Admitting: Orthopaedic Surgery

## 2023-11-23 DIAGNOSIS — M25512 Pain in left shoulder: Secondary | ICD-10-CM

## 2023-11-23 DIAGNOSIS — G8929 Other chronic pain: Secondary | ICD-10-CM

## 2023-11-23 NOTE — Progress Notes (Signed)
 The patient came in today because last week his left shoulder was bothering him quite a bit.  He has never had imaging of the left shoulder but the right shoulder has known bone-on-bone arthritis and at some point he was considering a shoulder replacement with Dr. Rozelle Corning.  He has held off on that.  He still stays very active.  I did place a steroid injection in February in the right shoulder subacromial outlet and is done well.  The right shoulder started to bother him quite a bit again.  He says his left shoulder is actually not hurting him today.  Left shoulder is moving smoothly and fluidly with just some mild pain.  The right shoulder still moves smoothly but does have significant glenohumeral pain.  X-rays left shoulder does show significant glenohumeral arthritis but the humeral head is not high riding.  At this point I would like him to have a follow-up appoint with Dr. Rozelle Corning to consider an ultrasound-guided injection in the right shoulder glenohumeral joint and potentially in the left shoulder glenohumeral joint as well.  He said he can make that appointment

## 2023-11-25 ENCOUNTER — Other Ambulatory Visit: Payer: Self-pay | Admitting: *Deleted

## 2023-11-25 DIAGNOSIS — Q2381 Bicuspid aortic valve: Secondary | ICD-10-CM

## 2023-11-25 DIAGNOSIS — I7121 Aneurysm of the ascending aorta, without rupture: Secondary | ICD-10-CM

## 2023-11-25 NOTE — Progress Notes (Signed)
 Orders placed for repeat Ct angio chest aorta in 6 months and TCTS consult.

## 2023-12-07 ENCOUNTER — Ambulatory Visit: Admitting: Orthopedic Surgery

## 2023-12-07 ENCOUNTER — Other Ambulatory Visit: Payer: Self-pay

## 2023-12-07 ENCOUNTER — Encounter: Payer: Self-pay | Admitting: Orthopedic Surgery

## 2023-12-07 DIAGNOSIS — M19012 Primary osteoarthritis, left shoulder: Secondary | ICD-10-CM

## 2023-12-07 DIAGNOSIS — M19011 Primary osteoarthritis, right shoulder: Secondary | ICD-10-CM

## 2023-12-07 NOTE — Progress Notes (Unsigned)
 Office Visit Note   Patient: Paul Ray           Date of Birth: 1956-02-11           MRN: [id number] Visit Date: 12/07/2023 Requested by: Francenia Ingle, NP [id number],  [place] [address] PCP: Francenia Ingle, NP  Subjective: Chief Complaint  Patient presents with   Right Shoulder - Pain   Left Shoulder - Pain    HPI: Paul Ray is a 68 y.o. male who presents to the office reporting bilateral shoulder pain with known history of bilateral shoulder arthritis right worse than left.  Did have a subacromial injection in February in the right shoulder with Dr. Lucienne Ryder which gave him good relief.  Sent here now for further evaluation and management.  Was noted to have early arthritis in that left shoulder not as advanced as the right shoulder..                ROS: All systems reviewed are negative as they relate to the chief complaint within the history of present illness.  Patient denies fevers or chills.  Assessment & Plan: Visit Diagnoses: No diagnosis found.  Plan: Impression is bilateral shoulder arthritis right worse than left.  We talked about subacromial versus glenohumeral joint injections.  We will try an ultrasound-guided injection into his right shoulder today.  If that helps him significantly we can repeat that in the left shoulder.  I think in general his rotator cuff is fairly functional in both shoulders.  He is getting a little bit of loss of motion on the right-hand side but considering how the radiographs look his shoulder function is actually more than expected.  Follow-Up Instructions: No follow-ups on file.   Orders:  No orders of the defined types were placed in this encounter.  No orders of the defined types were placed in this encounter.     Procedures: Large Joint Inj: R glenohumeral on 12/07/2023 6:21 AM Indications: diagnostic evaluation and pain Details: 22 G 3.5 in needle, ultrasound-guided posterior approach  Arthrogram:  No  Medications: 9 mL bupivacaine  0.5 %; 5 mL lidocaine  1 % Outcome: tolerated well, no immediate complications Procedure, treatment alternatives, risks and benefits explained, specific risks discussed. Consent was given by the patient. Immediately prior to procedure a time out was called to verify the correct patient, procedure, equipment, support staff and site/side marked as required. Patient was prepped and draped in the usual sterile fashion.     Kenalog  injected  Clinical Data: No additional findings.  Objective: Vital Signs: There were no vitals taken for this visit.  Physical Exam:  Constitutional: Patient appears well-developed HEENT:  Head: Normocephalic Eyes:EOM are normal Neck: Normal range of motion Cardiovascular: Normal rate Pulmonary/chest: Effort normal Neurologic: Patient is alert Skin: Skin is warm Psychiatric: Patient has normal mood and affect  Ortho Exam: Ortho exam demonstrates range of motion on the right of 45/95/150 left range of motion is 50/100/165.  Prominent AC joint on the right not the left.  Rotator cuff strength is pretty reasonable to infraspinatus supraspinatus and subscap muscle testing.  Deltoid is functional.  Does have decreased neck extension but flexion is intact along with rotation.  No paresthesias C5-T1.  No masses lymphadenopathy or skin changes noted in the shoulder girdle region.  Specialty Comments:  MRI LUMBAR SPINE WITHOUT CONTRAST   TECHNIQUE: Multiplanar, multisequence MR imaging of the lumbar spine was performed. No intravenous contrast was administered.  COMPARISON:  Lumbar spine MRI 07/14/2021.   FINDINGS: Segmentation: Conventional numbering is assumed with 5 non-rib-bearing, lumbar type vertebral bodies.   Alignment: 3 mm anterolisthesis of L4 on L5. Trace retrolisthesis of L1 on L2.   Vertebrae: Modic type 1 degenerative endplate marrow signal changes at L1-2.   Conus medullaris and cauda equina: Conus  extends to the L1 level. Conus and cauda equina appear normal.   Paraspinal and other soft tissues: Unremarkable.   Disc levels:   Congenitally short pedicles throughout the lumbar spine.   T12-L1: Small disc bulge without spinal canal stenosis or neural foraminal narrowing.   L1-L2: Worsening disc bulge with new superimposed right central disc extrusion caudal migration measuring a proximally 14 mm (sagittal image 7 series 3), results in severe spinal canal stenosis with superimposed compression of the traversing nerve roots in the right lateral recess. Facet arthropathy contributes to mild bilateral neural foraminal narrowing, unchanged.   L2-L3: Slightly increased disc bulge and facet arthropathy results in severe spinal canal stenosis, worse from prior, and mild bilateral neural foraminal narrowing, unchanged.   L3-L4: Prior right hemilaminotomy. Interval resolution of previously seen central disc extrusion. Residual disc bulge contributes to moderate spinal canal stenosis, improved from prior. Bilateral facet arthropathy contributes to mild bilateral neural foraminal narrowing, unchanged.   L4-L5: Disc bulge and facet arthropathy results in mild spinal canal stenosis and mild bilateral neural foraminal narrowing, unchanged.   L5-S1: Small disc bulge without spinal canal stenosis. Bilateral facet arthropathy results in moderate left and mild right neural foraminal narrowing, unchanged.   IMPRESSION: 1. Worsening degenerative disc disease at L1-L2 with a new right central disc extrusion and caudal migration, resulting in severe spinal canal stenosis with superimposed compression of the traversing nerve roots in the right lateral recess. 2. Slightly increased disc bulge at L2-L3 results in severe spinal canal stenosis, worse from prior. 3. Prior right hemilaminotomy at L3-L4. Interval resolution of previously seen central disc extrusion. Residual disc bulge contributes  to moderate spinal canal stenosis, improved from prior.     Electronically Signed   By: Audra Blend M.D.   On: 10/01/2022 14:24  Imaging: No results found.   PMFS History: Patient Active Problem List   Diagnosis Date Noted   Protrusion of lumbar intervertebral disc 09/23/2022   History of lumbar laminectomy for spinal cord decompression 01/08/2022   Spinal stenosis of lumbar region 07/25/2021   Foraminal stenosis of cervical region 03/08/2021   Viral pericarditis    Alcohol use disorder, severe, dependence (HCC) 12/06/2020   Chest pain 11/09/2020   Ascending aortic aneurysm (HCC)    Benign essential HTN    Coronary artery calcification seen on CAT scan    Psychoactive substance-induced psychosis (HCC)    Rib pain 07/01/2017   Closed fracture of one rib of left side 07/01/2017   Chronic right shoulder pain 05/04/2017   Osteoarthritis of right hip 12/02/2016   Lumbar radiculopathy 04/17/2014   Chronic low back pain 08/16/2013   Lumbar post-laminectomy syndrome 08/16/2013   Lumbosacral radiculitis 08/16/2013   Impingement syndrome of right shoulder 07/05/2013   Empyema lung (HCC)    Abscess of lung (HCC)    Bipolar depression (HCC)    ADD (attention deficit disorder)    Hepatitis C    Hip pain 05/02/2011   History of total hip arthroplasty 04/23/2011   Lung mass 10/22/2010   Pleural effusion 10/15/2010   Past Medical History:  Diagnosis Date   Abscess of lung(513.0)  Right lower lobe   ADD (attention deficit disorder)    Anxiety    Arthritis    Ascending aortic aneurysm (HCC)    47mm by Chest CTA 05/2020 and 48mm by echo 05/2020   Benign essential HTN    Bipolar depression (HCC)    Coronary artery calcification seen on CAT scan    Empyema lung (HCC)    Hemorrhoids    Hepatitis C    Pneumonia    last year   Prostate abscess    Staph infection    Viral pericarditis    after COVID 19 infection    Family History  Problem Relation Age of Onset   COPD  Mother    Heart disease Mother    Hypertension Mother    Coronary artery disease Father    Dementia Father    Heart failure Father    Schizophrenia Brother    Breast cancer Maternal Grandmother    Prostate cancer Paternal Grandfather        also had bone cancer   Cancer Paternal Grandmother        unsure what kind    Past Surgical History:  Procedure Laterality Date   ELBOW SURGERY  2005   Right   FOOT NEUROMA SURGERY  2006   Left   HIP RESURFACING     left hip at [facility] 01/2011   LUMBAR LAMINECTOMY/DECOMPRESSION MICRODISCECTOMY  07/24/2011   Procedure: LUMBAR LAMINECTOMY/DECOMPRESSION MICRODISCECTOMY;  Surgeon: Isadora Mar;  Location: [facility];  Service: Neurosurgery;  Laterality: Bilateral;  Bilateral  , Lumbar Three-Four, Lumbar Four-Five Decompressive Laminectomy Rm # 32   LUNG SURGERY     for empyema   SHOULDER ARTHROSCOPY Right 07/05/2013   Procedure: RIGHT SHOULDER ARTHROSCOPY WITH DEBRIDEMENT;  Surgeon: Arnie Lao, MD;  Location: [facility];  Service: Orthopedics;  Laterality: Right;   TENDON TRANSFER Left 01/26/2014   Procedure: LEFT THUMB TRAPEZIECTOMY KNOTTED TENDON INTRAPOSITION TRANSFER OF ABDUCTOR POLLICIS LONGUS TO THENARS;  Surgeon: Amelie Baize, MD;  Location: [facility];  Service: Orthopedics;  Laterality: Left;   THORACOTOMY  November 14 2010   rt   Social History   Occupational History   Occupation: Music therapist  Tobacco Use   Smoking status: Never   Smokeless tobacco: Never  Vaping Use   Vaping status: Never Used  Substance and Sexual Activity   Alcohol use: No    Comment: quit drinking in 1989   Drug use: Not Currently    Types: Hydrocodone     Comment: Prior heavy use of opioid pain medications up until 2 years ago   Sexual activity: Yes

## 2023-12-08 DIAGNOSIS — B353 Tinea pedis: Secondary | ICD-10-CM | POA: Diagnosis not present

## 2023-12-08 DIAGNOSIS — L821 Other seborrheic keratosis: Secondary | ICD-10-CM | POA: Diagnosis not present

## 2023-12-10 MED ORDER — LIDOCAINE HCL 1 % IJ SOLN
5.0000 mL | INTRAMUSCULAR | Status: AC | PRN
  Administered 2023-12-07: 5 mL

## 2023-12-10 MED ORDER — BUPIVACAINE HCL 0.5 % IJ SOLN
9.0000 mL | INTRAMUSCULAR | Status: AC | PRN
  Administered 2023-12-07: 9 mL via INTRA_ARTICULAR

## 2023-12-17 ENCOUNTER — Encounter: Payer: PPO | Admitting: Family Medicine

## 2023-12-17 ENCOUNTER — Ambulatory Visit (INDEPENDENT_AMBULATORY_CARE_PROVIDER_SITE_OTHER): Admitting: Student in an Organized Health Care Education/Training Program

## 2023-12-17 ENCOUNTER — Encounter: Payer: Self-pay | Admitting: Student in an Organized Health Care Education/Training Program

## 2023-12-17 VITALS — BP 137/75 | HR 66 | Ht 67.4 in | Wt 167.0 lb

## 2023-12-17 DIAGNOSIS — I7121 Aneurysm of the ascending aorta, without rupture: Secondary | ICD-10-CM

## 2023-12-17 DIAGNOSIS — I10 Essential (primary) hypertension: Secondary | ICD-10-CM | POA: Diagnosis not present

## 2023-12-17 DIAGNOSIS — H6122 Impacted cerumen, left ear: Secondary | ICD-10-CM

## 2023-12-17 DIAGNOSIS — H9113 Presbycusis, bilateral: Secondary | ICD-10-CM

## 2023-12-17 DIAGNOSIS — H9110 Presbycusis, unspecified ear: Secondary | ICD-10-CM | POA: Insufficient documentation

## 2023-12-17 DIAGNOSIS — Z1159 Encounter for screening for other viral diseases: Secondary | ICD-10-CM | POA: Diagnosis not present

## 2023-12-17 DIAGNOSIS — R41840 Attention and concentration deficit: Secondary | ICD-10-CM

## 2023-12-17 DIAGNOSIS — F319 Bipolar disorder, unspecified: Secondary | ICD-10-CM

## 2023-12-17 DIAGNOSIS — B353 Tinea pedis: Secondary | ICD-10-CM | POA: Insufficient documentation

## 2023-12-17 DIAGNOSIS — H6120 Impacted cerumen, unspecified ear: Secondary | ICD-10-CM | POA: Insufficient documentation

## 2023-12-17 LAB — COMPREHENSIVE METABOLIC PANEL WITH GFR
ALT: 9 U/L (ref 0–53)
AST: 21 U/L (ref 0–37)
Albumin: 4.5 g/dL (ref 3.5–5.2)
Alkaline Phosphatase: 61 U/L (ref 39–117)
BUN: 25 mg/dL — ABNORMAL HIGH (ref 6–23)
CO2: 32 meq/L (ref 19–32)
Calcium: 9.2 mg/dL (ref 8.4–10.5)
Chloride: 101 meq/L (ref 96–112)
Creatinine, Ser: 0.93 mg/dL (ref 0.40–1.50)
GFR: 85.27 mL/min (ref >60.00)
Glucose, Bld: 90 mg/dL (ref 70–99)
Potassium: 4.5 meq/L (ref 3.5–5.1)
Sodium: 137 meq/L (ref 135–145)
Total Bilirubin: 0.9 mg/dL (ref 0.2–1.2)
Total Protein: 7.1 g/dL (ref 6.0–8.3)

## 2023-12-17 LAB — LIPID PANEL
Cholesterol: 155 mg/dL (ref 0–200)
HDL: 48.7 mg/dL (ref >39.00)
LDL Cholesterol: 93 mg/dL (ref 0–99)
NonHDL: 106.07
Total CHOL/HDL Ratio: 3
Triglycerides: 65 mg/dL (ref 0.0–149.0)
VLDL: 13 mg/dL (ref 0.0–40.0)

## 2023-12-17 LAB — HEMOGLOBIN A1C: Hgb A1c MFr Bld: 5.5 % (ref 4.6–6.5)

## 2023-12-17 MED ORDER — TERBINAFINE HCL 250 MG PO TABS: 250.0000 mg | ORAL_TABLET | Freq: Every day | ORAL | 2 refills | Status: DC

## 2023-12-17 NOTE — Assessment & Plan Note (Signed)
 Managed at [facility] psychiatry.  Currently using long-acting Adderall  30 mg in the morning and short acting Adderall  20 mg in the afternoon.  Patient reports good benefits, denies side effects.  He has no active ischemic vascular disease or other contraindications.

## 2023-12-17 NOTE — Assessment & Plan Note (Signed)
 Chronic and stable.  Patient with a 4.8 cm thoracic aortic aneurysm identified over 10 years ago and has been monitored annually with imaging.  He has consultation with Dr. Ames Bakes upcoming.  He has mild hypertension today with BP of 137/75.  He has been on antihypertensives in the past, but not currently.  I offered antihypertensive medication, but he wants to hold off for now.

## 2023-12-17 NOTE — Assessment & Plan Note (Signed)
 chronic issue, gradual moderate hearing loss over many years most consistent with age-related presbycusis.  Exam of the ears is normal, I did remove impacted wax from the left side.  Will refer to ENT for audiology and consideration of hearing aids.

## 2023-12-17 NOTE — Assessment & Plan Note (Signed)
 Acute problem of onychomycosis along with tinea pedis causing skin breakdown in between the lateral toes in both feet.  We talked about treatment options including topicals.  Because of his extensive fungal burden, I recommended therapy with terbinafine .  Will check liver enzymes today, no history of chronic liver disease.  Treatment will be for 12 weeks.

## 2023-12-17 NOTE — Progress Notes (Signed)
 New Patient Office Visit  Subjective    Patient ID: Paul Ray, male    DOB: 1956/03/05  Age: 68 y.o. MRN: [id number]  CC:   Chief Complaint  Patient presents with   Establish Care    Would like a complete physical with blood draw     HPI  Paul Ray presents to establish care  68 year old person here for management of hypertension.  Patient is a retired Music therapist, retired about 5 years ago.  He has a significant medical history of bipolar disease and ADHD which is being managed with a psychiatrist at [facility] clinic.  He lives here in [place], he is divorced, he has 2 daughters that are in college.  He has been retired about 5 years, he enjoys fishing and exercise at the [employer].  He has been limited over the years by terrible diffuse osteoarthritis which has been managed with a number of surgeries with orthopedics.  In 2012 he had severe right-sided empyema that was managed with VATS.  He reports excellent exertional capacity now.  No history of ischemic heart disease.  No chest pain or dyspnea with exertion on exercise.  He reports that his mood is stable, sometimes feels isolated and a little depressed, but no recent changes to his medications and no recent hospitalizations.  He denies the use of any tobacco or alcohol or other drugs.  He is totally functional, and independent in all activities of daily living.    Outpatient Encounter Medications as of 12/17/2023  Medication Sig   ALPRAZolam  (XANAX ) 1 MG tablet Take 1 tablet (1 mg total) by mouth 3 (three) times daily as needed for anxiety.   amphetamine -dextroamphetamine  (ADDERALL  XR) 30 MG 24 hr capsule Take 1 capsule (30 mg) by mouth every morning.   amphetamine -dextroamphetamine  (ADDERALL ) 20 MG tablet Take 1 tablet (20 mg total) by mouth daily.   cariprazine  (VRAYLAR ) 1.5 MG capsule TAKE 1 CAPSULE BY MOUTH DAILY   lamoTRIgine  (LAMICTAL ) 200 MG tablet Take 1 tablet (200 mg total) by mouth daily.   silodosin (RAPAFLO)  8 MG CAPS capsule Take 8 mg by mouth daily.   terbinafine (LAMISIL) 250 MG tablet Take 1 tablet (250 mg total) by mouth daily.   [DISCONTINUED] aspirin  EC 81 MG tablet Take 1 tablet (81 mg total) by mouth daily. Swallow whole.   [DISCONTINUED] amphetamine -dextroamphetamine  (ADDERALL  XR) 30 MG 24 hr capsule Take 1 capsule (30 mg) by mouth every morning.   [DISCONTINUED] amphetamine -dextroamphetamine  (ADDERALL  XR) 30 MG 24 hr capsule Take 1 capsule (30 mg) by mouth every morning.   [DISCONTINUED] amphetamine -dextroamphetamine  (ADDERALL ) 20 MG tablet Take 1 tablet (20 mg total) by mouth daily.   [DISCONTINUED] amphetamine -dextroamphetamine  (ADDERALL ) 20 MG tablet Take 1 tablet (20 mg total) by mouth daily.   [DISCONTINUED] diazepam  (VALIUM ) 5 MG tablet Take one tablet by mouth with food one hour prior to procedure. May repeat 30 minutes prior if needed. (Patient not taking: Reported on 09/21/2023)   [DISCONTINUED] promethazine -dextromethorphan (PROMETHAZINE -DM) 6.25-15 MG/5ML syrup Take 5 mLs by mouth 4 (four) times daily as needed. (Patient not taking: Reported on 12/17/2023)   No facility-administered encounter medications on file as of 12/17/2023.    Past Medical History:  Diagnosis Date   Abscess of lung [facility])    Right lower lobe  IMO SNOMED Dx Update Oct 2024     Abscess of lung(513.0)    Right lower lobe   ADD (attention deficit disorder)    Anxiety    Arthritis  Ascending aortic aneurysm (HCC)    47mm by Chest CTA 05/2020 and 48mm by echo 05/2020   Benign essential HTN    Bipolar depression (HCC)    Coronary artery calcification seen on CAT scan    Empyema lung (HCC)    Hemorrhoids    Hepatitis C    History of lumbar laminectomy for spinal cord decompression 01/08/2022   History of total hip arthroplasty 04/23/2011   Pneumonia    last year   Prostate abscess    Staph infection    Viral pericarditis    after COVID 19 infection    Past Surgical History:  Procedure Laterality  Date   ELBOW SURGERY  2005   Right   FOOT NEUROMA SURGERY  2006   Left   HIP RESURFACING     left hip [facility] 01/2011   LUMBAR LAMINECTOMY/DECOMPRESSION MICRODISCECTOMY  07/24/2011   Procedure: LUMBAR LAMINECTOMY/DECOMPRESSION MICRODISCECTOMY;  Surgeon: Isadora Mar;  Location: [facility];  Service: Neurosurgery;  Laterality: Bilateral;  Bilateral  , Lumbar Three-Four, Lumbar Four-Five Decompressive Laminectomy Rm # 32   LUNG SURGERY     for empyema   SHOULDER ARTHROSCOPY Right 07/05/2013   Procedure: RIGHT SHOULDER ARTHROSCOPY WITH DEBRIDEMENT;  Surgeon: Arnie Lao, MD;  Location: [facility] OR;  Service: Orthopedics;  Laterality: Right;   TENDON TRANSFER Left 01/26/2014   Procedure: LEFT THUMB TRAPEZIECTOMY KNOTTED TENDON INTRAPOSITION TRANSFER OF ABDUCTOR POLLICIS LONGUS TO THENARS;  Surgeon: Amelie Baize, MD;  Location: [facility];  Service: Orthopedics;  Laterality: Left;   THORACOTOMY  November 14 2010   rt    Family History  Problem Relation Age of Onset   COPD Mother    Heart disease Mother    Hypertension Mother    Coronary artery disease Father    Dementia Father    Heart failure Father    Schizophrenia Brother    Breast cancer Maternal Grandmother    Prostate cancer Paternal Grandfather        also had bone cancer   Cancer Paternal Grandmother        unsure what kind       Objective    BP 137/75   Pulse 66   Ht 5' 7.4" (1.712 m)   Wt 167 lb (75.8 kg)   SpO2 100%   BMI 25.85 kg/m   Physical Exam  Gen: Well-appearing man Eyes: Mildly constricted pupils, otherwise normal Ears: Right ear has normal tympanic membrane, left ear impacted with wax This was removed. tympanic membrane on the left is normal.   Neck: Normal thyroid , no nodules or adenopathy Heart: Regular, no murmur Lungs: Unlabored, clear throughout Abd: Normal, no organomegaly Skin: Heavy sun exposure on the trunk, no rashes or pigmented lesions Ext: warm, no edema,  diffuse osteoarthritis in both hands, separated AC joint is chronic on the right side but good range of motion Neuro: Alert, conversational, full strength upper and lower extremities, normal gait, no intention tremor, very slight unintentional movements of his lower lip Psych: Appropriate mood and affect, not anxious or depressed appearing, organized speech, linear thoughts, not responding to internal stimuli     Assessment & Plan:   Problem List Items Addressed This Visit       High   Bipolar depression (HCC) (Chronic)   Chronic and stable.  Managed at [facility] psychiatry.  Currently using lamotrigine , Vraylar , and Xanax  1 mg 3 times daily.  No recent changes in his medication regimen.  Seems  to be effective for him.  No side effects.  Weight is normal.  He is having intermittent intention tremors, and I noticed some tremor at his lips during our exam.  Could possibly be related to extraparametal symptoms from the Vraylar , but it is at a very low dose.  Will monitor this.  Will check labs today to look for metabolic syndrome that can be associated with antipsychotic medications.      Relevant Orders   Comprehensive metabolic panel with GFR   Lipid panel   Hemoglobin A1c   ADD (attention deficit disorder) (Chronic)   Managed at [facility] psychiatry.  Currently using long-acting Adderall  30 mg in the morning and short acting Adderall  20 mg in the afternoon.  Patient reports good benefits, denies side effects.  He has no active ischemic vascular disease or other contraindications.        Medium    Ascending aortic aneurysm (HCC) (Chronic)   Chronic and stable.  Patient with a 4.8 cm thoracic aortic aneurysm identified over 10 years ago and has been monitored annually with imaging.  He has consultation with Dr. Ames Bakes upcoming.  He has mild hypertension today with BP of 137/75.  He has been on antihypertensives in the past, but not currently.  I offered antihypertensive medication, but he  wants to hold off for now.      Benign essential HTN (Chronic)   Chronic, asymptomatic stage I hypertension.  Blood pressure today 137/75.  He has no established ischemic vascular disease but does have a thoracic aortic aneurysm at 4.8 cm.  For him I recommended antihypertensive for goal systolic pressure less than 130.  He has used some antihypertensives in the past, but really wants to keep his pill burden low.  So he declined new medications for now.  Will talk about this again with him in 3 months.        Unprioritized   Tinea pedis - Primary   Acute problem of onychomycosis along with tinea pedis causing skin breakdown in between the lateral toes in both feet.  We talked about treatment options including topicals.  Because of his extensive fungal burden, I recommended therapy with terbinafine.  Will check liver enzymes today, no history of chronic liver disease.  Treatment will be for 12 weeks.      Relevant Medications   terbinafine (LAMISIL) 250 MG tablet   Other Relevant Orders   Comprehensive metabolic panel with GFR   Cerumen impaction   Procedure Note: Manual Removal of Impacted Cerumen Using a Curette   Indication:  Cerumen impaction causing symptoms (e.g., hearing loss, pain, tinnitus) or preventing assessment of the ear canal and tympanic membrane.  Procedure:  Explained the procedure to the patient and informed consent was obtained  Review patient history for contraindications (e.g., nonintact tympanic membrane, history of ear surgery, anatomical abnormalities).  After a position of the patient's head upright, I visualized the ear canal and cerumen using an otoscope.  I gently inserted the curette into the ear canal avoiding contact with the canal walls, and carefully scooped the cerumen, removing it in small pieces.  I reassessed the ear canal and tympanic membrane, there was no residual cerumen nor signs of trauma.  Follow-Up:  I instructed the patient to report any  persistent symptoms such as pain, discharge, or hearing loss.  Schedule a follow-up appointment if necessary.       Presbycusis   chronic issue, gradual moderate hearing loss over many years most consistent with age-related  presbycusis.  Exam of the ears is normal, I did remove impacted wax from the left side.  Will refer to ENT for audiology and consideration of hearing aids.      Relevant Orders   Ambulatory referral to ENT   Other Visit Diagnoses       Encounter for HCV screening test for low risk patient       Relevant Orders   Hepatitis C antibody       Return in about 3 months (around 03/18/2024).   Ether Hercules, MD

## 2023-12-17 NOTE — Assessment & Plan Note (Signed)
 Chronic, asymptomatic stage I hypertension.  Blood pressure today 137/75.  He has no established ischemic vascular disease but does have a thoracic aortic aneurysm at 4.8 cm.  For him I recommended antihypertensive for goal systolic pressure less than 130.  He has used some antihypertensives in the past, but really wants to keep his pill burden low.  So he declined new medications for now.  Will talk about this again with him in 3 months.

## 2023-12-17 NOTE — Assessment & Plan Note (Signed)
 Chronic and stable.  Managed at [facility] psychiatry.  Currently using lamotrigine , Vraylar , and Xanax  1 mg 3 times daily.  No recent changes in his medication regimen.  Seems to be effective for him.  No side effects.  Weight is normal.  He is having intermittent intention tremors, and I noticed some tremor at his lips during our exam.  Could possibly be related to extraparametal symptoms from the Vraylar , but it is at a very low dose.  Will monitor this.  Will check labs today to look for metabolic syndrome that can be associated with antipsychotic medications.

## 2023-12-17 NOTE — Assessment & Plan Note (Signed)
 Procedure Note: Manual Removal of Impacted Cerumen Using a Curette   Indication:  Cerumen impaction causing symptoms (e.g., hearing loss, pain, tinnitus) or preventing assessment of the ear canal and tympanic membrane.  Procedure:  Explained the procedure to the patient and informed consent was obtained  Review patient history for contraindications (e.g., nonintact tympanic membrane, history of ear surgery, anatomical abnormalities).  After a position of the patient's head upright, I visualized the ear canal and cerumen using an otoscope.  I gently inserted the curette into the ear canal avoiding contact with the canal walls, and carefully scooped the cerumen, removing it in small pieces.  I reassessed the ear canal and tympanic membrane, there was no residual cerumen nor signs of trauma.  Follow-Up:   I instructed the patient to report any persistent symptoms such as pain, discharge, or hearing loss.  Schedule a follow-up appointment if necessary.

## 2023-12-18 ENCOUNTER — Ambulatory Visit: Payer: Self-pay | Admitting: Student in an Organized Health Care Education/Training Program

## 2023-12-20 LAB — HCV RNA,QUANTITATIVE REAL TIME PCR
HCV Quantitative Log: <1.18 {Log_IU}/mL
HCV RNA, PCR, QN: <15 [IU]/mL

## 2023-12-20 LAB — HEPATITIS C ANTIBODY: Hepatitis C Ab: REACTIVE — AB

## 2024-01-05 ENCOUNTER — Telehealth: Payer: Self-pay | Admitting: Behavioral Health

## 2024-01-05 ENCOUNTER — Other Ambulatory Visit: Payer: Self-pay

## 2024-01-05 DIAGNOSIS — F331 Major depressive disorder, recurrent, moderate: Secondary | ICD-10-CM

## 2024-01-05 DIAGNOSIS — F902 Attention-deficit hyperactivity disorder, combined type: Secondary | ICD-10-CM

## 2024-01-05 DIAGNOSIS — F411 Generalized anxiety disorder: Secondary | ICD-10-CM

## 2024-01-05 DIAGNOSIS — F3160 Bipolar disorder, current episode mixed, unspecified: Secondary | ICD-10-CM

## 2024-01-05 NOTE — Telephone Encounter (Signed)
 Pt called and lm at 2:26pm. He is requesting Rfs on both doses of Adderall .  Please send in to [facility] on [address]Aaron Aas  Appt 03/11/24

## 2024-01-05 NOTE — Telephone Encounter (Signed)
 Pended both doses of Adderall to HT.

## 2024-01-06 MED ORDER — AMPHETAMINE-DEXTROAMPHET ER 30 MG PO CP24: ORAL_CAPSULE | ORAL | 0 refills | Status: DC

## 2024-01-06 MED ORDER — AMPHETAMINE-DEXTROAMPHETAMINE 20 MG PO TABS: 20.0000 mg | ORAL_TABLET | Freq: Every day | ORAL | 0 refills | Status: DC

## 2024-01-13 ENCOUNTER — Encounter: Admitting: Surgery

## 2024-01-13 ENCOUNTER — Ambulatory Visit: Payer: Self-pay

## 2024-01-13 NOTE — Telephone Encounter (Signed)
Appt with you tomorrow

## 2024-01-13 NOTE — Telephone Encounter (Signed)
 FYI Only or Action Required?: FYI only for provider  Patient was last seen in primary care on 12/17/2023 by Ether Hercules, MD. Called Nurse Triage reporting Foot Swelling. Symptoms began about a month ago. Interventions attempted: OTC medications: aleve. Symptoms are: gradually worsening.  Triage Disposition: See PCP When Office is Open (Within 3 Days)  Patient/caregiver understands and will follow disposition?: Yes               Copied from CRM [id number]. Topic: Clinical - Red Word Triage >> Jan 13, 2024  2:04 PM Albertha Alosa wrote: Red Word that prompted transfer to Nurse Triage: Patient called in stated his feet is swollen and he is in pain Reason for Disposition  [1] MODERATE pain (e.g., interferes with normal activities, limping) AND [2] present > 3 days  Answer Assessment - Initial Assessment Questions 1. ONSET: When did the pain start?      Over a month a ago 2. LOCATION: Where is the pain located?      Right foot 3. PAIN: How bad is the pain?    (Scale 1-10; or mild, moderate, severe)  - MILD (1-3): doesn't interfere with normal activities.   - MODERATE (4-7): interferes with normal activities (e.g., work or school) or awakens from sleep, limping.   - SEVERE (8-10): excruciating pain, unable to do any normal activities, unable to walk.      8/10  4. WORK OR EXERCISE: Has there been any recent work or exercise that involved this part of the body?      no  6. OTHER SYMPTOMS: Do you have any other symptoms? (e.g., leg pain, rash, fever, numbness)     swelling  Protocols used: Foot Pain-A-AH

## 2024-01-14 ENCOUNTER — Ambulatory Visit (INDEPENDENT_AMBULATORY_CARE_PROVIDER_SITE_OTHER): Admitting: Family Medicine

## 2024-01-14 ENCOUNTER — Encounter: Payer: Self-pay | Admitting: Family Medicine

## 2024-01-14 VITALS — BP 116/68 | HR 78 | Temp 98.9°F | Ht 67.4 in | Wt 168.4 lb

## 2024-01-14 DIAGNOSIS — L03119 Cellulitis of unspecified part of limb: Secondary | ICD-10-CM | POA: Diagnosis not present

## 2024-01-14 MED ORDER — CEPHALEXIN 500 MG PO CAPS: 500.0000 mg | ORAL_CAPSULE | Freq: Three times a day (TID) | ORAL | 0 refills | Status: DC

## 2024-01-14 NOTE — Patient Instructions (Signed)
 Follow up as needed or as scheduled START the Cephalexin  3x/day for skin infection CONTINUE the Terbinafine  for the athlete's foot ADD the Clotrimazole cream twice daily to the feet If no improvement in redness/pain/swelling- please let us  know! Call with any questions or concerns Hang in there!!

## 2024-01-14 NOTE — Progress Notes (Unsigned)
   Subjective:    Patient ID: Paul Ray, male    DOB: 08-05-1955, 68 y.o.   MRN: [id number]  HPI Foot pain- pt saw PCP ~1 month ago and was tx'd for athlete's foot.  Started on Terbinafine .  Pt reports 'it's gotten way worse'.  Foot is now red and swollen.  Pt reports having pain at base of 3rd and 4th toes.  Has pain on underside of foot as well.  Has white debris between toes w/ odor.  Pt reports redness started a few weeks ago but has worsened in the last week.   Review of Systems For ROS see HPI     Objective:   Physical Exam        Assessment & Plan:

## 2024-01-27 ENCOUNTER — Encounter: Payer: Self-pay | Admitting: Surgery

## 2024-01-27 ENCOUNTER — Ambulatory Visit: Attending: Surgery | Admitting: Surgery

## 2024-01-27 VITALS — BP 140/76 | HR 78 | Resp 20 | Ht 67.5 in | Wt 168.0 lb

## 2024-01-27 DIAGNOSIS — I7121 Aneurysm of the ascending aorta, without rupture: Secondary | ICD-10-CM | POA: Diagnosis not present

## 2024-01-27 NOTE — Progress Notes (Signed)
 [address], [place] [address]             [phone]     Cardiothoracic Surgery Consultation  PCP is Jerrell Cleatus Ned, MD Referring Provider is Wendel Lurena POUR, MD  Chief Complaint  Patient presents with   Thoracic Aortic Aneurysm    F/u with CTA Chest from 11/09/23    HPI:  The patient is a 68 year old gentleman with a history of hypertension, bipolar disorder and attention deficit disorder, status post right thoracotomy in 2012 by Dr. Brantley for an inflammatory mass in the chest, osteoarthritis of multiple joints status post multiple orthopedic surgeries, and ascending aortic aneurysm who was seen by Dr. Army in November 2021.  He was noted to have an enlarged aorta on his CT scan of the shoulder done by orthopedic surgery.  CT of the chest without contrast at that time showed the ascending aorta to measure 5.1 x 4.5 cm.  Review of previous CT scans of the chest without contrast dating back to 2012 showed the aneurysm manage did not appear significantly changed. Dr. Army got a CTA of the chest and this showed the ascending aorta to have a dimension of 4.7 x 4.5 cm.  Continued follow-up was recommended.  He has not been back to our office since.  He reports that he feels well overall without chest pain or shortness of breath.  He goes to the gym 5 to 7 days/week and does cardio as well as weightlifting.  Past Medical History:  Diagnosis Date   Abscess of lung (HCC)    Right lower lobe  IMO SNOMED Dx Update Oct 2024     Abscess of lung(513.0)    Right lower lobe   ADD (attention deficit disorder)    Anxiety    Arthritis    Ascending aortic aneurysm (HCC)    47mm by Chest CTA 05/2020 and 48mm by echo 05/2020   Benign essential HTN    Bipolar depression (HCC)    Coronary artery calcification seen on CAT scan    Empyema lung (HCC)    Hemorrhoids    Hepatitis C    History of lumbar laminectomy for spinal cord decompression 01/08/2022    History of total hip arthroplasty 04/23/2011   Pneumonia    last year   Prostate abscess    Staph infection    Viral pericarditis    after COVID 19 infection    Past Surgical History:  Procedure Laterality Date   ELBOW SURGERY  2005   Right   FOOT NEUROMA SURGERY  2006   Left   HIP RESURFACING     left hip [facility] 01/2011   LUMBAR LAMINECTOMY/DECOMPRESSION MICRODISCECTOMY  07/24/2011   Procedure: LUMBAR LAMINECTOMY/DECOMPRESSION MICRODISCECTOMY;  Surgeon: Alm GORMAN Molt;  Location: [facility];  Service: Neurosurgery;  Laterality: Bilateral;  Bilateral  , Lumbar Three-Four, Lumbar Four-Five Decompressive Laminectomy Rm # [id number]   LUNG SURGERY     for empyema   SHOULDER ARTHROSCOPY Right 07/05/2013   Procedure: RIGHT SHOULDER ARTHROSCOPY WITH DEBRIDEMENT;  Surgeon: Lonni CINDERELLA Poli, MD;  Location: [facility] OR;  Service: Orthopedics;  Laterality: Right;   TENDON TRANSFER Left 01/26/2014   Procedure: LEFT THUMB TRAPEZIECTOMY KNOTTED TENDON INTRAPOSITION TRANSFER OF ABDUCTOR POLLICIS LONGUS TO THENARS;  Surgeon: Lamar Leonor Mickey LULLA, MD;  Location: [facility];  Service: Orthopedics;  Laterality: Left;   THORACOTOMY  November 14 2010   rt  Family History  Problem Relation Age of Onset   COPD Mother    Heart disease Mother    Hypertension Mother    Coronary artery disease Father    Dementia Father    Heart failure Father    Schizophrenia Brother    Breast cancer Maternal Grandmother    Prostate cancer Paternal Grandfather        also had bone cancer   Cancer Paternal Grandmother        unsure what kind    Social History Social History   Tobacco Use   Smoking status: Never   Smokeless tobacco: Never  Vaping Use   Vaping status: Never Used  Substance Use Topics   Alcohol use: No    Comment: quit drinking in 1989   Drug use: Not Currently    Types: Hydrocodone     Comment: Prior heavy use of opioid pain medications up until 2 years ago    Current Outpatient  Medications  Medication Sig Dispense Refill   ALPRAZolam  (XANAX ) 1 MG tablet Take 1 tablet (1 mg total) by mouth 3 (three) times daily as needed for anxiety. 90 tablet 4   amphetamine -dextroamphetamine  (ADDERALL  XR) 30 MG 24 hr capsule Take 1 capsule (30 mg) by mouth every morning. 30 capsule 0   amphetamine -dextroamphetamine  (ADDERALL ) 20 MG tablet Take 1 tablet (20 mg total) by mouth daily. 30 tablet 0   cariprazine  (VRAYLAR ) 1.5 MG capsule TAKE 1 CAPSULE BY MOUTH DAILY 30 capsule 4   cephALEXin  (KEFLEX ) 500 MG capsule Take 1 capsule (500 mg total) by mouth 3 (three) times daily. 21 capsule 0   lamoTRIgine  (LAMICTAL ) 200 MG tablet Take 1 tablet (200 mg total) by mouth daily. 90 tablet 1   silodosin (RAPAFLO) 8 MG CAPS capsule Take 8 mg by mouth daily.     terbinafine  (LAMISIL ) 250 MG tablet Take 1 tablet (250 mg total) by mouth daily. 30 tablet 2   No current facility-administered medications for this visit.    Allergies  Allergen Reactions   Quinolones Other (See Comments)    Patient was warned about not using Cipro  and similar antibiotics. Recent studies have raised concern that fluoroquinolone antibiotics could be associated with an increased risk of aortic aneurysm Fluoroquinolones have non-antimicrobial properties that might jeopardise the integrity of the extracellular matrix of the vascular wall In a  propensity score matched cohort study in [place], there was a 66% increased rate of aortic aneurysm or dissection associated with oral fluoroquinolone use, compared wit    Review of Systems  Constitutional:  Negative for activity change and fatigue.  HENT: Negative.    Eyes: Negative.   Respiratory:  Negative for shortness of breath.   Cardiovascular:  Negative for chest pain and leg swelling.  Gastrointestinal: Negative.   Endocrine: Negative.   Genitourinary: Negative.   Musculoskeletal:  Positive for arthralgias.  Allergic/Immunologic: Negative.   Neurological:  Negative for  dizziness and syncope.  Hematological: Negative.     BP (!) 140/76   Pulse 78   Resp 20   Ht 5' 7.5 (1.715 m)   Wt 168 lb (76.2 kg)   SpO2 97% Comment: RA  BMI 25.92 kg/m  Physical Exam Constitutional:      Appearance: Normal appearance. He is normal weight.  HENT:     Head: Normocephalic and atraumatic.  Eyes:     Extraocular Movements: Extraocular movements intact.     Pupils: Pupils are equal, round, and reactive to light.  Cardiovascular:  Rate and Rhythm: Normal rate and regular rhythm.  Pulmonary:     Effort: Pulmonary effort is normal.     Breath sounds: Normal breath sounds.  Musculoskeletal:        General: No swelling.     Cervical back: Normal range of motion and neck supple.  Skin:    General: Skin is warm and dry.  Neurological:     General: No focal deficit present.     Mental Status: He is alert and oriented to person, place, and time.  Psychiatric:        Mood and Affect: Mood normal.        Behavior: Behavior normal.      Diagnostic Tests:  Narrative & Impression  CLINICAL DATA:  Ascending thoracic aortic aneurysm   EXAM: CT ANGIOGRAPHY CHEST WITH CONTRAST   TECHNIQUE: Multidetector CT imaging of the chest was performed using the standard protocol during bolus administration of intravenous contrast. Multiplanar CT image reconstructions and MIPs were obtained to evaluate the vascular anatomy. Multiplanar image (3D post-processing) reconstructions and MIPs were obtained to evaluate the vascular anatomy.   RADIATION DOSE REDUCTION: This exam was performed according to the departmental dose-optimization program which includes automated exposure control, adjustment of the mA and/or kV according to patient size and/or use of iterative reconstruction technique.   CONTRAST:  75mL ISOVUE -370 IOPAMIDOL  (ISOVUE -370) INJECTION 76%   COMPARISON:  CT angiogram of the chest performed November 10, 2022   FINDINGS: Cardiovascular: Enlarged heart. No  pericardial effusion. Motion related changes are present in the aortic root.   Coronary sinus: 40 mm   Tubular ascending thoracic aorta: 48 mm   Aortic arch: 36 mm   Proximal descending thoracic aorta: 27 mm   Descending thoracic aorta at the level of the aortic hiatus: 27 mm   Main pulmonary artery: Within normal limits   Mediastinum/Nodes: No enlarged mediastinal, hilar, or axillary lymph nodes. Thyroid  gland, trachea, and esophagus demonstrate no significant findings.   Lungs/Pleura: Lungs are clear. No pleural effusion or pneumothorax.   Upper Abdomen: No acute abnormality.   Musculoskeletal: No chest wall abnormality. No acute or significant osseous findings.   Review of the MIP images confirms the above findings.   IMPRESSION: 1. Aneurysm of the tubular ascending thoracic aorta measuring 4.8 cm, previously 4.6 cm. 2. Ascending thoracic aortic aneurysm. Recommend semi-annual imaging followup by CTA or MRA and referral to cardiothoracic surgery if not already obtained. This recommendation follows 2010 ACCF/AHA/AATS/ACR/ASA/SCA/SCAI/SIR/STS/SVM Guidelines for the Diagnosis and Management of Patients With Thoracic Aortic Disease. Circulation. 2010; 121: Z733-z630. Aortic aneurysm NOS (ICD10-I71.9)     Electronically Signed   By: Maude Naegeli M.D.   On: 11/14/2023 10:16       ECHOCARDIOGRAM REPORT       Patient Name:   Kentravious HOSAM MCFETRIDGE Date of Exam: 11/10/2020  Medical Rec #:  [id number]   Height:       68.0 in  Accession #:    [id number]  Weight:       164.5 lb  Date of Birth:  Nov 03, 1955   BSA:          1.881 m  Patient Age:    64 years    BP:           118/81 mmHg  Patient Gender: M           HR:           76 bpm.  Exam Location:  Inpatient   Procedure:  2D Echo, Cardiac Doppler and Color Doppler   Indications:    R07.9* Chest pain, unspecified    History:        Patient has prior history of Echocardiogram examinations,  most                 recent 06/05/2020.  Signs/Symptoms:Chest Pain. H/O Covid.                  Pericarditis like symptoms.    Sonographer:    Vernell Hammersmith RDCS  Referring Phys: [id number] CHRISTOPHER L SCHUMANN   IMPRESSIONS     1. Left ventricular ejection fraction, by estimation, is 60 to 65%. The  left ventricle has normal function. The left ventricle has no regional  wall motion abnormalities. There is mild left ventricular hypertrophy.  Left ventricular diastolic parameters  are consistent with Grade I diastolic dysfunction (impaired relaxation).   2. Right ventricular systolic function is normal. The right ventricular  size is normal. Tricuspid regurgitation signal is inadequate for assessing  PA pressure.   3. The mitral valve is normal in structure. Trivial mitral valve  regurgitation. No evidence of mitral stenosis.   4. The aortic valve is previously reported as bicuspid, and appears to  have fusion of right and left coronary cusps. Aortic valve regurgitation  is mild. No aortic stenosis is present.   5. Aortic dilatation noted. Aneurysm of the ascending aorta, measuring 47  mm.   6. Trivial pericardial effusion.   FINDINGS   Left Ventricle: Left ventricular ejection fraction, by estimation, is 60  to 65%. The left ventricle has normal function. The left ventricle has no  regional wall motion abnormalities. The left ventricular internal cavity  size was normal in size. There is   mild left ventricular hypertrophy. Left ventricular diastolic parameters  are consistent with Grade I diastolic dysfunction (impaired relaxation).   Right Ventricle: The right ventricular size is normal. No increase in  right ventricular wall thickness. Right ventricular systolic function is  normal. Tricuspid regurgitation signal is inadequate for assessing PA  pressure.   Left Atrium: Left atrial size was normal in size.   Right Atrium: Right atrial size was normal in size.   Pericardium: Trivial pericardial effusion is present.    Mitral Valve: The mitral valve is normal in structure. Trivial mitral  valve regurgitation. No evidence of mitral valve stenosis.   Tricuspid Valve: The tricuspid valve is normal in structure. Tricuspid  valve regurgitation is not demonstrated. No evidence of tricuspid  stenosis.   Aortic Valve: The aortic valve is bicuspid. Aortic valve regurgitation is  mild. No aortic stenosis is present. Aortic valve mean gradient measures  5.0 mmHg. Aortic valve peak gradient measures 8.2 mmHg. Aortic valve area,  by VTI measures 3.85 cm.   Pulmonic Valve: The pulmonic valve was normal in structure. Pulmonic valve  regurgitation is trivial. No evidence of pulmonic stenosis.   Aorta: The aortic root is normal in size and structure and aortic  dilatation noted. There is an aneurysm involving the ascending aorta  measuring 47 mm.   Venous: The inferior vena cava was not well visualized.   IAS/Shunts: No atrial level shunt detected by color flow Doppler.     LEFT VENTRICLE  PLAX 2D  LVIDd:         4.10 cm  Diastology  LVIDs:         2.40 cm  LV e' medial:    8.38 cm/s  LV PW:  1.00 cm  LV E/e' medial:  11.9  LV IVS:        1.20 cm  LV e' lateral:   11.20 cm/s  LVOT diam:     2.10 cm  LV E/e' lateral: 8.9  LV SV:         111  LV SV Index:   59  LVOT Area:     3.46 cm     RIGHT VENTRICLE  RV Basal diam:  4.00 cm   LEFT ATRIUM             Index       RIGHT ATRIUM           Index  LA diam:        3.60 cm 1.91 cm/m  RA Area:     24.70 cm  LA Vol (A2C):   98.3 ml 52.27 ml/m RA Volume:   76.00 ml  40.41 ml/m  LA Vol (A4C):   46.0 ml 24.46 ml/m  LA Biplane Vol: 70.6 ml 37.54 ml/m   AORTIC VALVE  AV Area (Vmax):    3.78 cm  AV Area (Vmean):   3.67 cm  AV Area (VTI):     3.85 cm  AV Vmax:           143.00 cm/s  AV Vmean:          101.000 cm/s  AV VTI:            0.289 m  AV Peak Grad:      8.2 mmHg  AV Mean Grad:      5.0 mmHg  LVOT Vmax:         156.00 cm/s  LVOT  Vmean:        107.000 cm/s  LVOT VTI:          0.321 m  LVOT/AV VTI ratio: 1.11    AORTA  Ao Root diam: 3.40 cm  Ao Asc diam:  4.70 cm   MITRAL VALVE  MV Area (PHT): 4.63 cm     SHUNTS  MV Decel Time: 164 msec     Systemic VTI:  0.32 m  MV E velocity: 99.40 cm/s   Systemic Diam: 2.10 cm  MV A velocity: 111.00 cm/s  MV E/A ratio:  0.90   Soyla Merck MD  Electronically signed by Soyla Merck MD  Signature Date/Time: 11/10/2020/2:14:02 PM        Final      Impression:  This 68 year old gentleman has a bicuspid aortic valve with mild insufficiency and no stenosis on his last echocardiogram in April 2022 and a 4.8 cm fusiform ascending aortic aneurysm on recent CTA of the chest.  He has had CT scans of the chest dating back to 2012.  He has CT scans of the chest since 2021 have shown the measurement between 4.6 and 5.1 cm depending on the scan and the measurements by radiology have been up and down from year to year.  I have personally reviewed all the scans and measured the aorta.  I think it has been stable at 4.7-4.8 cm since 2021. His aneurysm is still below the surgical threshold of 5.0 cm.  I reviewed the CT images with him and answered all of his questions.  I stressed the importance of continued good blood pressure control in preventing further enlargement and acute aortic dissection.  I advised him against doing any heavy lifting that may require a Valsalva maneuver and could suddenly raise his blood pressure  to high levels.   Plan:  I will plan to see him back in 1 year with a CTA of the chest for aortic surveillance.  I spent 30 minutes performing this consultation and > 50% of this time was spent face to face counseling and coordinating the care of this patient's bicuspid aortic valve and ascending aortic aneurysm.   Dorise MARLA Fellers, MD [facility] [phone]

## 2024-02-01 ENCOUNTER — Telehealth: Payer: Self-pay | Admitting: Behavioral Health

## 2024-02-01 NOTE — Telephone Encounter (Signed)
LF 6/12, due 7/10

## 2024-02-01 NOTE — Telephone Encounter (Signed)
 Pt needs refill on Adderall  20mg  & 30mg  sent to  [facility] [address]

## 2024-02-02 ENCOUNTER — Ambulatory Visit (INDEPENDENT_AMBULATORY_CARE_PROVIDER_SITE_OTHER): Admitting: Student in an Organized Health Care Education/Training Program

## 2024-02-02 ENCOUNTER — Encounter: Payer: Self-pay | Admitting: Student in an Organized Health Care Education/Training Program

## 2024-02-02 VITALS — BP 133/74 | HR 73 | Wt 167.0 lb

## 2024-02-02 DIAGNOSIS — B353 Tinea pedis: Secondary | ICD-10-CM | POA: Diagnosis not present

## 2024-02-02 NOTE — Patient Instructions (Signed)
  VISIT SUMMARY: You came in today to discuss a persistent foot condition that has not healed, primarily located between your little toe and the adjacent toes. You have a history of foot fungus and cellulitis, and you have been treated with antifungal medication and antibiotics in the past. You also mentioned a long-standing issue with toenail fungus and intermittent back pain.  YOUR PLAN: -TINEA PEDIS WITH SECONDARY CELLULITIS: Tinea pedis, commonly known as athlete's foot, is a fungal infection that affects the skin of the feet. It can lead to secondary cellulitis, a bacterial skin infection. Your cellulitis has improved with antibiotics, but the fungal infection persists due to wet conditions and inconsistent use of clotrimazole cream. You should complete an eight-week course of terbinafine , keep your feet dry, change socks and shoes if they get wet, and consider using waterproof boots or open-toe shoes. Wash your hands after touching the affected area. A referral to a podiatrist for possible nail removal is also recommended.  -CHRONIC TOENAIL FUNGUS: Chronic toenail fungus is a long-term fungal infection of the toenail. You have had this issue for about two decades. If you are interested, a referral to a podiatrist for nail removal can be considered.  -BACK PAIN: You have intermittent back pain that is not severe at the moment. No specific treatment was discussed today.  INSTRUCTIONS: Please complete the eight-week course of terbinafine  as prescribed. Keep your feet dry, change socks and shoes if they get wet, and consider using waterproof boots or open-toe shoes. Wash your hands after touching the affected area. If you are interested in addressing your chronic toenail fungus, consider a referral to a podiatrist for nail removal. Follow up with us  if your symptoms do not improve or if you have any concerns.

## 2024-02-02 NOTE — Progress Notes (Signed)
   Acute Office Visit  Subjective:     Patient ID: Paul Ray, male    DOB: 12-26-1955, 68 y.o.   MRN: [id number]  Chief Complaint  Patient presents with   Rash    Patient states his left foot does not seem to be healing very well. Patient was seen on 6/19 with Dr.Tabori for cellulitis of left foot.     HPI  Discussed the use of AI scribe software for clinical note transcription with the patient, who gave verbal consent to proceed.  History of Present Illness Paul Ray is a 68 year old male with a history of foot fungus and cellulitis who presents with a non-healing foot condition.  He has a persistent foot condition primarily located between his right 3rd and 4th toes, characterized by redness and a white appearance. The condition was initially worse in June, presenting differently than it does now, which he describes as looking like a bruise.  He was previously treated for a fungal infection in May and cellulitis in June. He was prescribed an antifungal medication in May and an antibiotic, Cefalexin, in June. He completed the antibiotic course and continues to take the antifungal medication. He has been inconsistent with applying clotrimazole cream, using it about three to four days a week.  He has a long-standing issue with a toenail, which has been problematic for about two decades.   His lifestyle includes frequent fishing trips, during which his feet get wet from morning dew. He owns several pairs of shoes, some of which are used specifically for fishing and have developed a bad odor. He acknowledges that his feet remain wet for a couple of hours during these activities, which he suspects contributes to the difficulty in treating the fungal infection.  No pain in the foot currently, only redness and a white appearance between the toes.      Objective:    BP 133/74   Pulse 73   Wt 167 lb (75.8 kg)   SpO2 97%   BMI 25.77 kg/m   Physical Exam  Gen: Well-appearing  man Foot: On the right foot there is a small area of erythema between the 3rd and 4th toes.  There is a small fissure between those toes.  He has onychomycosis on the second toenail.  Overall the appearance of the tinea pedis is improved compared to when I saw in May.  No fluctuance, no other skin breakdown.  Foot is warm and well-perfused.  No pain with movement of the toes.      Assessment & Plan:    Problem List Items Addressed This Visit       Unprioritized   Tinea pedis - Primary   Chronic tinea pedis with secondary cellulitis. Cellulitis improved with cephalexin . Fungal infection persists due to wet conditions and inconsistent clotrimazole use. Discussed importance of keeping feet dry. Itraconazole considered but not preferred due to side effects. - Complete terbinafine  for eight weeks.  Overall fungus is improving, but may not be able to fully resolve. - Encourage keeping feet dry, change socks and shoes if wet. - Consider waterproof boots or open-toe shoes. - Wash hands after touching affected area. - Consider podiatry referral for nail removal.       Return if symptoms worsen or fail to improve.  Cleatus Debby Specking, MD

## 2024-02-02 NOTE — Assessment & Plan Note (Signed)
 Chronic tinea pedis with secondary cellulitis. Cellulitis improved with cephalexin . Fungal infection persists due to wet conditions and inconsistent clotrimazole use. Discussed importance of keeping feet dry. Itraconazole considered but not preferred due to side effects. - Complete terbinafine  for eight weeks.  Overall fungus is improving, but may not be able to fully resolve. - Encourage keeping feet dry, change socks and shoes if wet. - Consider waterproof boots or open-toe shoes. - Wash hands after touching affected area. - Consider podiatry referral for nail removal.

## 2024-02-03 ENCOUNTER — Telehealth: Payer: Self-pay | Admitting: Behavioral Health

## 2024-02-03 NOTE — Telephone Encounter (Signed)
 error

## 2024-02-04 ENCOUNTER — Other Ambulatory Visit: Payer: Self-pay

## 2024-02-04 DIAGNOSIS — F3160 Bipolar disorder, current episode mixed, unspecified: Secondary | ICD-10-CM

## 2024-02-04 DIAGNOSIS — F902 Attention-deficit hyperactivity disorder, combined type: Secondary | ICD-10-CM

## 2024-02-04 DIAGNOSIS — F411 Generalized anxiety disorder: Secondary | ICD-10-CM

## 2024-02-04 DIAGNOSIS — F331 Major depressive disorder, recurrent, moderate: Secondary | ICD-10-CM

## 2024-02-04 MED ORDER — AMPHETAMINE-DEXTROAMPHET ER 30 MG PO CP24: ORAL_CAPSULE | ORAL | 0 refills | Status: DC

## 2024-02-04 MED ORDER — AMPHETAMINE-DEXTROAMPHETAMINE 20 MG PO TABS: 20.0000 mg | ORAL_TABLET | Freq: Every day | ORAL | 0 refills | Status: DC

## 2024-02-04 NOTE — Telephone Encounter (Signed)
 Pended.

## 2024-02-25 ENCOUNTER — Telehealth (INDEPENDENT_AMBULATORY_CARE_PROVIDER_SITE_OTHER): Payer: Self-pay | Admitting: Otolaryngology

## 2024-02-25 NOTE — Telephone Encounter (Signed)
 Tried to LVM to Confirm appt & location - VM FULL [phone] [id number]

## 2024-02-26 ENCOUNTER — Ambulatory Visit (INDEPENDENT_AMBULATORY_CARE_PROVIDER_SITE_OTHER): Admitting: Otolaryngology

## 2024-02-26 ENCOUNTER — Encounter (INDEPENDENT_AMBULATORY_CARE_PROVIDER_SITE_OTHER): Payer: Self-pay | Admitting: Otolaryngology

## 2024-02-26 ENCOUNTER — Ambulatory Visit (INDEPENDENT_AMBULATORY_CARE_PROVIDER_SITE_OTHER): Admitting: Audiology

## 2024-02-26 VITALS — BP 133/75 | HR 74

## 2024-02-26 DIAGNOSIS — H9313 Tinnitus, bilateral: Secondary | ICD-10-CM

## 2024-02-26 DIAGNOSIS — H9193 Unspecified hearing loss, bilateral: Secondary | ICD-10-CM

## 2024-02-26 DIAGNOSIS — H903 Sensorineural hearing loss, bilateral: Secondary | ICD-10-CM

## 2024-02-26 NOTE — Progress Notes (Signed)
 ENT CONSULT:  Reason for Consult: decreased hearing    HPI: Discussed the use of AI scribe software for clinical note transcription with the patient, who gave verbal consent to proceed.  History of Present Illness Paul Ray is a 68 year old male who presents with decreased hearing.  He has experienced decreased hearing for a long time and previously had his hearing checked, which confirmed hearing loss. He was provided with hearing aids almost twenty years ago but found them frustrating and stopped using them.  He mentions experiencing some ringing in his ears, although he does not describe it as persistent or significant. He reports no history of ear surgeries. He obtained his previous hearing aids through [facility]. No ear pain drainage or prior ear infections.      Past Medical History:  Diagnosis Date   Abscess of lung (HCC)    Right lower lobe  IMO SNOMED Dx Update Oct 2024     Abscess of lung(513.0)    Right lower lobe   ADD (attention deficit disorder)    Anxiety    Arthritis    Ascending aortic aneurysm (HCC)    47mm by Chest CTA 05/2020 and 48mm by echo 05/2020   Benign essential HTN    Bipolar depression (HCC)    Coronary artery calcification seen on CAT scan    Empyema lung (HCC)    Hemorrhoids    Hepatitis C    History of lumbar laminectomy for spinal cord decompression 01/08/2022   History of total hip arthroplasty 04/23/2011   Pneumonia    last year   Prostate abscess    Staph infection    Viral pericarditis    after COVID 19 infection    Past Surgical History:  Procedure Laterality Date   ELBOW SURGERY  2005   Right   FOOT NEUROMA SURGERY  2006   Left   HIP RESURFACING     [facility] hip [facility] 01/2011   LUMBAR LAMINECTOMY/DECOMPRESSION MICRODISCECTOMY  07/24/2011   Procedure: LUMBAR LAMINECTOMY/DECOMPRESSION MICRODISCECTOMY;  Surgeon: Alm GORMAN Molt;  Location: [facility];  Service: Neurosurgery;  Laterality: Bilateral;  Bilateral  , Lumbar Three-Four,  Lumbar Four-Five Decompressive Laminectomy Rm # [id number]   LUNG SURGERY     for empyema   SHOULDER ARTHROSCOPY Right 07/05/2013   Procedure: RIGHT SHOULDER ARTHROSCOPY WITH DEBRIDEMENT;  Surgeon: Lonni CINDERELLA Poli, MD;  Location: [facility] OR;  Service: Orthopedics;  Laterality: Right;   TENDON TRANSFER Left 01/26/2014   Procedure: LEFT THUMB TRAPEZIECTOMY KNOTTED TENDON INTRAPOSITION TRANSFER OF ABDUCTOR POLLICIS LONGUS TO THENARS;  Surgeon: Lamar Leonor Mickey LULLA, MD;  Location: [facility];  Service: Orthopedics;  Laterality: Left;   THORACOTOMY  November 14 2010   rt    Family History  Problem Relation Age of Onset   COPD Mother    Heart disease Mother    Hypertension Mother    Coronary artery disease Father    Dementia Father    Heart failure Father    Schizophrenia Brother    Breast cancer Maternal Grandmother    Prostate cancer Paternal Grandfather        also had bone cancer   Cancer Paternal Grandmother        unsure what kind    Social History:  reports that he has never smoked. He has never used smokeless tobacco. He reports that he does not currently use drugs after having used the following drugs: Hydrocodone . He reports that he does not drink alcohol.  Allergies:  Allergies  Allergen Reactions   Quinolones Other (See Comments)    Patient was warned about not using Cipro  and similar antibiotics. Recent studies have raised concern that fluoroquinolone antibiotics could be associated with an increased risk of aortic aneurysm Fluoroquinolones have non-antimicrobial properties that might jeopardise the integrity of the extracellular matrix of the vascular wall In a  propensity score matched cohort study in [place], there was a 66% increased rate of aortic aneurysm or dissection associated with oral fluoroquinolone use, compared wit    Medications: I have reviewed the patient's current medications.  The PMH, PSH, Medications, Allergies, and SH were reviewed and  updated.  ROS: Constitutional: Negative for fever, weight loss and weight gain. Cardiovascular: Negative for chest pain and dyspnea on exertion. Respiratory: Is not experiencing shortness of breath at rest. Gastrointestinal: Negative for nausea and vomiting. Neurological: Negative for headaches. Psychiatric: The patient is not nervous/anxious  There were no vitals taken for this visit.  PHYSICAL EXAM:  Exam: General: Well-developed, well-nourished Communication and Voice: Clear pitch and clarity Respiratory Respiratory effort: Equal inspiration and expiration without stridor Cardiovascular Peripheral Vascular: Warm extremities with equal color/perfusion Eyes: No nystagmus with equal extraocular motion bilaterally Neuro/Psych/Balance: Patient oriented to person, place, and time; Appropriate mood and affect; Gait is intact with no imbalance; Cranial nerves I-XII are intact Head and Face Inspection: Normocephalic and atraumatic without mass or lesion Palpation: Facial skeleton intact without bony stepoffs Salivary Glands: No mass or tenderness Facial Strength: Facial motility symmetric and full bilaterally ENT Pinna: External ear intact and fully developed External canal: Canal is patent with intact skin Tympanic Membrane: Clear and mobile External Nose: No scar or anatomic deformity Internal Nose: Septum intact and midline. No edema, polyp, or rhinorrhea Lips, Teeth, and gums: Mucosa and teeth intact and viable TMJ: No pain to palpation with full mobility Oral cavity/oropharynx: No erythema or exudate, no lesions present Neck Neck and Trachea: Midline trachea without mass or lesion Thyroid : No mass or nodularity Lymphatics: No lymphadenopathy   Studies Reviewed: Audio today Tympanometry: Right ear: Type A- Normal external ear canal volume with normal middle ear pressure and tympanic membrane compliance. Left ear: Type A- Normal external ear canal volume with normal middle  ear pressure and tympanic membrane compliance.   Pure tone Audiometry: Right ear- Normal hearing from [phone] Hz, then moderate to moderately severe sensorineural hearing loss from 2000 Hz - 8000 Hz. Left ear-  Normal hearing from 125-2000Hz , then moderately severe sensorineural hearing loss from 3000 Hz - 8000 Hz. Of note, 2000 Hz in worse in the right ear, then 3000-8000 Hz is slighhtly worse in the left ear. Speech Audiometry: Right ear- Speech Reception Threshold (SRT) was obtained at 10 dBHL. Left ear-Speech Reception Threshold (SRT) was obtained at 10 dBHL.   Word Recognition Score Tested using NU-6 (recorded) Right ear: 92% was obtained at a presentation level of 75 dBHL with contralateral masking which is deemed as  excellent. Left ear: 100% was obtained at a presentation level of 75 dBHL with contralateral masking which is deemed as  excellent.  Assessment/Plan: Encounter Diagnosis  Name Primary?   Decreased hearing of both ears Yes   Assessment and Plan Assessment & Plan Sensorineural hearing loss, symmetric and bilateral  Moderate to severe SNHL b/l. Previous trial of hearing aids in the past through [facility], he did not find them helpful.  - we discussed that he is a candidate for hearing aids and I encouraged him to try hearing aids again -  can schedule appt with [facility]   Thank you for allowing me to participate in the care of this patient. Please do not hesitate to contact me with any questions or concerns.   Elena Larry, MD Otolaryngology [facility] Phone: [phone] Fax: [phone]    02/26/2024, 7:55 AM

## 2024-02-26 NOTE — Progress Notes (Signed)
  [address] [place], [place] [address] [id number]  Audiological Evaluation    Name: Paul Ray     DOB:   01-04-1956      MRN:   [id number]                                                                                     Service Date: 02/26/2024     Accompanied by: unaccompanied   Patient comes today after Dr. Soldatova, ENT sent a referral for a hearing evaluation due to concerns with hearing loss.   Symptoms Yes Details  Hearing loss  [x]  Longstanding in both ears - worse in the left for years  Tinnitus  [x]  Both ears- is not bothersome for him- longstanding  Ear pain/ infections/pressure  []    Balance problems  []    Noise exposure history  [x]  Concerts and worked in [employer]  Previous ear surgeries  []    Family history of hearing loss  []    Amplification  [x]  20 years ago wore hearing aids from [employer] - did not get used to them   Other  []      Otoscopy: Right ear: Clear external ear canal and notable landmarks visualized on the tympanic membrane. Left ear:  Clear external ear canal and notable landmarks visualized on the tympanic membrane.  Tympanometry: Right ear: Type A- Normal external ear canal volume with normal middle ear pressure and tympanic membrane compliance. Left ear: Type A- Normal external ear canal volume with normal middle ear pressure and tympanic membrane compliance.   Pure tone Audiometry: Right ear- Normal hearing from [phone], then moderate to moderately severe sensorineural hearing loss from 2000 Hz - 8000 Hz. Left ear-  Normal hearing from 125-2000Hz , then moderately severe sensorineural hearing loss from 3000 Hz - 8000 Hz. Of note, 2000 Hz in worse in the right ear, then 3000-8000 Hz is slighhtly worse in the left ear. Speech Audiometry: Right ear- Speech Reception Threshold (SRT) was obtained at 10 dBHL. Left ear-Speech Reception Threshold (SRT) was obtained at 10 dBHL.   Word Recognition Score Tested using NU-6  (recorded) Right ear: 92% was obtained at a presentation level of 75 dBHL with contralateral masking which is deemed as  excellent. Left ear: 100% was obtained at a presentation level of 75 dBHL with contralateral masking which is deemed as  excellent.   The hearing test results were completed under headphones and re-checked with inserts and results are deemed to be of good reliability. Test technique:  conventional        Recommendations: Follow up with ENT as scheduled for today.  Return for a hearing evaluation if concerns with hearing changes arise or per MD recommendation. Consider a communication needs assessment after medical clearance for hearing aids is obtained. Recommend real ear measurements if fit with hearing aids.   Farhaan Mabee MARIE LEROUX-MARTINEZ, AUD

## 2024-03-01 ENCOUNTER — Telehealth (INDEPENDENT_AMBULATORY_CARE_PROVIDER_SITE_OTHER): Payer: Self-pay | Admitting: Audiology

## 2024-03-01 NOTE — Telephone Encounter (Signed)
 Please call patient.  He has several questions regarding Hearing Test on 02/26/2024

## 2024-03-02 ENCOUNTER — Telehealth: Payer: Self-pay | Admitting: Behavioral Health

## 2024-03-02 NOTE — Telephone Encounter (Signed)
 Next visit is 03/11/24. Requesting refills on Xanax  1 mg, Adderall  XR 30 mg and Adderall  20 mg called to:  [facility] [id number] [place], [place] - [address]   Phone: [phone]  Fax: [phone]

## 2024-03-02 NOTE — Telephone Encounter (Signed)
 Pt already has RF at the pharmacy for both doses of Adderall . Alprazolam  LF 7/11, due 8/8.

## 2024-03-03 ENCOUNTER — Other Ambulatory Visit: Payer: Self-pay | Admitting: Behavioral Health

## 2024-03-03 DIAGNOSIS — G479 Sleep disorder, unspecified: Secondary | ICD-10-CM

## 2024-03-04 ENCOUNTER — Encounter: Payer: Self-pay | Admitting: Audiology

## 2024-03-04 NOTE — Telephone Encounter (Signed)
 Addressed thru pharmacy interface.

## 2024-03-11 ENCOUNTER — Encounter: Payer: Self-pay | Admitting: Behavioral Health

## 2024-03-11 ENCOUNTER — Ambulatory Visit (INDEPENDENT_AMBULATORY_CARE_PROVIDER_SITE_OTHER): Payer: PPO | Admitting: Behavioral Health

## 2024-03-11 DIAGNOSIS — F411 Generalized anxiety disorder: Secondary | ICD-10-CM | POA: Diagnosis not present

## 2024-03-11 DIAGNOSIS — F319 Bipolar disorder, unspecified: Secondary | ICD-10-CM

## 2024-03-11 DIAGNOSIS — F3160 Bipolar disorder, current episode mixed, unspecified: Secondary | ICD-10-CM

## 2024-03-11 DIAGNOSIS — F902 Attention-deficit hyperactivity disorder, combined type: Secondary | ICD-10-CM

## 2024-03-11 DIAGNOSIS — G479 Sleep disorder, unspecified: Secondary | ICD-10-CM

## 2024-03-11 MED ORDER — ALPRAZOLAM 1 MG PO TABS: 1.0000 mg | ORAL_TABLET | Freq: Three times a day (TID) | ORAL | 5 refills | Status: DC | PRN

## 2024-03-11 MED ORDER — LAMOTRIGINE 200 MG PO TABS: 200.0000 mg | ORAL_TABLET | Freq: Every day | ORAL | 1 refills | Status: AC

## 2024-03-11 MED ORDER — CARIPRAZINE HCL 1.5 MG PO CAPS: 1.5000 mg | ORAL_CAPSULE | Freq: Every day | ORAL | 5 refills | Status: AC

## 2024-03-11 NOTE — Progress Notes (Signed)
 [facility]  Patient ID: Paul Ray,  MRN: [id number]  PCP: Jerrell Cleatus Ned, MD  Date of Evaluation: 03/11/2024 Time spent:30 minutes  Chief Complaint:  Chief Complaint   Depression; Anxiety; Follow-up; Medication Refill; Patient Education; ADHD     HISTORY/CURRENT STATUS: HPI 68 year old male presents to this office for follow up and medication management. Very talkative today. Cooperative and very nice today.  He has been upset about his gym situation. Says he has been very verbal with the admin at the [facility] because pickel ball players  are taking over the gym and interfering with his basketball.  He admits he does not want to destroy his standing there or membership.  He is very content with his medication right now and says Vraylar  has been working great. Requesting no changes this visit.  His anxiety today is 2/10 and depression is 1/10.  He insist on 6 mo f/u's. He is sleeping 6-7 hours per night.  Trazodone  help a little. He denies current mania, no current psychosis, no SI/HI. He says he will request medical records.   Prior psychiatric medications as reported: Zoloft  Wellbutrin Prozac  Abilify Seroquel Zyprexa        Individual Medical History/ Review of Systems: Changes? :No   Allergies: Quinolones  Current Medications:  Current Outpatient Medications:    ALPRAZolam  (XANAX ) 1 MG tablet, Take 1 tablet (1 mg total) by mouth 3 (three) times daily as needed for anxiety., Disp: 90 tablet, Rfl: 5   amphetamine -dextroamphetamine  (ADDERALL  XR) 30 MG 24 hr capsule, Take 1 capsule (30 mg) by mouth every morning., Disp: 30 capsule, Rfl: 0   amphetamine -dextroamphetamine  (ADDERALL  XR) 30 MG 24 hr capsule, Take 1 capsule (30 mg) by mouth every morning. (Patient not taking: Reported on 02/26/2024), Disp: 30 capsule, Rfl: 0   amphetamine -dextroamphetamine  (ADDERALL ) 20 MG tablet, Take 1 tablet (20 mg total) by mouth daily., Disp: 30 tablet, Rfl: 0    amphetamine -dextroamphetamine  (ADDERALL ) 20 MG tablet, Take 1 tablet (20 mg total) by mouth daily. (Patient not taking: Reported on 02/26/2024), Disp: 30 tablet, Rfl: 0   cariprazine  (VRAYLAR ) 1.5 MG capsule, Take 1 capsule (1.5 mg total) by mouth daily., Disp: 30 capsule, Rfl: 5   lamoTRIgine  (LAMICTAL ) 200 MG tablet, Take 1 tablet (200 mg total) by mouth daily., Disp: 90 tablet, Rfl: 1   silodosin (RAPAFLO) 8 MG CAPS capsule, Take 8 mg by mouth daily., Disp: , Rfl:    terbinafine  (LAMISIL ) 250 MG tablet, Take 1 tablet (250 mg total) by mouth daily. (Patient not taking: Reported on 02/26/2024), Disp: 30 tablet, Rfl: 2 Medication Side Effects: none  Family Medical/ Social History: Changes? No  MENTAL HEALTH EXAM:  There were no vitals taken for this visit.There is no height or weight on file to calculate BMI.  General Appearance: Casual, Neat, and Well Groomed  Eye Contact:  Good  Speech:  Clear and Coherent  Volume:  Normal  Mood:  NA  Affect:  Appropriate  Thought Process:  Coherent  Orientation:  Full (Time, Place, and Person)  Thought Content: Logical   Suicidal Thoughts:  No  Homicidal Thoughts:  No  Memory:  WNL  Judgement:  Good  Insight:  Good  Psychomotor Activity:  Normal  Concentration:  Concentration: Good  Recall:  Good  Fund of Knowledge: Good  Language: Good  Assets:  Desire for Improvement  ADL's:  Intact  Cognition: WNL  Prognosis:  Good    DIAGNOSES:    ICD-10-CM   1.  Sleep disturbance  G47.9 ALPRAZolam  (XANAX ) 1 MG tablet    2. Bipolar depression (HCC)  F31.9 lamoTRIgine  (LAMICTAL ) 200 MG tablet    3. Generalized anxiety disorder  F41.1 cariprazine  (VRAYLAR ) 1.5 MG capsule    4. Bipolar I disorder, most recent episode mixed (HCC)  F31.60 cariprazine  (VRAYLAR ) 1.5 MG capsule    5. Attention deficit hyperactivity disorder (ADHD), combined type  F90.2       Receiving Psychotherapy: No    RECOMMENDATIONS:   Greater than 50% of 30  min face to face  time with patient was spent on counseling and coordination of care.  He is calm and collected this visit which is pleasant to see with him.  Very calm and collected this visit. Says that he is doing good overall. Some stressors causing anger periodically but he is learning to control. Would like to continue with 6 mo f/u.Happy with his medications. Requesting no changes today.     We agreed today to:   Continue on Vraylar  1.5 mg daily. capsule daily.  Continue Adderall . 30 mg ER in the am and 20 mg IR in the afternoon. Educated Pt that 60 mg was my prescribing limit per day.  Continue Trazodone  50 mg at bedtime daily Continue Lamictal  200 mg daily To report worsening symptoms or side effects promptly. Provided emergency contact information Discussed potential benefits, risks, and side effects of stimulants with patient to include increased heart rate, palpitations, insomnia, increased anxiety, increased irritability, or decreased appetite.  Instructed patient to contact office if experiencing any significant tolerability issues.  Discussed potential metabolic side effects associated with atypical antipsychotics, as well as potential risk for movement side effects. Advised pt to contact office if movement side effects occur.   Discussed potential benefits, risk, and side effects of benzodiazepines to include potential risk of tolerance and dependence, as well as possible drowsiness.  Advised patient not to drive if experiencing drowsiness and to take lowest possible effective dose to minimize risk of dependence and tolerance.  Monitor for any sign of rash. Please taking Lamictal  and contact office immediately rash develops. Recommend seeking urgent medical attention if rash is severe and/or spreading quickly.  Reviewed PDMP    Redell DELENA Pizza, NP

## 2024-03-18 ENCOUNTER — Ambulatory Visit (INDEPENDENT_AMBULATORY_CARE_PROVIDER_SITE_OTHER): Admitting: Student in an Organized Health Care Education/Training Program

## 2024-03-18 ENCOUNTER — Encounter: Payer: Self-pay | Admitting: Student in an Organized Health Care Education/Training Program

## 2024-03-18 VITALS — BP 123/65 | HR 75 | Wt 165.0 lb

## 2024-03-18 DIAGNOSIS — H9113 Presbycusis, bilateral: Secondary | ICD-10-CM

## 2024-03-18 DIAGNOSIS — I10 Essential (primary) hypertension: Secondary | ICD-10-CM

## 2024-03-18 DIAGNOSIS — F319 Bipolar disorder, unspecified: Secondary | ICD-10-CM | POA: Diagnosis not present

## 2024-03-18 DIAGNOSIS — B353 Tinea pedis: Secondary | ICD-10-CM | POA: Diagnosis not present

## 2024-03-18 NOTE — Progress Notes (Signed)
   Established Patient Office Visit  Subjective   Patient ID: ELLA GUILLOTTE, male    DOB: 20-Jul-1956  Age: 68 y.o. MRN: [id number]  Chief Complaint  Patient presents with   Medical Management of Chronic Issues    3 month follow up for tinea pedis of both feet     HPI  Discussed the use of AI scribe software for clinical note transcription with the patient, who gave verbal consent to proceed.  History of Present Illness AHLIJAH RAIA is a 68 year old male who presents for follow-up of a fungal rash on his feet.  He has experienced significant improvement in his fungal rash on the feet after completing a two-month course of oral terbinafine . The condition, which previously led to cellulitis, has improved according to the patient. He reports that he has been keeping his feet dry and changing socks regularly. There is still a slight white discoloration, but overall, the condition has improved.  He notices a strong smell in his urine at times, despite drinking at least half a gallon of water  daily. His urine sometimes appears dark yellow. He drinks coffee before and after his workouts and uses the sauna daily for 20-25 minutes, which causes significant sweating and occasional lightheadedness.  He is not experiencing any issues with his current medications, including Vraylar , which he finds effective. No chest pain during workouts, but he mentions occasional random pain on the left side of his chest, which he describes as concerning but not associated with exertion.  He reports hearing loss and plans to visit an audiologist in three weeks. He also mentions a recent concern about a possible tick bite, which turned out to be a false alarm, and describes his method of removing ticks when necessary. He has experienced chigger bites and uses alcohol swabs and hydrocortisone for relief.     Objective:     BP 123/65   Pulse 75   Wt 165 lb (74.8 kg)   SpO2 100%   BMI 25.46 kg/m   Physical  Exam  Gen: Well-appearing man Feet: Skin on the right foot looks healthy, no fissuring between the toes, no erythema or cellulitis, nail thickening is much improved, no further signs of tinea on the feet. Heart: Regular, no murmur Lungs: Unlabored, clear throughout    Assessment & Plan:    Problem List Items Addressed This Visit       High   Bipolar depression (HCC) (Chronic)   Chronic and stable.  Managed with a psychiatrist.  Doing well currently on Vraylar  and Lamictal .  No side effects of medications.  No signs of metabolic syndrome.        Medium    Benign essential HTN (Chronic)   Chronic and stable.  Blood pressure well-controlled today.  Currently using only lifestyle interventions including daily exercise.  Will watch blood pressure closely and treat if needed given the comorbid mild ascending aortic aneurysm.        Unprioritized   Tinea pedis - Primary   Significant improvement noted after completing a two-month course of terbinafine . The condition is resolving. Discontinue terbinafine . Keep feet dry and change socks regularly.      Presbycusis   Hearing loss acknowledged. A visit with the audiologist is scheduled in three weeks. Follow up with the audiologist at that time.       Return in about 6 months (around 09/18/2024).    Cleatus Debby Specking, MD

## 2024-03-18 NOTE — Assessment & Plan Note (Signed)
 Significant improvement noted after completing a two-month course of terbinafine . The condition is resolving. Discontinue terbinafine . Keep feet dry and change socks regularly.

## 2024-03-18 NOTE — Assessment & Plan Note (Signed)
 Hearing loss acknowledged. A visit with the audiologist is scheduled in three weeks. Follow up with the audiologist at that time.

## 2024-03-18 NOTE — Patient Instructions (Signed)
  VISIT SUMMARY: You came in today for a follow-up on the fungal rash on your feet. You reported significant improvement after completing a two-month course of oral terbinafine . You also mentioned a strong smell in your urine, occasional lightheadedness, and hearing loss. Additionally, you shared concerns about a possible tick bite and chigger bites.  YOUR PLAN: -TINEA PEDIS AND ONYCHOMYCOSIS: Tinea pedis and onychomycosis are fungal infections of the feet and toenails. Your condition has significantly improved after completing a two-month course of terbinafine . You should discontinue terbinafine  and continue to keep your feet dry and change socks regularly.  -HEARING LOSS: Hearing loss is a reduction in your ability to hear sounds. You have acknowledged this issue and have scheduled a visit with an audiologist in three weeks. Please follow up with the audiologist at that time.  INSTRUCTIONS: Please follow up with the audiologist in three weeks for your hearing loss.

## 2024-03-18 NOTE — Assessment & Plan Note (Signed)
 Chronic and stable.  Blood pressure well-controlled today.  Currently using only lifestyle interventions including daily exercise.  Will watch blood pressure closely and treat if needed given the comorbid mild ascending aortic aneurysm.

## 2024-03-18 NOTE — Assessment & Plan Note (Signed)
 Chronic and stable.  Managed with a psychiatrist.  Doing well currently on Vraylar  and Lamictal .  No side effects of medications.  No signs of metabolic syndrome.

## 2024-03-30 ENCOUNTER — Telehealth: Payer: Self-pay | Admitting: Behavioral Health

## 2024-03-30 NOTE — Telephone Encounter (Signed)
 Patient called in for refill on Adderall  30mg  and Adderall  20mg . Ph: [phone] Appt 2/18 [facility] [address] [place]

## 2024-03-30 NOTE — Telephone Encounter (Signed)
 03/05/2024 02/04/2024 1  Dextroamp-Amphet Er 30 Mg Cap 30.00 30 Br Whi [phone] Har ([id number]) 0/0  Medicaid [place] 03/04/2024 02/04/2024 1  Dextroamp-Amphetamin 20 Mg Tab 30.00  Due 9/5 and 9/6

## 2024-03-31 NOTE — Telephone Encounter (Signed)
 Pt called back at 9:46a about the status of Adderall  refills.  I provided info that the would be available on 9/5 and 9/6

## 2024-04-01 ENCOUNTER — Other Ambulatory Visit: Payer: Self-pay

## 2024-04-01 DIAGNOSIS — F902 Attention-deficit hyperactivity disorder, combined type: Secondary | ICD-10-CM

## 2024-04-01 DIAGNOSIS — F411 Generalized anxiety disorder: Secondary | ICD-10-CM

## 2024-04-01 DIAGNOSIS — F3160 Bipolar disorder, current episode mixed, unspecified: Secondary | ICD-10-CM

## 2024-04-01 DIAGNOSIS — F331 Major depressive disorder, recurrent, moderate: Secondary | ICD-10-CM

## 2024-04-01 MED ORDER — AMPHETAMINE-DEXTROAMPHETAMINE 20 MG PO TABS: 20.0000 mg | ORAL_TABLET | Freq: Every day | ORAL | 0 refills | Status: DC

## 2024-04-01 MED ORDER — AMPHETAMINE-DEXTROAMPHET ER 30 MG PO CP24: ORAL_CAPSULE | ORAL | 0 refills | Status: DC

## 2024-04-01 NOTE — Telephone Encounter (Signed)
 Pended

## 2024-04-04 ENCOUNTER — Ambulatory Visit: Admitting: Orthopaedic Surgery

## 2024-04-27 ENCOUNTER — Telehealth: Payer: Self-pay | Admitting: Behavioral Health

## 2024-04-27 NOTE — Telephone Encounter (Signed)
LF 9/6, due 10/4

## 2024-04-27 NOTE — Telephone Encounter (Signed)
 Patient called in for refill Adderall  20mg . [phone] Appt 2/18 [facility] [address] [place]

## 2024-04-29 ENCOUNTER — Other Ambulatory Visit: Payer: Self-pay

## 2024-04-29 DIAGNOSIS — F902 Attention-deficit hyperactivity disorder, combined type: Secondary | ICD-10-CM

## 2024-04-29 MED ORDER — AMPHETAMINE-DEXTROAMPHETAMINE 20 MG PO TABS: 20.0000 mg | ORAL_TABLET | Freq: Every day | ORAL | 0 refills | Status: AC

## 2024-04-29 MED ORDER — AMPHETAMINE-DEXTROAMPHETAMINE 20 MG PO TABS: 20.0000 mg | ORAL_TABLET | Freq: Every day | ORAL | 0 refills | Status: DC

## 2024-04-29 NOTE — Telephone Encounter (Signed)
 He usually gets both doses filled at the same time. He last filled 30 XR on 9/10, due 10/8. Will send 3 scripts for XR dosing on 10/8.

## 2024-04-29 NOTE — Telephone Encounter (Signed)
 Pt called at 3:34p checking on the status of both Adderall  scripts.  He said they should be sent on the same day.  He is asking for the scripts for XR for this month and to put the future scripts in for the XR for the upcoming 2 months as the Adderall  IR scripts that are already in the system.

## 2024-04-29 NOTE — Telephone Encounter (Signed)
 Pended

## 2024-04-29 NOTE — Telephone Encounter (Signed)
 PT called and said that when he got the 30 mg xr on 9/6. They only gave him 5 pills because they didn't have a 30 day supply. Then he went back and got the rest of his fill when the had it in stock. He wants to get both meds at the same time

## 2024-05-02 ENCOUNTER — Other Ambulatory Visit: Payer: Self-pay

## 2024-05-02 DIAGNOSIS — F331 Major depressive disorder, recurrent, moderate: Secondary | ICD-10-CM

## 2024-05-02 DIAGNOSIS — F411 Generalized anxiety disorder: Secondary | ICD-10-CM

## 2024-05-02 DIAGNOSIS — F3160 Bipolar disorder, current episode mixed, unspecified: Secondary | ICD-10-CM

## 2024-05-02 MED ORDER — AMPHETAMINE-DEXTROAMPHET ER 30 MG PO CP24: ORAL_CAPSULE | ORAL | 0 refills | Status: DC

## 2024-05-02 NOTE — Telephone Encounter (Deleted)
 Pt got a partial fill on 9/7 and the rest filled on 9/10.  Pt is adamant that he get a RF today, but based on the 9/10 he is not due until 10/8. Have tried several times to call pharmacy and unable to reach anyone. LVM to RC.

## 2024-05-02 NOTE — Telephone Encounter (Signed)
 Pt received a partial RF of 30 mg. Pended Rx today.

## 2024-05-02 NOTE — Telephone Encounter (Signed)
 Pt LVM at 9:27a about the Adderall  XR 30mg .  He said he needs to pick it up today with other medications.

## 2024-05-16 ENCOUNTER — Telehealth: Payer: Self-pay | Admitting: Physical Medicine and Rehabilitation

## 2024-05-16 NOTE — Telephone Encounter (Signed)
 Patient called. He would like an injection in his back.

## 2024-05-17 ENCOUNTER — Ambulatory Visit: Admitting: Physical Medicine and Rehabilitation

## 2024-05-17 ENCOUNTER — Encounter: Payer: Self-pay | Admitting: Physical Medicine and Rehabilitation

## 2024-05-17 DIAGNOSIS — M48062 Spinal stenosis, lumbar region with neurogenic claudication: Secondary | ICD-10-CM | POA: Diagnosis not present

## 2024-05-17 DIAGNOSIS — M5116 Intervertebral disc disorders with radiculopathy, lumbar region: Secondary | ICD-10-CM

## 2024-05-17 NOTE — Progress Notes (Unsigned)
 Paul Ray - 68 y.o. male MRN [id number]  Date of birth: 16-Jan-1956  Office Visit Note: Visit Date: 05/17/2024 PCP: Paul Cleatus Ned, MD Referred by: Paul Ray Paul Ray  Subjective: Chief Complaint  Patient presents with   Lower Back - Pain   HPI: Paul Ray is a 68 y.o. male who comes in today for evaluation of acute on chronic lower back pain. He has longstanding history of chronic lower back issues. His pain increased 1 week ago, no specific aggravating factors. He describes pain as sharp and stabbing sensation, currently rates as 9 out of 10. Some relief of pain with home exercise regimen, rest and use of medications. History of physical therapy in the past with some relief of pain. He does exercise regularly at [facility]. Lumbar MRI imaging from 2024 shows right central disc extrusion and caudal migration at L1-L2, there is severe spinal canal stenosis at this level. Also severe spinal canal stenosis at L2-L3. Prior L3-L4 and L4-L5 decompressive laminectomy with Paul Ray in 2012. He underwent bilateral L1 transforaminal epidural steroid injection in our office on 05/14/2023, he reports greater than 80% relief of pain for about 10 months. He has seen Paul Ray in the past, there was some talk about proceeding with surgery, however patient improved significantly with physical therapy and continued injection therapy. Patient denies recent trauma or falls.   Patients course is complicated by bipolar disorder and attention deficit disorder.      Review of Systems  Musculoskeletal:  Positive for back pain.  Neurological:  Negative for tingling, sensory change, focal weakness and weakness.  All other systems reviewed and are negative.  Otherwise per HPI.  Assessment & Plan: Visit Diagnoses:    ICD-10-CM   1. Spinal stenosis of lumbar region with neurogenic claudication  M48.062 Ambulatory referral to Physical Medicine Rehab    2. Radiculopathy due to lumbar intervertebral disc  disorder  M51.16 Ambulatory referral to Physical Medicine Rehab       Plan: Findings:  Acute on chronic lower back pain. Patient continues to have severe pain despite good conservative therapies such as formal physical therapy, home exercise regimen, rest and use of medications. There is disc herniation at L1-L2 along with severe spinal canal stenosis, also severe spinal canal stenosis at L2-L3. I do think his lower back pain could be from central canal stenosis. Good relief with prior bilateral transforaminal epidural steroid injections in the past. We discussed treatment plan in detail today. Next step is to perform diagnostic and hopefully therapeutic bilateral L1 transforaminal epidural steroid injection under fluoroscopic guidance. If good relief of pain with injection we can repeat this procedure infrequently as needed. Can also follow up with Paul Ray as needed. No focal weakness noted today, good strength to bilateral lower extremities. No myelopathic symptoms noted. We will see him back for injection.     Meds & Orders: No orders of the defined types were placed in this encounter.   Orders Placed This Encounter  Procedures   Ambulatory referral to Physical Medicine Rehab    Follow-up: Return for Bilateral L1 transforaminal epidural steroid injection.   Procedures: No procedures performed      Clinical History: MRI LUMBAR SPINE WITHOUT CONTRAST   TECHNIQUE: Multiplanar, multisequence MR imaging of the lumbar spine was performed. No intravenous contrast was administered.   COMPARISON:  Lumbar spine MRI 07/14/2021.   FINDINGS: Segmentation: Conventional numbering is assumed with 5 non-rib-bearing, lumbar type vertebral bodies.   Alignment: 3 mm anterolisthesis  of L4 on L5. Trace retrolisthesis of L1 on L2.   Vertebrae: Modic type 1 degenerative endplate marrow signal changes at L1-2.   Conus medullaris and cauda equina: Conus extends to the L1 level. Conus and cauda  equina appear normal.   Paraspinal and other soft tissues: Unremarkable.   Disc levels:   Congenitally short pedicles throughout the lumbar spine.   T12-L1: Small disc bulge without spinal canal stenosis or neural foraminal narrowing.   L1-L2: Worsening disc bulge with new superimposed right central disc extrusion caudal migration measuring a proximally 14 mm (sagittal image 7 series 3), results in severe spinal canal stenosis with superimposed compression of the traversing nerve roots in the right lateral recess. Facet arthropathy contributes to mild bilateral neural foraminal narrowing, unchanged.   L2-L3: Slightly increased disc bulge and facet arthropathy results in severe spinal canal stenosis, worse from prior, and mild bilateral neural foraminal narrowing, unchanged.   L3-L4: Prior right hemilaminotomy. Interval resolution of previously seen central disc extrusion. Residual disc bulge contributes to moderate spinal canal stenosis, improved from prior. Bilateral facet arthropathy contributes to mild bilateral neural foraminal narrowing, unchanged.   L4-L5: Disc bulge and facet arthropathy results in mild spinal canal stenosis and mild bilateral neural foraminal narrowing, unchanged.   L5-S1: Small disc bulge without spinal canal stenosis. Bilateral facet arthropathy results in moderate left and mild right neural foraminal narrowing, unchanged.   IMPRESSION: 1. Worsening degenerative disc disease at L1-L2 with a new right central disc extrusion and caudal migration, resulting in severe spinal canal stenosis with superimposed compression of the traversing nerve roots in the right lateral recess. 2. Slightly increased disc bulge at L2-L3 results in severe spinal canal stenosis, worse from prior. 3. Prior right hemilaminotomy at L3-L4. Interval resolution of previously seen central disc extrusion. Residual disc bulge contributes to moderate spinal canal stenosis,  improved from prior.     Electronically Signed   By: Paul Ray M.D.   On: 10/01/2022 14:24   He reports that he has never smoked. He has never used smokeless tobacco.  Recent Labs    12/17/23 0909  HGBA1C 5.5    Objective:  VS:  HT:    WT:   BMI:     BP:   HR: bpm  TEMP: ( )  RESP:  Physical Exam Vitals and nursing note reviewed.  HENT:     Head: Normocephalic and atraumatic.     Right Ear: External ear normal.     Left Ear: External ear normal.     Nose: Nose normal.  Eyes:     Extraocular Movements: Extraocular movements intact.  Cardiovascular:     Rate and Rhythm: Normal rate.     Pulses: Normal pulses.  Pulmonary:     Effort: Pulmonary effort is normal.  Abdominal:     General: Abdomen is flat. There is no distension.  Musculoskeletal:        General: Tenderness present.     Cervical back: Normal range of motion.     Comments: Patient rises from seated position to standing without difficulty. Good lumbar range of motion. No pain noted with facet loading. 5/5 strength noted with bilateral hip flexion, knee flexion/extension, ankle dorsiflexion/plantarflexion and EHL. No clonus noted bilaterally. No pain upon palpation of greater trochanters. No pain with internal/external rotation of bilateral hips. Sensation intact bilaterally. Negative slump test bilaterally. Ambulates without aid, gait steady.     Skin:    General: Skin is warm and dry.  Capillary Refill: Capillary refill takes less than 2 seconds.  Neurological:     General: No focal deficit present.     Mental Status: He is alert and oriented to person, place, and time.  Psychiatric:        Mood and Affect: Mood normal.        Behavior: Behavior normal.     Ortho Exam  Imaging: No results found.  Past Medical/Family/Surgical/Social History: Medications & Allergies reviewed per EMR, new medications updated. Patient Active Problem List   Diagnosis Date Noted   Tinea pedis 12/17/2023    Cerumen impaction 12/17/2023   Presbycusis 12/17/2023   Foraminal stenosis of cervical region 03/08/2021   Ascending aortic aneurysm    Benign essential HTN    Chronic right shoulder pain 05/04/2017   Osteoarthritis of right hip 12/02/2016   Chronic low back pain 08/16/2013   Bipolar depression (HCC)    ADD (attention deficit disorder)    Past Medical History:  Diagnosis Date   Abscess of lung (HCC)    Right lower lobe  IMO SNOMED Dx Update Oct 2024     Abscess of lung(513.0)    Right lower lobe   ADD (attention deficit disorder)    Anxiety    Arthritis    Ascending aortic aneurysm    47mm by Chest CTA 05/2020 and 48mm by echo 05/2020   Benign essential HTN    Bipolar depression (HCC)    Coronary artery calcification seen on CAT scan    Empyema lung (HCC)    Hemorrhoids    Hepatitis C    History of lumbar laminectomy for spinal cord decompression 01/08/2022   History of total hip arthroplasty 04/23/2011   Pneumonia    last year   Prostate abscess    Staph infection    Viral pericarditis    after COVID 19 infection   Family History  Problem Relation Age of Onset   COPD Mother    Heart disease Mother    Hypertension Mother    Coronary artery disease Father    Dementia Father    Heart failure Father    Schizophrenia Brother    Breast cancer Maternal Grandmother    Prostate cancer Paternal Grandfather        also had bone cancer   Cancer Paternal Grandmother        unsure what kind   Past Surgical History:  Procedure Laterality Date   ELBOW SURGERY  2005   Right   FOOT NEUROMA SURGERY  2006   Left   HIP RESURFACING     left hip at [facility] 01/2011   LUMBAR LAMINECTOMY/DECOMPRESSION MICRODISCECTOMY  07/24/2011   Procedure: LUMBAR LAMINECTOMY/DECOMPRESSION MICRODISCECTOMY;  Surgeon: Alm GORMAN Molt;  Location: [facility];  Service: Neurosurgery;  Laterality: Bilateral;  Bilateral  , Lumbar Three-Four, Lumbar Four-Five Decompressive Laminectomy Rm # [id number]   LUNG  SURGERY     for empyema   SHOULDER ARTHROSCOPY Right 07/05/2013   Procedure: RIGHT SHOULDER ARTHROSCOPY WITH DEBRIDEMENT;  Surgeon: Lonni CINDERELLA Poli, MD;  Location: [facility] OR;  Service: Orthopedics;  Laterality: Right;   TENDON TRANSFER Left 01/26/2014   Procedure: LEFT THUMB TRAPEZIECTOMY KNOTTED TENDON INTRAPOSITION TRANSFER OF ABDUCTOR POLLICIS LONGUS TO THENARS;  Surgeon: Lamar Leonor Mickey LULLA, MD;  Location: [facility];  Service: Orthopedics;  Laterality: Left;   THORACOTOMY  November 14 2010   rt   Social History   Occupational History   Occupation: Music therapist  Tobacco Use  Smoking status: Never   Smokeless tobacco: Never  Vaping Use   Vaping status: Never Used  Substance and Sexual Activity   Alcohol use: No    Comment: quit drinking in 1989   Drug use: Not Currently    Types: Hydrocodone     Comment: Prior heavy use of opioid pain medications up until 2 years ago   Sexual activity: Yes

## 2024-05-17 NOTE — Progress Notes (Unsigned)
 Pain Scale   Average Pain 9 Patient advising he has lower back pain that is constant.        +Driver, -BT, -Dye Allergies.

## 2024-05-18 ENCOUNTER — Telehealth: Payer: Self-pay | Admitting: Physical Medicine and Rehabilitation

## 2024-05-18 ENCOUNTER — Other Ambulatory Visit: Payer: Self-pay | Admitting: Physical Medicine and Rehabilitation

## 2024-05-18 DIAGNOSIS — M5416 Radiculopathy, lumbar region: Secondary | ICD-10-CM

## 2024-05-18 MED ORDER — OXYCODONE-ACETAMINOPHEN 5-325 MG PO TABS: 1.0000 | ORAL_TABLET | Freq: Three times a day (TID) | ORAL | 0 refills | Status: DC | PRN

## 2024-05-18 NOTE — Telephone Encounter (Signed)
 Patient called and said he wants to talk to you about the back injection. CB#[phone]

## 2024-05-24 ENCOUNTER — Other Ambulatory Visit: Payer: Self-pay | Admitting: Physical Medicine and Rehabilitation

## 2024-05-24 ENCOUNTER — Telehealth: Payer: Self-pay | Admitting: Physical Medicine and Rehabilitation

## 2024-05-24 MED ORDER — OXYCODONE-ACETAMINOPHEN 5-325 MG PO TABS: 1.0000 | ORAL_TABLET | Freq: Three times a day (TID) | ORAL | 0 refills | Status: DC | PRN

## 2024-05-24 NOTE — Telephone Encounter (Signed)
 Pt called asking for refill for pain meds and aslo asking for sooner appt if anyone cancelled. Please call pt about this matter at. [phone]. Pt pharmacy is [address]

## 2024-05-27 ENCOUNTER — Telehealth: Payer: Self-pay | Admitting: Behavioral Health

## 2024-05-27 NOTE — Telephone Encounter (Signed)
 05/02/2024 05/02/2024 1  Dextroamp-Amphet Er 30 Mg Cap 30.00 30 Br Whi [phone] Har (1569) 0/0  Medicaid [place] 05/02/2024 04/29/2024 1  Dextroamp-Amphetamin 20 Mg Tab 30.00 30 Br Whi

## 2024-05-27 NOTE — Telephone Encounter (Signed)
 LF 10/6, due 11/3. Has a RF already at pharmacy for 20 mg dosing.

## 2024-05-27 NOTE — Telephone Encounter (Signed)
 Pt called requesting Adderall  XR 30 mg and Adderall  IR 20 mg to HT.

## 2024-05-30 ENCOUNTER — Other Ambulatory Visit: Payer: Self-pay

## 2024-05-30 ENCOUNTER — Telehealth: Payer: Self-pay | Admitting: Physical Medicine and Rehabilitation

## 2024-05-30 ENCOUNTER — Encounter: Payer: Self-pay | Admitting: Radiology

## 2024-05-30 ENCOUNTER — Telehealth: Payer: Self-pay | Admitting: Orthopaedic Surgery

## 2024-05-30 DIAGNOSIS — F411 Generalized anxiety disorder: Secondary | ICD-10-CM

## 2024-05-30 DIAGNOSIS — M25551 Pain in right hip: Secondary | ICD-10-CM

## 2024-05-30 DIAGNOSIS — F331 Major depressive disorder, recurrent, moderate: Secondary | ICD-10-CM

## 2024-05-30 DIAGNOSIS — F3160 Bipolar disorder, current episode mixed, unspecified: Secondary | ICD-10-CM

## 2024-05-30 MED ORDER — AMPHETAMINE-DEXTROAMPHET ER 30 MG PO CP24: ORAL_CAPSULE | ORAL | 0 refills | Status: AC

## 2024-05-30 MED ORDER — AMPHETAMINE-DEXTROAMPHET ER 30 MG PO CP24: ORAL_CAPSULE | ORAL | 0 refills | Status: DC

## 2024-05-30 NOTE — Telephone Encounter (Signed)
 Patient called and said he needs PT. He has a lot of pain in R hip. CB#[phone]

## 2024-05-30 NOTE — Telephone Encounter (Signed)
 Pended

## 2024-06-01 ENCOUNTER — Telehealth: Payer: Self-pay | Admitting: Physical Medicine and Rehabilitation

## 2024-06-01 ENCOUNTER — Other Ambulatory Visit: Payer: Self-pay | Admitting: Physical Medicine and Rehabilitation

## 2024-06-01 MED ORDER — OXYCODONE-ACETAMINOPHEN 5-325 MG PO TABS: 1.0000 | ORAL_TABLET | Freq: Three times a day (TID) | ORAL | 0 refills | Status: DC | PRN

## 2024-06-01 NOTE — Telephone Encounter (Signed)
 Pt called asking for a refill of Perkaset. Pharmacy is [facility] [address] Pt is also stating that rt hip is bothering him. Pt call back number is [phone]

## 2024-06-03 ENCOUNTER — Ambulatory Visit (HOSPITAL_COMMUNITY)
Admission: RE | Admit: 2024-06-03 | Discharge: 2024-06-03 | Disposition: A | Discharge status: Home / Self Care | Source: Ambulatory Visit | Attending: Internal Medicine | Admitting: Internal Medicine

## 2024-06-03 DIAGNOSIS — Q2381 Bicuspid aortic valve: Secondary | ICD-10-CM | POA: Diagnosis present

## 2024-06-03 DIAGNOSIS — I7121 Aneurysm of the ascending aorta, without rupture: Secondary | ICD-10-CM | POA: Insufficient documentation

## 2024-06-03 MED ORDER — IOHEXOL 350 MG/ML SOLN
75.0000 mL | Freq: Once | INTRAVENOUS | Status: AC | PRN
  Administered 2024-06-03: 75 mL via INTRAVENOUS

## 2024-06-07 ENCOUNTER — Other Ambulatory Visit: Payer: Self-pay | Admitting: Physical Medicine and Rehabilitation

## 2024-06-07 ENCOUNTER — Telehealth: Payer: Self-pay | Admitting: Physical Medicine and Rehabilitation

## 2024-06-07 MED ORDER — OXYCODONE-ACETAMINOPHEN 5-325 MG PO TABS: 1.0000 | ORAL_TABLET | Freq: Three times a day (TID) | ORAL | 0 refills | Status: DC | PRN

## 2024-06-07 NOTE — Telephone Encounter (Signed)
 Patient called and needs a refill on pain medication. He needs it to go to [facility] on [address] [phone]

## 2024-06-08 ENCOUNTER — Ambulatory Visit: Payer: Self-pay | Admitting: Internal Medicine

## 2024-06-13 ENCOUNTER — Ambulatory Visit: Admitting: Physical Medicine and Rehabilitation

## 2024-06-13 ENCOUNTER — Other Ambulatory Visit: Payer: Self-pay

## 2024-06-13 ENCOUNTER — Ambulatory Visit: Admitting: Orthopaedic Surgery

## 2024-06-13 VITALS — BP 153/78 | HR 71

## 2024-06-13 DIAGNOSIS — M5416 Radiculopathy, lumbar region: Secondary | ICD-10-CM

## 2024-06-13 MED ORDER — DEXAMETHASONE SOD PHOSPHATE PF 10 MG/ML IJ SOLN
15.0000 mg | Freq: Once | INTRAMUSCULAR | Status: AC
  Administered 2024-06-13: 15 mg

## 2024-06-13 NOTE — Progress Notes (Signed)
 Pain Scale   Average Pain 9 Patient advising he has chronic lower back pain radiating to right hip and leg. Patient advising pain is constant.        +Driver, -BT, -Dye Allergies.

## 2024-06-15 ENCOUNTER — Other Ambulatory Visit: Payer: Self-pay | Admitting: Physical Medicine and Rehabilitation

## 2024-06-15 ENCOUNTER — Telehealth: Payer: Self-pay | Admitting: Physical Medicine and Rehabilitation

## 2024-06-15 DIAGNOSIS — M5416 Radiculopathy, lumbar region: Secondary | ICD-10-CM

## 2024-06-15 MED ORDER — OXYCODONE-ACETAMINOPHEN 5-325 MG PO TABS: 1.0000 | ORAL_TABLET | Freq: Three times a day (TID) | ORAL | 0 refills | Status: DC | PRN

## 2024-06-15 NOTE — Telephone Encounter (Signed)
Patient called. He would like a refill on his pain medication.  °

## 2024-06-19 NOTE — Procedures (Signed)
 Lumbosacral Transforaminal Epidural Steroid Injection - Sub-Pedicular Approach with Fluoroscopic Guidance  Patient: Paul Ray      Date of Birth: 1956-05-30 MRN: [id number] PCP: Jerrell Cleatus Ned, MD      Visit Date: 06/13/2024   Universal Protocol:    Date/Time: 06/13/2024  Consent Given By: the patient  Position: PRONE  Additional Comments: Vital signs were monitored before and after the procedure. Patient was prepped and draped in the usual sterile fashion. The correct patient, procedure, and site was verified.   Injection Procedure Details:   Procedure diagnoses: Lumbar radiculopathy [M54.16]    Meds Administered:  Meds ordered this encounter  Medications   dexamethasone  (DECADRON ) injection 15 mg    Laterality: Bilateral  Location/Site: L1  Needle:5.0 in., 22 ga.  Short bevel or Quincke spinal needle  Needle Placement: Transforaminal  Findings:    -Comments: Excellent flow of contrast along the nerve, nerve root and into the epidural space.  Procedure Details: After squaring off the end-plates to get a true AP view, the C-arm was positioned so that an oblique view of the foramen as noted above was visualized. The target area is just inferior to the nose of the scotty dog or sub pedicular. The soft tissues overlying this structure were infiltrated with 2-3 ml. of 1% Lidocaine  without Epinephrine .  The spinal needle was inserted toward the target using a trajectory view along the fluoroscope beam.  Under AP and lateral visualization, the needle was advanced so it did not puncture dura and was located close the 6 O'Clock position of the pedical in AP tracterory. Biplanar projections were used to confirm position. Aspiration was confirmed to be negative for CSF and/or blood. A 1-2 ml. volume of Isovue -250 was injected and flow of contrast was noted at each level. Radiographs were obtained for documentation purposes.   After attaining the desired flow of  contrast documented above, a 0.5 to 1.0 ml test dose of 0.25% Marcaine  was injected into each respective transforaminal space.  The patient was observed for 90 seconds post injection.  After no sensory deficits were reported, and normal lower extremity motor function was noted,   the above injectate was administered so that equal amounts of the injectate were placed at each foramen (level) into the transforaminal epidural space.   Additional Comments:  The patient tolerated the procedure well Dressing: 2 x 2 sterile gauze and Band-Aid    Post-procedure details: Patient was observed during the procedure. Post-procedure instructions were reviewed.  Patient left the clinic in stable condition.

## 2024-06-19 NOTE — Progress Notes (Signed)
 Paul Ray - 68 y.o. male MRN [id number]  Date of birth: 1955/11/10  Office Visit Note: Visit Date: 06/13/2024 PCP: Jerrell Cleatus Ned, MD Referred by: Jerrell Cleatus Ned Paul Ray  Subjective: Chief Complaint  Patient presents with   Lower Back - Pain   HPI:  Paul Ray is a 68 y.o. male who comes in today at the request of Duwaine Pouch, FNP and Dr. Ozell Ada for planned Bilateral L1-2 Lumbar Transforaminal epidural steroid injection with fluoroscopic guidance.  The patient has failed conservative care including home exercise, medications, time and activity modification.  This injection will be diagnostic and hopefully therapeutic.  Please see requesting physician notes for further details and justification.   ROS Otherwise per HPI.  Assessment & Plan: Visit Diagnoses:    ICD-10-CM   1. Lumbar radiculopathy  M54.16 XR C-ARM NO REPORT    Epidural Steroid injection    dexamethasone  (DECADRON ) injection 15 mg      Plan: No additional findings.   Meds & Orders:  Meds ordered this encounter  Medications   dexamethasone  (DECADRON ) injection 15 mg    Orders Placed This Encounter  Procedures   XR C-ARM NO REPORT   Epidural Steroid injection    Follow-up: Return for visit to requesting provider as needed.   Procedures: No procedures performed  Lumbosacral Transforaminal Epidural Steroid Injection - Sub-Pedicular Approach with Fluoroscopic Guidance  Patient: Paul Ray      Date of Birth: 08/07/1955 MRN: [id number] PCP: Jerrell Cleatus Ned, MD      Visit Date: 06/13/2024   Universal Protocol:    Date/Time: 06/13/2024  Consent Given By: the patient  Position: PRONE  Additional Comments: Vital signs were monitored before and after the procedure. Patient was prepped and draped in the usual sterile fashion. The correct patient, procedure, and site was verified.   Injection Procedure Details:   Procedure diagnoses: Lumbar radiculopathy [M54.16]    Meds  Administered:  Meds ordered this encounter  Medications   dexamethasone  (DECADRON ) injection 15 mg    Laterality: Bilateral  Location/Site: L1  Needle:5.0 in., 22 ga.  Short bevel or Quincke spinal needle  Needle Placement: Transforaminal  Findings:    -Comments: Excellent flow of contrast along the nerve, nerve root and into the epidural space.  Procedure Details: After squaring off the end-plates to get a true AP view, the C-arm was positioned so that an oblique view of the foramen as noted above was visualized. The target area is just inferior to the nose of the scotty dog or sub pedicular. The soft tissues overlying this structure were infiltrated with 2-3 ml. of 1% Lidocaine  without Epinephrine .  The spinal needle was inserted toward the target using a trajectory view along the fluoroscope beam.  Under AP and lateral visualization, the needle was advanced so it did not puncture dura and was located close the 6 O'Clock position of the pedical in AP tracterory. Biplanar projections were used to confirm position. Aspiration was confirmed to be negative for CSF and/or blood. A 1-2 ml. volume of Isovue -250 was injected and flow of contrast was noted at each level. Radiographs were obtained for documentation purposes.   After attaining the desired flow of contrast documented above, a 0.5 to 1.0 ml test dose of 0.25% Marcaine  was injected into each respective transforaminal space.  The patient was observed for 90 seconds post injection.  After no sensory deficits were reported, and normal lower extremity motor function was noted,   the above injectate  was administered so that equal amounts of the injectate were placed at each foramen (level) into the transforaminal epidural space.   Additional Comments:  The patient tolerated the procedure well Dressing: 2 x 2 sterile gauze and Band-Aid    Post-procedure details: Patient was observed during the procedure. Post-procedure  instructions were reviewed.  Patient left the clinic in stable condition.    Clinical History: MRI LUMBAR SPINE WITHOUT CONTRAST   TECHNIQUE: Multiplanar, multisequence MR imaging of the lumbar spine was performed. No intravenous contrast was administered.   COMPARISON:  Lumbar spine MRI 07/14/2021.   FINDINGS: Segmentation: Conventional numbering is assumed with 5 non-rib-bearing, lumbar type vertebral bodies.   Alignment: 3 mm anterolisthesis of L4 on L5. Trace retrolisthesis of L1 on L2.   Vertebrae: Modic type 1 degenerative endplate marrow signal changes at L1-2.   Conus medullaris and cauda equina: Conus extends to the L1 level. Conus and cauda equina appear normal.   Paraspinal and other soft tissues: Unremarkable.   Disc levels:   Congenitally short pedicles throughout the lumbar spine.   T12-L1: Small disc bulge without spinal canal stenosis or neural foraminal narrowing.   L1-L2: Worsening disc bulge with new superimposed right central disc extrusion caudal migration measuring a proximally 14 mm (sagittal image 7 series 3), results in severe spinal canal stenosis with superimposed compression of the traversing nerve roots in the right lateral recess. Facet arthropathy contributes to mild bilateral neural foraminal narrowing, unchanged.   L2-L3: Slightly increased disc bulge and facet arthropathy results in severe spinal canal stenosis, worse from prior, and mild bilateral neural foraminal narrowing, unchanged.   L3-L4: Prior right hemilaminotomy. Interval resolution of previously seen central disc extrusion. Residual disc bulge contributes to moderate spinal canal stenosis, improved from prior. Bilateral facet arthropathy contributes to mild bilateral neural foraminal narrowing, unchanged.   L4-L5: Disc bulge and facet arthropathy results in mild spinal canal stenosis and mild bilateral neural foraminal narrowing, unchanged.   L5-S1: Small disc bulge  without spinal canal stenosis. Bilateral facet arthropathy results in moderate left and mild right neural foraminal narrowing, unchanged.   IMPRESSION: 1. Worsening degenerative disc disease at L1-L2 with a new right central disc extrusion and caudal migration, resulting in severe spinal canal stenosis with superimposed compression of the traversing nerve roots in the right lateral recess. 2. Slightly increased disc bulge at L2-L3 results in severe spinal canal stenosis, worse from prior. 3. Prior right hemilaminotomy at L3-L4. Interval resolution of previously seen central disc extrusion. Residual disc bulge contributes to moderate spinal canal stenosis, improved from prior.     Electronically Signed   By: Ryan Chess M.D.   On: 10/01/2022 14:24     Objective:  VS:  HT:    WT:   BMI:     BP:(!) 153/78  HR:71bpm  TEMP: ( )  RESP:  Physical Exam   Imaging: No results found.

## 2024-06-21 ENCOUNTER — Telehealth: Payer: Self-pay | Admitting: Physical Medicine and Rehabilitation

## 2024-06-21 NOTE — Telephone Encounter (Signed)
 Pt called requesting refill of oxycodone . Pt states pains have radiated to his hip and he can barely walk. Pt has an upcoming appt. Please send meds to [facility]. Pt phone number is [phone].

## 2024-06-22 ENCOUNTER — Ambulatory Visit: Admitting: Orthopaedic Surgery

## 2024-06-22 ENCOUNTER — Other Ambulatory Visit: Payer: Self-pay | Admitting: Physical Medicine and Rehabilitation

## 2024-06-22 MED ORDER — OXYCODONE-ACETAMINOPHEN 5-325 MG PO TABS: 1.0000 | ORAL_TABLET | Freq: Three times a day (TID) | ORAL | 0 refills | Status: DC | PRN

## 2024-06-28 ENCOUNTER — Telehealth: Payer: Self-pay | Admitting: Behavioral Health

## 2024-06-28 ENCOUNTER — Telehealth: Payer: Self-pay | Admitting: Physical Medicine and Rehabilitation

## 2024-06-28 NOTE — Telephone Encounter (Signed)
 Pt has scripts for both doses. I called pharmacy. The issue for him is that he has a script for XR30 dose for next RF, but doesn't have a script for next fill of 20 mg dose. He will get both doses filled tomorrow and a script for the 20 mg can be sent when due. Pharmacy said they would notify him when RF available.

## 2024-06-28 NOTE — Telephone Encounter (Signed)
 Patient lvm at 10:28 requesting refill for Adderall  30mg  and Adderall  20mg . Ph: [phone] Appt 2/18 [facility]acy [facility] [address] [place], [place]

## 2024-06-28 NOTE — Telephone Encounter (Signed)
 Pt called wanting his [place]kiest refilled. [facility] [address] Call back number is [phone]

## 2024-06-29 ENCOUNTER — Other Ambulatory Visit: Payer: Self-pay

## 2024-06-29 ENCOUNTER — Ambulatory Visit: Admitting: Orthopaedic Surgery

## 2024-06-29 DIAGNOSIS — M25551 Pain in right hip: Secondary | ICD-10-CM

## 2024-06-29 NOTE — Progress Notes (Signed)
 Polk is a 68 year old active gentleman well-known to us .  He has a history of bilateral hip resurfacing arthroplasties that were done elsewhere.  His left hip was resurfaced 13 years ago and his right hip was resurfaced 7 years ago.  These were both done by a physician who has since retired.  He has no issues with his left hip but his right hip has been hurting somewhat in the groin but also on the lateral aspect of the right hip.  This has been slowly getting worse.  He is a very active individual and is not obese.  On exam both hips move smoothly and fluidly.  He does have a little bit of pain in the groin and around the psoas tendon on the right hip but more pain to palpation over the trochanteric area as well.  An AP pelvis of the right hip shows bilateral hip resurfacing arthroplasties with no complicating features or gross findings of instability or loosening.  There is some heterotopic bone more on the left than the right.  He is someone who has done well with physical therapy in the past so it is worth at least a round of physical therapy to see if there is any modalities that the therapist can try including even dry needling around the right hip area including the proximal IT band and right trochanteric area and even the iliopsoas tendon.  Another option would be eventually sending him to Dr. Burnetta for considering an injection under ultrasound of the right hip.  Kemani agrees with this treatment plan.  Will work on setting him up for outpatient physical therapy and then see him back in about 6 weeks.

## 2024-06-30 ENCOUNTER — Other Ambulatory Visit: Payer: Self-pay

## 2024-06-30 DIAGNOSIS — M25551 Pain in right hip: Secondary | ICD-10-CM

## 2024-07-01 ENCOUNTER — Encounter: Payer: Self-pay | Admitting: Rehabilitative and Restorative Service Providers"

## 2024-07-01 ENCOUNTER — Ambulatory Visit: Admitting: Rehabilitative and Restorative Service Providers"

## 2024-07-01 DIAGNOSIS — M25651 Stiffness of right hip, not elsewhere classified: Secondary | ICD-10-CM

## 2024-07-01 DIAGNOSIS — R262 Difficulty in walking, not elsewhere classified: Secondary | ICD-10-CM

## 2024-07-01 DIAGNOSIS — M6281 Muscle weakness (generalized): Secondary | ICD-10-CM | POA: Diagnosis not present

## 2024-07-01 DIAGNOSIS — M25652 Stiffness of left hip, not elsewhere classified: Secondary | ICD-10-CM | POA: Diagnosis not present

## 2024-07-01 DIAGNOSIS — M25552 Pain in left hip: Secondary | ICD-10-CM

## 2024-07-01 DIAGNOSIS — M25551 Pain in right hip: Secondary | ICD-10-CM | POA: Diagnosis not present

## 2024-07-01 NOTE — Therapy (Signed)
 OUTPATIENT PHYSICAL THERAPY LOWER EXTREMITY EVALUATION   Patient Name: Paul Ray MRN: [id number] DOB:10-Jan-1956, 68 y.o., male Today's Date: 07/01/2024  END OF SESSION:  PT End of Session - 07/01/24 1034     Visit Number 1    Number of Visits 12    Date for Recertification  09/23/24    Progress Note Due on Visit 12    PT Start Time 0930    PT Stop Time 1026    PT Time Calculation (min) 56 min    Activity Tolerance Patient tolerated treatment well;No increased pain;Patient limited by pain    Behavior During Therapy [place] for tasks assessed/performed          Past Medical History:  Diagnosis Date   Abscess of lung (HCC)    Right lower lobe  IMO SNOMED Dx Update Oct 2024     Abscess of lung(513.0)    Right lower lobe   ADD (attention deficit disorder)    Anxiety    Arthritis    Ascending aortic aneurysm    47mm by Chest CTA 05/2020 and 48mm by echo 05/2020   Benign essential HTN    Bipolar depression (HCC)    Coronary artery calcification seen on CAT scan    Empyema lung (HCC)    Hemorrhoids    Hepatitis C    History of lumbar laminectomy for spinal cord decompression 01/08/2022   History of total hip arthroplasty 04/23/2011   Pneumonia    last year   Prostate abscess    Staph infection    Viral pericarditis    after COVID 19 infection   Past Surgical History:  Procedure Laterality Date   ELBOW SURGERY  2005   Right   FOOT NEUROMA SURGERY  2006   Left   HIP RESURFACING     left hip [facility] 01/2011   LUMBAR LAMINECTOMY/DECOMPRESSION MICRODISCECTOMY  07/24/2011   Procedure: LUMBAR LAMINECTOMY/DECOMPRESSION MICRODISCECTOMY;  Surgeon: Alm GORMAN Molt;  Location: [facility];  Service: Neurosurgery;  Laterality: Bilateral;  Bilateral  , Lumbar Three-Four, Lumbar Four-Five Decompressive Laminectomy Rm # [id number]   LUNG SURGERY     for empyema   SHOULDER ARTHROSCOPY Right 07/05/2013   Procedure: RIGHT SHOULDER ARTHROSCOPY WITH DEBRIDEMENT;  Surgeon: Lonni CINDERELLA Poli, MD;  Location[facility];  Service: Orthopedics;  Laterality: Right;   TENDON TRANSFER Left 01/26/2014   Procedure: LEFT THUMB TRAPEZIECTOMY KNOTTED TENDON INTRAPOSITION TRANSFER OF ABDUCTOR POLLICIS LONGUS TO THENARS;  Surgeon: Lamar Leonor Mickey LULLA, MD;  Location: [facility];  Service: Orthopedics;  Laterality: Left;   THORACOTOMY  November 14 2010   rt   Patient Active Problem List   Diagnosis Date Noted   Tinea pedis 12/17/2023   Cerumen impaction 12/17/2023   Presbycusis 12/17/2023   Foraminal stenosis of cervical region 03/08/2021   Ascending aortic aneurysm    Benign essential HTN    Chronic right shoulder pain 05/04/2017   Osteoarthritis of right hip 12/02/2016   Chronic low back pain 08/16/2013   Bipolar depression (HCC)    ADD (attention deficit disorder)     PCP: Cleatus Debby Specking, MD  REFERRING PROVIDER: Lonni CINDERELLA Poli, MD  REFERRING DIAG:  [phone] (ICD-10-CM) - Bilateral hip pain    THERAPY DIAG:  Difficulty in walking, not elsewhere classified  Muscle weakness (generalized)  Bilateral hip pain  Stiffness of left hip, not elsewhere classified  Stiffness of right hip, not elsewhere classified  Rationale for Evaluation and Treatment: Rehabilitation  ONSET DATE: 6 weeks ago, gradual onset  SUBJECTIVE:   SUBJECTIVE STATEMENT: Paul Ray notes his Lt hip was addressed 13 years ago and his Rt hip 7 years ago.  He currently has right > left groin pain with sneezing, walking, exercise, basketball and pickle ball.  He now just rides the bike at the [facility].  PERTINENT HISTORY: Previous laminectomy/discectomy, right shoulder arthroscopy, bilat hip resurfacing/replacements   PAIN:  Are you having pain? Yes: NPRS scale: 3-10/10 but no acute distress Pain location: Rt > Lt hip/groin Pain description: Sharp/stabbing Aggravating factors: Basketball, Pickleball Relieving factors: Pain meds, sitting (off the feet)  PRECAUTIONS: Other: Be  aware of very arthritic left shoulder and multiple low back surgeries  RED FLAGS: None   WEIGHT BEARING RESTRICTIONS: No  FALLS:  Has patient fallen in last 6 months? No  LIVING ENVIRONMENT: Lives with: lives alone Lives in: House/apartment Stairs: Does them, but some limitations Has following equipment at home: None  OCCUPATION: Retired  PLOF: Independent  PATIENT GOALS: Return to the gym, get back to his normal PT exercises, pickle ball, basketball  NEXT MD VISIT: 08/10/2024  OBJECTIVE:  Note: Objective measures were completed at Evaluation unless otherwise noted.  DIAGNOSTIC FINDINGS: See chart  PATIENT SURVEYS:  PSFS: THE PATIENT SPECIFIC FUNCTIONAL SCALE  Place score of 0-10 (0 = unable to perform activity and 10 = able to perform activity at the same level as before injury or problem)  Activity Date: 07/01/2024    Gym activities 2    2.   Walking  2    3.   Basketball 0    4.   Pickle ball 0    Total Score 1      Total Score = Sum of activity scores/number of activities  Minimally Detectable Change: 3 points (for single activity); 2 points (for average score)  [facility] (nd). The Patient Specific Functional Scale . Retrieved from [email or web address].com.[email or web address]   COGNITION: Overall cognitive status: Within functional limits for tasks assessed     SENSATION: No new complaints of peripheral pain or paresthesias  MUSCLE LENGTH: Hamstrings: Right 45 deg; Left 35 deg  LOWER EXTREMITY ROM:  Passive ROM Left/Right 07/01/2024   Hip flexion 70/90   Hip extension    Hip abduction    Hip adduction    Hip internal rotation 3/2   Hip external rotation 28/23   Knee flexion    Knee extension    Ankle dorsiflexion    Ankle plantarflexion    Ankle inversion    Ankle eversion     (Blank rows = not tested)  LOWER EXTREMITY MMT:  MMT Left/Right 07/01/2024   Hip flexion 5/5   Hip extension    Hip  abduction 3+/3   Hip adduction    Hip internal rotation    Hip external rotation    Knee flexion    Knee extension 5/5   Ankle dorsiflexion    Ankle plantarflexion    Ankle inversion    Ankle eversion     (Blank rows = not tested)  GAIT: Distance walked: 50 feet Assistive device utilized: None Level of assistance: Complete Independence Comments: Paul Ray's weight-bearing endurance is very poor with groin pain that increases rapidly with weight-bearing  TREATMENT DATE:  07/01/2024  Single knee to chest stretch 4 x 20 seconds Supine hamstrings stretch 4 x 20 seconds Figure 4 (push) stretch 4 x 20 seconds Side-lie clams with Black Thera-band 10 x 3 seconds, slow eccentrics Side-lie hip abduction (1/4 turn to stomach, stay straight from shoulder to hip to knee) 10 x 3 seconds Hip hike in door frame 5 x 3 seconds  02464: Review hip anatomy, exam findings and day 1 HEP   PATIENT EDUCATION:  Education details: See above Person educated: Patient Education method: Explanation, Demonstration, Tactile cues, Verbal cues, and Handouts Education comprehension: verbalized understanding, returned demonstration, verbal cues required, tactile cues required, and needs further education  HOME EXERCISE PROGRAM: Access Code: [email or web address] URL: [email or web address] Date: 07/01/2024 Prepared by: Lamar Ray  Exercises - Single Knee to Chest Stretch  - 1-2 x daily - 7 x weekly - 1 sets - 5 reps - 10-15 seconds hold - Supine Hamstring Stretch  - 1-2 x daily - 7 x weekly - 1 sets - 5 reps - 10-15 seconds hold - Supine Figure 4 Piriformis Stretch  - 1-2 x daily - 7 x weekly - 1 sets - 5 reps - 10-15 seconds hold - Clamshell with Resistance  - 1-2 x daily - 7 x weekly - 2-3 sets - 10 reps - 3 seconds hold - Sidelying Hip Abduction  - 1-2 x daily - 7 x weekly - 2-3  sets - 10 reps - 3 seconds hold - Standing Hip Hiking  - 5 x daily - 7 x weekly - 1 sets - 5 reps - 3 seconds hold  ASSESSMENT:  CLINICAL IMPRESSION: Patient is a 68 y.o. male who was seen today for physical therapy evaluation and treatment for  M25.551,M25.552 (ICD-10-CM) - Bilateral hip pain  .  Paul Ray has hip active range of motion (AROM), flexibility, strength and functional impairments in need of skilled care.  OBJECTIVE IMPAIRMENTS: Abnormal gait, decreased activity tolerance, decreased endurance, decreased knowledge of condition, difficulty walking, decreased ROM, decreased strength, impaired perceived functional ability, impaired flexibility, and pain.   ACTIVITY LIMITATIONS: carrying, lifting, bending, standing, squatting, stairs, dressing, and locomotion level  PARTICIPATION LIMITATIONS: driving, community activity, and the gym  PERSONAL FACTORS: Previous laminectomy/discectomy, right shoulder arthroscopy, bilat hip resurfacing/replacements are also affecting patient's functional outcome.   REHAB POTENTIAL: Good  CLINICAL DECISION MAKING: Evolving/moderate complexity  EVALUATION COMPLEXITY: Moderate   GOALS: Goals reviewed with patient? Yes  SHORT TERM GOALS: Target date: 08/05/2024 Paul Ray will be independent with his day 1 HEP Baseline: Started 07/01/2024 Goal status: INITIAL  2.  Improve hip flexion to 90; hamstrings flexibility to 45 and hip ER to 30 degrees Baseline: 70/90; 35/45 and 28/23 respectively Goal status: INITIAL  3.  Paul Ray will have improved hip abductors strength as assessed by MMT Baseline: 3+/3 Goal status: INITIAL   LONG TERM GOALS: Target date: 09/23/2024  Improve Patient Specific Functional Score to 5 or better Baseline: 1 Goal status: INITIAL  2.  Improve bilateral groin/hip pain to consistently 0-3/10 Baseline: 3-10/10 Goal status: INITIAL  3.  Improve bilateral hip abductors strength to 4/4 MMT Baseline: 3+/3 MMT Goal status:  INITIAL  4.  Paul Ray will be able to return to doing his previous PT exercises and walking 1/2 mile or greater without increasing groin/hip pain Baseline: Unable at evaluation Goal status: INITIAL  5.  Paul Ray will be independent with his long-term maintenance HEP at [place] Baseline: Started 07/01/2024 Goal status: INITIAL   PLAN:  PT FREQUENCY:  1x/week  PT DURATION: 12 weeks  PLANNED INTERVENTIONS: 97750- Physical Performance Testing, 97110-Therapeutic exercises, 97530- Therapeutic activity, [id number]- Neuromuscular re-education, 97535- Self Care, 02859- Manual therapy, [id number]- Gait training, [phone] (1-2 muscles), [id number] (3+ muscles)- Dry Needling, Patient/Family education, Balance training, Stair training, Cryotherapy, and Moist heat  PLAN FOR NEXT SESSION: Hip abductors strength emphasis while avoiding overuse.  AROM and flexibility as prescribed.   Paul Ray, PT, MPT 07/01/2024, 11:00 AM

## 2024-07-04 ENCOUNTER — Encounter

## 2024-07-04 NOTE — Therapy (Incomplete)
 OUTPATIENT PHYSICAL THERAPY LOWER EXTREMITY TREATMENT   Patient Name: Paul Ray MRN: [id number] DOB:[birth year], 68 y.o., male Today's Date: 07/04/2024  END OF SESSION:    Past Medical History:  Diagnosis Date   Abscess of lung (HCC)    Right lower lobe  IMO SNOMED Dx Update Oct 2024     Abscess of lung(513.0)    Right lower lobe   ADD (attention deficit disorder)    Anxiety    Arthritis    Ascending aortic aneurysm    47mm by Chest CTA 05/2020 and 48mm by echo 05/2020   Benign essential HTN    Bipolar depression (HCC)    Coronary artery calcification seen on CAT scan    Empyema lung (HCC)    Hemorrhoids    Hepatitis C    History of lumbar laminectomy for spinal cord decompression 01/08/2022   History of total hip arthroplasty 04/23/2011   Pneumonia    last year   Prostate abscess    Staph infection    Viral pericarditis    after COVID 19 infection   Past Surgical History:  Procedure Laterality Date   ELBOW SURGERY  2005   Right   FOOT NEUROMA SURGERY  2006   Left   HIP RESURFACING     left hip at [facility] 01/2011   LUMBAR LAMINECTOMY/DECOMPRESSION MICRODISCECTOMY  07/24/2011   Procedure: LUMBAR LAMINECTOMY/DECOMPRESSION MICRODISCECTOMY;  Surgeon: Alm GORMAN Molt;  Location: [facility];  Service: Neurosurgery;  Laterality: Bilateral;  Bilateral  , Lumbar Three-Four, Lumbar Four-Five Decompressive Laminectomy Rm # 32   LUNG SURGERY     for empyema   SHOULDER ARTHROSCOPY Right 07/05/2013   Procedure: RIGHT SHOULDER ARTHROSCOPY WITH DEBRIDEMENT;  Surgeon: Lonni CINDERELLA Poli, MD;  Location: [facility] OR;  Service: Orthopedics;  Laterality: Right;   TENDON TRANSFER Left 01/26/2014   Procedure: LEFT THUMB TRAPEZIECTOMY KNOTTED TENDON INTRAPOSITION TRANSFER OF ABDUCTOR POLLICIS LONGUS TO THENARS;  Surgeon: Lamar Leonor Mickey LULLA, MD;  Location: [facility];  Service: Orthopedics;  Laterality: Left;   THORACOTOMY  November 14 2010   rt   Patient Active Problem List    Diagnosis Date Noted   Tinea pedis 12/17/2023   Cerumen impaction 12/17/2023   Presbycusis 12/17/2023   Foraminal stenosis of cervical region 03/08/2021   Ascending aortic aneurysm    Benign essential HTN    Chronic right shoulder pain 05/04/2017   Osteoarthritis of right hip 12/02/2016   Chronic low back pain 08/16/2013   Bipolar depression (HCC)    ADD (attention deficit disorder)     PCP: Cleatus Debby Specking, MD  REFERRING PROVIDER: Lonni CINDERELLA Poli, MD  REFERRING DIAG:  [phone] (ICD-10-CM) - Bilateral hip pain    THERAPY DIAG:  No diagnosis found.  Rationale for Evaluation and Treatment: Rehabilitation  ONSET DATE: 6 weeks ago, gradual onset  SUBJECTIVE:   SUBJECTIVE STATEMENT: *** Paul Ray notes his Lt hip was addressed 13 years ago and his Rt hip 7 years ago.  He currently has right > left groin pain with sneezing, walking, exercise, basketball and pickle ball.  He now just rides the bike at the [facility].  PERTINENT HISTORY: Previous laminectomy/discectomy, right shoulder arthroscopy, bilat hip resurfacing/replacements   PAIN:  Are you having pain? Yes: NPRS scale: 3-10/10 but no acute distress Pain location: Rt > Lt hip/groin Pain description: Sharp/stabbing Aggravating factors: Basketball, Pickleball Relieving factors: Pain meds, sitting (off the feet)  PRECAUTIONS: Other: Be aware of very arthritic left shoulder and multiple low  back surgeries  RED FLAGS: None   WEIGHT BEARING RESTRICTIONS: No  FALLS:  Has patient fallen in last 6 months? No  LIVING ENVIRONMENT: Lives with: lives alone Lives in: House/apartment Stairs: Does them, but some limitations Has following equipment at home: None  OCCUPATION: Retired  PLOF: Independent  PATIENT GOALS: Return to the gym, get back to his normal PT exercises, pickle ball, basketball  NEXT MD VISIT: 08/10/2024  OBJECTIVE:  Note: Objective measures were completed at Evaluation unless otherwise  noted.  DIAGNOSTIC FINDINGS: See chart  PATIENT SURVEYS:  PSFS: THE PATIENT SPECIFIC FUNCTIONAL SCALE  Place score of 0-10 (0 = unable to perform activity and 10 = able to perform activity at the same level as before injury or problem)  Activity Date: 07/01/2024    Gym activities 2    2.   Walking  2    3.   Basketball 0    4.   Pickle ball 0    Total Score 1      Total Score = Sum of activity scores/number of activities  Minimally Detectable Change: 3 points (for single activity); 2 points (for average score)  [facility] (nd). The Patient Specific Functional Scale . Retrieved from [email or web address]   COGNITION: Overall cognitive status: Within functional limits for tasks assessed     SENSATION: No new complaints of peripheral pain or paresthesias  MUSCLE LENGTH: Hamstrings: Right 45 deg; Left 35 deg  LOWER EXTREMITY ROM:  Passive ROM Left/Right 07/01/2024   Hip flexion 70/90   Hip extension    Hip abduction    Hip adduction    Hip internal rotation 3/2   Hip external rotation 28/23   Knee flexion    Knee extension    Ankle dorsiflexion    Ankle plantarflexion    Ankle inversion    Ankle eversion     (Blank rows = not tested)  LOWER EXTREMITY MMT:  MMT Left/Right 07/01/2024   Hip flexion 5/5   Hip extension    Hip abduction 3+/3   Hip adduction    Hip internal rotation    Hip external rotation    Knee flexion    Knee extension 5/5   Ankle dorsiflexion    Ankle plantarflexion    Ankle inversion    Ankle eversion     (Blank rows = not tested)  GAIT: Distance walked: 50 feet Assistive device utilized: None Level of assistance: Complete Independence Comments: Paul Ray's weight-bearing endurance is very poor with groin pain that increases rapidly with weight-bearing                                                                                                                              TREATMENT DATE:  07/05/2024 ***  07/01/2024  Single knee to chest stretch 4 x 20 seconds Supine hamstrings stretch 4 x 20 seconds Figure 4 (push) stretch 4 x 20 seconds Side-lie clams with Black Thera-band 10 x  3 seconds, slow eccentrics Side-lie hip abduction (1/4 turn to stomach, stay straight from shoulder to hip to knee) 10 x 3 seconds Hip hike in door frame 5 x 3 seconds  02464: Review hip anatomy, exam findings and day 1 HEP   PATIENT EDUCATION:  Education details: See above Person educated: Patient Education method: Explanation, Demonstration, Tactile cues, Verbal cues, and Handouts Education comprehension: verbalized understanding, returned demonstration, verbal cues required, tactile cues required, and needs further education  HOME EXERCISE PROGRAM: Access Code: [email or web address] URL: [email or web address] Date: 07/01/2024 Prepared by: Lamar Ivory  Exercises - Single Knee to Chest Stretch  - 1-2 x daily - 7 x weekly - 1 sets - 5 reps - 10-15 seconds hold - Supine Hamstring Stretch  - 1-2 x daily - 7 x weekly - 1 sets - 5 reps - 10-15 seconds hold - Supine Figure 4 Piriformis Stretch  - 1-2 x daily - 7 x weekly - 1 sets - 5 reps - 10-15 seconds hold - Clamshell with Resistance  - 1-2 x daily - 7 x weekly - 2-3 sets - 10 reps - 3 seconds hold - Sidelying Hip Abduction  - 1-2 x daily - 7 x weekly - 2-3 sets - 10 reps - 3 seconds hold - Standing Hip Hiking  - 5 x daily - 7 x weekly - 1 sets - 5 reps - 3 seconds hold  ASSESSMENT:  CLINICAL IMPRESSION: *** Patient is a 68 y.o. male who was seen today for physical therapy evaluation and treatment for  M25.551,M25.552 (ICD-10-CM) - Bilateral hip pain  .  Eero has hip active range of motion (AROM), flexibility, strength and functional impairments in need of skilled care.  OBJECTIVE IMPAIRMENTS: Abnormal gait, decreased activity tolerance, decreased endurance, decreased knowledge of condition, difficulty walking,  decreased ROM, decreased strength, impaired perceived functional ability, impaired flexibility, and pain.   ACTIVITY LIMITATIONS: carrying, lifting, bending, standing, squatting, stairs, dressing, and locomotion level  PARTICIPATION LIMITATIONS: driving, community activity, and the gym  PERSONAL FACTORS: Previous laminectomy/discectomy, right shoulder arthroscopy, bilat hip resurfacing/replacements are also affecting patient's functional outcome.   REHAB POTENTIAL: Good  CLINICAL DECISION MAKING: Evolving/moderate complexity  EVALUATION COMPLEXITY: Moderate   GOALS: Goals reviewed with patient? Yes  SHORT TERM GOALS: Target date: 08/05/2024 Vinod will be independent with his day 1 HEP Baseline: Started 07/01/2024 Goal status: INITIAL  2.  Improve hip flexion to 90; hamstrings flexibility to 45 and hip ER to 30 degrees Baseline: 70/90; 35/45 and 28/23 respectively Goal status: INITIAL  3.  Garlin will have improved hip abductors strength as assessed by MMT Baseline: 3+/3 Goal status: INITIAL   LONG TERM GOALS: Target date: 09/23/2024  Improve Patient Specific Functional Score to 5 or better Baseline: 1 Goal status: INITIAL  2.  Improve bilateral groin/hip pain to consistently 0-3/10 Baseline: 3-10/10 Goal status: INITIAL  3.  Improve bilateral hip abductors strength to 4/4 MMT Baseline: 3+/3 MMT Goal status: INITIAL  4.  Doc will be able to return to doing his previous PT exercises and walking 1/2 mile or greater without increasing groin/hip pain Baseline: Unable at evaluation Goal status: INITIAL  5.  Dickey will be independent with his long-term maintenance HEP at DC Baseline: Started 07/01/2024 Goal status: INITIAL   PLAN:  PT FREQUENCY: 1x/week  PT DURATION: 12 weeks  PLANNED INTERVENTIONS: 97750- Physical Performance Testing, 97110-Therapeutic exercises, 97530- Therapeutic activity, [id number]- Neuromuscular re-education, 97535- Self Care, 02859- Manual therapy,  [id number]- Gait training, [phone] (1-2 muscles), 20561 (3+  muscles)- Dry Needling, Patient/Family education, Balance training, Stair training, Cryotherapy, and Moist heat  PLAN FOR NEXT SESSION: *** Hip abductors strength emphasis while avoiding overuse.  AROM and flexibility as prescribed.   Susannah Daring, PT, DPT 07/04/24 8:11 AM

## 2024-07-05 ENCOUNTER — Ambulatory Visit

## 2024-07-05 DIAGNOSIS — M25551 Pain in right hip: Secondary | ICD-10-CM

## 2024-07-05 DIAGNOSIS — M6281 Muscle weakness (generalized): Secondary | ICD-10-CM

## 2024-07-05 DIAGNOSIS — M25652 Stiffness of left hip, not elsewhere classified: Secondary | ICD-10-CM

## 2024-07-05 DIAGNOSIS — R262 Difficulty in walking, not elsewhere classified: Secondary | ICD-10-CM

## 2024-07-05 DIAGNOSIS — M25651 Stiffness of right hip, not elsewhere classified: Secondary | ICD-10-CM

## 2024-07-05 DIAGNOSIS — M5459 Other low back pain: Secondary | ICD-10-CM

## 2024-07-05 NOTE — Therapy (Signed)
 OUTPATIENT PHYSICAL THERAPY LOWER EXTREMITY  TREATMENT   Patient Name: Paul Ray MRN: [id number] DOB:July 17, 1956, 68 y.o., male Today's Date: 07/05/2024  END OF SESSION:  PT End of Session - 07/05/24 1102     Visit Number 2    Date for Recertification  09/23/24    PT Start Time 1055    PT Stop Time 1135    PT Time Calculation (min) 40 min    Activity Tolerance Patient tolerated treatment well    Behavior During Therapy [facility] for tasks assessed/performed           Past Medical History:  Diagnosis Date   Abscess of lung (HCC)    Right lower lobe  IMO SNOMED Dx Update Oct 2024     Abscess of lung(513.0)    Right lower lobe   ADD (attention deficit disorder)    Anxiety    Arthritis    Ascending aortic aneurysm    47mm by Chest CTA 05/2020 and 48mm by echo 05/2020   Benign essential HTN    Bipolar depression (HCC)    Coronary artery calcification seen on CAT scan    Empyema lung (HCC)    Hemorrhoids    Hepatitis C    History of lumbar laminectomy for spinal cord decompression 01/08/2022   History of total hip arthroplasty 04/23/2011   Pneumonia    last year   Prostate abscess    Staph infection    Viral pericarditis    after COVID 19 infection   Past Surgical History:  Procedure Laterality Date   ELBOW SURGERY  2005   Right   FOOT NEUROMA SURGERY  2006   Left   HIP RESURFACING     left hip at [facility] 01/2011   LUMBAR LAMINECTOMY/DECOMPRESSION MICRODISCECTOMY  07/24/2011   Procedure: LUMBAR LAMINECTOMY/DECOMPRESSION MICRODISCECTOMY;  Surgeon: Alm GORMAN Molt;  Location: [facility];  Service: Neurosurgery;  Laterality: Bilateral;  Bilateral  , Lumbar Three-Four, Lumbar Four-Five Decompressive Laminectomy Rm # 32   LUNG SURGERY     for empyema   SHOULDER ARTHROSCOPY Right 07/05/2013   Procedure: RIGHT SHOULDER ARTHROSCOPY WITH DEBRIDEMENT;  Surgeon: Lonni CINDERELLA Poli, MD;  Location: [facility] OR;  Service: Orthopedics;  Laterality: Right;   TENDON TRANSFER Left  01/26/2014   Procedure: LEFT THUMB TRAPEZIECTOMY KNOTTED TENDON INTRAPOSITION TRANSFER OF ABDUCTOR POLLICIS LONGUS TO THENARS;  Surgeon: Lamar Leonor Mickey LULLA, MD;  Location: [facility];  Service: Orthopedics;  Laterality: Left;   THORACOTOMY  November 14 2010   rt   Patient Active Problem List   Diagnosis Date Noted   Tinea pedis 12/17/2023   Cerumen impaction 12/17/2023   Presbycusis 12/17/2023   Foraminal stenosis of cervical region 03/08/2021   Ascending aortic aneurysm    Benign essential HTN    Chronic right shoulder pain 05/04/2017   Osteoarthritis of right hip 12/02/2016   Chronic low back pain 08/16/2013   Bipolar depression (HCC)    ADD (attention deficit disorder)     PCP: Cleatus Debby Specking, MD  REFERRING PROVIDER: Lonni CINDERELLA Poli, MD  REFERRING DIAG:  [phone] (ICD-10-CM) - Bilateral hip pain    THERAPY DIAG:  Difficulty in walking, not elsewhere classified  Muscle weakness (generalized)  Bilateral hip pain  Stiffness of left hip, not elsewhere classified  Stiffness of right hip, not elsewhere classified  Other low back pain  Rationale for Evaluation and Treatment: Rehabilitation  ONSET DATE: 6 weeks ago, gradual onset  SUBJECTIVE:   SUBJECTIVE STATEMENT: Alm  states he woke up with increased stiffness in hips but worked some of it out at [employer].  R hip hurts worse.    PERTINENT HISTORY: Previous laminectomy/discectomy, right shoulder arthroscopy, bilat hip resurfacing/replacements   PAIN:  Are you having pain? Yes: NPRS scale: 9/10  Pain location: Rt > Lt hip/groin Pain description: Sharp/stabbing Aggravating factors: Basketball, Pickleball Relieving factors: Pain meds, sitting (off the feet)  PRECAUTIONS: Other: Be aware of very arthritic left shoulder and multiple low back surgeries  RED FLAGS: None   WEIGHT BEARING RESTRICTIONS: No  FALLS:  Has patient fallen in last 6 months? No  LIVING ENVIRONMENT: Lives  with: lives alone Lives in: House/apartment Stairs: Does them, but some limitations Has following equipment at home: None  OCCUPATION: Retired  PLOF: Independent  PATIENT GOALS: Return to the gym, get back to his normal PT exercises, pickle ball, basketball  NEXT MD VISIT: 08/10/2024  OBJECTIVE:  Note: Objective measures were completed at Evaluation unless otherwise noted.  DIAGNOSTIC FINDINGS: See chart  PATIENT SURVEYS:  PSFS: THE PATIENT SPECIFIC FUNCTIONAL SCALE  Place score of 0-10 (0 = unable to perform activity and 10 = able to perform activity at the same level as before injury or problem)  Activity Date: 07/01/2024    Gym activities 2    2.   Walking  2    3.   Basketball 0    4.   Pickle ball 0    Total Score 1      Total Score = Sum of activity scores/number of activities  Minimally Detectable Change: 3 points (for single activity); 2 points (for average score)  [facility] (nd). The Patient Specific Functional Scale . Retrieved from [email or web address]   COGNITION: Overall cognitive status: Within functional limits for tasks assessed     SENSATION: No new complaints of peripheral pain or paresthesias  MUSCLE LENGTH: Hamstrings: Right 45 deg; Left 35 deg  LOWER EXTREMITY ROM:  Passive ROM Left/Right 07/01/2024   Hip flexion 70/90   Hip extension    Hip abduction    Hip adduction    Hip internal rotation 3/2   Hip external rotation 28/23   Knee flexion    Knee extension    Ankle dorsiflexion    Ankle plantarflexion    Ankle inversion    Ankle eversion     (Blank rows = not tested)  LOWER EXTREMITY MMT:  MMT Left/Right 07/01/2024   Hip flexion 5/5   Hip extension    Hip abduction 3+/3   Hip adduction    Hip internal rotation    Hip external rotation    Knee flexion    Knee extension 5/5   Ankle dorsiflexion    Ankle plantarflexion    Ankle inversion    Ankle eversion      (Blank rows = not tested)  GAIT: Distance walked: 50 feet Assistive device utilized: None Level of assistance: Complete Independence Comments: Gorje's weight-bearing endurance is very poor with groin pain that increases rapidly with weight-bearing  TREATMENT DATE:  07/05/24 Hip hike in door frame 10 x 3 seconds Single knee to chest stretch 4 x 20 seconds Side-lie clams with Black Thera-band 2x 10 x 3 seconds, slow eccentrics Side-lie hip abduction 2x10 each side  Supine hamstrings stretch 3 x 25 seconds Figure 4 (push) stretch 3 x 25 seconds Manual Abductor and hip flexor stretch 07/01/2024  Single knee to chest stretch 4 x 20 seconds Supine hamstrings stretch 4 x 20 seconds Figure 4 (push) stretch 4 x 20 seconds Side-lie clams with Black Thera-band 10 x 3 seconds, slow eccentrics Side-lie hip abduction (1/4 turn to stomach, stay straight from shoulder to hip to knee) 10 x 3 seconds Hip hike in door frame 5 x 3 seconds  02464: Review hip anatomy, exam findings and day 1 HEP   PATIENT EDUCATION:  Education details: See above Person educated: Patient Education method: Explanation, Demonstration, Tactile cues, Verbal cues, and Handouts Education comprehension: verbalized understanding, returned demonstration, verbal cues required, tactile cues required, and needs further education  HOME EXERCISE PROGRAM: Access Code: [email or web address] URL: [email or web address] Date: 07/01/2024 Prepared by: Lamar Ivory  Exercises - Single Knee to Chest Stretch  - 1-2 x daily - 7 x weekly - 1 sets - 5 reps - 10-15 seconds hold - Supine Hamstring Stretch  - 1-2 x daily - 7 x weekly - 1 sets - 5 reps - 10-15 seconds hold - Supine Figure 4 Piriformis Stretch  - 1-2 x daily - 7 x weekly - 1 sets - 5 reps - 10-15 seconds hold - Clamshell with Resistance  - 1-2 x daily -  7 x weekly - 2-3 sets - 10 reps - 3 seconds hold - Sidelying Hip Abduction  - 1-2 x daily - 7 x weekly - 2-3 sets - 10 reps - 3 seconds hold - Standing Hip Hiking  - 5 x daily - 7 x weekly - 1 sets - 5 reps - 3 seconds hold  ASSESSMENT:  CLINICAL IMPRESSION: Patient needed VC for review of HEP.  Demonstrated understanding.  Gave patient black T band for HEP.    OBJECTIVE IMPAIRMENTS: Abnormal gait, decreased activity tolerance, decreased endurance, decreased knowledge of condition, difficulty walking, decreased ROM, decreased strength, impaired perceived functional ability, impaired flexibility, and pain.   ACTIVITY LIMITATIONS: carrying, lifting, bending, standing, squatting, stairs, dressing, and locomotion level  PARTICIPATION LIMITATIONS: driving, community activity, and the gym  PERSONAL FACTORS: Previous laminectomy/discectomy, right shoulder arthroscopy, bilat hip resurfacing/replacements are also affecting patient's functional outcome.   REHAB POTENTIAL: Good  CLINICAL DECISION MAKING: Evolving/moderate complexity  EVALUATION COMPLEXITY: Moderate   GOALS: Goals reviewed with patient? Yes  SHORT TERM GOALS: Target date: 08/05/2024 Magdiel will be independent with his day 1 HEP Baseline: Started 07/01/2024 Goal status: INITIAL  2.  Improve hip flexion to 90; hamstrings flexibility to 45 and hip ER to 30 degrees Baseline: 70/90; 35/45 and 28/23 respectively Goal status: INITIAL  3.  Daily will have improved hip abductors strength as assessed by MMT Baseline: 3+/3 Goal status: INITIAL   LONG TERM GOALS: Target date: 09/23/2024  Improve Patient Specific Functional Score to 5 or better Baseline: 1 Goal status: INITIAL  2.  Improve bilateral groin/hip pain to consistently 0-3/10 Baseline: 3-10/10 Goal status: INITIAL  3.  Improve bilateral hip abductors strength to 4/4 MMT Baseline: 3+/3 MMT Goal status: INITIAL  4.  Issiac will be able to return to doing his previous  PT exercises and walking 1/2 mile or greater without increasing groin/hip  pain Baseline: Unable at evaluation Goal status: INITIAL  5.  Awab will be independent with his long-term maintenance HEP at DC Baseline: Started 07/01/2024 Goal status: INITIAL   PLAN:  PT FREQUENCY: 1x/week  PT DURATION: 12 weeks  PLANNED INTERVENTIONS: 97750- Physical Performance Testing, 97110-Therapeutic exercises, 97530- Therapeutic activity, [id number]- Neuromuscular re-education, 97535- Self Care, 02859- Manual therapy, [id number]- Gait training, [phone] (1-2 muscles), 20561 (3+ muscles)- Dry Needling, Patient/Family education, Balance training, Stair training, Cryotherapy, and Moist heat  PLAN FOR NEXT SESSION: Hip abductors strength emphasis while avoiding overuse.  AROM and flexibility as prescribed.   Burnard CHRISTELLA Meth, PT,  07/05/2024, 11:46 AM

## 2024-07-07 ENCOUNTER — Telehealth: Payer: Self-pay | Admitting: Orthopaedic Surgery

## 2024-07-07 ENCOUNTER — Ambulatory Visit: Admitting: Orthopaedic Surgery

## 2024-07-07 ENCOUNTER — Other Ambulatory Visit: Payer: Self-pay

## 2024-07-07 DIAGNOSIS — Z96641 Presence of right artificial hip joint: Secondary | ICD-10-CM

## 2024-07-07 NOTE — Telephone Encounter (Signed)
 LMOM for patient that we were sending referral for both bone scan and an MRI

## 2024-07-07 NOTE — Telephone Encounter (Signed)
 Pt request to have a mri done. Pt states he is hurting more since starting PT and would like to do the mri in December.

## 2024-07-08 ENCOUNTER — Encounter (HOSPITAL_COMMUNITY)
Admission: RE | Admit: 2024-07-08 | Discharge: 2024-07-08 | Disposition: A | Source: Ambulatory Visit | Attending: Orthopaedic Surgery | Admitting: Orthopaedic Surgery

## 2024-07-08 DIAGNOSIS — Z96641 Presence of right artificial hip joint: Secondary | ICD-10-CM

## 2024-07-08 MED ORDER — TECHNETIUM TC 99M MEDRONATE IV KIT
20.0000 | PACK | Freq: Once | INTRAVENOUS | Status: AC | PRN
  Administered 2024-07-08: 20 via INTRAVENOUS

## 2024-07-14 ENCOUNTER — Ambulatory Visit: Admitting: Rehabilitative and Restorative Service Providers"

## 2024-07-14 ENCOUNTER — Encounter: Payer: Self-pay | Admitting: Rehabilitative and Restorative Service Providers"

## 2024-07-14 ENCOUNTER — Inpatient Hospital Stay
Admission: RE | Admit: 2024-07-14 | Discharge: 2024-07-14 | Attending: Orthopaedic Surgery | Admitting: Orthopaedic Surgery

## 2024-07-14 DIAGNOSIS — M25551 Pain in right hip: Secondary | ICD-10-CM | POA: Diagnosis not present

## 2024-07-14 DIAGNOSIS — M6281 Muscle weakness (generalized): Secondary | ICD-10-CM

## 2024-07-14 DIAGNOSIS — R262 Difficulty in walking, not elsewhere classified: Secondary | ICD-10-CM

## 2024-07-14 DIAGNOSIS — M25651 Stiffness of right hip, not elsewhere classified: Secondary | ICD-10-CM | POA: Diagnosis not present

## 2024-07-14 DIAGNOSIS — M25552 Pain in left hip: Secondary | ICD-10-CM | POA: Diagnosis not present

## 2024-07-14 DIAGNOSIS — Z96641 Presence of right artificial hip joint: Secondary | ICD-10-CM

## 2024-07-14 DIAGNOSIS — M5459 Other low back pain: Secondary | ICD-10-CM

## 2024-07-14 DIAGNOSIS — M25652 Stiffness of left hip, not elsewhere classified: Secondary | ICD-10-CM

## 2024-07-14 NOTE — Therapy (Signed)
 OUTPATIENT PHYSICAL THERAPY LOWER EXTREMITY  TREATMENT   Patient Name: Paul Ray MRN: [id number] DOB:29-Apr-1956, 68 y.o., male Today's Date: 07/14/2024  END OF SESSION:  PT End of Session - 07/14/24 1429     Visit Number 3    Number of Visits 12    Date for Recertification  09/23/24    PT Start Time 1429    PT Stop Time 1514    PT Time Calculation (min) 45 min    Activity Tolerance Patient tolerated treatment well;No increased pain;Patient limited by pain    Behavior During Therapy [facility] for tasks assessed/performed            Past Medical History:  Diagnosis Date   Abscess of lung (HCC)    Right lower lobe  IMO SNOMED Dx Update Oct 2024     Abscess of lung(513.0)    Right lower lobe   ADD (attention deficit disorder)    Anxiety    Arthritis    Ascending aortic aneurysm    47mm by Chest CTA 05/2020 and 48mm by echo 05/2020   Benign essential HTN    Bipolar depression (HCC)    Coronary artery calcification seen on CAT scan    Empyema lung (HCC)    Hemorrhoids    Hepatitis C    History of lumbar laminectomy for spinal cord decompression 01/08/2022   History of total hip arthroplasty 04/23/2011   Pneumonia    last year   Prostate abscess    Staph infection    Viral pericarditis    after COVID 19 infection   Past Surgical History:  Procedure Laterality Date   ELBOW SURGERY  2005   Right   FOOT NEUROMA SURGERY  2006   Left   HIP RESURFACING     left hip at [facility] 01/2011   LUMBAR LAMINECTOMY/DECOMPRESSION MICRODISCECTOMY  07/24/2011   Procedure: LUMBAR LAMINECTOMY/DECOMPRESSION MICRODISCECTOMY;  Surgeon: Alm GORMAN Molt;  Location: [facility];  Service: Neurosurgery;  Laterality: Bilateral;  Bilateral  , Lumbar Three-Four, Lumbar Four-Five Decompressive Laminectomy Rm # 32   LUNG SURGERY     for empyema   SHOULDER ARTHROSCOPY Right 07/05/2013   Procedure: RIGHT SHOULDER ARTHROSCOPY WITH DEBRIDEMENT;  Surgeon: Lonni CINDERELLA Poli, MD;  Location: [facility];   Service: Orthopedics;  Laterality: Right;   TENDON TRANSFER Left 01/26/2014   Procedure: LEFT THUMB TRAPEZIECTOMY KNOTTED TENDON INTRAPOSITION TRANSFER OF ABDUCTOR POLLICIS LONGUS TO THENARS;  Surgeon: Lamar Leonor Mickey LULLA, MD;  Location: [facility];  Service: Orthopedics;  Laterality: Left;   THORACOTOMY  November 14 2010   rt   Patient Active Problem List   Diagnosis Date Noted   Tinea pedis 12/17/2023   Cerumen impaction 12/17/2023   Presbycusis 12/17/2023   Foraminal stenosis of cervical region 03/08/2021   Ascending aortic aneurysm    Benign essential HTN    Chronic right shoulder pain 05/04/2017   Osteoarthritis of right hip 12/02/2016   Chronic low back pain 08/16/2013   Bipolar depression (HCC)    ADD (attention deficit disorder)     PCP: Cleatus Debby Specking, MD  REFERRING PROVIDER: Lonni CINDERELLA Poli, MD  REFERRING DIAG:  [phone] (ICD-10-CM) - Bilateral hip pain    THERAPY DIAG:  Difficulty in walking, not elsewhere classified  Muscle weakness (generalized)  Bilateral hip pain  Stiffness of left hip, not elsewhere classified  Stiffness of right hip, not elsewhere classified  Other low back pain  Rationale for Evaluation and Treatment: Rehabilitation  ONSET  DATE: 6 weeks ago, gradual onset  SUBJECTIVE:   SUBJECTIVE STATEMENT: [facility]  Stretches are 1 x a day 1.  R hip pain is affecting his everyday function in a significant way.  PERTINENT HISTORY: Previous laminectomy/discectomy, right shoulder arthroscopy, bilat hip resurfacing/replacements   PAIN:  Are you having pain? Yes: NPRS scale: As high as 9/10 this week  Pain location: Rt > Lt hip/groin Pain description: Sharp/stabbing Aggravating factors: Basketball, Pickleball Relieving factors: Pain meds, sitting (off the feet)  PRECAUTIONS: Other: Be aware of very arthritic left shoulder and multiple low back surgeries  RED FLAGS: None   WEIGHT BEARING  RESTRICTIONS: No  FALLS:  Has patient fallen in last 6 months? No  LIVING ENVIRONMENT: Lives with: lives alone Lives in: House/apartment Stairs: Does them, but some limitations Has following equipment at home: None  OCCUPATION: Retired  PLOF: Independent  PATIENT GOALS: Return to the gym, get back to his normal PT exercises, pickle ball, basketball  NEXT MD VISIT: 08/10/2024  OBJECTIVE:  Note: Objective measures were completed at Evaluation unless otherwise noted.  DIAGNOSTIC FINDINGS: See chart  PATIENT SURVEYS:  PSFS: THE PATIENT SPECIFIC FUNCTIONAL SCALE  Place score of 0-10 (0 = unable to perform activity and 10 = able to perform activity at the same level as before injury or problem)  Activity Date: 07/01/2024    Gym activities 2    2.   Walking  2    3.   Basketball 0    4.   Pickle ball 0    Total Score 1      Total Score = Sum of activity scores/number of activities  Minimally Detectable Change: 3 points (for single activity); 2 points (for average score)  [facility] (nd). The Patient Specific Functional Scale . Retrieved from [email or web address].[email or web address]   COGNITION: Overall cognitive status: Within functional limits for tasks assessed     SENSATION: No new complaints of peripheral pain or paresthesias  MUSCLE LENGTH: Hamstrings: Right 45 deg; Left 35 deg  LOWER EXTREMITY ROM:  Passive ROM Left/Right 07/01/2024   Hip flexion 70/90   Hip extension    Hip abduction    Hip adduction    Hip internal rotation 3/2   Hip external rotation 28/23   Knee flexion    Knee extension    Ankle dorsiflexion    Ankle plantarflexion    Ankle inversion    Ankle eversion     (Blank rows = not tested)  LOWER EXTREMITY MMT:  MMT Left/Right 07/01/2024   Hip flexion 5/5   Hip extension    Hip abduction 3+/3   Hip adduction    Hip internal rotation    Hip external rotation    Knee flexion    Knee  extension 5/5   Ankle dorsiflexion    Ankle plantarflexion    Ankle inversion    Ankle eversion     (Blank rows = not tested)  GAIT: Distance walked: 50 feet Assistive device utilized: None Level of assistance: Complete Independence Comments: Paul Ray's weight-bearing endurance is very poor with groin pain that increases rapidly with weight-bearing  TREATMENT DATE:  07/14/2024 Single knee to chest stretch 4 x 20 seconds Modified Thomas stretch 4 x 20 seconds (knee to chest with other leg off table) Supine hamstrings stretch 4 x 20 seconds Figure 4 (push) stretch 4 x 20 seconds Side-lie clams with Black Thera-band 10 x 3 seconds, slow eccentrics Side-lie hip abduction (1/4 turn to stomach, stay straight from shoulder to hip to knee) 10 x 3 seconds Hip hike in door frame attempted but hold for now  97535: Update HEP   07/05/24 Hip hike in door frame 10 x 3 seconds Single knee to chest stretch 4 x 20 seconds Side-lie clams with Black Thera-band 2x 10 x 3 seconds, slow eccentrics Side-lie hip abduction 2x10 each side  Supine hamstrings stretch 3 x 25 seconds Figure 4 (push) stretch 3 x 25 seconds Manual Abductor and hip flexor stretch   07/01/2024  Single knee to chest stretch 4 x 20 seconds Supine hamstrings stretch 4 x 20 seconds Figure 4 (push) stretch 4 x 20 seconds Side-lie clams with Black Thera-band 10 x 3 seconds, slow eccentrics Side-lie hip abduction (1/4 turn to stomach, stay straight from shoulder to hip to knee) 10 x 3 seconds Hip hike in door frame 5 x 3 seconds  02464: Review hip anatomy, exam findings and day 1 HEP   PATIENT EDUCATION:  Education details: See above Person educated: Patient Education method: Explanation, Demonstration, Tactile cues, Verbal cues, and Handouts Education comprehension: verbalized understanding, returned  demonstration, verbal cues required, tactile cues required, and needs further education  HOME EXERCISE PROGRAM: Access Code: [email or web address] URL: [email or web address] Date: 07/01/2024 Prepared by: Lamar Ivory  Exercises - Single Knee to Chest Stretch  - 1-2 x daily - 7 x weekly - 1 sets - 5 reps - 10-15 seconds hold - Supine Hamstring Stretch  - 1-2 x daily - 7 x weekly - 1 sets - 5 reps - 10-15 seconds hold - Supine Figure 4 Piriformis Stretch  - 1-2 x daily - 7 x weekly - 1 sets - 5 reps - 10-15 seconds hold - Clamshell with Resistance  - 1-2 x daily - 7 x weekly - 2-3 sets - 10 reps - 3 seconds hold - Sidelying Hip Abduction  - 1-2 x daily - 7 x weekly - 2-3 sets - 10 reps - 3 seconds hold - Standing Hip Hiking  - 5 x daily - 7 x weekly - 1 sets - 5 reps - 3 seconds hold  ASSESSMENT:  CLINICAL IMPRESSION: Paul Ray appears to be giving very good effort with his current physical therapy program.  I did encourage him to increase participation to 2 times per day (currently 1 time per day).  He also just had an MRI and other imaging that he will follow-up with Dr. Vernetta to discuss.  Physical therapy remains focused on hip active range of motion, flexibility and function to meet long-term goals.  OBJECTIVE IMPAIRMENTS: Abnormal gait, decreased activity tolerance, decreased endurance, decreased knowledge of condition, difficulty walking, decreased ROM, decreased strength, impaired perceived functional ability, impaired flexibility, and pain.   ACTIVITY LIMITATIONS: carrying, lifting, bending, standing, squatting, stairs, dressing, and locomotion level  PARTICIPATION LIMITATIONS: driving, community activity, and the gym  PERSONAL FACTORS: Previous laminectomy/discectomy, right shoulder arthroscopy, bilat hip resurfacing/replacements are also affecting patient's functional outcome.   REHAB POTENTIAL: Good  CLINICAL DECISION MAKING: Evolving/moderate complexity  EVALUATION  COMPLEXITY: Moderate   GOALS: Goals reviewed with patient? Yes  SHORT TERM GOALS: Target date: 08/05/2024 Paul Ray will be  independent with his day 1 HEP Baseline: Started 07/01/2024 Goal status: Met 07/14/2024  2.  Improve hip flexion to 90; hamstrings flexibility to 45 and hip ER to 30 degrees Baseline: 70/90; 35/45 and 28/23 respectively Goal status: INITIAL  3.  Paul Ray will have improved hip abductors strength as assessed by MMT Baseline: 3+/3 Goal status: INITIAL   LONG TERM GOALS: Target date: 09/23/2024  Improve Patient Specific Functional Score to 5 or better Baseline: 1 Goal status: INITIAL  2.  Improve bilateral groin/hip pain to consistently 0-3/10 Baseline: 3-10/10 Goal status: INITIAL  3.  Improve bilateral hip abductors strength to 4/4 MMT Baseline: 3+/3 MMT Goal status: INITIAL  4.  Paul Ray will be able to return to doing his previous PT exercises and walking 1/2 mile or greater without increasing groin/hip pain Baseline: Unable at evaluation Goal status: INITIAL  5.  Paul Ray will be independent with his long-term maintenance HEP at [place] Baseline: Started 07/01/2024 Goal status: INITIAL   PLAN:  PT FREQUENCY: 1x/week  PT DURATION: 12 weeks  PLANNED INTERVENTIONS: 97750- Physical Performance Testing, 97110-Therapeutic exercises, 97530- Therapeutic activity, [id number]- Neuromuscular re-education, 97535- Self Care, 02859- Manual therapy, [id number]- Gait training, [phone] (1-2 muscles), 20561 (3+ muscles)- Dry Needling, Patient/Family education, Balance training, Stair training, Cryotherapy, and Moist heat  PLAN FOR NEXT SESSION: Hip abductors strength emphasis while avoiding overuse.  AROM and flexibility as prescribed.  Recheck active range of motion and patient-specific functional score.   Myer LELON Ivory, PT, MPT 07/14/2024, 4:24 PM

## 2024-07-19 ENCOUNTER — Telehealth: Payer: Self-pay | Admitting: Orthopaedic Surgery

## 2024-07-19 NOTE — Telephone Encounter (Signed)
 Pt called stating he waiting for results from Bone density and MRI review. Please call pt about this matter at [phone].

## 2024-07-20 ENCOUNTER — Encounter: Payer: Self-pay | Admitting: Rehabilitative and Restorative Service Providers"

## 2024-07-20 ENCOUNTER — Ambulatory Visit: Admitting: Rehabilitative and Restorative Service Providers"

## 2024-07-20 DIAGNOSIS — R262 Difficulty in walking, not elsewhere classified: Secondary | ICD-10-CM

## 2024-07-20 DIAGNOSIS — M25551 Pain in right hip: Secondary | ICD-10-CM | POA: Diagnosis not present

## 2024-07-20 DIAGNOSIS — M6281 Muscle weakness (generalized): Secondary | ICD-10-CM

## 2024-07-20 DIAGNOSIS — M25552 Pain in left hip: Secondary | ICD-10-CM

## 2024-07-20 DIAGNOSIS — M25651 Stiffness of right hip, not elsewhere classified: Secondary | ICD-10-CM

## 2024-07-20 DIAGNOSIS — M25652 Stiffness of left hip, not elsewhere classified: Secondary | ICD-10-CM | POA: Diagnosis not present

## 2024-07-20 NOTE — Therapy (Addendum)
 " OUTPATIENT PHYSICAL THERAPY LOWER EXTREMITY  TREATMENT/DC   Patient Name: Paul Ray MRN: [id number] DOB:April 02, 1956, 68 y.o., male Today's Date: 07/20/2024  END OF SESSION:  PT End of Session - 07/20/24 1036     Visit Number 4    Number of Visits 12    Date for Recertification  09/23/24    PT Start Time 1018    PT Stop Time 1102    PT Time Calculation (min) 44 min    Activity Tolerance Patient tolerated treatment well;No increased pain;Patient limited by pain    Behavior During Therapy [facility] for tasks assessed/performed             Past Medical History:  Diagnosis Date   Abscess of lung (HCC)    Right lower lobe  IMO SNOMED Dx Update Oct 2024     Abscess of lung(513.0)    Right lower lobe   ADD (attention deficit disorder)    Anxiety    Arthritis    Ascending aortic aneurysm    47mm by Chest CTA 05/2020 and 48mm by echo 05/2020   Benign essential HTN    Bipolar depression (HCC)    Coronary artery calcification seen on CAT scan    Empyema lung (HCC)    Hemorrhoids    Hepatitis C    History of lumbar laminectomy for spinal cord decompression 01/08/2022   History of total hip arthroplasty 04/23/2011   Pneumonia    last year   Prostate abscess    Staph infection    Viral pericarditis    after COVID 19 infection   Past Surgical History:  Procedure Laterality Date   ELBOW SURGERY  2005   Right   FOOT NEUROMA SURGERY  2006   Left   HIP RESURFACING     left hip at [facility] 01/2011   LUMBAR LAMINECTOMY/DECOMPRESSION MICRODISCECTOMY  07/24/2011   Procedure: LUMBAR LAMINECTOMY/DECOMPRESSION MICRODISCECTOMY;  Surgeon: Alm GORMAN Molt;  Location: [facility];  Service: Neurosurgery;  Laterality: Bilateral;  Bilateral  , Lumbar Three-Four, Lumbar Four-Five Decompressive Laminectomy Rm # 32   LUNG SURGERY     for empyema   SHOULDER ARTHROSCOPY Right 07/05/2013   Procedure: RIGHT SHOULDER ARTHROSCOPY WITH DEBRIDEMENT;  Surgeon: Lonni CINDERELLA Poli, MD;  Location: [facility] OR;   Service: Orthopedics;  Laterality: Right;   TENDON TRANSFER Left 01/26/2014   Procedure: LEFT THUMB TRAPEZIECTOMY KNOTTED TENDON INTRAPOSITION TRANSFER OF ABDUCTOR POLLICIS LONGUS TO THENARS;  Surgeon: Lamar Leonor Mickey LULLA, MD;  Location: [facility];  Service: Orthopedics;  Laterality: Left;   THORACOTOMY  November 14 2010   rt   Patient Active Problem List   Diagnosis Date Noted   Tinea pedis 12/17/2023   Cerumen impaction 12/17/2023   Presbycusis 12/17/2023   Foraminal stenosis of cervical region 03/08/2021   Ascending aortic aneurysm    Benign essential HTN    Chronic right shoulder pain 05/04/2017   Osteoarthritis of right hip 12/02/2016   Chronic low back pain 08/16/2013   Bipolar depression (HCC)    ADD (attention deficit disorder)     PCP: Cleatus Debby Specking, MD  REFERRING PROVIDER: Lonni CINDERELLA Poli, MD  REFERRING DIAG:  [phone] (ICD-10-CM) - Bilateral hip pain    THERAPY DIAG:  Difficulty in walking, not elsewhere classified  Muscle weakness (generalized)  Bilateral hip pain  Stiffness of left hip, not elsewhere classified  Stiffness of right hip, not elsewhere classified  Rationale for Evaluation and Treatment: Rehabilitation  ONSET DATE: 6 weeks  ago, gradual onset  SUBJECTIVE:   SUBJECTIVE STATEMENT: [facility]  Stretches are 2 x a day.  Rt hip pain continues to affect his everyday function in a significant way.  PERTINENT HISTORY: Previous laminectomy/discectomy, right shoulder arthroscopy, bilat hip resurfacing/replacements   PAIN:  Are you having pain? Yes: NPRS scale: Remains as high as 9/10 this week  Pain location: Rt > Lt hip/groin Pain description: Sharp/stabbing Aggravating factors: Basketball, Pickleball Relieving factors: Pain meds, sitting (off the feet)  PRECAUTIONS: Other: Be aware of very arthritic left shoulder and multiple low back surgeries  RED  FLAGS: None   WEIGHT BEARING RESTRICTIONS: No  FALLS:  Has patient fallen in last 6 months? No  LIVING ENVIRONMENT: Lives with: lives alone Lives in: House/apartment Stairs: Does them, but some limitations Has following equipment at home: None  OCCUPATION: Retired  PLOF: Independent  PATIENT GOALS: Return to the gym, get back to his normal PT exercises, pickle ball, basketball  NEXT MD VISIT: 08/10/2024  OBJECTIVE:  Note: Objective measures were completed at Evaluation unless otherwise noted.  DIAGNOSTIC FINDINGS: See chart  PATIENT SURVEYS:  PSFS: THE PATIENT SPECIFIC FUNCTIONAL SCALE  Place score of 0-10 (0 = unable to perform activity and 10 = able to perform activity at the same level as before injury or problem)  Activity Date: 07/01/2024    Gym activities 2    2.   Walking  2    3.   Basketball 0    4.   Pickle ball 0    Total Score 1      Total Score = Sum of activity scores/number of activities  Minimally Detectable Change: 3 points (for single activity); 2 points (for average score)  [facility] (nd). The Patient Specific Functional Scale . Retrieved from Skateoasis.com.[email or web address]   COGNITION: Overall cognitive status: Within functional limits for tasks assessed     SENSATION: No new complaints of peripheral pain or paresthesias  MUSCLE LENGTH: Hamstrings: Right 45 deg; Left 35 deg  LOWER EXTREMITY ROM:  Passive ROM Left/Right 07/01/2024 Left/Right 07/20/2024  Hip flexion 70/90 90/90  Hip extension    Hip abduction    Hip adduction    Hip internal rotation 3/2 2/4  Hip external rotation 28/23 25/35  Knee flexion    Knee extension    Ankle dorsiflexion    Ankle plantarflexion    Ankle inversion    Ankle eversion    Hamstrings 35/45 40/40   (Blank rows = not tested)  LOWER EXTREMITY MMT:  MMT Left/Right 07/01/2024 Left/Right   Hip flexion 5/5   Hip extension    Hip abduction  3+/3   Hip adduction    Hip internal rotation    Hip external rotation    Knee flexion    Knee extension 5/5   Ankle dorsiflexion    Ankle plantarflexion    Ankle inversion    Ankle eversion     (Blank rows = not tested)  GAIT: Distance walked: 50 feet Assistive device utilized: None Level of assistance: Complete Independence Comments: Yee's weight-bearing endurance is very poor with groin pain that increases rapidly with weight-bearing  TREATMENT DATE:  07/20/2024 Single knee to chest stretch 4 x 20 seconds Modified Thomas stretch 4 x 20 seconds (knee to chest with other leg off table) Supine hamstrings stretch 4 x 20 seconds Figure 4 (push) stretch 4 x 20 seconds Gluteal stretch 4 x 20 seconds Side-lie clams with Black Thera-band 2 sets of 10 x 3 seconds, slow eccentrics Side-lie hip abduction (1/4 turn to stomach, stay straight from shoulder to hip to knee) 2 sets of 10 x 3 seconds  97535: Update HEP    07/14/2024 Single knee to chest stretch 4 x 20 seconds Modified Thomas stretch 4 x 20 seconds (knee to chest with other leg off table) Supine hamstrings stretch 4 x 20 seconds Figure 4 (push) stretch 4 x 20 seconds Side-lie clams with Black Thera-band 10 x 3 seconds, slow eccentrics Side-lie hip abduction (1/4 turn to stomach, stay straight from shoulder to hip to knee) 10 x 3 seconds Hip hike in door frame attempted but hold for now  97535: Update HEP   07/05/24 Hip hike in door frame 10 x 3 seconds Single knee to chest stretch 4 x 20 seconds Side-lie clams with Black Thera-band 2x 10 x 3 seconds, slow eccentrics Side-lie hip abduction 2x10 each side  Supine hamstrings stretch 3 x 25 seconds Figure 4 (push) stretch 3 x 25 seconds Manual Abductor and hip flexor stretch   07/01/2024  Single knee to chest stretch 4 x 20 seconds Supine  hamstrings stretch 4 x 20 seconds Figure 4 (push) stretch 4 x 20 seconds Side-lie clams with Black Thera-band 10 x 3 seconds, slow eccentrics Side-lie hip abduction (1/4 turn to stomach, stay straight from shoulder to hip to knee) 10 x 3 seconds Hip hike in door frame 5 x 3 seconds  97535: Review hip anatomy, exam findings and day 1 HEP   PATIENT EDUCATION:  Education details: See above Person educated: Patient Education method: Explanation, Demonstration, Tactile cues, Verbal cues, and Handouts Education comprehension: verbalized understanding, returned demonstration, verbal cues required, tactile cues required, and needs further education  HOME EXERCISE PROGRAM: Access Code: [email or web address] URL: [email or web address] Date: 07/01/2024 Prepared by: Lamar Ivory  Exercises - Single Knee to Chest Stretch  - 1-2 x daily - 7 x weekly - 1 sets - 5 reps - 10-15 seconds hold - Supine Hamstring Stretch  - 1-2 x daily - 7 x weekly - 1 sets - 5 reps - 10-15 seconds hold - Supine Figure 4 Piriformis Stretch  - 1-2 x daily - 7 x weekly - 1 sets - 5 reps - 10-15 seconds hold - Clamshell with Resistance  - 1-2 x daily - 7 x weekly - 2-3 sets - 10 reps - 3 seconds hold - Sidelying Hip Abduction  - 1-2 x daily - 7 x weekly - 2-3 sets - 10 reps - 3 seconds hold - Standing Hip Hiking  - 5 x daily - 7 x weekly - 1 sets - 5 reps - 3 seconds hold  ASSESSMENT:  CLINICAL IMPRESSION: Roxie continues to give very good effort with his current physical therapy program.  It looks like his bone scan shows no problems with his prosthesis but he will confirm this with Dr. Vernetta in January.  He will follow-up with Dr. Vernetta to discuss his MRI and bone scan in January.  Physical therapy progressions for hip strength will be made on his next visit.  OBJECTIVE IMPAIRMENTS: Abnormal gait, decreased activity tolerance, decreased endurance, decreased knowledge of condition, difficulty walking,  decreased  ROM, decreased strength, impaired perceived functional ability, impaired flexibility, and pain.   ACTIVITY LIMITATIONS: carrying, lifting, bending, standing, squatting, stairs, dressing, and locomotion level  PARTICIPATION LIMITATIONS: driving, community activity, and the gym  PERSONAL FACTORS: Previous laminectomy/discectomy, right shoulder arthroscopy, bilat hip resurfacing/replacements are also affecting patient's functional outcome.   REHAB POTENTIAL: Good  CLINICAL DECISION MAKING: Evolving/moderate complexity  EVALUATION COMPLEXITY: Moderate   GOALS: Goals reviewed with patient? Yes  SHORT TERM GOALS: Target date: 08/05/2024 Zacory will be independent with his day 1 HEP Baseline: Started 07/01/2024 Goal status: Met 07/14/2024  2.  Improve hip flexion to 90; hamstrings flexibility to 45 and hip ER to 30 degrees Baseline: 70/90; 35/45 and 28/23 respectively Goal status: Partially Met 07/20/2024  3.  Adger will have improved hip abductors strength as assessed by MMT Baseline: 3+/3 Goal status: INITIAL   LONG TERM GOALS: Target date: 09/23/2024  Improve Patient Specific Functional Score to 5 or better Baseline: 1 Goal status: INITIAL  2.  Improve bilateral groin/hip pain to consistently 0-3/10 Baseline: 3-10/10 Goal status: Ongoing 07/20/2024  3.  Improve bilateral hip abductors strength to 4/4 MMT Baseline: 3+/3 MMT Goal status: INITIAL  4.  Shepherd will be able to return to doing his previous PT exercises and walking 1/2 mile or greater without increasing groin/hip pain Baseline: Unable at evaluation Goal status: Ongoing 07/20/2024  5.  Deontrae will be independent with his long-term maintenance HEP at [place] Baseline: Started 07/01/2024 Goal status: INITIAL   PLAN:  PT FREQUENCY: 1x/week  PT DURATION: 12 weeks  PLANNED INTERVENTIONS: 97750- Physical Performance Testing, 97110-Therapeutic exercises, 97530- Therapeutic activity, [id number]- Neuromuscular re-education,  97535- Self Care, 02859- Manual therapy, [id number]- Gait training, [phone] (1-2 muscles), 20561 (3+ muscles)- Dry Needling, Patient/Family education, Balance training, Stair training, Cryotherapy, and Moist heat  PLAN FOR NEXT SESSION: Hip abductors strength emphasis while avoiding overuse.  AROM and flexibility as prescribed.  Recheck strength and patient-specific functional score.   Myer LELON Ivory, PT, MPT 07/20/2024, 12:18 PM  PHYSICAL THERAPY DISCHARGE SUMMARY  Visits from Start of Care: 4  Current functional level related to goals / functional outcomes: See above   Remaining deficits: See above   Education / Equipment: HEP   Patient agrees to discharge. Patient goals were not met. Patient is being discharged due to not returning since the last visit.  "

## 2024-07-22 ENCOUNTER — Telehealth: Payer: Self-pay | Admitting: Orthopaedic Surgery

## 2024-07-22 NOTE — Telephone Encounter (Signed)
 Pt called wanting to know the results of his MRI. He says that his rt hip is still bothering him. Call back number is [phone].

## 2024-07-25 ENCOUNTER — Telehealth: Payer: Self-pay | Admitting: Orthopaedic Surgery

## 2024-07-25 NOTE — Telephone Encounter (Signed)
 Pt called asking for a call from [facility]. Pt states PT is making his right hip hurt worse. Pt is asking for Vernetta to call him at [phone].

## 2024-07-26 ENCOUNTER — Other Ambulatory Visit: Payer: Self-pay

## 2024-07-26 ENCOUNTER — Encounter: Admitting: Rehabilitative and Restorative Service Providers"

## 2024-07-26 ENCOUNTER — Telehealth: Payer: Self-pay | Admitting: Behavioral Health

## 2024-07-26 DIAGNOSIS — F902 Attention-deficit hyperactivity disorder, combined type: Secondary | ICD-10-CM

## 2024-07-26 MED ORDER — AMPHETAMINE-DEXTROAMPHETAMINE 20 MG PO TABS: 20.0000 mg | ORAL_TABLET | Freq: Every day | ORAL | 0 refills | Status: AC

## 2024-07-26 NOTE — Telephone Encounter (Signed)
 LF 12/3, due 12/31, pended to provider today with fill date 12/31. LV 8/15

## 2024-07-26 NOTE — Telephone Encounter (Signed)
 Pt lvm to req rf of Adderall 20mg .     [facility] [address]

## 2024-07-26 NOTE — Telephone Encounter (Signed)
 LF 12/3. Pended to provider today with fill date 12/31.

## 2024-07-27 ENCOUNTER — Telehealth: Payer: Self-pay | Admitting: Orthopaedic Surgery

## 2024-07-27 ENCOUNTER — Other Ambulatory Visit: Payer: Self-pay | Admitting: Orthopaedic Surgery

## 2024-07-27 ENCOUNTER — Ambulatory Visit: Admitting: Orthopaedic Surgery

## 2024-07-27 ENCOUNTER — Encounter: Payer: Self-pay | Admitting: Orthopaedic Surgery

## 2024-07-27 DIAGNOSIS — Z96641 Presence of right artificial hip joint: Secondary | ICD-10-CM

## 2024-07-27 DIAGNOSIS — M25551 Pain in right hip: Secondary | ICD-10-CM | POA: Diagnosis not present

## 2024-07-27 MED ORDER — TIZANIDINE HCL 4 MG PO TABS: 4.0000 mg | ORAL_TABLET | Freq: Three times a day (TID) | ORAL | 0 refills | Status: AC | PRN

## 2024-07-27 MED ORDER — CELECOXIB 200 MG PO CAPS: 200.0000 mg | ORAL_CAPSULE | Freq: Two times a day (BID) | ORAL | 1 refills | Status: DC | PRN

## 2024-07-27 NOTE — Progress Notes (Signed)
 Paul Ray comes in today to go over the MRI and three-phase bone scan as a relates to painful right hip.  He has a history of hip resurfacing arthroplasties of both his hips that were done elsewhere.  His right hip has been bothering him quite a bit and has been to physical therapy and that has not helped.  It is a chronic pain and is aching pain that is in the joint area.  Previous x-rays did not show any evidence of loosening of the prosthesis or complicating features.  He is not obese.  He tries to stay active and he is physically fit for 68.  The three-phase bone scan and the MRI did not show any worrisome findings as far as his hip arthroplasty goes.  His pain is still consistent around that hip itself.  At this point I would like to send him to my partner Dr. Burnetta for his evaluation of Paul Ray soft tissue around his right hip and even setting him up for a one-time steroid injection around the right hip.  Hopefully that would then decrease his symptoms and help him feel much better.  If that fails, we would need to send him to a revision specialist for their opinion.  He agrees with this treatment plan.  Will work on getting him in with Dr. Burnetta hopefully soon.

## 2024-07-27 NOTE — Telephone Encounter (Signed)
 Pt came back in to the office wanting to see if he could get Dr. Vernetta to prescribe him some pain medication until his appt 08/10/2024 w/Dr. Burnetta.  Pharm: [facility] on [address].

## 2024-07-29 ENCOUNTER — Encounter: Payer: Self-pay | Admitting: Family Medicine

## 2024-07-29 ENCOUNTER — Telehealth: Payer: Self-pay | Admitting: Orthopaedic Surgery

## 2024-07-29 ENCOUNTER — Ambulatory Visit: Payer: Self-pay | Admitting: *Deleted

## 2024-07-29 ENCOUNTER — Ambulatory Visit: Admitting: Family Medicine

## 2024-07-29 VITALS — BP 108/74 | HR 78 | Temp 97.8°F | Resp 16 | Ht 67.5 in | Wt 168.6 lb

## 2024-07-29 DIAGNOSIS — M25551 Pain in right hip: Secondary | ICD-10-CM | POA: Diagnosis not present

## 2024-07-29 DIAGNOSIS — R1031 Right lower quadrant pain: Secondary | ICD-10-CM

## 2024-07-29 NOTE — Progress Notes (Signed)
 "  Subjective:  Patient ID: Paul Ray, male    DOB: Jul 17, 1956  Age: 69 y.o. MRN: [id number]  CC:  Chief Complaint  Patient presents with   Acute Visit    Right hip pain low abdominal /groin pain, NO fever. Sx started 2 months ago. Getting worse. No injury related. Wondering if it could possibly be a hernia. Medication celebrex and tiZANidine. Oxycodone  gave some relief.     HPI Paul Ray presents for   Acute visit for above, PCP is Dr. Jerrell.  Right hip/groin pain Symptoms started 2 months ago but has been getting worse.  No known injury.  He has tried Celebrex, tizanidine past few days. No relief yet.  No relief with alleve. Oxycodone  helped some prior - last took about 4 weeks ago.    Today, reports he has had continued R hip pain, in spite of studies not indicating a cause. He talked to someone at [facility] who mentioned a possible hernia that might cause pain.  Appt with Dr. Burnetta in 12 days.  No hernia noted on abdomen/pelvic CT in 03/2022.     On chart review he has been under the care of orthopedics, Dr. Vernetta with appointment 2 days ago.  Painful right hip discussed at that time, prior history of hip resurfacing arthroplasties of both hips done elsewhere.  Right hip pain had persisted, including after physical therapy which did not provide relief.  X-rays did not show any evidence of loosening of the prosthesis or complicating features.  MRI did not show any worrisome findings as far as the hip arthroplasty.  Pain was located around the hip.  He was referred to Dr. Burnetta for evaluation for possible steroid injection around the hip, and if ineffective, consideration of seeing a revision specialist for their opinion.  Phone note reviewed, requesting pain medication.  Celebrex and Zanaflex were both ordered on 07/27/2024. Controlled substance database reviewed.  Alprazolam  1 mg #90 last filled on 06/29/2024.  DEXA amphetamine -amphetamine  ER 30 mg #30 on 06/29/2024,  dextroamphetamine -amphetamine  20 mg #30 on 06/29/2024, oxycodone  No. 20 on 06/22/2024, 06/15/2024, 06/07/2024, 06/01/2024.  Oxycodone  prescribed by [facility] physiatry, Duwaine Pouch, NP.    History Patient Active Problem List   Diagnosis Date Noted   Tinea pedis 12/17/2023   Cerumen impaction 12/17/2023   Presbycusis 12/17/2023   Foraminal stenosis of cervical region 03/08/2021   Ascending aortic aneurysm    Benign essential HTN    Chronic right shoulder pain 05/04/2017   Osteoarthritis of right hip 12/02/2016   Chronic low back pain 08/16/2013   Bipolar depression (HCC)    ADD (attention deficit disorder)    Past Medical History:  Diagnosis Date   Abscess of lung (HCC)    Right lower lobe  IMO SNOMED Dx Update Oct 2024     Abscess of lung(513.0)    Right lower lobe   ADD (attention deficit disorder)    Anxiety    Arthritis    Ascending aortic aneurysm    47mm by Chest CTA 05/2020 and 48mm by echo 05/2020   Benign essential HTN    Bipolar depression (HCC)    Coronary artery calcification seen on CAT scan    Empyema lung (HCC)    Hemorrhoids    Hepatitis C    History of lumbar laminectomy for spinal cord decompression 01/08/2022   History of total hip arthroplasty 04/23/2011   Pneumonia    last year   Prostate abscess    Staph infection  Viral pericarditis    after COVID 19 infection   Past Surgical History:  Procedure Laterality Date   ELBOW SURGERY  2005   Right   FOOT NEUROMA SURGERY  2006   Left   HIP RESURFACING     left hip [facility] 01/2011   LUMBAR LAMINECTOMY/DECOMPRESSION MICRODISCECTOMY  07/24/2011   Procedure: LUMBAR LAMINECTOMY/DECOMPRESSION MICRODISCECTOMY;  Surgeon: Alm GORMAN Molt;  Location: [facility];  Service: Neurosurgery;  Laterality: Bilateral;  Bilateral  , Lumbar Three-Four, Lumbar Four-Five Decompressive Laminectomy Rm # [id number]   LUNG SURGERY     for empyema   SHOULDER ARTHROSCOPY Right 07/05/2013   Procedure: RIGHT SHOULDER ARTHROSCOPY  WITH DEBRIDEMENT;  Surgeon: Lonni CINDERELLA Poli, MD;  Location: [facility] OR;  Service: Orthopedics;  Laterality: Right;   TENDON TRANSFER Left 01/26/2014   Procedure: LEFT THUMB TRAPEZIECTOMY KNOTTED TENDON INTRAPOSITION TRANSFER OF ABDUCTOR POLLICIS LONGUS TO THENARS;  Surgeon: Lamar Leonor Mickey LULLA, MD;  Location: [facility];  Service: Orthopedics;  Laterality: Left;   THORACOTOMY  November 14 2010   rt   Allergies[1] Prior to Admission medications  Medication Sig Start Date End Date Taking? Authorizing Provider  ALPRAZolam  (XANAX ) 1 MG tablet Take 1 tablet (1 mg total) by mouth 3 (three) times daily as needed for anxiety. 03/11/24  Yes White, Redell LABOR, NP  amphetamine -dextroamphetamine  (ADDERALL XR) 30 MG 24 hr capsule Take 1 capsule (30 mg) by mouth every morning. 07/25/24  Yes Teresa Redell LABOR, NP  amphetamine -dextroamphetamine  (ADDERALL XR) 30 MG 24 hr capsule Take 1 capsule (30 mg) by mouth every morning. 06/27/24  Yes White, Redell LABOR, NP  amphetamine -dextroamphetamine  (ADDERALL XR) 30 MG 24 hr capsule Take 1 capsule (30 mg) by mouth every morning. 05/30/24  Yes White, Redell LABOR, NP  amphetamine -dextroamphetamine  (ADDERALL) 20 MG tablet Take 1 tablet (20 mg total) by mouth daily. 05/27/24 07/29/24 Yes White, Redell LABOR, NP  amphetamine -dextroamphetamine  (ADDERALL) 20 MG tablet Take 1 tablet (20 mg total) by mouth daily. 06/24/24 07/29/24 Yes White, Redell LABOR, NP  amphetamine -dextroamphetamine  (ADDERALL) 20 MG tablet Take 1 tablet (20 mg total) by mouth daily. 07/27/24 08/26/24 Yes White, Redell LABOR, NP  amphetamine -dextroamphetamine  (ADDERALL) 20 MG tablet Take 1 tablet (20 mg total) by mouth daily. 08/23/24 09/22/24 Yes White, Redell A, NP  cariprazine  (VRAYLAR ) 1.5 MG capsule Take 1 capsule (1.5 mg total) by mouth daily. 03/11/24  Yes Teresa Redell A, NP  celecoxib (CELEBREX) 200 MG capsule Take 1 capsule (200 mg total) by mouth 2 (two) times daily between meals as needed. 07/27/24  Yes Poli Lonni CINDERELLA,  MD  lamoTRIgine  (LAMICTAL ) 200 MG tablet Take 1 tablet (200 mg total) by mouth daily. 03/11/24  Yes White, Redell LABOR, NP  oxyCODONE -acetaminophen  (PERCOCET/ROXICET) 5-325 MG tablet Take 1 tablet by mouth every 8 (eight) hours as needed for severe pain (pain score 7-10). 06/22/24  Yes Williams, Megan E, NP  silodosin (RAPAFLO) 8 MG CAPS capsule Take 8 mg by mouth daily. 12/08/21  Yes [provider]  tiZANidine (ZANAFLEX) 4 MG tablet Take 1 tablet (4 mg total) by mouth every 8 (eight) hours as needed for muscle spasms. 07/27/24  Yes Poli Lonni CINDERELLA, MD   Social History   Socioeconomic History   Marital status: Single    Spouse name: Not on file   Number of children: 2   Years of education: 12   Highest education level: High school graduate  Occupational History   Occupation: carpenter  Tobacco Use   Smoking  status: Never   Smokeless tobacco: Never  Vaping Use   Vaping status: Never Used  Substance and Sexual Activity   Alcohol use: No    Comment: quit drinking in 1989   Drug use: Not Currently    Types: Hydrocodone     Comment: Prior heavy use of opioid pain medications up until 2 years ago   Sexual activity: Yes  Other Topics Concern   Not on file  Social History Narrative   2 children: Vernell and [name]   Lives by himself, no pets.    Social Drivers of Health   Tobacco Use: Low Risk (07/29/2024)   Patient History    Smoking Tobacco Use: Never    Smokeless Tobacco Use: Never    Passive Exposure: Not on file  Financial Resource Strain: Not on file  Food Insecurity: Not on file  Transportation Needs: Not on file  Physical Activity: Not on file  Stress: Not on file  Social Connections: Not on file  Intimate Partner Violence: Not on file  Depression (PHQ2-9): Low Risk (03/18/2024)   Depression (PHQ2-9)    PHQ-2 Score: 0  Alcohol Screen: Not on file  Housing: Not on file  Utilities: Not on file  Health Literacy: Not on file    Review of  Systems   Objective:   Vitals:   07/29/24 1038  BP: 108/74  Pulse: 78  Resp: 16  Temp: 97.8 F (36.6 C)  TempSrc: Temporal  SpO2: 100%  Weight: 168 lb 9.6 oz (76.5 kg)  Height: 5' 7.5 (1.715 m)     Physical Exam Vitals reviewed.  Constitutional:      General: He is not in acute distress.    Appearance: He is not ill-appearing, toxic-appearing or diaphoretic.  Abdominal:     Tenderness: There is no abdominal tenderness (Slight discomfort into the right lower abdomen/pelvis towards the inguinal fold.).  Musculoskeletal:     Comments: Right hip with slight discomfort into the groin with internal, external rotation.  He reports that when he stands he feels more discomfort placing weight on that hip compared to seated exam. Chaperone offered for groin exam, hernia check, chaperone declined.  He does have slight discomfort into the inguinal fold, no appreciable swelling/bulge including with cough.  Skin intact without erythema or masses palpated.  Skin:    General: Skin is warm and dry.     Findings: No erythema.  Neurological:     Mental Status: He is alert.      Assessment & Plan:  Paul Ray is a 69 y.o. male . Right hip pain - Plan: CT ABDOMEN PELVIS WO CONTRAST  Right groin pain - Plan: CT ABDOMEN PELVIS WO CONTRAST Persistent right hip pain as above, has been under the care of orthopedics with plan for possible injection in the next few weeks.  Imaging has reportedly been reassuring to this point.  He obtained the appointment with me today to evaluate for possible hernia or other contributing cause of that pain.  Although pain primarily is located to the, and pain with standing would indicate more of hip source, he does have some discomfort into the inguinal canal, inguinal fold on right side without apparent hernia swelling or lymphadenopathy.  Also has slight discomfort into the right lower quadrant, pelvis.  I think it would be reasonable to image that area to rule  out underlying soft tissue mass, or hernia although less likely.  In regards to pain treatment, he has just started Celebrex, tizanidine a  few days ago.  If that is ineffective recommended he discuss alternate options with Ortho.  I did caution him on combination of narcotic pain medications and benzodiazepines due to risk of accidental overdose. Plan discussed with patient, agrees with above, and advised PCP of plan as well.  No orders of the defined types were placed in this encounter.  Patient Instructions  Thank you for coming in today.  I am sorry you are still having some pain in that right hip.  I do think it would be reasonable to check a CT scan to rule out a hernia or soft tissue issue as you are sore into the groin area as well.  I have ordered that test and they should be contacting you shortly to schedule it. I do recommend discussing medications with your orthopedist if the Celebrex or tizanidine is not providing relief.  As we discussed I would recommend against combining narcotic pain medications with the alprazolam  due to risk of accidental overdose.  I will let your primary care provider know about our plan today but let us  know if there are questions and hang in there.     Signed,   Reyes Pines, MD [facility], [facility] 07/29/2024 11:39 AM      [1]  Allergies Allergen Reactions   Quinolones Other (See Comments)    Patient was warned about not using Cipro  and similar antibiotics. Recent studies have raised concern that fluoroquinolone antibiotics could be associated with an increased risk of aortic aneurysm Fluoroquinolones have non-antimicrobial properties that might jeopardise the integrity of the extracellular matrix of the vascular wall In a  propensity score matched cohort study in [place], there was a 66% increased rate of aortic aneurysm or dissection associated with oral fluoroquinolone use, compared wit   "

## 2024-07-29 NOTE — Patient Instructions (Addendum)
 Thank you for coming in today.  I am sorry you are still having some pain in that right hip.  I do think it would be reasonable to check a CT scan to rule out a hernia or soft tissue issue as you are sore into the groin area as well.  I have ordered that test and they should be contacting you shortly to schedule it. I do recommend discussing medications with your orthopedist if the Celebrex or tizanidine is not providing relief.  As we discussed I would recommend against combining narcotic pain medications with the alprazolam  due to risk of accidental overdose.  I will let your primary care provider know about our plan today but let us  know if there are questions and hang in there.

## 2024-07-29 NOTE — Telephone Encounter (Signed)
 Pt states the medication Dr. Vernetta prescribed is not working and he wants to know if he could have a Rx for Oxycodone  that seems to help.

## 2024-07-29 NOTE — Telephone Encounter (Signed)
 FYI patient is seeing Dr. Levora today

## 2024-07-29 NOTE — Telephone Encounter (Signed)
 FYI Only or Action Required?: FYI only for provider: appointment scheduled on 07/29/24.  Patient was last seen in primary care on 03/18/2024 by Jerrell Cleatus Ned, MD.  Called Nurse Triage reporting Hip Pain.  Symptoms began several months ago.  Interventions attempted: Prescription medications: naproxen  and Rest, hydration, or home remedies.  Symptoms are: gradually worsening.  Triage Disposition: See HCP Within 4 Hours (Or PCP Triage)  Patient/caregiver understands and will follow disposition?yes        Copied from CRM #[id number]. Topic: Clinical - Red Word Triage >> Jul 29, 2024  8:04 AM Adelita E wrote: [facility] that prompted transfer to Nurse Triage: Serious pain in right hip, going on for over 2 months. Patient questioning if it could be a hernia. Reason for Disposition  [1] SEVERE pain (e.g., excruciating, unable to do any normal activities) AND [2] not improved after 2 hours of pain medicine  Answer Assessment - Initial Assessment Questions Appt today with other provider. None available with PCP.        1. LOCATION and RADIATION: Where is the pain located? Does the pain spread (shoot) anywhere else?     Right hip 2. QUALITY: What does the pain feel like?  (e.g., sharp, dull, aching, burning)     Toward abdominal area and groin, sharp pains can last while on feet and better with sitting  3. SEVERITY: How bad is the pain? What does it keep you from doing?   (Scale 1-10; or mild, moderate, severe)     Severe with rest  4. ONSET: When did the pain start? Does it come and go, or is it there all the time?     2 months comes and goes getting worse over time 5. WORK OR EXERCISE: Has there been any recent work or exercise that involved this part of the body?      na 6. CAUSE: What do you think is causing the hip pain?      Not sure , possible picking up heavy pallot. 7. AGGRAVATING FACTORS: What makes the hip pain worse? (e.g., walking, climbing  stairs, running)     Walking  8. OTHER SYMPTOMS: Do you have any other symptoms? (e.g., back pain, pain shooting down leg,  fever, rash)     Low abdominal to groin area right hip pain no fever  Protocols used: Hip Pain-A-AH

## 2024-08-01 ENCOUNTER — Telehealth: Payer: Self-pay | Admitting: Orthopaedic Surgery

## 2024-08-01 NOTE — Telephone Encounter (Signed)
 Pt called stating he understands that but he is in pain and he not sure what to do. Pt made an appt with his PCP to see if it is hernia. Please call pt at [phone].

## 2024-08-02 ENCOUNTER — Inpatient Hospital Stay: Admission: RE | Admit: 2024-08-02 | Discharge: 2024-08-02 | Attending: Family Medicine | Admitting: Family Medicine

## 2024-08-02 ENCOUNTER — Ambulatory Visit: Payer: Self-pay | Admitting: Family Medicine

## 2024-08-02 ENCOUNTER — Telehealth: Payer: Self-pay | Admitting: Orthopaedic Surgery

## 2024-08-02 ENCOUNTER — Other Ambulatory Visit: Payer: Self-pay | Admitting: Orthopaedic Surgery

## 2024-08-02 DIAGNOSIS — R1031 Right lower quadrant pain: Secondary | ICD-10-CM

## 2024-08-02 DIAGNOSIS — M25551 Pain in right hip: Secondary | ICD-10-CM

## 2024-08-02 MED ORDER — GABAPENTIN 300 MG PO CAPS: 300.0000 mg | ORAL_CAPSULE | Freq: Three times a day (TID) | ORAL | 0 refills | Status: DC | PRN

## 2024-08-02 NOTE — Telephone Encounter (Signed)
 Patient called. Says he does not want a message in mychart. He would like a call from someone ASAP. [phone]

## 2024-08-02 NOTE — Telephone Encounter (Signed)
 What am I to tell him?

## 2024-08-03 ENCOUNTER — Ambulatory Visit: Payer: Self-pay

## 2024-08-03 NOTE — Telephone Encounter (Signed)
 Tried to call patient to offer a sooner appt. Unable to leave vm to return call. Patient has appt tomorrow with you

## 2024-08-03 NOTE — Telephone Encounter (Signed)
" °  FYI Only or Action Required?: FYI only for provider: tomorrow appt placed on wait list.  Patient was last seen in primary care on 07/29/2024 by Levora Reyes SAUNDERS, MD.  Called Nurse Triage reporting appointment request.  Symptoms began several months ago.  Interventions attempted: Prescription medications: gabapentin .  Symptoms are: unchanged.  Triage Disposition: Information or Advice Only Call  Patient/caregiver understands and will follow disposition?: Yes   Copied from CRM #[id number]. Topic: Clinical - Red Word Triage >> Aug 03, 2024 10:22 AM Adelita BRAVO wrote: [facility] that prompted transfer to Nurse Triage: Patient has appointment tomorrow, but hip pain is 10 out of 10 today and cannot wait. Reason for Disposition  [1] Other NON-URGENT information for PCP AND [2] does not require PCP response  Answer Assessment - Initial Assessment Questions Patient did speak with orthopedics yesterday and started Gabapentin . States there has been no improvement.    1. REASON FOR CALL or QUESTION: What is your reason for calling today? or How can I best     Wants to be seen today instead of tomorrow for hip pain 2. CALLER: Document the source of call. (e.g., laboratory staff, caregiver or patient).     patient  Protocols used: PCP Call - No Triage-A-AH  "

## 2024-08-04 ENCOUNTER — Telehealth: Payer: Self-pay | Admitting: Orthopaedic Surgery

## 2024-08-04 ENCOUNTER — Ambulatory Visit: Admitting: Student in an Organized Health Care Education/Training Program

## 2024-08-04 ENCOUNTER — Encounter: Admitting: Rehabilitative and Restorative Service Providers"

## 2024-08-04 NOTE — Telephone Encounter (Signed)
 Pt called because he wants to know what, if any medications he shouldn't take before his injection with Dr. Burnetta. Call back number is [phone]

## 2024-08-04 NOTE — Telephone Encounter (Signed)
 Patient aware nothing needs to be stopped

## 2024-08-05 ENCOUNTER — Encounter: Payer: Self-pay | Admitting: Student in an Organized Health Care Education/Training Program

## 2024-08-05 ENCOUNTER — Other Ambulatory Visit: Payer: Self-pay | Admitting: Orthopaedic Surgery

## 2024-08-05 ENCOUNTER — Ambulatory Visit: Admitting: Student in an Organized Health Care Education/Training Program

## 2024-08-05 ENCOUNTER — Telehealth: Payer: Self-pay | Admitting: Orthopaedic Surgery

## 2024-08-05 VITALS — BP 128/69 | HR 67 | Wt 170.0 lb

## 2024-08-05 DIAGNOSIS — G8929 Other chronic pain: Secondary | ICD-10-CM

## 2024-08-05 DIAGNOSIS — M25551 Pain in right hip: Secondary | ICD-10-CM

## 2024-08-05 DIAGNOSIS — Z96641 Presence of right artificial hip joint: Secondary | ICD-10-CM | POA: Diagnosis not present

## 2024-08-05 DIAGNOSIS — F319 Bipolar disorder, unspecified: Secondary | ICD-10-CM

## 2024-08-05 MED ORDER — HYDROCODONE-ACETAMINOPHEN 5-325 MG PO TABS: 1.0000 | ORAL_TABLET | Freq: Four times a day (QID) | ORAL | 0 refills | Status: DC | PRN

## 2024-08-05 NOTE — Assessment & Plan Note (Signed)
 Mood is mostly stable.  He is clearly frustrated about the chronic discomfort he is feeling in his right hip, and the uncertainty about a solution.  He has follow-up planned with his psychiatrist.  He is continuing medications including alprazolam  for now.  No safety risk based on exam today.

## 2024-08-05 NOTE — Patient Instructions (Signed)
" °  VISIT SUMMARY: During your visit, we discussed your worsening groin pain that has persisted for over two months. Despite multiple diagnostic tests, no clear cause has been identified. We reviewed your history of hip resurfacing and the ineffectiveness of various medications and physical therapy. We also discussed your desire to avoid surgery and long-term opioid use.  YOUR PLAN: -RIGHT HIP PAIN: Chronic right hip pain means you have been experiencing persistent pain in your right hip for an extended period. Although imaging tests have not shown any abnormalities, hip joint issues are still a possibility. We will proceed with the scheduled steroid injection into your right hip on January 14th to help manage the pain. Continue using your current anti-inflammatory medication as needed, and avoid opioids for pain management. We will follow up on your hip pain management at your next appointment on February 26th.  INSTRUCTIONS: Proceed with the scheduled steroid injection into your right hip on January 14th. Continue using your current anti-inflammatory medication as needed for pain management. Avoid opioids for chronic pain management. Follow up on hip pain management at the next appointment on February 26th.   "

## 2024-08-05 NOTE — Assessment & Plan Note (Signed)
 Chronic right hip pain has persisted for over two months and is worsening. Imaging shows no abnormalities.  He is already had bilateral hip arthroplasties, based on exam I think the discomfort in his right groin is coming from the right hip.  Physical therapy has exacerbated symptoms, and current medications are ineffective.  I told the patient that opioids are not recommended.  He is planned for steroid injection into the right hip on January 14th. Continue the current anti-inflammatory regimen as needed for pain management. Avoid opioids for chronic pain management. Follow up on hip pain management at the next appointment on February 26th.  If his discomfort does not improve after the steroid injection into the right hip, orthopedics may consult with a resurfacing specialist.

## 2024-08-05 NOTE — Progress Notes (Signed)
 "  Acute Office Visit  Patient ID: Paul Ray, male    DOB: November 24, 1955, 69 y.o.   MRN: [id number]  PCP: Jerrell Cleatus Ned, MD  Chief Complaint  Patient presents with   Hip Pain    Right hip pain but starting to travel to groin area and hurts to be on feet. Ct completed this past Tuesday.     Subjective:     HPI  Discussed the use of AI scribe software for clinical note transcription with the patient, who gave verbal consent to proceed.  History of Present Illness Paul Ray is a 69 year old male who presents with worsening groin pain.  He has been experiencing sharp groin pain for over two months, which has progressively worsened. The pain is very sharp and exacerbated by being on his feet. Despite multiple diagnostic tests, including X-ray, bone scan, MRI, and a recent CT scan, no clear cause for his symptoms has been identified. The CT scan ruled out a hernia.  He has a history of hip resurfacing, and although imaging shows no issues with the hip, he continues to experience significant pain. He has tried physical therapy in the past for his low back, neck, and shoulder, which he found unhelpful and even exacerbating his current pain. He has also been using the gym regularly but has had to significantly reduce his activity due to the pain.  He has been on various medications including Aleve (naproxen sodium), Celebrex , a muscle relaxer, and gabapentin . These medications have not provided significant relief, and he has discontinued Celebrex  and gabapentin  due to side effects. He continues to use a muscle relaxer once a day.  His past medical history includes multiple surgeries, approximately fifteen over the last twenty years, and a history of using pain medications post-surgery. He wants to avoid surgery and long-term opioid use, having weaned himself off pain medications in the past. He also has a history of hypertension, hearing issues, foot problems, and bipolar disorder.  He  regularly attends the gym and has been active in physical activities until the onset of his current symptoms. No kidney stones were identified on the CT scan.      Objective:    BP 128/69   Pulse 67   Wt 170 lb (77.1 kg)   SpO2 100%   BMI 26.23 kg/m   Physical Exam  Gen: Well-appearing man Neuro: Alert, conversational, slow get up and go, mildly antalgic gait, full strength upper and lower extremities MSK: Decreased range of motion in both hips on internal rotation, there is significant discomfort inside the right groin with internal rotation of the right hip.  There is no pain with palpation over the right greater trochanter. Psych: Appropriate mood, conversational, calm, follows instructions.     Assessment & Plan:   Problem List Items Addressed This Visit       High   Bipolar depression (HCC) (Chronic)   Mood is mostly stable.  He is clearly frustrated about the chronic discomfort he is feeling in his right hip, and the uncertainty about a solution.  He has follow-up planned with his psychiatrist.  He is continuing medications including alprazolam  for now.  No safety risk based on exam today.        Low   Chronic hip pain after total replacement of right hip joint - Primary (Chronic)   Chronic right hip pain has persisted for over two months and is worsening. Imaging shows no abnormalities.  He is already had  bilateral hip arthroplasties, based on exam I think the discomfort in his right groin is coming from the right hip.  Physical therapy has exacerbated symptoms, and current medications are ineffective.  I told the patient that opioids are not recommended.  He is planned for steroid injection into the right hip on January 14th. Continue the current anti-inflammatory regimen as needed for pain management. Avoid opioids for chronic pain management. Follow up on hip pain management at the next appointment on February 26th.  If his discomfort does not improve after the steroid  injection into the right hip, orthopedics may consult with a resurfacing specialist.      Cleatus Debby Specking, MD [facility]   "

## 2024-08-05 NOTE — Telephone Encounter (Signed)
 Patient called says the pain in his hip is bad. He would like something for pain called in for him.

## 2024-08-08 ENCOUNTER — Other Ambulatory Visit: Payer: Self-pay

## 2024-08-08 ENCOUNTER — Telehealth: Payer: Self-pay | Admitting: Orthopedic Surgery

## 2024-08-08 ENCOUNTER — Other Ambulatory Visit: Payer: Self-pay | Admitting: Orthopaedic Surgery

## 2024-08-08 DIAGNOSIS — M25551 Pain in right hip: Secondary | ICD-10-CM

## 2024-08-08 DIAGNOSIS — G8929 Other chronic pain: Secondary | ICD-10-CM

## 2024-08-08 DIAGNOSIS — M5416 Radiculopathy, lumbar region: Secondary | ICD-10-CM

## 2024-08-08 MED ORDER — IBUPROFEN 800 MG PO TABS: 800.0000 mg | ORAL_TABLET | Freq: Three times a day (TID) | ORAL | 3 refills | Status: AC | PRN

## 2024-08-08 NOTE — Telephone Encounter (Signed)
 Paul Ray called in this morning with 2 requests for Dr. Vernetta.  1) He has been speaking with a friend who also has hip pain and recommended that he take prescription Motrin  800mg , not the OTC.  Can you please write Rx for that.  He uses the [facility] on [address]  2) He would like to be referred to [facility] Dr. Bonner for pain management.  Paul Ray has an appt with Dr. Burnetta for an injection but would like to go ahead and start the referral process to Dr. Bonner anyway.  He will cancel if Dr. Burnetta' injection helps.  Call back # is [phone].

## 2024-08-08 NOTE — Telephone Encounter (Signed)
 Patient aware of the below message

## 2024-08-08 NOTE — Progress Notes (Signed)
 I called patient and he said that he made appt with [facility] with Dr. Cam 08/18/24. Patient reported that he is still having a lot of pain and it has gotten worse.

## 2024-08-09 NOTE — Progress Notes (Signed)
 I advised patient that yesterday and he said he is getting a shot but doesn't know if that will help. He just wants to know what is wrong

## 2024-08-09 NOTE — Telephone Encounter (Signed)
 Sorry to hear that he is feeling worse.  If pain is still of the hip area, it may be best to discuss options with his orthopedist.  Will also include his PCP on this message for follow-up if needed and for other recommendations.  Thanks.

## 2024-08-10 ENCOUNTER — Other Ambulatory Visit: Payer: Self-pay

## 2024-08-10 ENCOUNTER — Encounter: Payer: Self-pay | Admitting: Sports Medicine

## 2024-08-10 ENCOUNTER — Ambulatory Visit: Admitting: Orthopaedic Surgery

## 2024-08-10 ENCOUNTER — Ambulatory Visit: Admitting: Sports Medicine

## 2024-08-10 DIAGNOSIS — G8929 Other chronic pain: Secondary | ICD-10-CM | POA: Diagnosis not present

## 2024-08-10 DIAGNOSIS — M25551 Pain in right hip: Secondary | ICD-10-CM

## 2024-08-10 DIAGNOSIS — Z96641 Presence of right artificial hip joint: Secondary | ICD-10-CM

## 2024-08-10 MED ORDER — LIDOCAINE HCL 1 % IJ SOLN
4.0000 mL | INTRAMUSCULAR | Status: AC | PRN
  Administered 2024-08-10: 4 mL

## 2024-08-10 MED ORDER — METHYLPREDNISOLONE ACETATE 40 MG/ML IJ SUSP
80.0000 mg | INTRAMUSCULAR | Status: AC | PRN
  Administered 2024-08-10: 80 mg via INTRA_ARTICULAR

## 2024-08-10 NOTE — Progress Notes (Signed)
 Patient says that he has had pain in the lateral hip and around the anterior hip into the groin. He says that his activity has significantly decreased due to his pain. He previously went to the gym 5x/week, where he would use the stationary bike and then do banded work. He now does 5 minutes on the stationary bike only. He would like to get back to his activity. Patient says that Aleve as needed does not help his pain. Oxycodone  has given him relief in the past, but he has not taken that in a month. He is here for evaluation and injection today.

## 2024-08-10 NOTE — Progress Notes (Signed)
 "  Paul Ray - 69 y.o. male MRN [id number]  Date of birth: 26-Nov-1955  Office Visit Note: Visit Date: 08/10/2024 PCP: Jerrell Cleatus Ned, MD Referred by: Jerrell Cleatus Ned, MD  Subjective: Chief Complaint  Patient presents with   Right Hip - Pain   HPI: Paul Ray is a pleasant 69 y.o. male who presents today for acute on chronic right hip pain.  Has a notable history of hip resurfacing arthroplasties of both hips done at outside facilities.  Over the last 3 months he has had pain within the hip more so anteriorly that radiates into the groin as well as a bit laterally. says that his activity has significantly decreased due to his pain. He previously went to the gym 5x/week, where he would use the stationary bike and then do banded work. He now does 5 minutes on the stationary bike only. He would like to get back to his activity. Patient says that Aleve as needed does not help his pain. Oxycodone  has given him relief in the past, but he has not taken that in a month   Pertinent ROS were reviewed with the patient and found to be negative unless otherwise specified above in HPI.   Assessment & Plan: Visit Diagnoses:  1. Chronic right hip pain   2. Chronic hip pain after total replacement of right hip joint    Plan: Impression is at least 43-month history of right hip pain in the setting of previous hip resurfacing arthroplasties years ago.  Previously saw Dr. Vernetta and had rigorous workup including x-rays, MRI, 3-phase bone scan within normal limits.  Bedside ultrasound shows mild thickening and a small degree of bursal fluid joint recess between this and the iliopsoas tendon.  This injected today but attention made to stay away from the hip arthroplasty itself.  Patient noted immediate relief just minutes after the injection from the anesthetic portion.  Advised on postinjection protocol.  May use ice/heat and/or over-the-counter anti-inflammatories in the short-term.  After 48  hours of modified rest, he may start getting back into activity and will see over the next few weeks the degree of relief 0-100%.  He will update/follow-up with Dr. Vernetta to discuss this and the next steps.  Follow-up: Return for F/u update Dr. Vernetta .   Meds & Orders: No orders of the defined types were placed in this encounter.   Orders Placed This Encounter  Procedures   Large Joint Inj: R hip joint   US  Guided Needle Placement - No Linked Charges     Procedures: Large Joint Inj: R hip joint on 08/10/2024 8:53 AM Indications: pain Details: 22 G 3.5 in needle, ultrasound-guided anterior approach Medications: 4 mL lidocaine  1 %; 80 mg methylPREDNISolone  acetate 40 MG/ML Outcome: tolerated well, no immediate complications  Procedure: US -guided intra-articular/psoas bursal hip injection, right After discussion on risks/benefits/indications and informed verbal consent was obtained, a timeout was performed. Patient was lying supine on exam table. The hip was cleaned with chloraprep and alcohol swabs. Then utilizing ultrasound guidance, the patient's femoral head and neck junction was identified, resurface arthroplasty was visualized. Between this and the iliopsoas bursa there was a small fluid collection indicative of mild effusion. This was targeted with US  and subsequently injected with 4:2 lidocaine :depomedrol via an in-plane approach with ultrasound visualization of the injectate administered into the hip joint. Patient tolerated procedure well without immediate complications.  Procedure, treatment alternatives, risks and benefits explained, specific risks discussed. Consent was given by the  patient. Immediately prior to procedure a time out was called to verify the correct patient, procedure, equipment, support staff and site/side marked as required. Patient was prepped and draped in the usual sterile fashion.          Clinical History:   He reports that he has never smoked. He  has never used smokeless tobacco.  Recent Labs    12/17/23 0909  HGBA1C 5.5    Objective:    Physical Exam  Gen: Well-appearing, in no acute distress; non-toxic CV: Well-perfused. Warm.  Resp: Breathing unlabored on room air; no wheezing. Psych: Fluid speech in conversation; appropriate affect; normal thought process  Ortho Exam - Right hip: No redness swelling or effusion.  He does have pain palpating over the anterior hip flexors to the psoas.  There is FADIR and logroll with internal and external rotation, firm endpoint with FADIR, FABER testing does reproduce some pain pointing more to the anterior lateral hip but not at the greater trochanter. + Stinchfield test.  Imaging:  *I did review the MRI during the visit today.  Narrative & Impression  CLINICAL DATA:  History of right hip replacement. Pain. Loosening is suspected.   EXAM: MRI OF THE RIGHT HIP WITHOUT CONTRAST   TECHNIQUE: Multiplanar, multisequence MR imaging of the right hip was performed. No intravenous contrast was administered.   COMPARISON:  Plain films right hip 06/29/2024.   FINDINGS: Bones/Joint/Cartilage   There is fairly extensive artifact on the exam despite using artifact reduction techniques.   Bilateral hip arthroplasties are in place. No evidence of loosening or other hardware complication is identified. No fracture, stress change or focal lesion is seen. There is no joint effusion or periarticular fluid collection.   Ligaments   Intact.   Muscles and Tendons   Intact.  No atrophy or focal lesion.   Soft tissues   None.   IMPRESSION: Bilateral hip arthroplasties in place. No evidence of loosening or other hardware complication is identified. The exam is otherwise negative.     Electronically Signed   By: Debby Prader M.D.   On: 07/27/2024 10:29   Narrative & Impression  CLINICAL DATA:  R groin pain, R hip pain. r/o hernia, groin cause of hip pain.   EXAM: CT  ABDOMEN AND PELVIS WITHOUT CONTRAST   TECHNIQUE: Multidetector CT imaging of the abdomen and pelvis was performed following the standard protocol without IV contrast.   RADIATION DOSE REDUCTION: This exam was performed according to the departmental dose-optimization program which includes automated exposure control, adjustment of the mA and/or kV according to patient size and/or use of iterative reconstruction technique.   COMPARISON:  CT scan abdomen pelvis from 04/02/2022.   FINDINGS: Lower chest: There are subpleural atelectatic changes in the visualized lung bases. No overt consolidation. No pleural effusion. The heart is normal in size. No pericardial effusion.   Hepatobiliary: The liver is normal in size. Non-cirrhotic configuration. No suspicious mass. No intrahepatic or extrahepatic bile duct dilation. No calcified gallstones. Normal gallbladder wall thickness. No pericholecystic inflammatory changes.   Pancreas: Unremarkable. No pancreatic ductal dilatation or surrounding inflammatory changes.   Spleen: Within normal limits. No focal lesion.   Adrenals/Urinary Tract: Adrenal glands are unremarkable. No suspicious renal mass within the limitations of this unenhanced exam. No nephroureterolithiasis or obstructive uropathy. There are at least 2 simple cysts in the right kidney with largest arising from the lower pole, medially measuring up to 1.7 x 1.9 cm. Evaluation of urinary bladder is markedly limited  due to extensive streak artifacts from bilateral hip arthroplasty hardware.   Stomach/Bowel: No disproportionate dilation of the small or large bowel loops. No evidence of abnormal bowel wall thickening or inflammatory changes. Very short length normal-appearing appendix noted on series 5, image 72. There is moderate to large stool burden. There is a small sliding hiatal hernia.   Vascular/Lymphatic: No ascites or pneumoperitoneum. No abdominal or pelvic  lymphadenopathy, by size criteria. No aneurysmal dilation of the major abdominal arteries. There are mild peripheral atherosclerotic vascular calcifications of the aorta and its major branches.   Reproductive: The prostate is not well evaluated due to extensive streak artifacts from bilateral hip arthroplasty hardware. However, in visualized portion appears within normal limits.   Other: There is a tiny fat containing umbilical hernia. The soft tissues and abdominal wall are otherwise unremarkable. Specifically, no inguinal or femoral hernia noted.   Musculoskeletal: No suspicious osseous lesions. There are mild multilevel degenerative changes in the visualized spine. Bilateral hip arthroplasty noted.   IMPRESSION: 1. No acute inflammatory process identified within the abdomen or pelvis. No hernia. 2. Multiple other nonacute observations, as described above.   Aortic Atherosclerosis (ICD10-I70.0).     Electronically Signed   By: Ree Molt M.D.   On: 08/02/2024 13:48    Past Medical/Family/Surgical/Social History: Medications & Allergies reviewed per EMR, new medications updated. Patient Active Problem List   Diagnosis Date Noted   Tinea pedis 12/17/2023   Presbycusis 12/17/2023   Foraminal stenosis of cervical region 03/08/2021   Ascending aortic aneurysm    Benign essential HTN    Chronic right shoulder pain 05/04/2017   Chronic hip pain after total replacement of right hip joint 12/02/2016   Chronic low back pain 08/16/2013   Bipolar depression (HCC)    ADD (attention deficit disorder)    Past Medical History:  Diagnosis Date   Abscess of lung (HCC)    Right lower lobe  IMO SNOMED Dx Update Oct 2024     Abscess of lung(513.0)    Right lower lobe   ADD (attention deficit disorder)    Anxiety    Arthritis    Ascending aortic aneurysm    47mm by Chest CTA 05/2020 and 48mm by echo 05/2020   Benign essential HTN    Bipolar depression (HCC)    Coronary  artery calcification seen on CAT scan    Empyema lung (HCC)    Hemorrhoids    Hepatitis C    History of lumbar laminectomy for spinal cord decompression 01/08/2022   History of total hip arthroplasty 04/23/2011   Pneumonia    last year   Prostate abscess    Staph infection    Viral pericarditis    after COVID 19 infection   Family History  Problem Relation Age of Onset   COPD Mother    Heart disease Mother    Hypertension Mother    Coronary artery disease Father    Dementia Father    Heart failure Father    Schizophrenia Brother    Breast cancer Maternal Grandmother    Prostate cancer Paternal Grandfather        also had bone cancer   Cancer Paternal Grandmother        unsure what kind   Past Surgical History:  Procedure Laterality Date   ELBOW SURGERY  2005   Right   FOOT NEUROMA SURGERY  2006   Left   HIP RESURFACING     left hip at [facility] 01/2011  LUMBAR LAMINECTOMY/DECOMPRESSION MICRODISCECTOMY  07/24/2011   Procedure: LUMBAR LAMINECTOMY/DECOMPRESSION MICRODISCECTOMY;  Surgeon: Alm GORMAN Molt;  Location: [facility];  Service: Neurosurgery;  Laterality: Bilateral;  Bilateral  , Lumbar Three-Four, Lumbar Four-Five Decompressive Laminectomy Rm # 32   LUNG SURGERY     for empyema   SHOULDER ARTHROSCOPY Right 07/05/2013   Procedure: RIGHT SHOULDER ARTHROSCOPY WITH DEBRIDEMENT;  Surgeon: Lonni CINDERELLA Poli, MD;  Location: [facility] OR;  Service: Orthopedics;  Laterality: Right;   TENDON TRANSFER Left 01/26/2014   Procedure: LEFT THUMB TRAPEZIECTOMY KNOTTED TENDON INTRAPOSITION TRANSFER OF ABDUCTOR POLLICIS LONGUS TO THENARS;  Surgeon: Lamar Leonor Mickey LULLA, MD;  Location: [facility];  Service: Orthopedics;  Laterality: Left;   THORACOTOMY  November 14 2010   rt   Social History   Occupational History   Occupation: music therapist  Tobacco Use   Smoking status: Never   Smokeless tobacco: Never  Vaping Use   Vaping status: Never Used  Substance and Sexual Activity    Alcohol use: No    Comment: quit drinking in 1989   Drug use: Not Currently    Types: Hydrocodone     Comment: Prior heavy use of opioid pain medications up until 2 years ago   Sexual activity: Yes   "

## 2024-08-11 ENCOUNTER — Telehealth: Payer: Self-pay | Admitting: Orthopaedic Surgery

## 2024-08-11 NOTE — Telephone Encounter (Signed)
 Patient called. Says [facility] did not receive his referral.

## 2024-08-12 ENCOUNTER — Telehealth: Payer: Self-pay | Admitting: Orthopaedic Surgery

## 2024-08-12 NOTE — Telephone Encounter (Signed)
 Faxed to [phone]

## 2024-08-12 NOTE — Telephone Encounter (Signed)
 Pt wants to be referred to a Orthopedic provider who can do hip replacement. Pt states he was told by Dr. Vernetta that he could do surgery on a hip that has been resurfaced.

## 2024-08-16 ENCOUNTER — Other Ambulatory Visit (HOSPITAL_BASED_OUTPATIENT_CLINIC_OR_DEPARTMENT_OTHER): Payer: Self-pay

## 2024-08-16 ENCOUNTER — Telehealth: Payer: Self-pay | Admitting: Orthopaedic Surgery

## 2024-08-16 DIAGNOSIS — M25551 Pain in right hip: Secondary | ICD-10-CM

## 2024-08-16 DIAGNOSIS — G8929 Other chronic pain: Secondary | ICD-10-CM

## 2024-08-16 NOTE — Telephone Encounter (Signed)
 Done

## 2024-08-16 NOTE — Telephone Encounter (Signed)
 Referral placed

## 2024-08-16 NOTE — Telephone Encounter (Signed)
 Pt called stating [facility] sent referral for pain management for Dr Bonner. They denied him and asking for Vernetta to sent referral to another pain management clinic. Pt asking for a call from [facility] at [phone].

## 2024-08-18 ENCOUNTER — Other Ambulatory Visit: Payer: Self-pay | Admitting: Orthopaedic Surgery

## 2024-08-18 ENCOUNTER — Telehealth: Payer: Self-pay | Admitting: Orthopaedic Surgery

## 2024-08-18 MED ORDER — OXYCODONE HCL 5 MG PO TABS: 5.0000 mg | ORAL_TABLET | Freq: Three times a day (TID) | ORAL | 0 refills | Status: AC | PRN

## 2024-08-18 NOTE — Telephone Encounter (Signed)
 Pt state the pain medication he was given by Dr Vernetta is not working and he needs something stronger

## 2024-08-19 ENCOUNTER — Other Ambulatory Visit: Payer: Self-pay | Admitting: Orthopaedic Surgery

## 2024-08-23 ENCOUNTER — Telehealth: Payer: Self-pay | Admitting: Behavioral Health

## 2024-08-23 NOTE — Telephone Encounter (Signed)
 Paul Ray called asking for a refill on his xanax  and his adderall 30 mg. Pharmacy is [facility] [address]

## 2024-08-24 ENCOUNTER — Other Ambulatory Visit: Payer: Self-pay

## 2024-08-24 DIAGNOSIS — F331 Major depressive disorder, recurrent, moderate: Secondary | ICD-10-CM

## 2024-08-24 DIAGNOSIS — F411 Generalized anxiety disorder: Secondary | ICD-10-CM

## 2024-08-24 DIAGNOSIS — G479 Sleep disorder, unspecified: Secondary | ICD-10-CM

## 2024-08-24 DIAGNOSIS — F3160 Bipolar disorder, current episode mixed, unspecified: Secondary | ICD-10-CM

## 2024-08-24 MED ORDER — ALPRAZOLAM 1 MG PO TABS: 1.0000 mg | ORAL_TABLET | Freq: Three times a day (TID) | ORAL | 0 refills | Status: AC | PRN

## 2024-08-24 MED ORDER — AMPHETAMINE-DEXTROAMPHET ER 30 MG PO CP24: ORAL_CAPSULE | ORAL | 0 refills | Status: AC

## 2024-08-24 NOTE — Telephone Encounter (Signed)
 Pt already has a RF available for 20 mg Adderall. Pended Adderall ER 30 and alprazolam  for fill 1/30.

## 2024-08-25 ENCOUNTER — Telehealth: Payer: Self-pay | Admitting: Orthopaedic Surgery

## 2024-08-25 NOTE — Telephone Encounter (Signed)
 Pt need a copy of xray of last visit with Vernetta. Please call pt when ready for pick up at [phone].

## 2024-08-25 NOTE — Telephone Encounter (Signed)
 Patient called. Says the gabapentin  does not work for him. He would like a call back. [phone]

## 2024-08-30 ENCOUNTER — Ambulatory Visit: Payer: Self-pay

## 2024-08-31 ENCOUNTER — Ambulatory Visit: Admitting: Student in an Organized Health Care Education/Training Program

## 2024-08-31 ENCOUNTER — Ambulatory Visit: Payer: Self-pay

## 2024-08-31 NOTE — Telephone Encounter (Signed)
 Patient was advise to r/s tomorrows appt due to provider being out of office. If patient calls back, please schedule acute visit

## 2024-08-31 NOTE — Telephone Encounter (Signed)
 FYI

## 2024-08-31 NOTE — Telephone Encounter (Signed)
 Paige PAS transferred this pt over to me stating the pt needed assistance rescheduling an appt. The pt disconnected. This RN called the pt back. The pt spoke in a loud, disgruntled voice with this RN. Pt stated I am on hold with the office and disconnected the call.       Copied from CRM #[id number]. Topic: General - Other >> Aug 31, 2024  8:44 AM Carlyon D wrote: Reason for CRM: pt calling was NT yesterday and wants to see if there is a sooner apt than tomorrow? Advised to get pt back over to NT to reschedule.

## 2024-08-31 NOTE — Telephone Encounter (Signed)
 Pt calling back to r/s appt that was made for today at 0940 by NT, pt calling back now stating he has another appt at 1030 today and unable to get that office on phone so he wanted to r/s his appt back to tomorrow at 340. 09/01/24 340 was blocked off d/t provider so I called CAL spoke with [facility] regarding 340 appt, she states that Dr. Jerrell was going to be out tomorrow and he wouldn't be able to r/s for tomorrow. She advised that he would have to r/s to another day/time or provider. I advised this for pt and he got upset and hateful. Asking why he couldn't have his appt for tomorrow at 340 that he had just called to r/s 30 mins ago. I advised him that Dr. Jerrell was going to be out tomorrow and he wouldn't be seeing pts. Pt kept repeating that he wanted his 340 appt back. I informed pt that he would have to r/s for another day/time or different provider. Pt states he would call back, I asked if he wanted me to cancel the 0940 for today x 2 and he finally said yes and that he would call back and disconnected.

## 2024-08-31 NOTE — Telephone Encounter (Signed)
 Patient calling in stating hip pain is worse today. 10/10 currently. No fever. Able to stand and walk but is painful, anytime I'm on my feet they hurt. No pain medication taken this AM, due to nothing works. RN rescheduled patient's appointment tomorrow to appointment this AM with PCP.

## 2024-09-01 ENCOUNTER — Ambulatory Visit: Admitting: Student in an Organized Health Care Education/Training Program

## 2024-09-01 NOTE — Telephone Encounter (Signed)
 Patient became upset during the scheduling conversation and directed inappropriate behavior toward our office staff when his requested appointment time was no longer available.  I do not believe this incident alone warrants dismissal from the practice. However, it does warrant a formal written warning to clearly outline our expectations for respectful communication with all members of the care team.  Please sendthe patient a warning letter that acknowledges the interaction, states our expectation of respectful behavior toward staff, and clarifies that future occurrences may result in termination of the physician-patient relationship. Please place a copy of the letter in the medical record.

## 2024-09-14 ENCOUNTER — Ambulatory Visit: Payer: Self-pay | Admitting: Behavioral Health

## 2024-09-22 ENCOUNTER — Ambulatory Visit: Admitting: Student in an Organized Health Care Education/Training Program
